# Patient Record
Sex: Female | Born: 1955 | Race: Black or African American | Hispanic: No | Marital: Married | Smoking: Former smoker
Health system: Southern US, Community
[De-identification: ages and names within clinical notes are randomized; demographics above are authoritative.]

## PROBLEM LIST (undated history)

## (undated) DIAGNOSIS — I10 Essential (primary) hypertension: Secondary | ICD-10-CM

## (undated) DIAGNOSIS — H8109 Meniere's disease, unspecified ear: Secondary | ICD-10-CM

## (undated) DIAGNOSIS — J449 Chronic obstructive pulmonary disease, unspecified: Secondary | ICD-10-CM

## (undated) DIAGNOSIS — G4733 Obstructive sleep apnea (adult) (pediatric): Secondary | ICD-10-CM

## (undated) DIAGNOSIS — I4892 Unspecified atrial flutter: Secondary | ICD-10-CM

## (undated) DIAGNOSIS — G4730 Sleep apnea, unspecified: Secondary | ICD-10-CM

## (undated) DIAGNOSIS — E119 Type 2 diabetes mellitus without complications: Secondary | ICD-10-CM

## (undated) DIAGNOSIS — Z860100 Personal history of colon polyps, unspecified: Secondary | ICD-10-CM

## (undated) DIAGNOSIS — C649 Malignant neoplasm of unspecified kidney, except renal pelvis: Secondary | ICD-10-CM

## (undated) DIAGNOSIS — K219 Gastro-esophageal reflux disease without esophagitis: Secondary | ICD-10-CM

## (undated) DIAGNOSIS — J45909 Unspecified asthma, uncomplicated: Secondary | ICD-10-CM

## (undated) HISTORY — PX: BARIATRIC SURGERY: SHX1103

## (undated) HISTORY — DX: Unspecified atrial flutter: I48.92

## (undated) HISTORY — PX: ABDOMINAL HYSTERECTOMY: SHX81

## (undated) HISTORY — PX: CHOLECYSTECTOMY: SHX55

## (undated) HISTORY — DX: Obstructive sleep apnea (adult) (pediatric): G47.33

---

## 2016-08-08 ENCOUNTER — Other Ambulatory Visit: Payer: Self-pay | Admitting: Nurse Practitioner

## 2016-08-08 ENCOUNTER — Ambulatory Visit
Admission: RE | Admit: 2016-08-08 | Discharge: 2016-08-08 | Disposition: A | Discharge status: Home / Self Care | Payer: Medicare Other | Source: Ambulatory Visit | Attending: Nurse Practitioner | Admitting: Nurse Practitioner

## 2016-08-08 DIAGNOSIS — M25561 Pain in right knee: Secondary | ICD-10-CM

## 2016-09-04 ENCOUNTER — Ambulatory Visit: Payer: Medicare Other | Attending: Neurology

## 2016-09-04 DIAGNOSIS — Z79899 Other long term (current) drug therapy: Secondary | ICD-10-CM | POA: Insufficient documentation

## 2016-09-04 DIAGNOSIS — G4733 Obstructive sleep apnea (adult) (pediatric): Secondary | ICD-10-CM | POA: Insufficient documentation

## 2016-09-04 DIAGNOSIS — R0683 Snoring: Secondary | ICD-10-CM | POA: Diagnosis present

## 2016-09-04 DIAGNOSIS — I10 Essential (primary) hypertension: Secondary | ICD-10-CM | POA: Diagnosis not present

## 2016-10-14 ENCOUNTER — Ambulatory Visit: Payer: Self-pay | Admitting: Unknown Physician Specialty

## 2016-12-10 ENCOUNTER — Encounter: Payer: Self-pay | Admitting: *Deleted

## 2016-12-11 ENCOUNTER — Encounter: Payer: Self-pay | Admitting: *Deleted

## 2016-12-11 ENCOUNTER — Ambulatory Visit
Admission: RE | Admit: 2016-12-11 | Discharge: 2016-12-11 | Disposition: A | Discharge status: Home / Self Care | Payer: Medicare Other | Source: Ambulatory Visit | Attending: Unknown Physician Specialty | Admitting: Unknown Physician Specialty

## 2016-12-11 ENCOUNTER — Ambulatory Visit: Payer: Medicare Other | Admitting: Anesthesiology

## 2016-12-11 ENCOUNTER — Encounter
Admission: RE | Disposition: A | Discharge status: Home / Self Care | Payer: Self-pay | Source: Ambulatory Visit | Attending: Unknown Physician Specialty

## 2016-12-11 DIAGNOSIS — D125 Benign neoplasm of sigmoid colon: Secondary | ICD-10-CM | POA: Insufficient documentation

## 2016-12-11 DIAGNOSIS — Z6841 Body Mass Index (BMI) 40.0 and over, adult: Secondary | ICD-10-CM | POA: Insufficient documentation

## 2016-12-11 DIAGNOSIS — I10 Essential (primary) hypertension: Secondary | ICD-10-CM | POA: Diagnosis not present

## 2016-12-11 DIAGNOSIS — D123 Benign neoplasm of transverse colon: Secondary | ICD-10-CM | POA: Insufficient documentation

## 2016-12-11 DIAGNOSIS — Z1211 Encounter for screening for malignant neoplasm of colon: Secondary | ICD-10-CM | POA: Diagnosis not present

## 2016-12-11 DIAGNOSIS — J45909 Unspecified asthma, uncomplicated: Secondary | ICD-10-CM | POA: Insufficient documentation

## 2016-12-11 DIAGNOSIS — K648 Other hemorrhoids: Secondary | ICD-10-CM | POA: Insufficient documentation

## 2016-12-11 DIAGNOSIS — K219 Gastro-esophageal reflux disease without esophagitis: Secondary | ICD-10-CM | POA: Insufficient documentation

## 2016-12-11 DIAGNOSIS — Z7951 Long term (current) use of inhaled steroids: Secondary | ICD-10-CM | POA: Insufficient documentation

## 2016-12-11 DIAGNOSIS — Z86010 Personal history of colonic polyps: Secondary | ICD-10-CM | POA: Insufficient documentation

## 2016-12-11 DIAGNOSIS — E669 Obesity, unspecified: Secondary | ICD-10-CM | POA: Insufficient documentation

## 2016-12-11 DIAGNOSIS — G4730 Sleep apnea, unspecified: Secondary | ICD-10-CM | POA: Diagnosis not present

## 2016-12-11 DIAGNOSIS — Z87891 Personal history of nicotine dependence: Secondary | ICD-10-CM | POA: Insufficient documentation

## 2016-12-11 DIAGNOSIS — Z79899 Other long term (current) drug therapy: Secondary | ICD-10-CM | POA: Diagnosis not present

## 2016-12-11 HISTORY — DX: Essential (primary) hypertension: I10

## 2016-12-11 HISTORY — DX: Personal history of colonic polyps: Z86.010

## 2016-12-11 HISTORY — DX: Meniere's disease, unspecified ear: H81.09

## 2016-12-11 HISTORY — DX: Sleep apnea, unspecified: G47.30

## 2016-12-11 HISTORY — DX: Personal history of colon polyps, unspecified: Z86.0100

## 2016-12-11 HISTORY — DX: Unspecified asthma, uncomplicated: J45.909

## 2016-12-11 HISTORY — DX: Gastro-esophageal reflux disease without esophagitis: K21.9

## 2016-12-11 HISTORY — PX: COLONOSCOPY WITH PROPOFOL: SHX5780

## 2016-12-11 SURGERY — COLONOSCOPY WITH PROPOFOL
Anesthesia: General

## 2016-12-11 MED ORDER — PROPOFOL 10 MG/ML IV BOLUS
INTRAVENOUS | Status: AC
  Filled 2016-12-11: qty 20

## 2016-12-11 MED ORDER — ONDANSETRON HCL 4 MG/2ML IJ SOLN
INTRAMUSCULAR | Status: DC | PRN
  Administered 2016-12-11: 4 mg via INTRAVENOUS

## 2016-12-11 MED ORDER — GLYCOPYRROLATE 0.2 MG/ML IJ SOLN
INTRAMUSCULAR | Status: AC
  Filled 2016-12-11: qty 1

## 2016-12-11 MED ORDER — PROPOFOL 500 MG/50ML IV EMUL
INTRAVENOUS | Status: AC
  Filled 2016-12-11: qty 50

## 2016-12-11 MED ORDER — SODIUM CHLORIDE 0.9 % IV SOLN: INTRAVENOUS | Status: DC

## 2016-12-11 MED ORDER — GLYCOPYRROLATE 0.2 MG/ML IV SOSY
PREFILLED_SYRINGE | INTRAVENOUS | Status: DC | PRN
  Administered 2016-12-11 (×2): .1 mg via INTRAVENOUS

## 2016-12-11 MED ORDER — SODIUM CHLORIDE 0.9 % IV SOLN
INTRAVENOUS | Status: DC
  Administered 2016-12-11: 14:00:00 via INTRAVENOUS

## 2016-12-11 MED ORDER — PROPOFOL 500 MG/50ML IV EMUL
INTRAVENOUS | Status: DC | PRN
  Administered 2016-12-11: 150 ug/kg/min via INTRAVENOUS

## 2016-12-11 MED ORDER — ONDANSETRON HCL 4 MG/2ML IJ SOLN
INTRAMUSCULAR | Status: AC
  Filled 2016-12-11: qty 2

## 2016-12-11 MED ORDER — PROPOFOL 10 MG/ML IV BOLUS
INTRAVENOUS | Status: DC | PRN
  Administered 2016-12-11: 70 mg via INTRAVENOUS
  Administered 2016-12-11: 30 mg via INTRAVENOUS
  Administered 2016-12-11 (×4): 20 mg via INTRAVENOUS
  Administered 2016-12-11 (×2): 30 mg via INTRAVENOUS
  Administered 2016-12-11: 10 mg via INTRAVENOUS
  Administered 2016-12-11: 30 mg via INTRAVENOUS

## 2016-12-11 NOTE — H&P (Signed)
Primary Care Physician:  Elisabeth Cara, NP Primary Gastroenterologist:  Dr. Vira Agar  Pre-Procedure History & Physical: HPI:  Alyssa Leonard is a 61 y.o. female is here for an colonoscopy.   Past Medical History:  Diagnosis Date  . Asthma   . GERD (gastroesophageal reflux disease)   . History of colon polyps   . Hypertension   . Meniere disease   . Sleep apnea    Went for sleep study test in January 2018 "I haven't heard back from test"    Past Surgical History:  Procedure Laterality Date  . ABDOMINAL HYSTERECTOMY    . CHOLECYSTECTOMY      Prior to Admission medications   Medication Sig Start Date End Date Taking? Authorizing Provider  albuterol (PROVENTIL HFA;VENTOLIN HFA) 108 (90 Base) MCG/ACT inhaler Inhale 2 puffs into the lungs every 6 (six) hours as needed for wheezing or shortness of breath.   Yes Historical Provider, MD  amLODipine (NORVASC) 5 MG tablet Take 5 mg by mouth daily.   Yes Historical Provider, MD  losartan-hydrochlorothiazide (HYZAAR) 50-12.5 MG tablet Take 1 tablet by mouth daily.   Yes Historical Provider, MD  meclizine (ANTIVERT) 25 MG tablet Take 25 mg by mouth 3 (three) times daily as needed for dizziness.   Yes Historical Provider, MD  meloxicam (MOBIC) 7.5 MG tablet Take 7.5 mg by mouth daily.   Yes Historical Provider, MD  metoprolol succinate (TOPROL-XL) 25 MG 24 hr tablet Take 25 mg by mouth daily.   Yes Historical Provider, MD  ondansetron (ZOFRAN) 4 MG tablet Take 4 mg by mouth every 8 (eight) hours as needed for nausea or vomiting.   Yes Historical Provider, MD  pantoprazole (PROTONIX) 40 MG tablet Take 40 mg by mouth daily.   Yes Historical Provider, MD    Allergies as of 11/22/2016 - never reviewed  Allergen Reaction Noted  . Aspirin Other (See Comments) 08/07/2016    Family History  Problem Relation Age of Onset  . Hypertension Mother   . Kidney disease Sister   . Kidney disease Brother   . Heart disease Brother   . Heart disease  Brother     Social History   Social History  . Marital status: Single    Spouse name: N/A  . Number of children: N/A  . Years of education: N/A   Occupational History  . Not on file.   Social History Main Topics  . Smoking status: Former Research scientist (life sciences)  . Smokeless tobacco: Never Used  . Alcohol use No  . Drug use: No  . Sexual activity: Not on file   Other Topics Concern  . Not on file   Social History Narrative  . No narrative on file    Review of Systems: See HPI, otherwise negative ROS  Physical Exam: BP (!) 157/95   Pulse 83   Temp 97 F (36.1 C) (Tympanic)   Resp 18   Ht 5' (1.524 m)   Wt (!) 149.7 kg (330 lb)   SpO2 100%   BMI 64.45 kg/m  General:   Alert,  pleasant and cooperative in NAD Head:  Normocephalic and atraumatic. Neck:  Supple; no masses or thyromegaly. Lungs:  Clear throughout to auscultation.    Heart:  Regular rate and rhythm. Abdomen:  Soft, nontender and nondistended. Normal bowel sounds, without guarding, and without rebound.   Neurologic:  Alert and  oriented x4;  grossly normal neurologically.  Impression/Plan: Alyssa Leonard is here for an colonoscopy to be performed  for [facility] colon polyps  Risks, benefits, limitations, and alternatives regarding  colonoscopy have been reviewed with the patient.  Questions have been answered.  All parties agreeable.   Gaylyn Cheers, MD  12/11/2016, 2:56 PM

## 2016-12-11 NOTE — Anesthesia Preprocedure Evaluation (Addendum)
Anesthesia Evaluation  Patient identified by MRN, date of birth, ID band Patient awake    Reviewed: Allergy & Precautions, NPO status , Patient's Chart, lab work & pertinent test results, reviewed documented beta blocker date and time   Airway Mallampati: II  TM Distance: >3 FB     Dental  (+) Chipped, Caps   Pulmonary asthma , sleep apnea , former smoker,    Pulmonary exam normal        Cardiovascular hypertension, Pt. on medications and Pt. on home beta blockers Normal cardiovascular exam     Neuro/Psych negative psych ROS   GI/Hepatic Neg liver ROS, GERD  Medicated and Controlled,  Endo/Other  negative endocrine ROS  Renal/GU negative Renal ROS  negative genitourinary   Musculoskeletal negative musculoskeletal ROS (+)   Abdominal (+) + obese,   Peds negative pediatric ROS (+)  Hematology negative hematology ROS (+)   Anesthesia Other Findings   Reproductive/Obstetrics negative OB ROS                            Anesthesia Physical Anesthesia Plan  ASA: III  Anesthesia Plan: General   Post-op Pain Management:    Induction: Intravenous  Airway Management Planned: Nasal Cannula  Additional Equipment:   Intra-op Plan:   Post-operative Plan:   Informed Consent: I have reviewed the patients History and Physical, chart, labs and discussed the procedure including the risks, benefits and alternatives for the proposed anesthesia with the patient or authorized representative who has indicated his/her understanding and acceptance.   Dental advisory given  Plan Discussed with: CRNA and Surgeon  Anesthesia Plan Comments:         Anesthesia Quick Evaluation

## 2016-12-11 NOTE — Transfer of Care (Signed)
Immediate Anesthesia Transfer of Care Note  Patient: Alyssa Leonard  Procedure(s) Performed: Procedure(s): COLONOSCOPY WITH PROPOFOL (N/A)  Patient Location: PACU  Anesthesia Type:General  Level of Consciousness: awake, alert  and oriented  Airway & Oxygen Therapy: Patient Spontanous Breathing and Patient connected to nasal cannula oxygen  Post-op Assessment: Report given to RN and Post -op Vital signs reviewed and stable  Post vital signs: Reviewed and stable  Last Vitals:  Vitals:   12/11/16 1355 12/11/16 1538  BP: (!) 157/95 120/77  Pulse: 83 (!) 107  Resp: 18 19  Temp: 36.1 C (!) 35.9 C    Last Pain:  Vitals:   12/11/16 1538  TempSrc: Tympanic  PainSc:          Complications: No apparent anesthesia complications

## 2016-12-11 NOTE — Op Note (Signed)
[facility] Patient Name: Alyssa Leonard Procedure Date: 12/11/2016 2:50 PM MRN: [id number] Account #: [id number] Date of Birth: Mar 12, 1956 Admit Type: Outpatient Age: 61 Room: [facility] ENDO ROOM 4 Gender: Female Note Status: Finalized Procedure:            Colonoscopy Indications:          High risk colon cancer surveillance: Personal history                        of colonic polyps Providers:            Manya Silvas, MD Medicines:            Propofol per Anesthesia Complications:        No immediate complications. Procedure:            Pre-Anesthesia Assessment:                       - After reviewing the risks and benefits, the patient                        was deemed in satisfactory condition to undergo the                        procedure.                       After obtaining informed consent, the colonoscope was                        passed under direct vision. Throughout the procedure,                        the patient's blood pressure, pulse, and oxygen                        saturations were monitored continuously. The                        Colonoscope was introduced through the anus and                        advanced to the the cecum, identified by appendiceal                        orifice and ileocecal valve. The patient tolerated the                        procedure well. The quality of the bowel preparation                        was good. Findings:      A 15 mm polyp was found in the proximal transverse colon. The polyp was       pedunculated. The polyp was removed with a hot snare. Resection and       retrieval were complete. Two clips applied to the site.      A diminutive polyp was found in the recto-sigmoid colon. The polyp was       sessile. The polyp was removed with a jumbo cold forceps. Resection and       retrieval were complete. Impression:           -  One 15 mm polyp in the proximal transverse colon,            removed with a hot snare. Resected and retrieved.                       - One diminutive polyp at the recto-sigmoid colon,                        removed with a jumbo cold forceps. Resected and                        retrieved. Recommendation:       - Await pathology results. Manya Silvas, MD 12/11/2016 3:37:59 PM This report has been signed electronically. Number of Addenda: 0 Note Initiated On: 12/11/2016 2:50 PM Scope Withdrawal Time: 0 hours 22 minutes 17 seconds  Total Procedure Duration: 0 hours 26 minutes 53 seconds       [facility]

## 2016-12-11 NOTE — Anesthesia Postprocedure Evaluation (Signed)
Anesthesia Post Note  Patient: Alyssa Leonard  Procedure(s) Performed: Procedure(s) (LRB): COLONOSCOPY WITH PROPOFOL (N/A)  Patient location during evaluation: Endoscopy Anesthesia Type: General Level of consciousness: awake and alert and oriented Pain management: pain level controlled Vital Signs Assessment: post-procedure vital signs reviewed and stable Respiratory status: spontaneous breathing, nonlabored ventilation and respiratory function stable Cardiovascular status: blood pressure returned to baseline and stable Postop Assessment: no signs of nausea or vomiting Anesthetic complications: no     Last Vitals:  Vitals:   12/11/16 1558 12/11/16 1608  BP: (!) 136/109 (!) 136/113  Pulse: (!) 104 98  Resp: 17 17  Temp:      Last Pain:  Vitals:   12/11/16 1538  TempSrc: Tympanic  PainSc:                  Sybol Morre

## 2016-12-11 NOTE — Anesthesia Post-op Follow-up Note (Cosign Needed)
Anesthesia QCDR form completed.        

## 2016-12-11 NOTE — Anesthesia Procedure Notes (Signed)
Date/Time: 12/11/2016 3:00 PM Performed by: Hedda Slade Pre-anesthesia Checklist: Patient identified, Emergency Drugs available, Suction available and Patient being monitored Patient Re-evaluated:Patient Re-evaluated prior to inductionOxygen Delivery Method: Nasal cannula

## 2016-12-12 ENCOUNTER — Encounter: Payer: Self-pay | Admitting: Unknown Physician Specialty

## 2016-12-13 LAB — SURGICAL PATHOLOGY

## 2017-05-20 ENCOUNTER — Emergency Department
Admission: EM | Admit: 2017-05-20 | Discharge: 2017-05-20 | Disposition: A | Discharge status: Home / Self Care | Payer: Medicare Other | Attending: Emergency Medicine | Admitting: Emergency Medicine

## 2017-05-20 ENCOUNTER — Encounter: Payer: Self-pay | Admitting: *Deleted

## 2017-05-20 ENCOUNTER — Emergency Department: Payer: Medicare Other

## 2017-05-20 DIAGNOSIS — M170 Bilateral primary osteoarthritis of knee: Secondary | ICD-10-CM | POA: Diagnosis not present

## 2017-05-20 DIAGNOSIS — G5603 Carpal tunnel syndrome, bilateral upper limbs: Secondary | ICD-10-CM | POA: Diagnosis not present

## 2017-05-20 DIAGNOSIS — R200 Anesthesia of skin: Secondary | ICD-10-CM | POA: Diagnosis present

## 2017-05-20 MED ORDER — PREDNISONE 20 MG PO TABS
60.0000 mg | ORAL_TABLET | Freq: Once | ORAL | Status: AC
  Administered 2017-05-20: 60 mg via ORAL
  Filled 2017-05-20: qty 3

## 2017-05-20 MED ORDER — PREDNISONE 10 MG PO TABS: ORAL_TABLET | ORAL | 0 refills | Status: DC

## 2017-05-20 NOTE — ED Provider Notes (Signed)
[facility] Emergency Department Provider Note  ____________________________________________  Time seen: Approximately 1:52 PM  I have reviewed the triage vital signs and the nursing notes.   HISTORY  Chief Complaint Knee Pain and Numbness    HPI Alyssa Leonard is a 61 y.o. female who presents to the emergency department with increased knee pain for 2 weeks and numbness and tingling in bilateral hands for one week.Patient has arthritis in both knees. Pain is worse in the left knee and right and is worse with walking. No trauma. She takes meloxicam for pain and did not take any today. Numbness and tingling in her fingers started 1 week ago. Pain is worse in her left hand than the right hand. It is worse with grabbing and handling objects. It wakes her up at night. 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 She denies neck pain, neck injury, shortness breath, chest pain, palpitations, nausea, vomiting, abdominal pain.   Past Medical History:  Diagnosis Date  . Asthma   . GERD (gastroesophageal reflux disease)   . History of colon polyps   . Hypertension   . Meniere disease   . Sleep apnea    Went for sleep study test in January 2018 "I haven't heard back from test"    There are no active problems to display for this patient.   Past Surgical History:  Procedure Laterality Date  . ABDOMINAL HYSTERECTOMY    . CHOLECYSTECTOMY    . COLONOSCOPY WITH PROPOFOL N/A 12/11/2016   Procedure: COLONOSCOPY WITH PROPOFOL;  Surgeon: Manya Silvas, MD;  Location: [facility] ENDOSCOPY;  Service: Endoscopy;  Laterality: N/A;    Prior to Admission medications   Medication Sig Start Date End Date Taking? Authorizing Provider  albuterol (PROVENTIL HFA;VENTOLIN HFA) 108 (90 Base) MCG/ACT inhaler Inhale 2 puffs into the lungs every 6 (six) hours as needed for wheezing or shortness of breath.    [provider]  amLODipine  (NORVASC) 5 MG tablet Take 5 mg by mouth daily.    [provider]  losartan-hydrochlorothiazide (HYZAAR) 50-12.5 MG tablet Take 1 tablet by mouth daily.    [provider]  meclizine (ANTIVERT) 25 MG tablet Take 25 mg by mouth 3 (three) times daily as needed for dizziness.    [provider]  meloxicam (MOBIC) 7.5 MG tablet Take 7.5 mg by mouth daily.    [provider]  metoprolol succinate (TOPROL-XL) 25 MG 24 hr tablet Take 25 mg by mouth daily.    [provider]  ondansetron (ZOFRAN) 4 MG tablet Take 4 mg by mouth every 8 (eight) hours as needed for nausea or vomiting.    [provider]  pantoprazole (PROTONIX) 40 MG tablet Take 40 mg by mouth daily.    [provider]  predniSONE (DELTASONE) 10 MG tablet Take 6 tablets on day 1, take 5 tablets on day 2, take 4 tablets on day 3, take 3 tablets on day 4, take 2 tablets on day 5, take 1 tablet on day 6 05/20/17   Alyssa Emperor, PA-C    Allergies Aspirin  Family History  Problem Relation Age of Onset  . Hypertension Mother   . Kidney disease Sister   . Kidney disease Brother   . Heart disease Brother   . Heart disease Brother     Social History Social History  Substance Use Topics  . Smoking status: Former Research scientist (life sciences)  . Smokeless tobacco:  Never Used  . Alcohol use No     Review of Systems  Constitutional: No fever/chills Cardiovascular: No chest pain. Respiratory: No SOB. Gastrointestinal: No abdominal pain.  No nausea, no vomiting.  Musculoskeletal: Positive for knee pain. Skin: Negative for rash, abrasions, lacerations, ecchymosis. Neurological: Negative for headaches. Positive for numbness and tingling in hands.   ____________________________________________   PHYSICAL EXAM:  VITAL SIGNS: ED Triage Vitals  Enc Vitals Group     BP 05/20/17 1321 (!) 144/88     Pulse Rate 05/20/17 1321 68     Resp 05/20/17 1321 (!) 46     Temp 05/20/17 1321 98.4 F  (36.9 C)     Temp Source 05/20/17 1321 Oral     SpO2 05/20/17 1321 96 %     Weight 05/20/17 1311 (!) 319 lb (144.7 kg)     Height 05/20/17 1311 5' (1.524 m)     Head Circumference --      Peak Flow --      Pain Score 05/20/17 1311 7     Pain Loc --      Pain Edu? --      Excl. in [place]? --      Constitutional: Alert and oriented. Well appearing and in no acute distress. Eyes: Conjunctivae are normal. PERRL. EOMI. Head: Atraumatic. ENT:      Ears:      Nose: No congestion/rhinnorhea.      Mouth/Throat: Mucous membranes are moist.  Neck: No stridor.   Cardiovascular: Normal rate, regular rhythm.  Good peripheral circulation. 2+ radial pulses. Respiratory: Normal respiratory effort without tachypnea or retractions. Lungs CTAB. Good air entry to the bases with no decreased or absent breath sounds. Musculoskeletal: Full range of motion to all extremities. No gross deformities appreciated. Positive Tinel's and Phalens. No obvious swelling of knees. No erythema. Diffuse tenderness to palpation over left knee. Negative anterior drawer, posterior drawer, valgus, varus, mcMurray, patella apprehension, apley grind. Neurologic:  Normal speech and language. No gross focal neurologic deficits are appreciated.  Skin:  Skin is warm, dry and intact. No rash noted.   ____________________________________________   LABS (all labs ordered are listed, but only abnormal results are displayed)  Labs Reviewed - No data to display ____________________________________________  EKG   ____________________________________________  RADIOLOGY Robinette Haines, personally viewed and evaluated these images (plain radiographs) as part of my medical decision making, as well as reviewing the written report by the radiologist.  Dg Knee Complete 4 Views Left  Result Date: 05/20/2017 CLINICAL DATA:  Chronic knee pain increasing over the last 2 weeks, initial encounter EXAM: LEFT KNEE - COMPLETE 4+ VIEW  COMPARISON:  None. FINDINGS: Degenerative changes of the left knee joint are noted. These changes are most prominent in the medial joint space. No sizable joint effusion is seen. No acute fracture or dislocation is noted. IMPRESSION: Degenerative changes without acute abnormality. Electronically Signed   By: Inez Catalina M.D.   On: 05/20/2017 14:12    ____________________________________________    PROCEDURES  Procedure(s) performed:    Procedures    Medications  predniSONE (DELTASONE) tablet 60 mg (60 mg Oral Given 05/20/17 1531)     ____________________________________________   INITIAL IMPRESSION / ASSESSMENT AND PLAN / ED COURSE  Pertinent labs & imaging results that were available during my care of the patient were reviewed by me and considered in my medical decision making (see chart for details).  Review of the [place] CSRS was performed in accordance of the [facility]  prior to dispensing any controlled drugs.   Patient's diagnosis is consistent with osteoarthritis and carpal tunnel. Vital signs and exam are reassuring. She has worsening bilateral knee pain. Left knee x-ray negative for acute bony abnormalities. It is consistent with osteoarthritis. No trauma. Patient is obese and would benefit from losing weight. Hand numbness and tingling is likely carpal tunnel. 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 She has not taken any of her medication today. Patient will be discharged home with prescriptions for prednisone. She is not diabetic. Patient is to follow up with orthopedics as directed. Patient is given ED precautions to return to the ED for any worsening or new symptoms.     ____________________________________________  FINAL CLINICAL IMPRESSION(S) / ED DIAGNOSES  Final diagnoses:  Bilateral carpal tunnel syndrome  Osteoarthritis of both knees, unspecified osteoarthritis type      NEW MEDICATIONS STARTED DURING THIS VISIT:  Discharge  Medication List as of 05/20/2017  3:20 PM    START taking these medications   Details  predniSONE (DELTASONE) 10 MG tablet Take 6 tablets on day 1, take 5 tablets on day 2, take 4 tablets on day 3, take 3 tablets on day 4, take 2 tablets on day 5, take 1 tablet on day 6, Print            This chart was dictated using voice recognition software/Dragon. Despite best efforts to proofread, errors can occur which can change the meaning. Any change was purely unintentional.    Alyssa Emperor, PA-C 05/20/17 1639    Lavonia Drafts, MD 06/03/17 [phone]

## 2017-05-20 NOTE — ED Triage Notes (Signed)
Pt reports having bilateral chronic knee pain from arthritis that has worsened over the past 2 weeks. Pt reports feeling as though knees are swollen. PT able to bear weight but reports increased pain when doing so. Movement intact. No injury reported.  Pt also reports having intermittent numbness in all finger tips for the past 2 weeks as well. Movement intact. Pulse strong, sensation intact. Pt denies pain or weakness.

## 2017-05-20 NOTE — ED Notes (Signed)
+  2 pulses bilateral feet. Takes maloxicam for pain bid. Denies injury. Able to bear weight.

## 2017-10-08 ENCOUNTER — Other Ambulatory Visit: Payer: Self-pay | Admitting: Nurse Practitioner

## 2017-10-08 DIAGNOSIS — Z1231 Encounter for screening mammogram for malignant neoplasm of breast: Secondary | ICD-10-CM

## 2017-11-10 ENCOUNTER — Ambulatory Visit
Admission: RE | Admit: 2017-11-10 | Discharge: 2017-11-10 | Disposition: A | Discharge status: Home / Self Care | Payer: Medicare HMO | Source: Ambulatory Visit | Attending: Nurse Practitioner | Admitting: Nurse Practitioner

## 2017-11-10 DIAGNOSIS — Z1231 Encounter for screening mammogram for malignant neoplasm of breast: Secondary | ICD-10-CM

## 2017-11-17 ENCOUNTER — Inpatient Hospital Stay
Admission: RE | Admit: 2017-11-17 | Discharge: 2017-11-17 | Disposition: A | Discharge status: Home / Self Care | Payer: Self-pay | Source: Ambulatory Visit | Attending: *Deleted | Admitting: *Deleted

## 2017-11-17 ENCOUNTER — Other Ambulatory Visit: Payer: Self-pay | Admitting: *Deleted

## 2017-11-17 DIAGNOSIS — Z9289 Personal history of other medical treatment: Secondary | ICD-10-CM

## 2017-12-05 DIAGNOSIS — Z6841 Body Mass Index (BMI) 40.0 and over, adult: Secondary | ICD-10-CM | POA: Insufficient documentation

## 2018-01-22 DIAGNOSIS — Z91040 Latex allergy status: Secondary | ICD-10-CM | POA: Insufficient documentation

## 2018-02-25 DIAGNOSIS — R7301 Impaired fasting glucose: Secondary | ICD-10-CM | POA: Insufficient documentation

## 2018-06-11 ENCOUNTER — Other Ambulatory Visit: Payer: Self-pay | Admitting: Ophthalmology

## 2018-06-11 DIAGNOSIS — H4720 Unspecified optic atrophy: Secondary | ICD-10-CM

## 2018-06-26 ENCOUNTER — Ambulatory Visit
Admission: RE | Admit: 2018-06-26 | Discharge: 2018-06-26 | Disposition: A | Discharge status: Home / Self Care | Payer: Medicare HMO | Source: Ambulatory Visit | Attending: Ophthalmology | Admitting: Ophthalmology

## 2018-06-26 DIAGNOSIS — H4720 Unspecified optic atrophy: Secondary | ICD-10-CM | POA: Insufficient documentation

## 2018-06-26 LAB — POCT I-STAT CREATININE: Creatinine, Ser: 1 mg/dL (ref 0.44–1.00)

## 2018-06-26 MED ORDER — GADOBENATE DIMEGLUMINE 529 MG/ML IV SOLN
20.0000 mL | Freq: Once | INTRAVENOUS | Status: AC | PRN
  Administered 2018-06-26: 20 mL via INTRAVENOUS

## 2018-07-21 DIAGNOSIS — H8113 Benign paroxysmal vertigo, bilateral: Secondary | ICD-10-CM | POA: Insufficient documentation

## 2018-08-07 ENCOUNTER — Ambulatory Visit: Payer: Medicare HMO

## 2018-08-11 ENCOUNTER — Ambulatory Visit: Payer: Medicare HMO | Admitting: Physical Therapy

## 2018-08-13 ENCOUNTER — Other Ambulatory Visit: Payer: Self-pay

## 2018-08-13 ENCOUNTER — Encounter: Payer: Self-pay | Admitting: Physical Therapy

## 2018-08-13 ENCOUNTER — Ambulatory Visit: Payer: Medicare HMO | Attending: Neurology | Admitting: Physical Therapy

## 2018-08-13 DIAGNOSIS — H8111 Benign paroxysmal vertigo, right ear: Secondary | ICD-10-CM | POA: Diagnosis present

## 2018-08-13 DIAGNOSIS — R42 Dizziness and giddiness: Secondary | ICD-10-CM | POA: Diagnosis not present

## 2018-08-13 NOTE — Therapy (Signed)
[place] MAIN [facility] SERVICES [address] [place], [place], [address] Phone: [phone]   Fax:  [phone]  Physical Therapy Evaluation  Patient Details  Name: Alyssa Leonard MRN: [id number] Date of Birth: September 06, 1956 Referring Provider (PT): Dr. Melrose Nakayama   Encounter Date: 08/13/2018  PT End of Session - 08/17/18 1258    Visit Number  1    Number of Visits  9    Date for PT Re-Evaluation  10/08/18    PT Start Time  1400    PT Stop Time  1450    PT Time Calculation (min)  50 min    Activity Tolerance  Other (comment)   limited by nausea and vertigo   Behavior During Therapy  [facility] for tasks assessed/performed       Past Medical History:  Diagnosis Date  . Asthma   . GERD (gastroesophageal reflux disease)   . History of colon polyps   . Hypertension   . Meniere disease   . Sleep apnea    Went for sleep study test in January 2018 "I haven't heard back from test"    Past Surgical History:  Procedure Laterality Date  . ABDOMINAL HYSTERECTOMY    . CHOLECYSTECTOMY    . COLONOSCOPY WITH PROPOFOL N/A 12/11/2016   Procedure: COLONOSCOPY WITH PROPOFOL;  Surgeon: Manya Silvas, MD;  Location: [facility] ENDOSCOPY;  Service: Endoscopy;  Laterality: N/A;    There were no vitals filed for this visit.   Subjective Assessment - 08/17/18 1256    Subjective  Patient states she is dizzy when she moves.     Pertinent History  Patient reports her dizziness is constant when moving. Patient states when she is moving she has dizziness but it subsides when she is sitting. Patient states "everything is on the right side". Patient states she cannot sleep on her right side. Patient states she gets dizzy when she looks to the right. Patient reports she is not able to lie down flat due to her dizziness. Patient reports she gets vertigo when she turns on her right side. Patient states the vertigo lasts about 3 minutes and then she has nausea and vomiting. Patient  states she feels likes she is "falling into a hole and everything is spinning as I am falling". Patient reports that turning head to right and rolling to right, bending over, lying down and sitting back up, making the bed, sweeping, setting her hair in rollers are movements that bring on her dizziness. Patient states she was told she had Meniere's disease in the past, but the ENT doctor a few months ago and he said she saw here said she does not have Meniere's disease. 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 Patient states she went for a contrast MRI recently but states she was not sure what the results were for the test. Patient reports she has been seen by a neurologist and Dr. Melrose Nakayama reviewed patient for vestibular therapy. Patient states she is afraid of driving because the dizziness comes on suddenly without warning. Patient states they did not treat the BPPV in the office. Patient states the dizziness first started 10 years ago. Patient states her dizziness now comes on even when she is not moving her body. Patient reports at baseline she is a limited ambulator and that she uses a cane when her arthritis flairs due to pain. Patient states that her right knee buckles  and pops at times and that she tried wearing a right knee brace.     Diagnostic tests  Per MR, MRI performed on 06/26/2018 revealed 1. 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 No underlying cause is seen. 2. 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 High-resolution skull base CT could further evaluate bony anatomy if clinically needed. Is there clinical indication of pseudotumor?    Patient Stated Goals  to have decreased dizziness, would like to be able to bend over and sleep on her right side without dizziness, be able to pick up object off the floor without nausea        [facility] PT Assessment -  08/17/18 1319      Assessment   Medical Diagnosis  BPPV    Referring Provider (PT)  Dr. Melrose Nakayama    Prior Therapy  no prior vestibular therapy      Precautions   Precautions  Fall      Restrictions   Weight Bearing Restrictions  No      Balance Screen   Has the patient fallen in the past 6 months  No    Has the patient had a decrease in activity level because of a fear of falling?   Yes    Is the patient reluctant to leave their home because of a fear of falling?   No      Home Film/video editor residence    Living Arrangements  Spouse/significant other    Available Help at Discharge  Friend(s)    Type of [place] Access  Level entry    [place] - single point      Prior Function   Level of [place] with household mobility with device   patient states she is not able to walk much due to arthritis     Cognition   Overall Cognitive Status  Within Functional Limits for tasks assessed        VESTIBULAR AND BALANCE EVALUATION   HISTORY:  Subjective history of current problem: Patient reports her dizziness is constant when moving. Patient states when she is moving she has dizziness but it subsides when she is sitting. Patient states "everything is on the right side". Patient states she cannot sleep on her right side. Patient states she gets dizzy when she looks to the right. Patient reports she is not able to lie down flat due to her dizziness. Patient reports she gets vertigo when she turns on her right side. Patient states the vertigo lasts about 3 minutes and then she has nausea and vomiting. Patient states she feels likes she is "falling into a hole and everything is spinning as I am falling". Patient reports that turning head to right and rolling to right, bending over, lying down and sitting back up, making the bed, sweeping, setting her hair in rollers are movements that bring on her  dizziness. Patient states she was told she had Meniere's disease in the past, but the ENT doctor a few months ago and he said she saw here said she does not have Meniere's disease. 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 Patient states she went for a contrast MRI recently but states she was not sure what the results were for the test.  Patient reports she has been seen by a neurologist and Dr. Melrose Nakayama reviewed patient for vestibular therapy. Patient states she is afraid of driving because the dizziness comes on suddenly without warning. Patient states they did not treat the BPPV in the office. Patient states the dizziness first started 10 years ago. Patient states her dizziness now comes on even when she is not moving her body. Patient reports at baseline she is a limited ambulator and that she uses a cane when her arthritis flairs due to pain. Patient states that her right knee buckles and pops at times and that she tried wearing a right knee brace.   Description of dizziness: vertigo, unsteadiness Frequency: daily Duration: constant when she is moving  Symptom nature: motion provoked, positional, constant  Provocative Factors: turning head to right and rolling to right, bending over, lying down and sitting back up, making the bed, sweeping, setting her hair in rollers Easing Factors: propped up lying down or sitting   Progression of symptoms: worse  Falls (yes/no): no Number of falls in past 6 months: none  Auditory complaints (tinnitus, pain, drainage): denies pain, but reports tinnitus in right ear Vision (last eye exam, diplopia, recent changes): glasses; patient has glaucoma; patient reports she is taking eye drops; patient went to the eye doctor a few months ago. Eye doctor recommended that she see a specialist and recommended seeing a neurologist and sent her for the brain scan per pt report.   Current Symptoms: (dysarthria,  dysphagia, drop attacks, bowel and bladder changes, recent weight loss/gain)  Review of systems negative for red flags.     EXAMINATION   MUSCULOSKELETAL SCREEN: Cervical Spine ROM: cervical AROM WFL; patient reports mild dizziness with extension  MMT: NT this date  Functional Mobility: Patient required CGA with transfer to/from mat table to chair.   Gait: Patient arrives in a Styxis chair. Patient ambulated a few steps from Styxis chair to/from the mat table with stooped posture reaching for support surfaces with significantly decreased step length and height.   Balance: Not tested this date secondary to patient became nauseous after Dix-Hallpike testing  POSTURAL CONTROL TESTS:  Clinical Test of Sensory Interaction for Balance (CTSIB):TBA   OCULOMOTOR / VESTIBULAR TESTING: Oculomotor Exam- Room Light  Normal Abnormal Comments  Ocular Alignment N    Ocular ROM N    Spontaneous Nystagmus N    End-Gaze Nystagmus N    Smooth Pursuit N   recreated dizziness  Saccades N  One hypometric saccade with left field testing  VOR   Deferred this date  VOR Cancellation   Deferred this date  Left Head Thrust   Deferred this date  Right Head Thrust   Deferred this date   BPPV TESTS:  Symptoms Duration Intensity Nystagmus  L Dix-Hallpike None   None observed  R Dix-Hallpike vertigo Patient unable to tolerate vertigo and needed to sit up 10/10 Observed 2 beats of nystagmus but patient unable to stay in position secondary to nausea    FUNCTIONAL OUTCOME MEASURES:  Results Comments  DHI 66/100 Moderate perception of handicap; in need of intervention  ABC Scale 43.1% High falls risk; in need of intervention  DGI TBA Patient unable to attempt this date      PT Education - 08/17/18 1256    Education Details  discussed plan of care, Dix-hallpike test, BPPV and treatment; showed YouTube video of positive BPPV nystagmus and canalith repositioning maneuver; provided educational materials  about BPPV from [email or web address].org website  Person(s) Educated  Patient;Spouse    Methods  Explanation;Demonstration;Handout    Comprehension  Verbalized understanding       PT Short Term Goals - 08/17/18 1313      PT SHORT TERM GOAL #1   Title  Patient will report 50% or greater improvement in her symptoms of dizziness and imbalance with provoking motions or positions in order to have improved quaility of life.     Baseline  reports 10/10 dizziness in right Dix-Hallpike test position    Time  4    Period  Weeks    Status  New    Target Date  09/10/18        PT Long Term Goals - 08/17/18 1312      PT LONG TERM GOAL #1   Title  Patient will reduce perceived disability to low levels as indicated by <40 on Dizziness Handicap Inventory.    Baseline  scored 66/100 moderate perception of handicap on 08/13/18    Time  8    Period  Weeks    Status  New    Target Date  10/08/18      PT LONG TERM GOAL #2   Title  Patient reports 75% reduction in her dizziness and nausea levels with provoking motions or positions such as bending over so that patient can pick up objects off the floor.    Time  8    Period  Weeks    Status  New    Target Date  10/08/18      PT LONG TERM GOAL #3   Title  Patient will reduce falls risk as indicated by Activities Specific Balance Confidence Scale (ABC) >67%.    Baseline  scored 43.1% on 08/13/18    Time  8    Period  Weeks    Status  New    Target Date  10/08/18      PT LONG TERM GOAL #4   Title  Patient will be able to sleep on her right side without dizziness symptoms.    Time  8    Period  Weeks    Status  New    Target Date  10/08/18          Plan - 08/20/18 1507    Clinical Impression Statement  Patient with potentially positive right Dix-Hallpike test this date, but patient unable to stay in test position for more than a few seconds secondary to onset of 10/10vertigo and nausea. Patient reported that she had an brain MRI test several  weeks ago but she was not sure of the test results. Patient was then seen by Dr. Melrose Nakayama and referred for vestibular therapy. 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 Upon review of this information and given patient's response to right Dix-Hallpike test position, will contact Dr. Lannie Fields office before attempting further vestibular therapy as patient might benefit from further medical work-up and evaluation.      History and Personal Factors relevant to plan of care:  age, chronicity of problem, comorbidities: meniere's disease, HTN    Clinical Presentation  Evolving    Clinical Decision Making  Moderate    Rehab Potential  Fair    PT Frequency  1x / week    PT Duration  8 weeks    PT Treatment/Interventions  Canalith Repostioning;Neuromuscular re-education;Balance training;Vestibular;Patient/family education;Therapeutic exercise;Gait training;Stair training;Functional mobility training;Therapeutic  activities    PT Next Visit Plan  attempt canalith repositioning manuever if patient able to tolerate; finish evaluation components such as balance and strength testing    Consulted and Agree with Plan of Care  Patient       Patient will benefit from skilled therapeutic intervention in order to improve the following deficits and impairments:  Cardiopulmonary status limiting activity, Dizziness, Decreased endurance, Decreased activity tolerance, Decreased balance, Decreased mobility, Difficulty walking  Visit Diagnosis: Dizziness and giddiness  BPPV (benign paroxysmal positional vertigo), right     Problem List There are no active problems to display for this patient.  Lady Deutscher PT, DPT [phone] Lady Deutscher 08/17/2018, 7:45 PM  [place] MAIN [facility] SERVICES [address] [place],  [place], [address] Phone: [phone]   Fax:  [phone]  Name: Alyssa Leonard MRN: [id number] Date of Birth: [birth year]

## 2018-08-25 ENCOUNTER — Ambulatory Visit: Payer: Medicare HMO | Admitting: Physical Therapy

## 2018-09-01 ENCOUNTER — Ambulatory Visit: Payer: Medicare HMO | Admitting: Physical Therapy

## 2018-09-08 ENCOUNTER — Encounter: Payer: Medicare HMO | Admitting: Physical Therapy

## 2018-09-15 ENCOUNTER — Encounter: Payer: Medicare HMO | Admitting: Physical Therapy

## 2018-09-22 ENCOUNTER — Encounter: Payer: Medicare HMO | Admitting: Physical Therapy

## 2018-09-29 ENCOUNTER — Encounter: Payer: Medicare HMO | Admitting: Physical Therapy

## 2018-11-30 ENCOUNTER — Other Ambulatory Visit: Payer: Self-pay | Admitting: Nurse Practitioner

## 2018-11-30 DIAGNOSIS — Z1231 Encounter for screening mammogram for malignant neoplasm of breast: Secondary | ICD-10-CM

## 2018-12-18 ENCOUNTER — Ambulatory Visit
Admission: RE | Admit: 2018-12-18 | Discharge: 2018-12-18 | Disposition: A | Discharge status: Home / Self Care | Payer: Medicare HMO | Source: Ambulatory Visit | Attending: Nurse Practitioner | Admitting: Nurse Practitioner

## 2018-12-18 DIAGNOSIS — Z1231 Encounter for screening mammogram for malignant neoplasm of breast: Secondary | ICD-10-CM | POA: Diagnosis present

## 2019-10-05 IMAGING — MR MR HEAD WO/W CM
18 series · 48 of 48 positions shown · IV contrast (multihance)
Comparison: None.

CLINICAL DATA: Loss of vision in the left eye for 3-4 months.

EXAM:
MRI HEAD AND ORBITS WITHOUT AND WITH CONTRAST
TECHNIQUE: Multiplanar, multiecho pulse sequences of the brain and surrounding
structures were obtained without and with intravenous contrast.
Multiplanar, multiecho pulse sequences of the orbits and surrounding
structures were obtained including fat saturation techniques, before
and after intravenous contrast administration.
CONTRAST:  20mL MULTIHANCE GADOBENATE DIMEGLUMINE 529 MG/ML IV SOLN

[Series 3: DWI · axial · 3.0mm · 1.20mm/px · z∈[-75,+86]mm · 4 of 55 slices shown (1 of 4)]
[im 1/55]
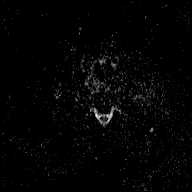
[im 19/55]
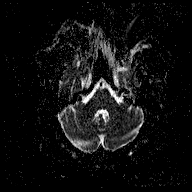
[im 37/55]
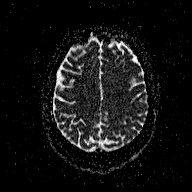
[im 55/55]
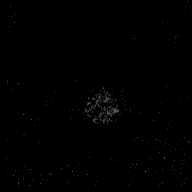

[Series 5: DWI · coronal · 3.0mm · 1.15mm/px · 2 of 45 slices shown (2 of 4)]
[im 1/45]
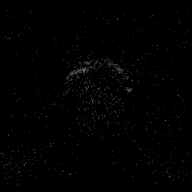
[im 45/45]
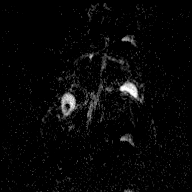

[Series 6: T2 · axial · 5.0mm · 0.72mm/px · 1 of 23 slices shown (1 of 2)]
[im 1/23]
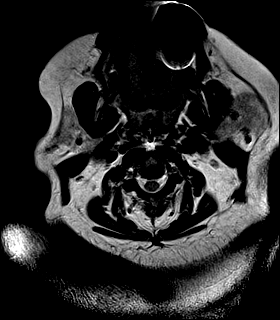

[Series 7: FLAIR · axial · 3.0mm · 0.45mm/px · z∈[-76,+86]mm · 3 of 55 slices shown]
[im 1/55]
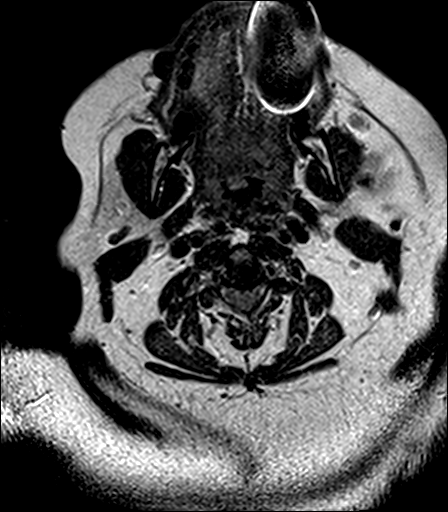
[im 28/55]
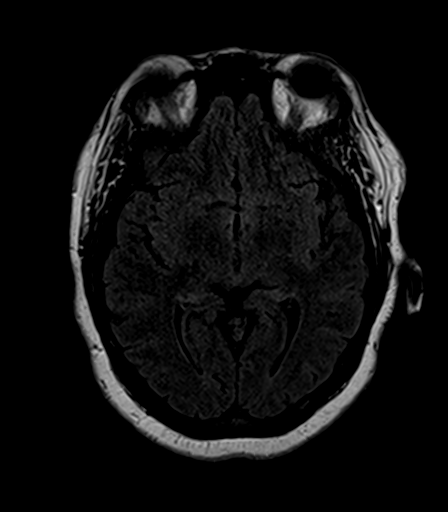
[im 55/55]
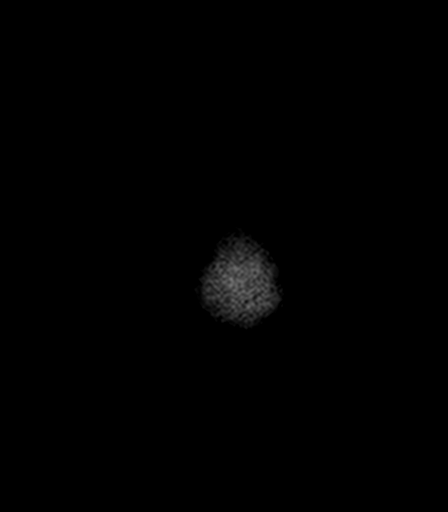

[Series 8: T1 · sagittal · 5.0mm · 0.90mm/px · 1 of 23 slices shown (1 of 4)]
[im 1/23]
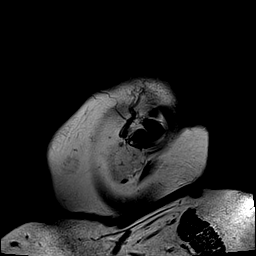

[Series 9: T2 · axial · 5.0mm · 0.72mm/px · 1 of 23 slices shown (2 of 2)]
[im 1/23]
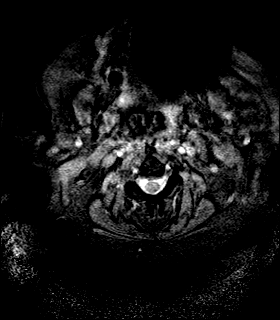

[Series 10: T1 · axial · 1.0mm · 1.00mm/px · z∈[-73,+85]mm · 9 of 160 slices shown (2 of 4)]
[im 1/160]
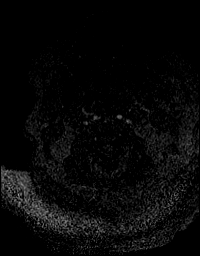
[im 20/160]
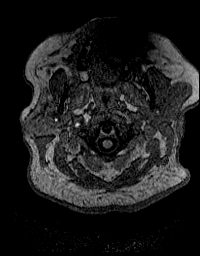
[im 40/160]
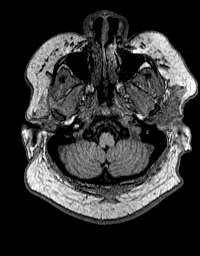
[im 60/160]
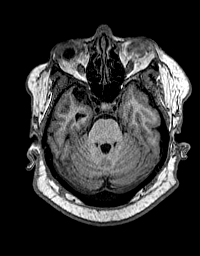
[im 80/160]
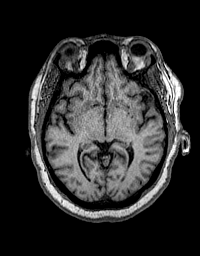
[im 100/160]
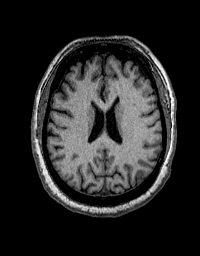
[im 120/160]
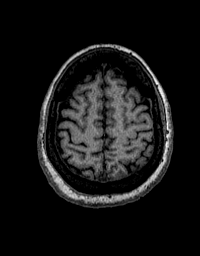
[im 140/160]
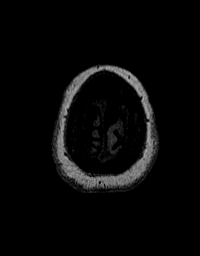
[im 160/160]
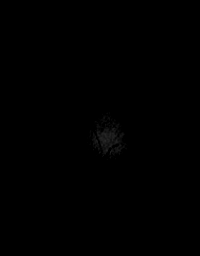

[Series 11: T2 fat-sat · axial · 3.0mm · 0.70mm/px · 1 of 21 slices shown (1 of 2)]
[im 1/21]
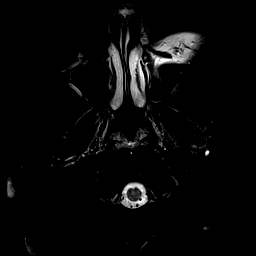

[Series 12: T2 fat-sat · coronal · 3.0mm · 0.35mm/px · 2 of 35 slices shown (2 of 2)]
[im 1/35]
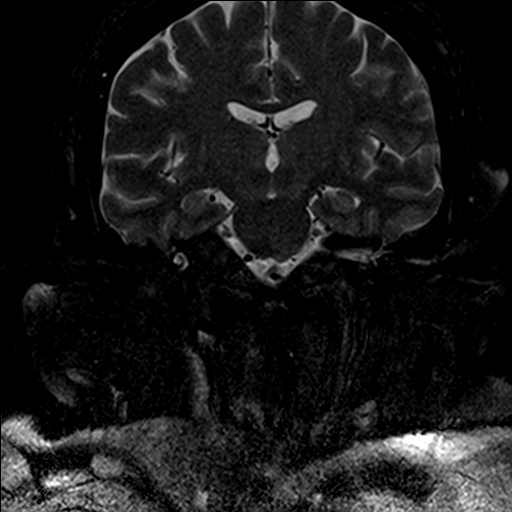
[im 35/35]
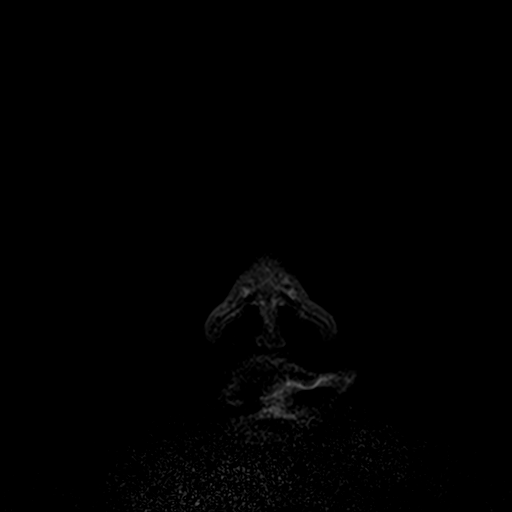

[Series 13: T1 · coronal · non-contrast · 3.0mm · 0.70mm/px · 2 of 35 slices shown (3 of 4)]
[im 1/35]
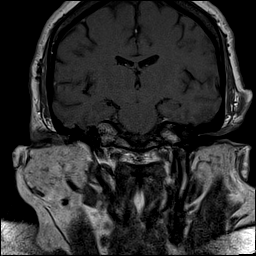
[im 35/35]
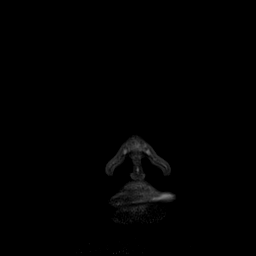

[Series 14: T1 · axial · non-contrast · 3.0mm · 0.70mm/px · 1 of 21 slices shown (4 of 4)]
[im 1/21]
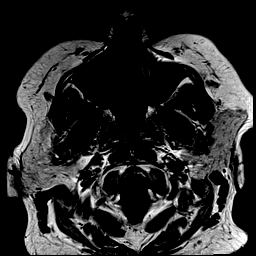

[Series 15: T1 fat-sat post-contrast · axial · 3.0mm · 0.70mm/px · 1 of 21 slices shown (1 of 2)]
[im 1/21]
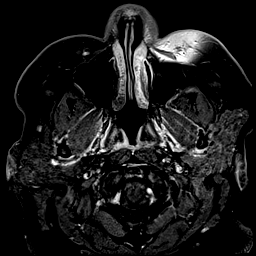

[Series 16: T1 fat-sat post-contrast · coronal · 3.0mm · 0.70mm/px · 2 of 33 slices shown (2 of 2)]
[im 1/33]
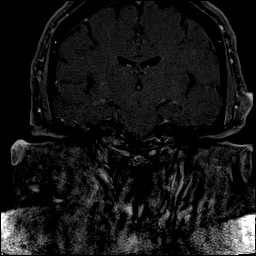
[im 33/33]
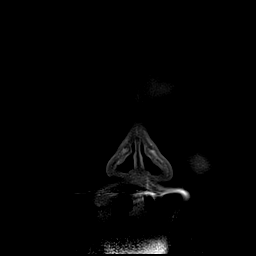

[Series 17: T2 post-contrast · coronal · 5.0mm · 0.43mm/px · 2 of 30 slices shown]
[im 1/30]
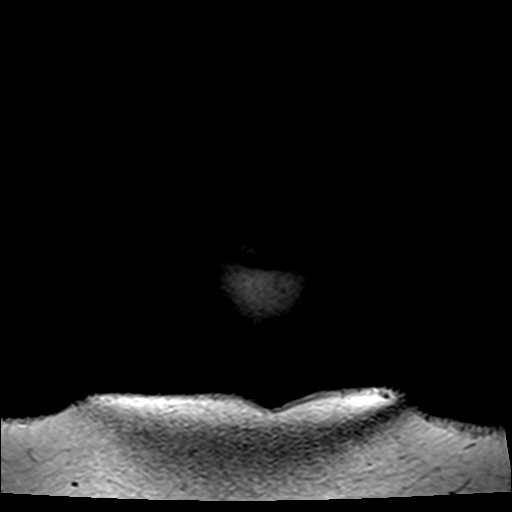
[im 30/30]
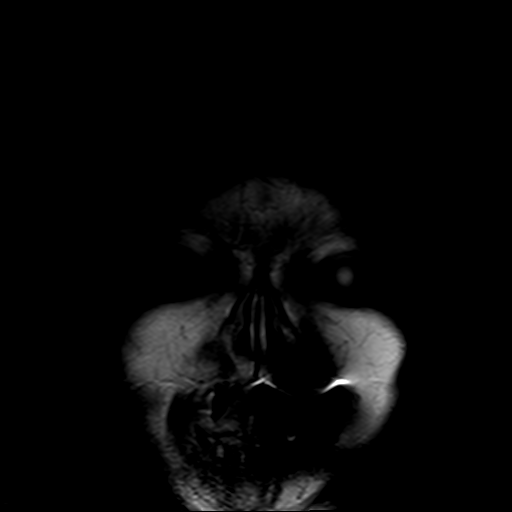

[Series 18: T1 post-contrast · axial · 1.0mm · 1.00mm/px · z∈[-73,+85]mm · 9 of 160 slices shown (1 of 2)]
[im 1/160]
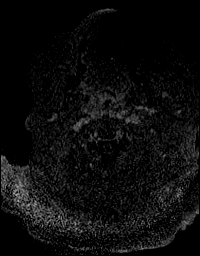
[im 20/160]
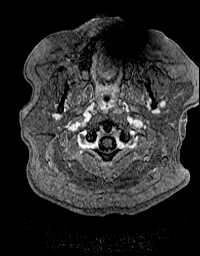
[im 40/160]
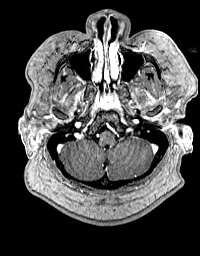
[im 60/160]
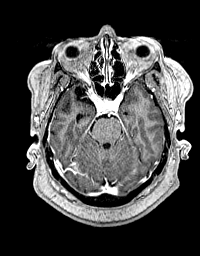
[im 80/160]
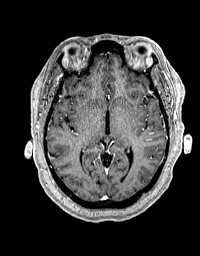
[im 100/160]
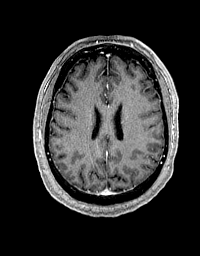
[im 120/160]
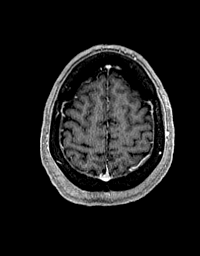
[im 140/160]
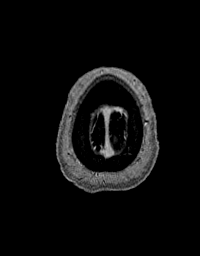
[im 160/160]
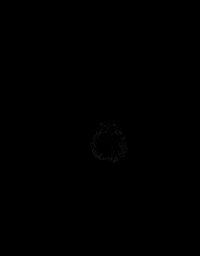

[Series 19: T1 post-contrast · coronal · 5.0mm · 0.43mm/px · 2 of 30 slices shown (2 of 2)]
[im 1/30]
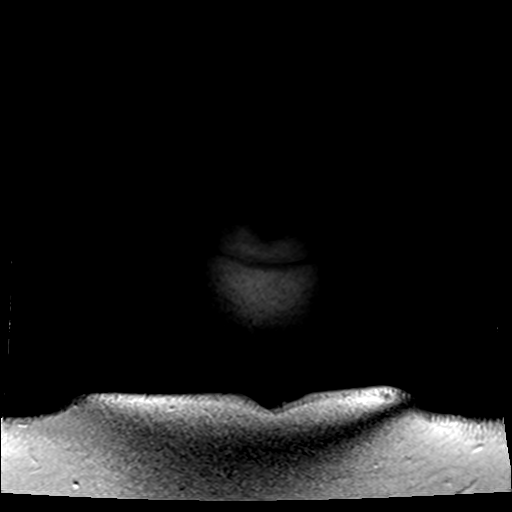
[im 30/30]
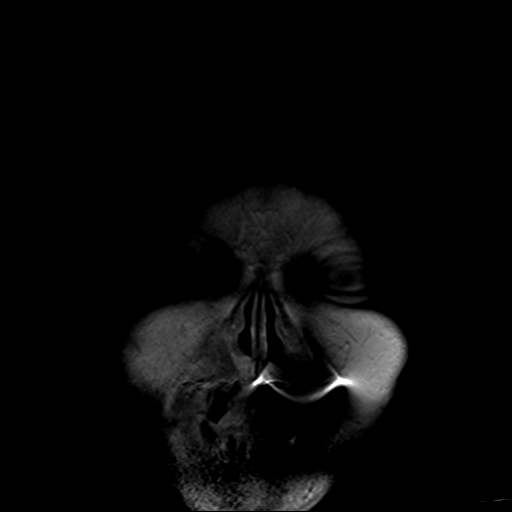

[Series 100: DWI · axial · 3.0mm · 1.20mm/px · z∈[-75,+86]mm · 3 of 55 slices shown (3 of 4)]
[im 1/55]
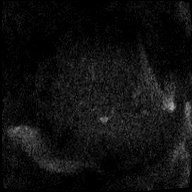
[im 28/55]
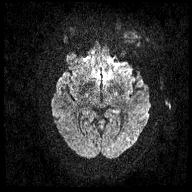
[im 55/55]
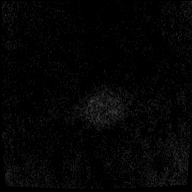

[Series 101: DWI · coronal · 3.0mm · 1.15mm/px · 2 of 42 slices shown (4 of 4)]
[im 1/42]
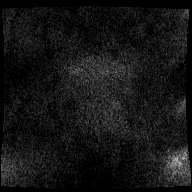
[im 42/42]
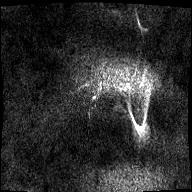

[48 of 48 positions shown; findings below may reference images not displayed]

FINDINGS: MRI HEAD FINDINGS

Brain: No infarct, suprasellar mass, or signal abnormality along the
optic pathways.

There is distortion of the inferior right more than left temporal
lobes with probable brain herniation into arachnoid pits on the
right more than left. Sella is not empty. No mass, hydrocephalus, or
infarct. Brain volume is normal. No evidence of demyelination or
infarct.

Vascular: Major flow voids are preserved

Skull and upper cervical spine: Hyperostosis interna. There is
cervical disc narrowing and endplate spurring.

MRI ORBITS FINDINGS

Orbits: Symmetric orbital fat when accounting for fat saturation
artifact on the left due to probable dental amalgam. The left optic
nerve appears smaller than the right on coronal images with greater
CSF within the sheath on axial slices. Extraocular muscle size and
shape is symmetric. Symmetric unremarkable lacrimal glands. Normal
superior ophthalmic vein appearance.

Visualized sinuses: Mild mucosal thickening in the ethmoids.

Soft tissues: No evidence of mass or inflammation.
IMPRESSION: 1. Subjective atrophic appearance of the left optic nerve. No
underlying cause is seen.
2. Prominent arachnoid granulations in right more than left middle
cranial fossa with suspected brain herniation. High-resolution skull
base CT could further evaluate bony anatomy if clinically needed. Is
there clinical indication of pseudotumor?

## 2019-11-16 ENCOUNTER — Other Ambulatory Visit: Payer: Self-pay | Admitting: Family Medicine

## 2019-11-16 DIAGNOSIS — Z1231 Encounter for screening mammogram for malignant neoplasm of breast: Secondary | ICD-10-CM

## 2019-12-22 ENCOUNTER — Ambulatory Visit
Admission: RE | Admit: 2019-12-22 | Discharge: 2019-12-22 | Disposition: A | Discharge status: Home / Self Care | Payer: Medicare HMO | Source: Ambulatory Visit | Attending: Family Medicine | Admitting: Family Medicine

## 2019-12-22 DIAGNOSIS — Z1231 Encounter for screening mammogram for malignant neoplasm of breast: Secondary | ICD-10-CM | POA: Insufficient documentation

## 2019-12-24 ENCOUNTER — Other Ambulatory Visit: Payer: Self-pay | Admitting: Family Medicine

## 2019-12-24 DIAGNOSIS — R928 Other abnormal and inconclusive findings on diagnostic imaging of breast: Secondary | ICD-10-CM

## 2019-12-24 DIAGNOSIS — N6310 Unspecified lump in the right breast, unspecified quadrant: Secondary | ICD-10-CM

## 2020-01-18 ENCOUNTER — Ambulatory Visit
Admission: RE | Admit: 2020-01-18 | Discharge: 2020-01-18 | Disposition: A | Discharge status: Home / Self Care | Payer: Medicare HMO | Source: Ambulatory Visit | Attending: Family Medicine | Admitting: Family Medicine

## 2020-01-18 DIAGNOSIS — R928 Other abnormal and inconclusive findings on diagnostic imaging of breast: Secondary | ICD-10-CM

## 2020-01-18 DIAGNOSIS — N6310 Unspecified lump in the right breast, unspecified quadrant: Secondary | ICD-10-CM

## 2020-03-02 ENCOUNTER — Ambulatory Visit: Payer: Medicare HMO | Admitting: Dermatology

## 2021-02-13 ENCOUNTER — Other Ambulatory Visit: Payer: Self-pay

## 2021-02-13 ENCOUNTER — Emergency Department: Payer: Medicare (Managed Care)

## 2021-02-13 ENCOUNTER — Emergency Department
Admission: EM | Admit: 2021-02-13 | Discharge: 2021-02-13 | Disposition: A | Discharge status: Home / Self Care | Payer: Medicare (Managed Care) | Attending: Emergency Medicine | Admitting: Emergency Medicine

## 2021-02-13 DIAGNOSIS — Z79899 Other long term (current) drug therapy: Secondary | ICD-10-CM | POA: Diagnosis not present

## 2021-02-13 DIAGNOSIS — J45901 Unspecified asthma with (acute) exacerbation: Secondary | ICD-10-CM

## 2021-02-13 DIAGNOSIS — I10 Essential (primary) hypertension: Secondary | ICD-10-CM | POA: Insufficient documentation

## 2021-02-13 DIAGNOSIS — R0602 Shortness of breath: Secondary | ICD-10-CM | POA: Diagnosis present

## 2021-02-13 DIAGNOSIS — J45998 Other asthma: Secondary | ICD-10-CM | POA: Insufficient documentation

## 2021-02-13 DIAGNOSIS — Z87891 Personal history of nicotine dependence: Secondary | ICD-10-CM | POA: Diagnosis not present

## 2021-02-13 LAB — CBC
HCT: 36.1 % (ref 36.0–46.0)
Hemoglobin: 12.1 g/dL (ref 12.0–15.0)
MCH: 30.1 pg (ref 26.0–34.0)
MCHC: 33.5 g/dL (ref 30.0–36.0)
MCV: 89.8 fL (ref 80.0–100.0)
Platelets: 146 10*3/uL — ABNORMAL LOW (ref 150–400)
RBC: 4.02 MIL/uL (ref 3.87–5.11)
RDW: 14 % (ref 11.5–15.5)
WBC: 4.1 10*3/uL (ref 4.0–10.5)
nRBC: 0 % (ref 0.0–0.2)

## 2021-02-13 LAB — BASIC METABOLIC PANEL
Anion gap: 7 (ref 5–15)
BUN: 20 mg/dL (ref 8–23)
CO2: 27 mmol/L (ref 22–32)
Calcium: 9.2 mg/dL (ref 8.9–10.3)
Chloride: 109 mmol/L (ref 98–111)
Creatinine, Ser: 1.04 mg/dL — ABNORMAL HIGH (ref 0.44–1.00)
GFR, Estimated: 60 mL/min — ABNORMAL LOW (ref >60)
Glucose, Bld: 108 mg/dL — ABNORMAL HIGH (ref 70–99)
Potassium: 3.5 mmol/L (ref 3.5–5.1)
Sodium: 143 mmol/L (ref 135–145)

## 2021-02-13 LAB — TROPONIN I (HIGH SENSITIVITY)
Troponin I (High Sensitivity): 5 ng/L (ref <18)
Troponin I (High Sensitivity): 4 ng/L (ref <18)

## 2021-02-13 MED ORDER — PREDNISONE 20 MG PO TABS: 60.0000 mg | ORAL_TABLET | Freq: Every day | ORAL | 0 refills | Status: AC

## 2021-02-13 MED ORDER — PREDNISONE 20 MG PO TABS
60.0000 mg | ORAL_TABLET | Freq: Once | ORAL | Status: AC
  Administered 2021-02-13: 60 mg via ORAL
  Filled 2021-02-13: qty 3

## 2021-02-13 MED ORDER — IPRATROPIUM-ALBUTEROL 0.5-2.5 (3) MG/3ML IN SOLN
9.0000 mL | Freq: Once | RESPIRATORY_TRACT | Status: AC
  Administered 2021-02-13: 9 mL via RESPIRATORY_TRACT
  Filled 2021-02-13: qty 3

## 2021-02-13 NOTE — ED Triage Notes (Signed)
Pt comes with c/o SOB that started this am. Pt states some tightness in chest. Pt states productive cough for about a week.   Pt denies any N/V/D.  Pt states more SOB with exertion.

## 2021-02-13 NOTE — ED Notes (Signed)
Pt c/o having a cough since last week with congestion, states she last night she has had SOB with tightness in her chest. Pt is in NAD, skin is warm and dry. Pt is a/ox4.

## 2021-02-13 NOTE — ED Provider Notes (Signed)
[facility] Emergency Department Provider Note   ____________________________________________   Event Date/Time   First MD Initiated Contact with Patient 02/13/21 0802     (approximate)  I have reviewed the triage vital signs and the nursing notes.   HISTORY  Chief Complaint Shortness of Breath and Cough    HPI Alyssa Leonard is a 65 y.o. female with past medical history of hypertension, asthma, and GERD who presents to the ED complaining of shortness of breath.  Patient reports that she has been dealing with a dry cough worsening over the past week.  When she woke up this morning she was having a hard time catching her breath and complains of a tight feeling in her chest.  She denies any overt chest pain and has not had any fevers.  She denies any pain or swelling in her legs.  Symptoms are similar to prior asthma exacerbations, but she states she has not felt better using her inhaler.  She has not had any recent sick contacts and denies any nausea, vomiting, or diarrhea.        Past Medical History:  Diagnosis Date  . Asthma   . GERD (gastroesophageal reflux disease)   . History of colon polyps   . Hypertension   . Meniere disease   . Sleep apnea    Went for sleep study test in January 2018 "I haven't heard back from test"    There are no problems to display for this patient.   Past Surgical History:  Procedure Laterality Date  . ABDOMINAL HYSTERECTOMY    . CHOLECYSTECTOMY    . COLONOSCOPY WITH PROPOFOL N/A 12/11/2016   Procedure: COLONOSCOPY WITH PROPOFOL;  Surgeon: Manya Silvas, MD;  Location: [facility] ENDOSCOPY;  Service: Endoscopy;  Laterality: N/A;    Prior to Admission medications   Medication Sig Start Date End Date Taking? Authorizing Provider  predniSONE (DELTASONE) 20 MG tablet Take 3 tablets (60 mg total) by mouth daily with breakfast for 5 days. 02/13/21 02/18/21 Yes Blake Divine, MD  albuterol (PROVENTIL HFA;VENTOLIN HFA) 108  (90 Base) MCG/ACT inhaler Inhale 2 puffs into the lungs every 6 (six) hours as needed for wheezing or shortness of breath.    [provider]  amLODipine (NORVASC) 5 MG tablet Take 5 mg by mouth daily.    [provider]  Brinzolamide-Brimonidine [facility]) 1-0.2 % SUSP Apply to eye 2 (two) times daily.    [provider]  celecoxib (CELEBREX) 200 MG capsule Take 200 mg by mouth 2 (two) times daily.    [provider]  Cholecalciferol (VITAMIN D) 2000 units CAPS Take by mouth.    [provider]  docusate sodium (COLACE) 50 MG capsule Take 50 mg by mouth daily.    [provider]  doxycycline (VIBRA-TABS) 100 MG tablet Take 100 mg by mouth 2 (two) times daily.    [provider]  Fluticasone Furoate 50 MCG/ACT AEPB Inhale into the lungs.    [provider]  Insulin Pen Needle 31G X 5 MM MISC by Does not apply route.    [provider]  latanoprost (XALATAN) 0.005 % ophthalmic solution 1 drop at bedtime.    [provider]  liraglutide (VICTOZA) 18 MG/3ML SOPN Inject 18 mg into the skin daily.    [provider]  losartan-hydrochlorothiazide (HYZAAR) 50-12.5 MG tablet Take 1 tablet by mouth daily.    [provider]  meclizine (ANTIVERT) 25 MG tablet Take 25 mg by  mouth 3 (three) times daily as needed for dizziness.    [provider]  meloxicam (MOBIC) 7.5 MG tablet Take 7.5 mg by mouth daily.    [provider]  metoprolol succinate (TOPROL-XL) 25 MG 24 hr tablet Take 25 mg by mouth daily.    [provider]  ondansetron (ZOFRAN) 4 MG tablet Take 4 mg by mouth every 8 (eight) hours as needed for nausea or vomiting.    [provider]  pantoprazole (PROTONIX) 40 MG tablet Take 40 mg by mouth daily.    [provider]  Polyethylene Glycol POWD by Does not apply route.    [provider]  triamcinolone acetonide 40 MG/ML SUSP 40 mg,  mupirocin cream 2 % CREA 15 g Apply 1 application topically 3 (three) times daily.    [provider]    Allergies Aspirin  Family History  Problem Relation Age of Onset  . Hypertension Mother   . Kidney disease Sister   . Kidney disease Brother   . Heart disease Brother   . Heart disease Brother   . Breast cancer Neg Hx     Social History Social History   Tobacco Use  . Smoking status: Former Research scientist (life sciences)  . Smokeless tobacco: Never Used  Substance Use Topics  . Alcohol use: No  . Drug use: No    Review of Systems  Constitutional: No fever/chills Eyes: No visual changes. ENT: No sore throat. Cardiovascular: Positive for chest tightness. Respiratory: Positive for cough and shortness of breath. Gastrointestinal: No abdominal pain.  No nausea, no vomiting.  No diarrhea.  No constipation. Genitourinary: Negative for dysuria. Musculoskeletal: Negative for back pain. Skin: Negative for rash. Neurological: Negative for headaches, focal weakness or numbness.  ____________________________________________   PHYSICAL EXAM:  VITAL SIGNS: ED Triage Vitals  Enc Vitals Group     BP 02/13/21 0805 (!) 148/85     Pulse Rate 02/13/21 0805 91     Resp 02/13/21 0805 (!) 21     Temp 02/13/21 0805 98 F (36.7 C)     Temp src --      SpO2 02/13/21 0805 100 %     Weight 02/13/21 0804 300 lb (136.1 kg)     Height 02/13/21 0804 5' (1.524 m)     Head Circumference --      Peak Flow --      Pain Score 02/13/21 0803 5     Pain Loc --      Pain Edu? --      Excl. in [place]? --     Constitutional: Alert and oriented. Eyes: Conjunctivae are normal. Head: Atraumatic. Nose: No congestion/rhinnorhea. Mouth/Throat: Mucous membranes are moist. Neck: Normal ROM Cardiovascular: Normal rate, regular rhythm. Grossly normal heart sounds. Respiratory: Mildly tachypneic with increased respiratory effort.  No retractions. Lungs with inspiratory expiratory wheezing  bilaterally. Gastrointestinal: Soft and nontender. No distention. Genitourinary: deferred Musculoskeletal: No lower extremity tenderness nor edema. Neurologic:  Normal speech and language. No gross focal neurologic deficits are appreciated. Skin:  Skin is warm, dry and intact. No rash noted. Psychiatric: Mood and affect are normal. Speech and behavior are normal.  ____________________________________________   LABS (all labs ordered are listed, but only abnormal results are displayed)  Labs Reviewed  BASIC METABOLIC PANEL - Abnormal; Notable for the following components:      Result Value   Glucose, Bld 108 (*)    Creatinine, Ser 1.04 (*)    GFR, Estimated 60 (*)  All other components within normal limits  CBC - Abnormal; Notable for the following components:   Platelets 146 (*)    All other components within normal limits  TROPONIN I (HIGH SENSITIVITY)  TROPONIN I (HIGH SENSITIVITY)   ____________________________________________  EKG  ED ECG REPORT I, Blake Divine, the attending physician, personally viewed and interpreted this ECG.   Date: 02/13/2021  EKG Time: 8:07  Rate: 89  Rhythm: normal sinus rhythm  Axis: Normal  Intervals:none  ST&T Change: None   PROCEDURES  Procedure(s) performed (including Critical Care):  Procedures   ____________________________________________   INITIAL IMPRESSION / ASSESSMENT AND PLAN / ED COURSE       65 year old female with past medical history of hypertension, asthma, and GERD who presents to the ED complaining of increasing dry cough with difficulty breathing starting this morning.  She is mildly tachypneic but maintaining O2 sats on room air, wheezing noted on exam.  We will treat with duo nebs and steroids for suspected asthma exacerbation.  EKG shows no evidence of arrhythmia or ischemia and I have low suspicion for cardiac etiology or PE.  We will further assess with labs including troponin, also check chest x-ray  for pneumonia.  Chest x-ray reviewed by me and shows no infiltrate, edema, or effusion.  Lab work is reassuring, troponin within normal limits.  Patient reports feeling much better following breathing treatments and steroids, work of breathing is improved on reevaluation and she continues to maintain O2 sats on room air.  She is appropriate for discharge home on course of steroids, states she has inhaler available at home.  She was counseled to follow-up with her PCP and to return to the ED for new worsening symptoms, patient agrees with plan.      ____________________________________________   FINAL CLINICAL IMPRESSION(S) / ED DIAGNOSES  Final diagnoses:  Exacerbation of persistent asthma, unspecified asthma severity     ED Discharge Orders         Ordered    predniSONE (DELTASONE) 20 MG tablet  Daily with breakfast        02/13/21 1117           Note:  This document was prepared using Dragon voice recognition software and may include unintentional dictation errors.   Blake Divine, MD 02/13/21 1119

## 2021-05-21 DIAGNOSIS — Z0100 Encounter for examination of eyes and vision without abnormal findings: Secondary | ICD-10-CM | POA: Diagnosis not present

## 2021-05-21 DIAGNOSIS — H401111 Primary open-angle glaucoma, right eye, mild stage: Secondary | ICD-10-CM | POA: Diagnosis not present

## 2021-06-19 DIAGNOSIS — R300 Dysuria: Secondary | ICD-10-CM | POA: Diagnosis not present

## 2021-06-19 DIAGNOSIS — Z1389 Encounter for screening for other disorder: Secondary | ICD-10-CM | POA: Diagnosis not present

## 2021-06-19 DIAGNOSIS — B373 Candidiasis of vulva and vagina: Secondary | ICD-10-CM | POA: Diagnosis not present

## 2021-06-19 DIAGNOSIS — Z0000 Encounter for general adult medical examination without abnormal findings: Secondary | ICD-10-CM | POA: Diagnosis not present

## 2021-06-28 ENCOUNTER — Other Ambulatory Visit: Payer: Self-pay | Admitting: Internal Medicine

## 2021-06-28 DIAGNOSIS — Z1231 Encounter for screening mammogram for malignant neoplasm of breast: Secondary | ICD-10-CM

## 2021-07-16 ENCOUNTER — Other Ambulatory Visit: Payer: Self-pay

## 2021-07-16 ENCOUNTER — Ambulatory Visit
Admission: RE | Admit: 2021-07-16 | Discharge: 2021-07-16 | Disposition: A | Discharge status: Home / Self Care | Payer: Medicare HMO | Source: Ambulatory Visit | Attending: Internal Medicine | Admitting: Internal Medicine

## 2021-07-16 DIAGNOSIS — Z1231 Encounter for screening mammogram for malignant neoplasm of breast: Secondary | ICD-10-CM | POA: Insufficient documentation

## 2021-11-27 ENCOUNTER — Ambulatory Visit: Payer: Medicare HMO | Admitting: Anesthesiology

## 2021-11-27 ENCOUNTER — Encounter
Admission: RE | Disposition: A | Discharge status: Home / Self Care | Payer: Self-pay | Source: Ambulatory Visit | Attending: Gastroenterology

## 2021-11-27 ENCOUNTER — Ambulatory Visit
Admission: RE | Admit: 2021-11-27 | Discharge: 2021-11-27 | Disposition: A | Discharge status: Home / Self Care | Payer: Medicare HMO | Source: Ambulatory Visit | Attending: Gastroenterology | Admitting: Gastroenterology

## 2021-11-27 DIAGNOSIS — K5730 Diverticulosis of large intestine without perforation or abscess without bleeding: Secondary | ICD-10-CM | POA: Insufficient documentation

## 2021-11-27 DIAGNOSIS — Z6841 Body Mass Index (BMI) 40.0 and over, adult: Secondary | ICD-10-CM | POA: Insufficient documentation

## 2021-11-27 DIAGNOSIS — Z1211 Encounter for screening for malignant neoplasm of colon: Secondary | ICD-10-CM | POA: Insufficient documentation

## 2021-11-27 DIAGNOSIS — I10 Essential (primary) hypertension: Secondary | ICD-10-CM | POA: Diagnosis not present

## 2021-11-27 DIAGNOSIS — K640 First degree hemorrhoids: Secondary | ICD-10-CM | POA: Insufficient documentation

## 2021-11-27 DIAGNOSIS — Z87891 Personal history of nicotine dependence: Secondary | ICD-10-CM | POA: Insufficient documentation

## 2021-11-27 DIAGNOSIS — D123 Benign neoplasm of transverse colon: Secondary | ICD-10-CM | POA: Insufficient documentation

## 2021-11-27 DIAGNOSIS — J449 Chronic obstructive pulmonary disease, unspecified: Secondary | ICD-10-CM | POA: Diagnosis not present

## 2021-11-27 DIAGNOSIS — E119 Type 2 diabetes mellitus without complications: Secondary | ICD-10-CM | POA: Diagnosis not present

## 2021-11-27 DIAGNOSIS — K219 Gastro-esophageal reflux disease without esophagitis: Secondary | ICD-10-CM | POA: Insufficient documentation

## 2021-11-27 DIAGNOSIS — Z86010 Personal history of colonic polyps: Secondary | ICD-10-CM | POA: Insufficient documentation

## 2021-11-27 HISTORY — DX: Chronic obstructive pulmonary disease, unspecified: J44.9

## 2021-11-27 HISTORY — DX: Type 2 diabetes mellitus without complications: E11.9

## 2021-11-27 HISTORY — PX: COLONOSCOPY WITH PROPOFOL: SHX5780

## 2021-11-27 LAB — GLUCOSE, CAPILLARY: Glucose-Capillary: 84 mg/dL (ref 70–99)

## 2021-11-27 SURGERY — COLONOSCOPY WITH PROPOFOL
Anesthesia: General

## 2021-11-27 MED ORDER — STERILE WATER FOR IRRIGATION IR SOLN
Status: DC | PRN
  Administered 2021-11-27: 60 mL

## 2021-11-27 MED ORDER — PROPOFOL 500 MG/50ML IV EMUL
INTRAVENOUS | Status: DC | PRN
  Administered 2021-11-27: 150 ug/kg/min via INTRAVENOUS

## 2021-11-27 MED ORDER — SODIUM CHLORIDE 0.9 % IV SOLN
INTRAVENOUS | Status: DC
  Administered 2021-11-27: 1000 mL via INTRAVENOUS

## 2021-11-27 MED ORDER — PROPOFOL 10 MG/ML IV BOLUS
INTRAVENOUS | Status: DC | PRN
  Administered 2021-11-27: 20 mg via INTRAVENOUS
  Administered 2021-11-27: 50 mg via INTRAVENOUS

## 2021-11-27 NOTE — Anesthesia Preprocedure Evaluation (Signed)
Anesthesia Evaluation  Patient identified by MRN, date of birth, ID band Patient awake    Reviewed: Allergy & Precautions, NPO status , Patient's Chart, lab work & pertinent test results  History of Anesthesia Complications Negative for: history of anesthetic complications  Airway Mallampati: III  TM Distance: >3 FB Neck ROM: full    Dental  (+) Chipped, Poor Dentition, Missing   Pulmonary shortness of breath and with exertion, asthma , sleep apnea and Continuous Positive Airway Pressure Ventilation , COPD, former smoker,    Pulmonary exam normal        Cardiovascular hypertension, (-) angina(-) Past MI Normal cardiovascular exam     Neuro/Psych negative neurological ROS  negative psych ROS   GI/Hepatic Neg liver ROS, GERD  Medicated and Controlled,  Endo/Other  diabetes, Type 2  Renal/GU negative Renal ROS  negative genitourinary   Musculoskeletal   Abdominal   Peds  Hematology negative hematology ROS (+)   Anesthesia Other Findings Past Medical History: No date: Asthma No date: COPD (chronic obstructive pulmonary disease) (HCC) No date: Diabetes mellitus without complication (HCC) No date: GERD (gastroesophageal reflux disease) No date: History of colon polyps No date: Hypertension No date: Meniere disease No date: Sleep apnea     Comment:  Went for sleep study test in January 2018 "I haven't               heard back from test"  Past Surgical History: No date: ABDOMINAL HYSTERECTOMY No date: BARIATRIC SURGERY No date: CHOLECYSTECTOMY 12/11/2016: COLONOSCOPY WITH PROPOFOL; N/A     Comment:  Procedure: COLONOSCOPY WITH PROPOFOL;  Surgeon: Manya Silvas, MD;  Location: [facility] ENDOSCOPY;  Service:               Endoscopy;  Laterality: N/A;  BMI    Body Mass Index: 58.59 kg/m      Reproductive/Obstetrics negative OB ROS                             Anesthesia  Physical Anesthesia Plan  ASA: 3  Anesthesia Plan: General   Post-op Pain Management:    Induction: Intravenous  PONV Risk Score and Plan: Propofol infusion and TIVA  Airway Management Planned: Natural Airway and Nasal Cannula  Additional Equipment:   Intra-op Plan:   Post-operative Plan:   Informed Consent: I have reviewed the patients History and Physical, chart, labs and discussed the procedure including the risks, benefits and alternatives for the proposed anesthesia with the patient or authorized representative who has indicated his/her understanding and acceptance.     Dental Advisory Given  Plan Discussed with: Anesthesiologist, CRNA and Surgeon  Anesthesia Plan Comments: (Patient consented for risks of anesthesia including but not limited to:  - adverse reactions to medications - risk of airway placement if required - damage to eyes, teeth, lips or other oral mucosa - nerve damage due to positioning  - sore throat or hoarseness - Damage to heart, brain, nerves, lungs, other parts of body or loss of life  Patient voiced understanding.)        Anesthesia Quick Evaluation

## 2021-11-27 NOTE — H&P (Signed)
Outpatient short stay form Pre-procedure 11/27/2021  Lesly Rubenstein, MD  Primary Physician: Baxter Hire, MD  Reason for visit:  Surveillance  History of present illness:    66 y/o lady with morbid obesity here for surveillance colonoscopy. Last colonoscopy in 2018 with > 1 cm TA completely removed. No blood thinners. History of hysterectomy. No known family history of GI malignancies.    Current Facility-Administered Medications:    0.9 %  sodium chloride infusion, , Intravenous, Continuous, Laquiesha Piacente, Hilton Cork, MD  Medications Prior to Admission  Medication Sig Dispense Refill Last Dose   albuterol (PROVENTIL HFA;VENTOLIN HFA) 108 (90 Base) MCG/ACT inhaler Inhale 2 puffs into the lungs every 6 (six) hours as needed for wheezing or shortness of breath.   Past Month   Brinzolamide-Brimonidine 1-0.2 % SUSP Apply to eye 2 (two) times daily.   11/27/2021   celecoxib (CELEBREX) 200 MG capsule Take 200 mg by mouth 2 (two) times daily.   Past Week   Cholecalciferol (VITAMIN D) 2000 units CAPS Take by mouth.   Past Week   diazepam (VALIUM) 2 MG tablet Take 2 mg by mouth daily.   Past Week   docusate sodium (COLACE) 50 MG capsule Take 50 mg by mouth daily.   11/26/2021   doxycycline (VIBRA-TABS) 100 MG tablet Take 100 mg by mouth 2 (two) times daily.   Past Week   Fluticasone Furoate 50 MCG/ACT AEPB Inhale into the lungs.   Past Week   latanoprost (XALATAN) 0.005 % ophthalmic solution 1 drop at bedtime.   11/26/2021   liraglutide (VICTOZA) 18 MG/3ML SOPN Inject 18 mg into the skin daily.   Past Week   losartan-hydrochlorothiazide (HYZAAR) 50-12.5 MG tablet Take 1 tablet by mouth daily.   11/26/2021   meclizine (ANTIVERT) 25 MG tablet Take 25 mg by mouth 3 (three) times daily as needed for dizziness.   Past Month   meloxicam (MOBIC) 7.5 MG tablet Take 7.5 mg by mouth daily.   Past Month   ondansetron (ZOFRAN) 4 MG tablet Take 4 mg by mouth every 8 (eight) hours as needed for nausea or  vomiting.   Past Week   Polyethylene Glycol POWD by Does not apply route.   11/27/2021   triamcinolone acetonide 40 MG/ML SUSP 40 mg, mupirocin cream 2 % CREA 15 g Apply 1 application topically 3 (three) times daily.   11/26/2021   amLODipine (NORVASC) 5 MG tablet Take 5 mg by mouth daily. (Patient not taking: Reported on 11/27/2021)   Not Taking   Insulin Pen Needle 31G X 5 MM MISC by Does not apply route. (Patient not taking: Reported on 11/27/2021)   Not Taking   metoprolol succinate (TOPROL-XL) 25 MG 24 hr tablet Take 25 mg by mouth daily. (Patient not taking: Reported on 11/27/2021)   Not Taking   pantoprazole (PROTONIX) 40 MG tablet Take 40 mg by mouth daily. (Patient not taking: Reported on 11/27/2021)   Not Taking     Allergies  Allergen Reactions   Aspirin Other (See Comments)   Latex Other (See Comments)    unknown     Past Medical History:  Diagnosis Date   Asthma    COPD (chronic obstructive pulmonary disease) ([place])    Diabetes mellitus without complication (HCC)    GERD (gastroesophageal reflux disease)    History of colon polyps    Hypertension    Meniere disease    Sleep apnea    Went for sleep study test in January 2018 "  I haven't heard back from test"    Review of systems:  Otherwise negative.    Physical Exam  Gen: Alert, oriented. Appears stated age.  HEENT: PERRLA. Lungs: No respiratory distress CV: RRR Abd: soft, benign, no masses Ext: No edema    Planned procedures: Proceed with colonoscopy. The patient understands the nature of the planned procedure, indications, risks, alternatives and potential complications including but not limited to bleeding, infection, perforation, damage to internal organs and possible oversedation/side effects from anesthesia. The patient agrees and gives consent to proceed.  Please refer to procedure notes for findings, recommendations and patient disposition/instructions.     Lesly Rubenstein, MD [facility]  Gastroenterology

## 2021-11-27 NOTE — Interval H&P Note (Signed)
History and Physical Interval Note:  11/27/2021 9:53 AM  Alyssa Leonard  has presented today for surgery, with the diagnosis of history colon polyps.  The various methods of treatment have been discussed with the patient and family. After consideration of risks, benefits and other options for treatment, the patient has consented to  Procedure(s): COLONOSCOPY WITH PROPOFOL (N/A) as a surgical intervention.  The patient's history has been reviewed, patient examined, no change in status, stable for surgery.  I have reviewed the patient's chart and labs.  Questions were answered to the patient's satisfaction.     Lesly Rubenstein  Ok to proceed with colonoscopy

## 2021-11-27 NOTE — Transfer of Care (Signed)
Immediate Anesthesia Transfer of Care Note  Patient: Alyssa Leonard  Procedure(s) Performed: COLONOSCOPY WITH PROPOFOL  Patient Location: PACU  Anesthesia Type:General  Level of Consciousness: awake, alert  and oriented  Airway & Oxygen Therapy: Patient Spontanous Breathing and Patient connected to face mask oxygen  Post-op Assessment: Report given to RN and Post -op Vital signs reviewed and stable  Post vital signs: Reviewed and stable  Last Vitals:  Vitals Value Taken Time  BP 136/75 11/27/21 1031  Temp    Pulse    Resp 11 11/27/21 1031  SpO2    Vitals shown include unvalidated device data.  Last Pain:  Vitals:   11/27/21 1030  TempSrc:   PainSc: 0-No pain         Complications: No notable events documented.

## 2021-11-27 NOTE — Anesthesia Postprocedure Evaluation (Signed)
Anesthesia Post Note  Patient: Alyssa Leonard  Procedure(s) Performed: COLONOSCOPY WITH PROPOFOL  Patient location during evaluation: Endoscopy Anesthesia Type: General Level of consciousness: awake and alert Pain management: pain level controlled Vital Signs Assessment: post-procedure vital signs reviewed and stable Respiratory status: spontaneous breathing, nonlabored ventilation, respiratory function stable and patient connected to nasal cannula oxygen Cardiovascular status: blood pressure returned to baseline and stable Postop Assessment: no apparent nausea or vomiting Anesthetic complications: no   No notable events documented.   Last Vitals:  Vitals:   11/27/21 1056 11/27/21 1110  BP: 139/73   Pulse:    Resp:  (!) 170  Temp: (!) 35.6 C   SpO2:      Last Pain:  Vitals:   11/27/21 1110  TempSrc:   PainSc: 0-No pain                 Precious Haws Neely Kammerer

## 2021-11-27 NOTE — Anesthesia Procedure Notes (Signed)
Date/Time: 11/27/2021 10:15 AM Performed by: Nelda Marseille, CRNA Pre-anesthesia Checklist: Patient identified, Emergency Drugs available, Suction available, Patient being monitored and Timeout performed Oxygen Delivery Method: Simple face mask

## 2021-11-27 NOTE — Addendum Note (Signed)
Addendum  created 11/27/21 1547 by Nelda Marseille, CRNA   Intraprocedure Meds edited

## 2021-11-27 NOTE — Op Note (Signed)
[facility] Gastroenterology Patient Name: Alyssa Leonard Procedure Date: 11/27/2021 9:40 AM MRN: [id number] Account #: [id number] Date of Birth: 1955/11/21 Admit Type: Outpatient Age: 66 Room: [facility] ENDO ROOM 1 Gender: Female Note Status: Finalized Instrument Name: Park Meo [id number] Procedure:             Colonoscopy Indications:           Surveillance: Personal history of adenomatous polyps                         on last colonoscopy 3 years ago Providers:             Andrey Farmer MD, MD Medicines:             Monitored Anesthesia Care Complications:         No immediate complications. Estimated blood loss:                         Minimal. Procedure:             Pre-Anesthesia Assessment:                        - Prior to the procedure, a History and Physical was                         performed, and patient medications and allergies were                         reviewed. The patient is competent. The risks and                         benefits of the procedure and the sedation options and                         risks were discussed with the patient. All questions                         were answered and informed consent was obtained.                         Patient identification and proposed procedure were                         verified by the physician, the nurse, the                         anesthesiologist, the anesthetist and the technician                         in the endoscopy suite. Mental Status Examination:                         alert and oriented. Airway Examination: normal                         oropharyngeal airway and neck mobility. Respiratory                         Examination: clear to auscultation. CV Examination:  normal. Prophylactic Antibiotics: The patient does not                         require prophylactic antibiotics. Prior                         Anticoagulants: The patient has taken no previous                          anticoagulant or antiplatelet agents. ASA Grade                         Assessment: III - A patient with severe systemic                         disease. After reviewing the risks and benefits, the                         patient was deemed in satisfactory condition to                         undergo the procedure. The anesthesia plan was to use                         monitored anesthesia care (MAC). Immediately prior to                         administration of medications, the patient was                         re-assessed for adequacy to receive sedatives. The                         heart rate, respiratory rate, oxygen saturations,                         blood pressure, adequacy of pulmonary ventilation, and                         response to care were monitored throughout the                         procedure. The physical status of the patient was                         re-assessed after the procedure.                        After obtaining informed consent, the colonoscope was                         passed under direct vision. Throughout the procedure,                         the patient's blood pressure, pulse, and oxygen                         saturations were monitored continuously. The  Colonoscope was introduced through the anus and                         advanced to the the cecum, identified by appendiceal                         orifice and ileocecal valve. The colonoscopy was                         performed without difficulty. The patient tolerated                         the procedure well. The quality of the bowel                         preparation was good. Findings:      The perianal and digital rectal examinations were normal.      Two sessile polyps were found in the transverse colon. The polyps were 2       to 3 mm in size. These polyps were removed with a cold snare. Resection       and retrieval were complete.  Estimated blood loss was minimal.      Scattered small-mouthed diverticula were found in the sigmoid colon,       descending colon and ascending colon.      Internal hemorrhoids were found during retroflexion. The hemorrhoids       were Grade I (internal hemorrhoids that do not prolapse).      The exam was otherwise without abnormality on direct and retroflexion       views. Impression:            - Two 2 to 3 mm polyps in the transverse colon,                         removed with a cold snare. Resected and retrieved.                        - Diverticulosis in the sigmoid colon, in the                         descending colon and in the ascending colon.                        - Internal hemorrhoids.                        - The examination was otherwise normal on direct and                         retroflexion views. Recommendation:        - Discharge patient to home.                        - Resume previous diet.                        - Continue present medications.                        - Await pathology results.                        -  Repeat colonoscopy in 5 years for surveillance.                        - Return to referring physician as previously                         scheduled. Procedure Code(s):     --- Professional ---                        [phone], Colonoscopy, flexible; with removal of                         tumor(s), polyp(s), or other lesion(s) by snare                         technique Diagnosis Code(s):     --- Professional ---                        Z86.010, Personal history of colonic polyps                        K63.5, Polyp of colon                        K64.0, First degree hemorrhoids                        K57.30, Diverticulosis of large intestine without                         perforation or abscess without bleeding CPT copyright 2019 [employer]. All rights reserved. The codes documented in this report are preliminary and upon coder  review may  be revised to meet current compliance requirements. Andrey Farmer MD, MD 11/27/2021 10:29:18 AM Number of Addenda: 0 Note Initiated On: 11/27/2021 9:40 AM Scope Withdrawal Time: 0 hours 9 minutes 37 seconds  Total Procedure Duration: 0 hours 17 minutes 3 seconds  Estimated Blood Loss:  Estimated blood loss was minimal.      [facility]

## 2021-11-28 ENCOUNTER — Encounter: Payer: Self-pay | Admitting: Gastroenterology

## 2021-11-28 LAB — SURGICAL PATHOLOGY

## 2022-03-16 DIAGNOSIS — E119 Type 2 diabetes mellitus without complications: Secondary | ICD-10-CM | POA: Insufficient documentation

## 2022-03-16 DIAGNOSIS — Z7951 Long term (current) use of inhaled steroids: Secondary | ICD-10-CM | POA: Insufficient documentation

## 2022-03-16 DIAGNOSIS — J45909 Unspecified asthma, uncomplicated: Secondary | ICD-10-CM | POA: Diagnosis not present

## 2022-03-16 DIAGNOSIS — Z794 Long term (current) use of insulin: Secondary | ICD-10-CM | POA: Insufficient documentation

## 2022-03-16 DIAGNOSIS — M10071 Idiopathic gout, right ankle and foot: Secondary | ICD-10-CM | POA: Diagnosis not present

## 2022-03-16 DIAGNOSIS — J449 Chronic obstructive pulmonary disease, unspecified: Secondary | ICD-10-CM | POA: Diagnosis not present

## 2022-03-16 DIAGNOSIS — Z91040 Latex allergy status: Secondary | ICD-10-CM | POA: Insufficient documentation

## 2022-03-16 DIAGNOSIS — I10 Essential (primary) hypertension: Secondary | ICD-10-CM | POA: Diagnosis not present

## 2022-03-16 DIAGNOSIS — Z79899 Other long term (current) drug therapy: Secondary | ICD-10-CM | POA: Insufficient documentation

## 2022-03-16 DIAGNOSIS — M79671 Pain in right foot: Secondary | ICD-10-CM | POA: Diagnosis present

## 2022-03-17 ENCOUNTER — Emergency Department
Admission: EM | Admit: 2022-03-17 | Discharge: 2022-03-17 | Disposition: A | Discharge status: Home / Self Care | Payer: Medicare HMO | Attending: Emergency Medicine | Admitting: Emergency Medicine

## 2022-03-17 ENCOUNTER — Emergency Department: Payer: Medicare HMO

## 2022-03-17 ENCOUNTER — Encounter: Payer: Self-pay | Admitting: Emergency Medicine

## 2022-03-17 DIAGNOSIS — M109 Gout, unspecified: Secondary | ICD-10-CM

## 2022-03-17 LAB — URIC ACID: Uric Acid, Serum: 8.8 mg/dL — ABNORMAL HIGH (ref 2.5–7.1)

## 2022-03-17 LAB — CBC WITH DIFFERENTIAL/PLATELET
Abs Immature Granulocytes: 0.02 10*3/uL (ref 0.00–0.07)
Basophils Absolute: 0 10*3/uL (ref 0.0–0.1)
Basophils Relative: 0 %
Eosinophils Absolute: 0.2 10*3/uL (ref 0.0–0.5)
Eosinophils Relative: 3 %
HCT: 38 % (ref 36.0–46.0)
Hemoglobin: 12.3 g/dL (ref 12.0–15.0)
Immature Granulocytes: 0 %
Lymphocytes Relative: 28 %
Lymphs Abs: 1.9 10*3/uL (ref 0.7–4.0)
MCH: 29.4 pg (ref 26.0–34.0)
MCHC: 32.4 g/dL (ref 30.0–36.0)
MCV: 90.9 fL (ref 80.0–100.0)
Monocytes Absolute: 0.8 10*3/uL (ref 0.1–1.0)
Monocytes Relative: 12 %
Neutro Abs: 3.8 10*3/uL (ref 1.7–7.7)
Neutrophils Relative %: 57 %
Platelets: 166 10*3/uL (ref 150–400)
RBC: 4.18 MIL/uL (ref 3.87–5.11)
RDW: 14.2 % (ref 11.5–15.5)
WBC: 6.8 10*3/uL (ref 4.0–10.5)
nRBC: 0 % (ref 0.0–0.2)

## 2022-03-17 LAB — COMPREHENSIVE METABOLIC PANEL
ALT: 13 U/L (ref 0–44)
AST: 17 U/L (ref 15–41)
Albumin: 3.6 g/dL (ref 3.5–5.0)
Alkaline Phosphatase: 67 U/L (ref 38–126)
Anion gap: 7 (ref 5–15)
BUN: 19 mg/dL (ref 8–23)
CO2: 28 mmol/L (ref 22–32)
Calcium: 9.1 mg/dL (ref 8.9–10.3)
Chloride: 107 mmol/L (ref 98–111)
Creatinine, Ser: 1.11 mg/dL — ABNORMAL HIGH (ref 0.44–1.00)
GFR, Estimated: 55 mL/min — ABNORMAL LOW (ref >60)
Glucose, Bld: 117 mg/dL — ABNORMAL HIGH (ref 70–99)
Potassium: 3.4 mmol/L — ABNORMAL LOW (ref 3.5–5.1)
Sodium: 142 mmol/L (ref 135–145)
Total Bilirubin: 0.7 mg/dL (ref 0.3–1.2)
Total Protein: 7.4 g/dL (ref 6.5–8.1)

## 2022-03-17 MED ORDER — OXYCODONE-ACETAMINOPHEN 5-325 MG PO TABS: 1.0000 | ORAL_TABLET | ORAL | 0 refills | Status: DC | PRN

## 2022-03-17 MED ORDER — OXYCODONE-ACETAMINOPHEN 5-325 MG PO TABS
1.0000 | ORAL_TABLET | Freq: Once | ORAL | Status: AC
  Administered 2022-03-17: 1 via ORAL
  Filled 2022-03-17: qty 1

## 2022-03-17 NOTE — ED Triage Notes (Signed)
Pt presents via POV with complaints of right foot pain starting yesterday - pt ambulates with a cane. She notes she was told in the past she had gout but hasn't revisited the topic with her PCP. Pt states that the pain feels like "its throbbing." Mild swelling to the right foot compared to the left.

## 2022-03-17 NOTE — ED Provider Notes (Signed)
[facility] Provider Note    Event Date/Time   First MD Initiated Contact with Patient 03/17/22 [phone]     (approximate)   History   Foot Pain   HPI  Alyssa Leonard is a 66 y.o. female who presents to the ED from home with a chief complaint of nontraumatic right foot pain x1 day.  Patient ambulates with a cane at baseline.  Reports pain to lateral and dorsal right foot.  History of gout several years ago.  Denies fever, chest pain, shortness of breath, abdominal pain, nausea or vomiting     Past Medical History   Past Medical History:  Diagnosis Date   Asthma    COPD (chronic obstructive pulmonary disease) ([place])    Diabetes mellitus without complication (HCC)    GERD (gastroesophageal reflux disease)    History of colon polyps    Hypertension    Meniere disease    Sleep apnea    Went for sleep study test in January 2018 "I haven't heard back from test"     Active Problem List  There are no problems to display for this patient.    Past Surgical History   Past Surgical History:  Procedure Laterality Date   ABDOMINAL HYSTERECTOMY     BARIATRIC SURGERY     CHOLECYSTECTOMY     COLONOSCOPY WITH PROPOFOL N/A 12/11/2016   Procedure: COLONOSCOPY WITH PROPOFOL;  Surgeon: Manya Silvas, MD;  Location: [facility] ENDOSCOPY;  Service: Endoscopy;  Laterality: N/A;   COLONOSCOPY WITH PROPOFOL N/A 11/27/2021   Procedure: COLONOSCOPY WITH PROPOFOL;  Surgeon: Lesly Rubenstein, MD;  Location: [facility] ENDOSCOPY;  Service: Endoscopy;  Laterality: N/A;     Home Medications   Prior to Admission medications   Medication Sig Start Date End Date Taking? Authorizing Provider  oxyCODONE-acetaminophen (PERCOCET/ROXICET) 5-325 MG tablet Take 1 tablet by mouth every 4 (four) hours as needed for severe pain. 03/17/22  Yes Paulette Blanch, MD  albuterol (PROVENTIL HFA;VENTOLIN HFA) 108 (90 Base) MCG/ACT inhaler Inhale 2 puffs into the lungs every 6 (six) hours as needed for  wheezing or shortness of breath.    [provider]  amLODipine (NORVASC) 5 MG tablet Take 5 mg by mouth daily. Patient not taking: Reported on 11/27/2021    [provider]  Brinzolamide-Brimonidine 1-0.2 % SUSP Apply to eye 2 (two) times daily.    [provider]  celecoxib (CELEBREX) 200 MG capsule Take 200 mg by mouth 2 (two) times daily.    [provider]  Cholecalciferol (VITAMIN D) 2000 units CAPS Take by mouth.    [provider]  diazepam (VALIUM) 2 MG tablet Take 2 mg by mouth daily.    [provider]  docusate sodium (COLACE) 50 MG capsule Take 50 mg by mouth daily.    [provider]  doxycycline (VIBRA-TABS) 100 MG tablet Take 100 mg by mouth 2 (two) times daily.    [provider]  Fluticasone Furoate 50 MCG/ACT AEPB Inhale into the lungs.    [provider]  Insulin Pen Needle 31G X 5 MM MISC by Does not apply route. Patient not taking: Reported on 11/27/2021    [provider]  latanoprost (XALATAN) 0.005 % ophthalmic solution 1 drop at bedtime.    [provider]  liraglutide (VICTOZA) 18 MG/3ML SOPN Inject 18 mg into the skin daily.    [provider]  losartan-hydrochlorothiazide (HYZAAR) 50-12.5 MG tablet Take 1 tablet by mouth daily.  [provider]  meclizine (ANTIVERT) 25 MG tablet Take 25 mg by mouth 3 (three) times daily as needed for dizziness.    [provider]  meloxicam (MOBIC) 7.5 MG tablet Take 7.5 mg by mouth daily.    [provider]  metoprolol succinate (TOPROL-XL) 25 MG 24 hr tablet Take 25 mg by mouth daily. Patient not taking: Reported on 11/27/2021    [provider]  ondansetron (ZOFRAN) 4 MG tablet Take 4 mg by mouth every 8 (eight) hours as needed for nausea or vomiting.    [provider]  pantoprazole (PROTONIX) 40 MG tablet Take 40 mg by mouth daily. Patient not taking: Reported on 11/27/2021     [provider]  Polyethylene Glycol POWD by Does not apply route.    [provider]  triamcinolone acetonide 40 MG/ML SUSP 40 mg, mupirocin cream 2 % CREA 15 g Apply 1 application topically 3 (three) times daily.    [provider]     Allergies  Aspirin and Latex   Family History   Family History  Problem Relation Age of Onset   Hypertension Mother    Kidney disease Sister    Kidney disease Brother    Heart disease Brother    Heart disease Brother    Breast cancer Neg Hx      Physical Exam  Triage Vital Signs: ED Triage Vitals  Enc Vitals Group     BP 03/17/22 0004 (!) 157/105     Pulse Rate 03/17/22 0004 (!) 107     Resp 03/17/22 0004 20     Temp 03/17/22 0004 98.9 F (37.2 C)     Temp Source 03/17/22 0004 Oral     SpO2 03/17/22 0004 95 %     Weight 03/17/22 0007 300 lb (136.1 kg)     Height 03/17/22 0007 5' (1.524 m)     Head Circumference --      Peak Flow --      Pain Score 03/17/22 0002 10     Pain Loc --      Pain Edu? --      Excl. in [place]? --     Updated Vital Signs: BP (!) 157/105 (BP Location: Left Arm)   Pulse (!) 107   Temp 98.9 F (37.2 C) (Oral)   Resp 20   Ht 5' (1.524 m)   Wt 136.1 kg   SpO2 95%   BMI 58.59 kg/m    General: Awake, no distress.  CV:  RRR.  Good peripheral perfusion.  Resp:  Normal effort.  CTA B. Abd:  No distention.  Other:  Right foot: Mildly swollen compared to the left.  Mild warmth to the lateral aspect.  Full range of motion.  2+ distal pulses.  Brisk, less than 5-second capillary refill.   ED Results / Procedures / Treatments  Labs (all labs ordered are listed, but only abnormal results are displayed) Labs Reviewed  COMPREHENSIVE METABOLIC PANEL - Abnormal; Notable for the following components:      Result Value   Potassium 3.4 (*)    Glucose, Bld 117 (*)    Creatinine, Ser 1.11 (*)    GFR, Estimated 55 (*)    All other components within normal limits  URIC ACID - Abnormal;  Notable for the following components:   Uric Acid, Serum 8.8 (*)    All other components within normal limits  CBC WITH DIFFERENTIAL/PLATELET     EKG  None   RADIOLOGY  I have independently visualized and interpreted patient's x-rays as well as noted the radiology interpretation:  Right ankle and foot x-rays: Edema, no acute osseous abnormality  Official radiology report(s): DG Ankle Complete Right  Result Date: 03/17/2022 CLINICAL DATA:  Right ankle and foot pain. EXAM: RIGHT FOOT COMPLETE - 3+ VIEW; RIGHT ANKLE - COMPLETE 3+ VIEW COMPARISON:  None Available. FINDINGS: Right ankle: There is diffuse edema in the distal foreleg continued into the ankle where it is more prominent. There are subcutaneous vascular calcifications in the anterior and lateral distal foreleg. Bone mineralization is normal. There is no evidence of fracture or dislocation. There is mild arthrosis of the ankle with mild spurring change of the medial tibiotalar joint at the malleolus. There are small plantar and posterior calcaneal enthesophytes. Right foot: There is moderate edema in the foot. Normal bone mineralization without evidence of fractures. There is mild arthrosis in the midfoot and the toe joints. Interosseous alignment is normal. There is a small accessory navicular bone. IMPRESSION: 1. Diffuse edema without acute underlying bone abnormality. 2. Enthesopathic and degenerative changes. Electronically Signed   By: Telford Nab M.D.   On: 03/17/2022 01:04   DG Foot Complete Right  Result Date: 03/17/2022 CLINICAL DATA:  Right ankle and foot pain. EXAM: RIGHT FOOT COMPLETE - 3+ VIEW; RIGHT ANKLE - COMPLETE 3+ VIEW COMPARISON:  None Available. FINDINGS: Right ankle: There is diffuse edema in the distal foreleg continued into the ankle where it is more prominent. There are subcutaneous vascular calcifications in the anterior and lateral distal foreleg. Bone mineralization is normal. There is no evidence of  fracture or dislocation. There is mild arthrosis of the ankle with mild spurring change of the medial tibiotalar joint at the malleolus. There are small plantar and posterior calcaneal enthesophytes. Right foot: There is moderate edema in the foot. Normal bone mineralization without evidence of fractures. There is mild arthrosis in the midfoot and the toe joints. Interosseous alignment is normal. There is a small accessory navicular bone. IMPRESSION: 1. Diffuse edema without acute underlying bone abnormality. 2. Enthesopathic and degenerative changes. Electronically Signed   By: Telford Nab M.D.   On: 03/17/2022 01:04     PROCEDURES:  Critical Care performed: No  Procedures   MEDICATIONS ORDERED IN ED: Medications  oxyCODONE-acetaminophen (PERCOCET/ROXICET) 5-325 MG per tablet 1 tablet (has no administration in time range)     IMPRESSION / MDM / ASSESSMENT AND PLAN / ED COURSE  I reviewed the triage vital signs and the nursing notes.                             66 year old female presenting with nontraumatic right foot pain.  Differential diagnosis includes but is not limited to fracture, musculoskeletal pain, sprain, infectious, inflammatory etiologies, etc.  I have personally reviewed patient's records and see a recent visit to GYN on 02/21/2022 for trichomonal vaginitis.  Laboratory salts remarkable for normal WBC, mild elevation in creatinine; uric acid is elevated.  Will place in podiatric shoe, provide walker, administer Percocet for pain.  Patient will follow-up closely with her PCP.  Strict return precautions given.  Patient and spouse verbalized understanding agree with plan of care.      FINAL CLINICAL IMPRESSION(S) / ED DIAGNOSES   Final diagnoses:  Acute gout of right foot, unspecified cause     Rx / DC Orders   ED Discharge Orders  Ordered    oxyCODONE-acetaminophen (PERCOCET/ROXICET) 5-325 MG tablet  Every 4 hours PRN        03/17/22 0155              Note:  This document was prepared using Dragon voice recognition software and may include unintentional dictation errors.   Paulette Blanch, MD 03/17/22 [phone]

## 2022-03-17 NOTE — ED Notes (Signed)
Walker given to patient.

## 2022-03-17 NOTE — Discharge Instructions (Addendum)
1.You may take Percocet as needed for pain. 2.  Use walker to help you balance as you walk. 3.  You may wear podiatric shoe as needed for comfort. 4.  Return to the ER for worsening symptoms, persistent vomiting, difficulty breathing or other concerns.

## 2022-06-17 ENCOUNTER — Other Ambulatory Visit: Payer: Self-pay | Admitting: Internal Medicine

## 2022-06-17 DIAGNOSIS — Z1231 Encounter for screening mammogram for malignant neoplasm of breast: Secondary | ICD-10-CM

## 2022-07-17 ENCOUNTER — Ambulatory Visit
Admission: RE | Admit: 2022-07-17 | Discharge: 2022-07-17 | Disposition: A | Discharge status: Home / Self Care | Payer: Medicare HMO | Source: Ambulatory Visit | Attending: Internal Medicine | Admitting: Internal Medicine

## 2022-07-17 DIAGNOSIS — Z1231 Encounter for screening mammogram for malignant neoplasm of breast: Secondary | ICD-10-CM | POA: Insufficient documentation

## 2023-06-17 ENCOUNTER — Other Ambulatory Visit: Payer: Self-pay | Admitting: Internal Medicine

## 2023-06-17 DIAGNOSIS — Z1231 Encounter for screening mammogram for malignant neoplasm of breast: Secondary | ICD-10-CM

## 2023-07-02 ENCOUNTER — Other Ambulatory Visit: Payer: Self-pay | Admitting: Internal Medicine

## 2023-07-02 DIAGNOSIS — I10 Essential (primary) hypertension: Secondary | ICD-10-CM

## 2023-07-02 DIAGNOSIS — Z841 Family history of disorders of kidney and ureter: Secondary | ICD-10-CM

## 2023-07-10 ENCOUNTER — Ambulatory Visit
Admission: RE | Admit: 2023-07-10 | Discharge: 2023-07-10 | Disposition: A | Discharge status: Home / Self Care | Payer: Medicare Other | Source: Ambulatory Visit | Attending: Internal Medicine | Admitting: Internal Medicine

## 2023-07-10 DIAGNOSIS — N1339 Other hydronephrosis: Secondary | ICD-10-CM | POA: Insufficient documentation

## 2023-07-10 DIAGNOSIS — Z841 Family history of disorders of kidney and ureter: Secondary | ICD-10-CM | POA: Insufficient documentation

## 2023-07-10 DIAGNOSIS — N2889 Other specified disorders of kidney and ureter: Secondary | ICD-10-CM | POA: Insufficient documentation

## 2023-07-10 DIAGNOSIS — I10 Essential (primary) hypertension: Secondary | ICD-10-CM | POA: Insufficient documentation

## 2023-07-21 ENCOUNTER — Ambulatory Visit
Admission: RE | Admit: 2023-07-21 | Discharge: 2023-07-21 | Disposition: A | Discharge status: Home / Self Care | Payer: Medicare Other | Source: Ambulatory Visit | Attending: Internal Medicine | Admitting: Internal Medicine

## 2023-07-21 DIAGNOSIS — Z1231 Encounter for screening mammogram for malignant neoplasm of breast: Secondary | ICD-10-CM | POA: Diagnosis present

## 2023-07-28 ENCOUNTER — Other Ambulatory Visit: Payer: Self-pay | Admitting: Internal Medicine

## 2023-07-28 DIAGNOSIS — N2889 Other specified disorders of kidney and ureter: Secondary | ICD-10-CM

## 2023-07-28 DIAGNOSIS — N1330 Unspecified hydronephrosis: Secondary | ICD-10-CM

## 2023-07-30 ENCOUNTER — Other Ambulatory Visit: Payer: Self-pay | Admitting: Internal Medicine

## 2023-07-30 DIAGNOSIS — I739 Peripheral vascular disease, unspecified: Secondary | ICD-10-CM

## 2023-08-05 ENCOUNTER — Ambulatory Visit
Admission: RE | Admit: 2023-08-05 | Discharge: 2023-08-05 | Disposition: A | Discharge status: Home / Self Care | Payer: Medicare Other | Source: Ambulatory Visit | Attending: Internal Medicine | Admitting: Internal Medicine

## 2023-08-05 DIAGNOSIS — N1330 Unspecified hydronephrosis: Secondary | ICD-10-CM | POA: Insufficient documentation

## 2023-08-05 DIAGNOSIS — N2889 Other specified disorders of kidney and ureter: Secondary | ICD-10-CM | POA: Insufficient documentation

## 2023-08-05 MED ORDER — GADOBUTROL 1 MMOL/ML IV SOLN
10.0000 mL | Freq: Once | INTRAVENOUS | Status: AC | PRN
  Administered 2023-08-05: 10 mL via INTRAVENOUS

## 2023-08-12 ENCOUNTER — Other Ambulatory Visit: Payer: Medicare Other

## 2023-08-12 ENCOUNTER — Other Ambulatory Visit: Payer: Self-pay | Admitting: Internal Medicine

## 2023-08-12 ENCOUNTER — Ambulatory Visit
Admission: RE | Admit: 2023-08-12 | Discharge: 2023-08-12 | Disposition: A | Discharge status: Home / Self Care | Payer: Medicare Other | Source: Ambulatory Visit | Attending: Internal Medicine | Admitting: Internal Medicine

## 2023-08-12 DIAGNOSIS — I739 Peripheral vascular disease, unspecified: Secondary | ICD-10-CM | POA: Insufficient documentation

## 2023-08-12 DIAGNOSIS — C649 Malignant neoplasm of unspecified kidney, except renal pelvis: Secondary | ICD-10-CM

## 2023-08-15 ENCOUNTER — Inpatient Hospital Stay: Payer: Medicare Other

## 2023-08-15 ENCOUNTER — Encounter: Payer: Self-pay | Admitting: Oncology

## 2023-08-15 ENCOUNTER — Telehealth: Payer: Self-pay

## 2023-08-15 ENCOUNTER — Inpatient Hospital Stay: Payer: Medicare Other | Attending: Oncology | Admitting: Oncology

## 2023-08-15 VITALS — BP 124/63 | HR 64 | Temp 96.4°F | Resp 18 | Ht 60.0 in | Wt 316.3 lb

## 2023-08-15 DIAGNOSIS — Z87891 Personal history of nicotine dependence: Secondary | ICD-10-CM | POA: Insufficient documentation

## 2023-08-15 DIAGNOSIS — N2889 Other specified disorders of kidney and ureter: Secondary | ICD-10-CM | POA: Diagnosis present

## 2023-08-15 DIAGNOSIS — C649 Malignant neoplasm of unspecified kidney, except renal pelvis: Secondary | ICD-10-CM

## 2023-08-15 LAB — CBC WITH DIFFERENTIAL (CANCER CENTER ONLY)
Abs Immature Granulocytes: 0.01 10*3/uL (ref 0.00–0.07)
Basophils Absolute: 0 10*3/uL (ref 0.0–0.1)
Basophils Relative: 0 %
Eosinophils Absolute: 0.2 10*3/uL (ref 0.0–0.5)
Eosinophils Relative: 4 %
HCT: 36.6 % (ref 36.0–46.0)
Hemoglobin: 12.2 g/dL (ref 12.0–15.0)
Immature Granulocytes: 0 %
Lymphocytes Relative: 41 %
Lymphs Abs: 2.3 10*3/uL (ref 0.7–4.0)
MCH: 28.9 pg (ref 26.0–34.0)
MCHC: 33.3 g/dL (ref 30.0–36.0)
MCV: 86.7 fL (ref 80.0–100.0)
Monocytes Absolute: 0.7 10*3/uL (ref 0.1–1.0)
Monocytes Relative: 13 %
Neutro Abs: 2.4 10*3/uL (ref 1.7–7.7)
Neutrophils Relative %: 42 %
Platelet Count: 181 10*3/uL (ref 150–400)
RBC: 4.22 MIL/uL (ref 3.87–5.11)
RDW: 13.3 % (ref 11.5–15.5)
WBC Count: 5.7 10*3/uL (ref 4.0–10.5)
nRBC: 0 % (ref 0.0–0.2)

## 2023-08-15 LAB — CMP (CANCER CENTER ONLY)
ALT: 12 U/L (ref 0–44)
AST: 16 U/L (ref 15–41)
Albumin: 3.8 g/dL (ref 3.5–5.0)
Alkaline Phosphatase: 71 U/L (ref 38–126)
Anion gap: 8 (ref 5–15)
BUN: 17 mg/dL (ref 8–23)
CO2: 25 mmol/L (ref 22–32)
Calcium: 9.3 mg/dL (ref 8.9–10.3)
Chloride: 106 mmol/L (ref 98–111)
Creatinine: 1.01 mg/dL — ABNORMAL HIGH (ref 0.44–1.00)
GFR, Estimated: >60 mL/min (ref >60)
Glucose, Bld: 107 mg/dL — ABNORMAL HIGH (ref 70–99)
Potassium: 3.2 mmol/L — ABNORMAL LOW (ref 3.5–5.1)
Sodium: 139 mmol/L (ref 135–145)
Total Bilirubin: 0.6 mg/dL (ref 0.3–1.2)
Total Protein: 7.4 g/dL (ref 6.5–8.1)

## 2023-08-15 LAB — LACTATE DEHYDROGENASE: LDH: 159 U/L (ref 98–192)

## 2023-08-15 NOTE — Progress Notes (Signed)
Hematology/Oncology Consult note [facility] Telephone:([phone] [phone]  Patient Care Team: Entzminger, Danton Clap, MD as PCP - General (Internal Medicine) Creig Hines, MD as Consulting Physician (Oncology)   Name of the patient: Alyssa Leonard  [id number]  [birth year]    Reason for referral-renal mass   Referring physician-Dr. Sherrin Daisy  Date of visit: 08/15/23   History of presenting illness-patient is a 67 year old female with a past medical history significant for hypertension hyperlipidemia type 2 diabetes sleep apnea obesity among other medical problems.  States that she has a family history of kidney disease and therefore mentioned this to her primary care doctor.  This led to an ultrasound in September 2024 which showed a hypoechoic masslike area in the inferior pole of the right kidney.  This was followed by MRI abdomen with and without contrast on 08/05/2023 which showed a large exophytic mass arising from the anterior midportion and inferior portion of the right kidney measuring 8.9 x 8.6 x 7.8 cm.  Heterogeneous intrinsic T1 hyperintensity and internal signal dropout consistent with prior hemorrhage and hemosiderosis.  Mass very closely abuts the adjacent overlying descending duodenum without a distinguishable separating fat plane.  No renal vein invasion.  Multiple simple bilateral renal cysts.  No evidence of enlarged abdominal lymph nodes.  No acute osseous findings.  At baseline patient is independent of her ADLs and IADLs.  He denies any abdominal pain or blood in her urine presently.  ECOG PS- 1  Pain scale- 0   Review of systems- Review of Systems  Constitutional:  Negative for chills, fever, malaise/fatigue and weight loss.  HENT:  Negative for congestion, ear discharge and nosebleeds.   Eyes:  Negative for blurred vision.  Respiratory:  Negative for cough, hemoptysis, sputum production, shortness of breath and wheezing.    Cardiovascular:  Negative for chest pain, palpitations, orthopnea and claudication.  Gastrointestinal:  Negative for abdominal pain, blood in stool, constipation, diarrhea, heartburn, melena, nausea and vomiting.  Genitourinary:  Negative for dysuria, flank pain, frequency, hematuria and urgency.  Musculoskeletal:  Negative for back pain, joint pain and myalgias.  Skin:  Negative for rash.  Neurological:  Negative for dizziness, tingling, focal weakness, seizures, weakness and headaches.  Endo/Heme/Allergies:  Does not bruise/bleed easily.  Psychiatric/Behavioral:  Negative for depression and suicidal ideas. The patient does not have insomnia.     Allergies  Allergen Reactions   Aspirin Other (See Comments)   Latex Other (See Comments)    unknown    There are no problems to display for this patient.    Past Medical History:  Diagnosis Date   Asthma    COPD (chronic obstructive pulmonary disease) (HCC)    Diabetes mellitus without complication (HCC)    GERD (gastroesophageal reflux disease)    History of colon polyps    Hypertension    Meniere disease    Sleep apnea    Went for sleep study test in January 2018 "I haven't heard back from test"     Past Surgical History:  Procedure Laterality Date   ABDOMINAL HYSTERECTOMY     BARIATRIC SURGERY     CHOLECYSTECTOMY     COLONOSCOPY WITH PROPOFOL N/A 12/11/2016   Procedure: COLONOSCOPY WITH PROPOFOL;  Surgeon: Scot Jun, MD;  Location: [facility];  Service: Endoscopy;  Laterality: N/A;   COLONOSCOPY WITH PROPOFOL N/A 11/27/2021   Procedure: COLONOSCOPY WITH PROPOFOL;  Surgeon: Regis Bill, MD;  Location: [facility] ENDOSCOPY;  Service: Endoscopy;  Laterality: N/A;  Social History   Socioeconomic History   Marital status: Married    Spouse name: Not on file   Number of children: Not on file   Years of education: Not on file   Highest education level: Not on file  Occupational History   Not on file   Tobacco Use   Smoking status: Former    Current packs/day: 0.00    Types: Cigarettes    Quit date: 11/14/1993    Years since quitting: 29.7   Smokeless tobacco: Never  Vaping Use   Vaping status: Never Used  Substance and Sexual Activity   Alcohol use: No   Drug use: No   Sexual activity: Not Currently  Other Topics Concern   Not on file  Social History Narrative   Not on file   Social Determinants of Health   Financial Resource Strain: Not on file  Food Insecurity: No Food Insecurity (08/15/2023)   Hunger Vital Sign    Worried About Running Out of Food in the Last Year: Never true    Ran Out of Food in the Last Year: Never true  Transportation Needs: No Transportation Needs (08/15/2023)   PRAPARE - Administrator, Civil Service (Medical): No    Lack of Transportation (Non-Medical): No  Physical Activity: Not on file  Stress: Not on file  Social Connections: Unknown (03/11/2022)   Received from [facility], [facility]   Social Network    Social Network: Not on file  Intimate Partner Violence: Not At Risk (08/15/2023)   Humiliation, Afraid, Rape, and Kick questionnaire    Fear of Current or Ex-Partner: No    Emotionally Abused: No    Physically Abused: No    Sexually Abused: No     Family History  Problem Relation Age of Onset   Hypertension Mother    Kidney disease Sister    Kidney disease Brother    Heart disease Brother    Heart disease Brother    Breast cancer Neg Hx      Current Outpatient Medications:    albuterol (PROVENTIL HFA;VENTOLIN HFA) 108 (90 Base) MCG/ACT inhaler, Inhale 2 puffs into the lungs every 6 (six) hours as needed for wheezing or shortness of breath., Disp: , Rfl:    amLODipine (NORVASC) 5 MG tablet, Take 5 mg by mouth daily., Disp: , Rfl:    Brinzolamide-Brimonidine 1-0.2 % SUSP, Apply to eye 2 (two) times daily., Disp: , Rfl:    diazepam (VALIUM) 2 MG tablet, Take 2 mg by mouth daily., Disp: , Rfl:    docusate sodium  (COLACE) 50 MG capsule, Take 50 mg by mouth daily., Disp: , Rfl:    Fluticasone Furoate 50 MCG/ACT AEPB, Inhale into the lungs., Disp: , Rfl:    latanoprost (XALATAN) 0.005 % ophthalmic solution, 1 drop at bedtime., Disp: , Rfl:    losartan-hydrochlorothiazide (HYZAAR) 50-12.5 MG tablet, Take 1 tablet by mouth daily., Disp: , Rfl:    meclizine (ANTIVERT) 25 MG tablet, Take 25 mg by mouth 3 (three) times daily as needed for dizziness., Disp: , Rfl:    metoprolol succinate (TOPROL-XL) 25 MG 24 hr tablet, Take 25 mg by mouth daily., Disp: , Rfl:    ondansetron (ZOFRAN) 4 MG tablet, Take 4 mg by mouth every 8 (eight) hours as needed for nausea or vomiting., Disp: , Rfl:    pantoprazole (PROTONIX) 40 MG tablet, Take 40 mg by mouth daily., Disp: , Rfl:    celecoxib (CELEBREX) 200 MG capsule, Take  200 mg by mouth 2 (two) times daily. (Patient not taking: Reported on 08/15/2023), Disp: , Rfl:    Cholecalciferol (VITAMIN D) 2000 units CAPS, Take by mouth. (Patient not taking: Reported on 08/15/2023), Disp: , Rfl:    doxycycline (VIBRA-TABS) 100 MG tablet, Take 100 mg by mouth 2 (two) times daily. (Patient not taking: Reported on 08/15/2023), Disp: , Rfl:    Insulin Pen Needle 31G X 5 MM MISC, by Does not apply route. (Patient not taking: Reported on 11/27/2021), Disp: , Rfl:    liraglutide (VICTOZA) 18 MG/3ML SOPN, Inject 18 mg into the skin daily. (Patient not taking: Reported on 08/15/2023), Disp: , Rfl:    meloxicam (MOBIC) 7.5 MG tablet, Take 7.5 mg by mouth daily. (Patient not taking: Reported on 08/15/2023), Disp: , Rfl:    oxyCODONE-acetaminophen (PERCOCET/ROXICET) 5-325 MG tablet, Take 1 tablet by mouth every 4 (four) hours as needed for severe pain. (Patient not taking: Reported on 08/15/2023), Disp: 15 tablet, Rfl: 0   Polyethylene Glycol POWD, by Does not apply route. (Patient not taking: Reported on 08/15/2023), Disp: , Rfl:    triamcinolone acetonide 40 MG/ML SUSP 40 mg, mupirocin cream 2 % CREA  15 g, Apply 1 application topically 3 (three) times daily. (Patient not taking: Reported on 08/15/2023), Disp: , Rfl:    Physical exam:  Vitals:   08/15/23 1057  BP: 124/63  Pulse: 64  Resp: 18  Temp: (!) 96.4 F (35.8 C)  TempSrc: Tympanic  SpO2: 100%  Weight: (!) 316 lb 4.8 oz (143.5 kg)  Height: 5' (1.524 m)   Physical Exam Constitutional:      General: She is not in acute distress.    Appearance: She is obese.  Cardiovascular:     Rate and Rhythm: Normal rate and regular rhythm.     Heart sounds: Normal heart sounds.  Pulmonary:     Effort: Pulmonary effort is normal.     Breath sounds: Normal breath sounds.  Abdominal:     General: Bowel sounds are normal.     Palpations: Abdomen is soft.  Skin:    General: Skin is warm and dry.  Neurological:     Mental Status: She is alert and oriented to person, place, and time.           Latest Ref Rng & Units 08/15/2023   11:47 AM  CMP  Glucose 70 - 99 mg/dL 132   BUN 8 - 23 mg/dL 17   Creatinine 4.40 - 1.00 mg/dL 1.02   Sodium 725 - 366 mmol/L 139   Potassium 3.5 - 5.1 mmol/L 3.2   Chloride 98 - 111 mmol/L 106   CO2 22 - 32 mmol/L 25   Calcium 8.9 - 10.3 mg/dL 9.3   Total Protein 6.5 - 8.1 g/dL 7.4   Total Bilirubin 0.3 - 1.2 mg/dL 0.6   Alkaline Phos 38 - 126 U/L 71   AST 15 - 41 U/L 16   ALT 0 - 44 U/L 12       Latest Ref Rng & Units 08/15/2023   11:47 AM  CBC  WBC 4.0 - 10.5 K/uL 5.7   Hemoglobin 12.0 - 15.0 g/dL 44.0   Hematocrit 34.7 - 46.0 % 36.6   Platelets 150 - 400 K/uL 181     No images are attached to the encounter.  US ARTERIAL ABI (SCREENING LOWER EXTREMITY)  Result Date: 08/14/2023 CLINICAL DATA:  67 year old female with peripheral arterial disease EXAM: NONINVASIVE PHYSIOLOGIC VASCULAR STUDY OF BILATERAL  LOWER EXTREMITIES TECHNIQUE: Evaluation of both lower extremities was performed at rest, including calculation of ankle-brachial indices, multiple segmental pressure evaluation,  segmental Doppler and segmental pulse volume recording. COMPARISON:  None Available. FINDINGS: Right ABI:  1.2 Left ABI:  1.26 Right Lower Extremity: Segmental Doppler at the right ankle demonstrates triphasic dorsalis pedis and biphasic posterior tibial artery. Left Lower Extremity: Segmental Doppler at the left ankle demonstrates triphasic waveforms IMPRESSION: Resting ABI of the bilateral lower extremity within normal limits. Segmental exam performed at the ankles demonstrates waveforms maintained Signed, Yvone Neu. Miachel Roux, RPVI [facility] Electronically Signed   By: Gilmer Mor D.O.   On: 08/14/2023 08:43   MR ABDOMEN WWO CONTRAST  Result Date: 08/08/2023 CLINICAL DATA:  Characterize suspected right renal mass identified by prior ultrasound EXAM: MRI ABDOMEN WITHOUT AND WITH CONTRAST TECHNIQUE: Multiplanar multisequence MR imaging of the abdomen was performed both before and after the administration of intravenous contrast. CONTRAST:  10mL GADAVIST GADOBUTROL 1 MMOL/ML IV SOLN COMPARISON:  Renal ultrasound, 07/10/2023 FINDINGS: Lower chest: No acute abnormality. Hepatobiliary: No focal liver abnormality is seen. Status post cholecystectomy. No biliary dilatation. Pancreas: Fluid signal cystic lesions of the pancreatic head and uncinate measuring up to 0.9 cm (series 4, image 20). No solid component or suspicious contrast enhancement. No pancreatic ductal dilatation or surrounding inflammatory changes. Spleen: Normal in size without significant abnormality. Adrenals/Urinary Tract: Adrenal glands are unremarkable. Large, exophytic mass arising from the anterior midportion and inferior pole of the right kidney measuring 8.9 x 8.6 x 7.8 cm (series 17, image 47, series 23, image 35). Heterogeneous intrinsic T1 hyperintensity and internal signal dropout consistent with prior hemorrhage and hemosiderosis. Mass very closely abuts the adjacent  overlying descending duodenum without a distinguishable separating fat plane (series 17, image 43, series 23, image 42). No renal vein invasion. Multiple additional simple, benign bilateral renal cortical cysts, for which no specific further follow-up or characterization is required. Stomach/Bowel: Stomach is within normal limits. No evidence of bowel wall thickening, distention, or inflammatory changes. Vascular/Lymphatic: No significant vascular findings are present. No enlarged abdominal lymph nodes. Other: No abdominal wall hernia or abnormality. No ascites. Musculoskeletal: No acute or significant osseous findings. IMPRESSION: 1. Large, exophytic mass arising from the anterior midportion and inferior pole of the right kidney measuring 8.9 x 8.6 x 7.8 cm. Heterogeneous intrinsic T1 hyperintensity and internal signal dropout consistent with prior hemorrhage and hemosiderosis. 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 2. Mass very closely abuts the adjacent overlying descending duodenum without a distinguishable separating fat plane, concerning for direct local involvement. 3. No evidence of renal vein invasion, lymphadenopathy or distant metastatic disease in the abdomen. 4. Fluid signal cystic lesions of the pancreatic head and uncinate measuring up to 0.9 cm, consistent with small pseudocysts or side branch IPMNs. As there is no observed increased risk of malignancy for such lesions smaller than 2 cm, particularly in the setting of other known primary malignancy, no specific further follow-up or characterization is required. These results will be called to the ordering clinician or representative by the Radiologist Assistant, and communication documented in the PACS or [employer]. Electronically Signed   By: Jearld Lesch M.D.   On: 08/08/2023 21:40   MM 3D SCREENING MAMMOGRAM BILATERAL BREAST  Result Date: 07/22/2023 CLINICAL DATA:  Screening. EXAM: DIGITAL SCREENING BILATERAL  MAMMOGRAM WITH TOMOSYNTHESIS AND CAD TECHNIQUE: Bilateral screening digital craniocaudal and mediolateral oblique mammograms were obtained. Bilateral screening  digital breast tomosynthesis was performed. The images were evaluated with computer-aided detection. COMPARISON:  Previous exam(s). ACR Breast Density Category a: The breasts are almost entirely fatty. FINDINGS: There are no findings suspicious for malignancy. IMPRESSION: No mammographic evidence of malignancy. A result letter of this screening mammogram will be mailed directly to the patient. RECOMMENDATION: Screening mammogram in one year. (Code:SM-B-01Y) BI-RADS CATEGORY  1: Negative. Electronically Signed   By: Elberta Fortis M.D.   On: 07/22/2023 10:03    Assessment and plan- Patient is a 67 y.o. female referred for right renal mass  I have reviewed MRI abdomen images independently and discussed findings with the patient.  Patient found to have a large right renal mass measuring 8.6 cm and there is concern for abutment/involvement of the duodenal polyp.  No evidence of local regional adenopathy or distant metastatic disease within the abdomen.  This mass is highly concerning for renal cell carcinoma.  I am obtaining CT chest without contrast and a bone scan to complete staging workup.  Her PET scan needs to be canceled since it is not the appropriate imaging test for renal cell carcinoma.  We will reach out to her PCP about this.  I will discuss her case at tumor board next week but have also reached out to Dr. Apolinar Junes from urology and she will be taking a look at her MRI in 2 days.  If patient is deemed to be upfront resectable then she will go for primary surgery depending on if she can be operated with her BMI here at [employer] [facility]  If she cannot be operated here I will refer her to [facility] or [facility].  If she is not deemed to be upfront resectable I would favor treating her as locally advanced/metastatic disease with combination of pembrolizumab plus  lenvatinib which I will discuss her with in greater detail after I receive feedback from urology.  But if she were to receive any "neoadjuvant"  therapy, she will need tissue diagnosis prior.  F/u with me to be decided  Thank you for this kind referral and the opportunity to participate in the care of this patient   Visit Diagnosis 1. Renal mass     Dr. Owens Shark, MD, MPH [facility] at [facility] [phone] 08/15/2023

## 2023-08-15 NOTE — Telephone Encounter (Signed)
Called PCP asked to cancel PET scan per Dr. Smith Robert

## 2023-08-18 ENCOUNTER — Other Ambulatory Visit: Payer: Self-pay

## 2023-08-18 ENCOUNTER — Telehealth: Payer: Self-pay | Admitting: *Deleted

## 2023-08-18 ENCOUNTER — Ambulatory Visit
Admission: RE | Admit: 2023-08-18 | Discharge: 2023-08-18 | Disposition: A | Discharge status: Home / Self Care | Payer: Medicare Other | Source: Ambulatory Visit | Attending: Oncology | Admitting: Oncology

## 2023-08-18 ENCOUNTER — Telehealth: Payer: Self-pay

## 2023-08-18 DIAGNOSIS — I700 Atherosclerosis of aorta: Secondary | ICD-10-CM | POA: Diagnosis not present

## 2023-08-18 DIAGNOSIS — R918 Other nonspecific abnormal finding of lung field: Secondary | ICD-10-CM | POA: Diagnosis not present

## 2023-08-18 DIAGNOSIS — E042 Nontoxic multinodular goiter: Secondary | ICD-10-CM | POA: Diagnosis not present

## 2023-08-18 DIAGNOSIS — N2889 Other specified disorders of kidney and ureter: Secondary | ICD-10-CM | POA: Insufficient documentation

## 2023-08-18 DIAGNOSIS — Z85528 Personal history of other malignant neoplasm of kidney: Secondary | ICD-10-CM | POA: Insufficient documentation

## 2023-08-18 NOTE — Telephone Encounter (Signed)
Faxed urgent referral to Dr. Delana Meyer Office per Dr. Assunta Gambles request.  Patient notified via phone to anticipate appointment call from their office.

## 2023-08-18 NOTE — Telephone Encounter (Signed)
Per Dr. Assunta Gambles request, the referral to Dr. Apolinar Junes was cancelled and sent to [facility].  I spoke to [place] at [facility] to inform her the referral is urgent.  Osborne Casco states she sent a message to Vernona Rieger who will get the patient's referral.  Called and spoke to patient and explained the change to [facility].  She is agreeable.

## 2023-08-18 NOTE — Telephone Encounter (Signed)
Called 2nd time asking to cancel PET scan per Dr. Fanny Bien to [facility] who says she will pass on the referral coordinator

## 2023-08-19 ENCOUNTER — Ambulatory Visit: Payer: Medicare Other

## 2023-08-20 ENCOUNTER — Telehealth: Payer: Self-pay | Admitting: *Deleted

## 2023-08-20 NOTE — Telephone Encounter (Signed)
Pt concerned that she has not heard about referral to [facility] urology per Dr Smith Robert recommendation.  Nurse instructed patient that referral had been called and faxed to [facility] and they should reach out to her with an appointment soon.  Pt verbalized understanding.

## 2023-08-21 ENCOUNTER — Other Ambulatory Visit: Payer: Medicare Other

## 2023-08-21 ENCOUNTER — Telehealth: Payer: Self-pay

## 2023-08-21 ENCOUNTER — Telehealth: Payer: Self-pay | Admitting: *Deleted

## 2023-08-21 NOTE — Telephone Encounter (Signed)
error 

## 2023-08-21 NOTE — Telephone Encounter (Signed)
**  [phone].  

## 2023-08-21 NOTE — Telephone Encounter (Signed)
[facility] called stating they offered patient an Rapid appointment so she can be seen on Monday. Patient declined because she has a bone scan on Monday. [facility] is calling because they are trying to see if we could reschedule her bone scan because Monday is the only soon as possible date they can offer her, If not, she won't be scheduled until 2 weeks from now. April from [facility] call back # [phone]

## 2023-08-22 ENCOUNTER — Encounter: Payer: Self-pay | Admitting: *Deleted

## 2023-08-26 ENCOUNTER — Other Ambulatory Visit: Payer: Medicare Other

## 2023-08-27 ENCOUNTER — Ambulatory Visit: Payer: Medicare Other

## 2023-08-28 ENCOUNTER — Telehealth: Payer: Self-pay | Admitting: *Deleted

## 2023-08-28 NOTE — Telephone Encounter (Signed)
I am unable to reach out to them with that number. Please give them my cell and ask them to call me. Thank you

## 2023-08-28 NOTE — Telephone Encounter (Signed)
Oswaldo Conroy, NP form [facility] called reporting that a referral from patient pcp was received and they were reading note from Dr Smith Robert that there is a question about a biopsy vs surgery and she is asking for Dr Assunta Gambles opinion on whether they need to see this patient or if he will be pursuing surgery. Please return her call [phone]

## 2023-08-29 ENCOUNTER — Encounter
Admission: RE | Admit: 2023-08-29 | Discharge: 2023-08-29 | Disposition: A | Discharge status: Home / Self Care | Payer: Medicare Other | Source: Ambulatory Visit | Attending: Oncology | Admitting: Oncology

## 2023-08-29 DIAGNOSIS — N2889 Other specified disorders of kidney and ureter: Secondary | ICD-10-CM | POA: Insufficient documentation

## 2023-08-29 MED ORDER — TECHNETIUM TC 99M MEDRONATE IV KIT
20.0000 | PACK | Freq: Once | INTRAVENOUS | Status: AC | PRN
  Administered 2023-08-29: 21.51 via INTRAVENOUS

## 2023-09-19 DIAGNOSIS — D136 Benign neoplasm of pancreas: Secondary | ICD-10-CM | POA: Insufficient documentation

## 2023-10-21 DIAGNOSIS — Z79899 Other long term (current) drug therapy: Secondary | ICD-10-CM | POA: Insufficient documentation

## 2023-11-13 ENCOUNTER — Other Ambulatory Visit: Payer: Self-pay | Admitting: Internal Medicine

## 2023-11-13 DIAGNOSIS — C649 Malignant neoplasm of unspecified kidney, except renal pelvis: Secondary | ICD-10-CM

## 2023-11-19 NOTE — Progress Notes (Signed)
[name], MD sent to Alyssa Leonard Approved for CT guided biopsy of large right lower pole renal mass.    HKM

## 2023-11-20 DIAGNOSIS — Z8619 Personal history of other infectious and parasitic diseases: Secondary | ICD-10-CM | POA: Insufficient documentation

## 2023-11-24 ENCOUNTER — Ambulatory Visit: Payer: Medicare Other

## 2023-12-11 DIAGNOSIS — R197 Diarrhea, unspecified: Secondary | ICD-10-CM | POA: Insufficient documentation

## 2023-12-25 ENCOUNTER — Other Ambulatory Visit: Payer: Self-pay

## 2023-12-25 ENCOUNTER — Emergency Department: Payer: Medicare Other

## 2023-12-25 ENCOUNTER — Emergency Department
Admission: EM | Admit: 2023-12-25 | Discharge: 2023-12-25 | Disposition: A | Discharge status: Home / Self Care | Payer: Medicare Other | Attending: Emergency Medicine | Admitting: Emergency Medicine

## 2023-12-25 DIAGNOSIS — R748 Abnormal levels of other serum enzymes: Secondary | ICD-10-CM | POA: Insufficient documentation

## 2023-12-25 DIAGNOSIS — R1013 Epigastric pain: Secondary | ICD-10-CM | POA: Diagnosis not present

## 2023-12-25 DIAGNOSIS — Z85528 Personal history of other malignant neoplasm of kidney: Secondary | ICD-10-CM | POA: Diagnosis not present

## 2023-12-25 DIAGNOSIS — R197 Diarrhea, unspecified: Secondary | ICD-10-CM | POA: Diagnosis present

## 2023-12-25 DIAGNOSIS — R1033 Periumbilical pain: Secondary | ICD-10-CM | POA: Insufficient documentation

## 2023-12-25 LAB — CBC
HCT: 38.3 % (ref 36.0–46.0)
Hemoglobin: 13 g/dL (ref 12.0–15.0)
MCH: 29.9 pg (ref 26.0–34.0)
MCHC: 33.9 g/dL (ref 30.0–36.0)
MCV: 88 fL (ref 80.0–100.0)
Platelets: 194 10*3/uL (ref 150–400)
RBC: 4.35 MIL/uL (ref 3.87–5.11)
RDW: 15 % (ref 11.5–15.5)
WBC: 7.1 10*3/uL (ref 4.0–10.5)
nRBC: 0 % (ref 0.0–0.2)

## 2023-12-25 LAB — URINALYSIS, ROUTINE W REFLEX MICROSCOPIC
Bilirubin Urine: NEGATIVE
Glucose, UA: NEGATIVE mg/dL
Hgb urine dipstick: NEGATIVE
Ketones, ur: NEGATIVE mg/dL
Leukocytes,Ua: NEGATIVE
Nitrite: NEGATIVE
Protein, ur: NEGATIVE mg/dL
Specific Gravity, Urine: 1.011 (ref 1.005–1.030)
pH: 5 (ref 5.0–8.0)

## 2023-12-25 LAB — COMPREHENSIVE METABOLIC PANEL
ALT: 27 U/L (ref 0–44)
AST: 28 U/L (ref 15–41)
Albumin: 3.5 g/dL (ref 3.5–5.0)
Alkaline Phosphatase: 65 U/L (ref 38–126)
Anion gap: 7 (ref 5–15)
BUN: 23 mg/dL (ref 8–23)
CO2: 25 mmol/L (ref 22–32)
Calcium: 9.2 mg/dL (ref 8.9–10.3)
Chloride: 106 mmol/L (ref 98–111)
Creatinine, Ser: 1.1 mg/dL — ABNORMAL HIGH (ref 0.44–1.00)
GFR, Estimated: 55 mL/min — ABNORMAL LOW (ref >60)
Glucose, Bld: 138 mg/dL — ABNORMAL HIGH (ref 70–99)
Potassium: 3.7 mmol/L (ref 3.5–5.1)
Sodium: 138 mmol/L (ref 135–145)
Total Bilirubin: 0.5 mg/dL (ref 0.0–1.2)
Total Protein: 6.9 g/dL (ref 6.5–8.1)

## 2023-12-25 LAB — LIPASE, BLOOD: Lipase: 280 U/L — ABNORMAL HIGH (ref 11–51)

## 2023-12-25 MED ORDER — IOHEXOL 300 MG/ML  SOLN
100.0000 mL | Freq: Once | INTRAMUSCULAR | Status: AC | PRN
  Administered 2023-12-25: 100 mL via INTRAVENOUS

## 2023-12-25 NOTE — Discharge Instructions (Signed)
 Please follow-up with your oncology team for ongoing management outpatient of your diarrhea.  CT imaging otherwise reassuring outside of your known spots on your kidney and pancreas.

## 2023-12-25 NOTE — ED Triage Notes (Signed)
 Pt sts that she has been having diarrhea since December when she started getting chemo for Kidney cancer at [facility]. Pt sts that she has taken OTC meds to help with the diarrhea but nothing is working.

## 2023-12-25 NOTE — ED Provider Notes (Signed)
 [facility] Provider Note    Event Date/Time   First MD Initiated Contact with Patient 12/25/23 1502     (approximate)   History   Diarrhea   HPI Alyssa HAVLICEK is a 68 y.o. female with history of renal cell carcinoma presenting today for diarrhea.  Patient states she has had diarrhea for the past 2 months since initiating chemotherapy to treat her renal cell carcinoma.  They have been managing this with Imodium.  She states in the past 24 hours it is severely worsened and has had 10 episodes of diarrhea today.  She notes abdominal cramping as well.  Otherwise denies nausea, vomiting, cough, congestion, chest pain, shortness of breath, dysuria, melena, hematochezia.  Has still been able to tolerate p.o. without issue and no lightheaded symptoms.     Physical Exam   Triage Vital Signs: ED Triage Vitals [12/25/23 1329]  Encounter Vitals Group     BP 130/80     Systolic BP Percentile      Diastolic BP Percentile      Pulse Rate 81     Resp 17     Temp 98.1 F (36.7 C)     Temp Source Oral     SpO2 97 %     Weight (!) 320 lb (145.2 kg)     Height 5' (1.524 m)     Head Circumference      Peak Flow      Pain Score 6     Pain Loc      Pain Education      Exclude from Growth Chart     Most recent vital signs: Vitals:   12/25/23 1329 12/25/23 1803  BP: 130/80 132/80  Pulse: 81 67  Resp: 17 16  Temp: 98.1 F (36.7 C) (!) 97.2 F (36.2 C)  SpO2: 97% 100%   Physical Exam: I have reviewed the vital signs and nursing notes. General: Awake, alert, no acute distress.  Nontoxic appearing. Head:  Atraumatic, normocephalic.   ENT:  EOM intact, PERRL. Oral mucosa is pink and moist with no lesions. Neck: Neck is supple with full range of motion, No meningeal signs. Cardiovascular:  RRR, No murmurs. Peripheral pulses palpable and equal bilaterally. Respiratory:  Symmetrical chest wall expansion.  No rhonchi, rales, or wheezes.  Good air movement  throughout.  No use of accessory muscles.   Musculoskeletal:  No cyanosis or edema. Moving extremities with full ROM Abdomen:  Soft, tenderness palpation in epigastric and periumbilical region, nondistended.  Prior surgical scars present throughout abdomen Neuro:  GCS 15, moving all four extremities, interacting appropriately. Speech clear. Psych:  Calm, appropriate.   Skin:  Warm, dry, no rash.    ED Results / Procedures / Treatments   Labs (all labs ordered are listed, but only abnormal results are displayed) Labs Reviewed  LIPASE, BLOOD - Abnormal; Notable for the following components:      Result Value   Lipase 280 (*)    All other components within normal limits  COMPREHENSIVE METABOLIC PANEL - Abnormal; Notable for the following components:   Glucose, Bld 138 (*)    Creatinine, Ser 1.10 (*)    GFR, Estimated 55 (*)    All other components within normal limits  URINALYSIS, ROUTINE W REFLEX MICROSCOPIC - Abnormal; Notable for the following components:   Color, Urine YELLOW (*)    APPearance CLEAR (*)    All other components within normal limits  GASTROINTESTINAL PANEL BY PCR, STOOL (  REPLACES STOOL CULTURE)  CBC     EKG    RADIOLOGY Independently interpreted CT imaging with no new renal and pancreatic masses.  Consistent with her known baseline.  No other acute infections   PROCEDURES:  Critical Care performed: No  Procedures   MEDICATIONS ORDERED IN ED: Medications  iohexol (OMNIPAQUE) 300 MG/ML solution 100 mL (100 mLs Intravenous Contrast Given 12/25/23 1722)     IMPRESSION / MDM / ASSESSMENT AND PLAN / ED COURSE  I reviewed the triage vital signs and the nursing notes.                              Differential diagnosis includes, but is not limited to, chemotherapy-induced diarrhea, viral gastroenteritis, chronic diarrheal state, colitis, enteritis  Patient's presentation is most consistent with acute complicated illness / injury requiring diagnostic  workup.  Patient is a 68 year old female presenting today for worsening diarrhea over the past 24 hours.  Vital signs are stable and physical exam is notable for tenderness palpation periumbilically and in the epigastric region.  She is otherwise tolerating p.o. and comfortable in the bed.  Laboratory workup with normal CBC and CMP comparable to her baseline.  Lipase slightly elevated likely from her diarrheal state.  UA negative.  Will get CT abdomen/pelvis given prior abdominal surgeries and tenderness in the abdomen along with current renal cell carcinoma.  Will also attempt to collect stool culture.  CT imaging shows her known renal and pancreatic masses.  She states none of this is new to her and already being treated.  She has not had any diarrhea throughout my evaluation in the ED and unable to capture a stool culture because of this.  I suspect it is more her chronic diarrhea at this time and safe for discharge and follow-up with her oncology team.  Patient agreeable with this plan and given strict return precautions.  Clinical Course as of 12/25/23 1946  Thu Dec 25, 2023  1944 Reassessed patient.  No ongoing diarrhea.  Safe for discharge with CT consistent with her baseline [DW]    Clinical Course User Index [DW] Janith Lima, MD     FINAL CLINICAL IMPRESSION(S) / ED DIAGNOSES   Final diagnoses:  Diarrhea, unspecified type     Rx / DC Orders   ED Discharge Orders     None        Note:  This document was prepared using Dragon voice recognition software and may include unintentional dictation errors.   Janith Lima, MD 12/25/23 [phone]

## 2023-12-25 NOTE — ED Notes (Signed)
 Pt to CT

## 2023-12-25 NOTE — ED Notes (Addendum)
 Pt states that they are being treated for kidney cancer and received their first infusion in mid December and they started having daily bouts of diarrhea. After their second treatment pt stated that they were having a minimum of 5 bouts of diarrhea each day. Pt states that they have belly pain in the upper portion of their abdomen that stays about a 6 but when it flairs up it's an 8.

## 2024-03-26 ENCOUNTER — Emergency Department

## 2024-03-26 ENCOUNTER — Other Ambulatory Visit: Payer: Self-pay

## 2024-03-26 ENCOUNTER — Inpatient Hospital Stay
Admission: EM | Admit: 2024-03-26 | Discharge: 2024-04-01 | DRG: 603 | Disposition: A | Discharge status: Home Health | Attending: Hospitalist | Admitting: Hospitalist

## 2024-03-26 DIAGNOSIS — E119 Type 2 diabetes mellitus without complications: Secondary | ICD-10-CM

## 2024-03-26 DIAGNOSIS — E1151 Type 2 diabetes mellitus with diabetic peripheral angiopathy without gangrene: Secondary | ICD-10-CM | POA: Diagnosis present

## 2024-03-26 DIAGNOSIS — J42 Unspecified chronic bronchitis: Secondary | ICD-10-CM

## 2024-03-26 DIAGNOSIS — M79672 Pain in left foot: Secondary | ICD-10-CM | POA: Diagnosis not present

## 2024-03-26 DIAGNOSIS — R002 Palpitations: Secondary | ICD-10-CM | POA: Diagnosis present

## 2024-03-26 DIAGNOSIS — Z6841 Body Mass Index (BMI) 40.0 and over, adult: Secondary | ICD-10-CM

## 2024-03-26 DIAGNOSIS — Z87891 Personal history of nicotine dependence: Secondary | ICD-10-CM

## 2024-03-26 DIAGNOSIS — I4892 Unspecified atrial flutter: Secondary | ICD-10-CM | POA: Insufficient documentation

## 2024-03-26 DIAGNOSIS — Z5986 Financial insecurity: Secondary | ICD-10-CM

## 2024-03-26 DIAGNOSIS — Z841 Family history of disorders of kidney and ureter: Secondary | ICD-10-CM

## 2024-03-26 DIAGNOSIS — Z7901 Long term (current) use of anticoagulants: Secondary | ICD-10-CM

## 2024-03-26 DIAGNOSIS — R262 Difficulty in walking, not elsewhere classified: Secondary | ICD-10-CM | POA: Diagnosis present

## 2024-03-26 DIAGNOSIS — J449 Chronic obstructive pulmonary disease, unspecified: Secondary | ICD-10-CM | POA: Insufficient documentation

## 2024-03-26 DIAGNOSIS — I480 Paroxysmal atrial fibrillation: Secondary | ICD-10-CM | POA: Diagnosis present

## 2024-03-26 DIAGNOSIS — Z85528 Personal history of other malignant neoplasm of kidney: Secondary | ICD-10-CM

## 2024-03-26 DIAGNOSIS — Z91040 Latex allergy status: Secondary | ICD-10-CM

## 2024-03-26 DIAGNOSIS — J45909 Unspecified asthma, uncomplicated: Secondary | ICD-10-CM | POA: Diagnosis present

## 2024-03-26 DIAGNOSIS — Z8249 Family history of ischemic heart disease and other diseases of the circulatory system: Secondary | ICD-10-CM

## 2024-03-26 DIAGNOSIS — C641 Malignant neoplasm of right kidney, except renal pelvis: Secondary | ICD-10-CM

## 2024-03-26 DIAGNOSIS — H8109 Meniere's disease, unspecified ear: Secondary | ICD-10-CM | POA: Diagnosis present

## 2024-03-26 DIAGNOSIS — Z79899 Other long term (current) drug therapy: Secondary | ICD-10-CM

## 2024-03-26 DIAGNOSIS — Z9221 Personal history of antineoplastic chemotherapy: Secondary | ICD-10-CM

## 2024-03-26 DIAGNOSIS — E876 Hypokalemia: Secondary | ICD-10-CM | POA: Diagnosis not present

## 2024-03-26 DIAGNOSIS — K219 Gastro-esophageal reflux disease without esophagitis: Secondary | ICD-10-CM | POA: Insufficient documentation

## 2024-03-26 DIAGNOSIS — L03116 Cellulitis of left lower limb: Secondary | ICD-10-CM | POA: Diagnosis not present

## 2024-03-26 DIAGNOSIS — E66813 Obesity, class 3: Secondary | ICD-10-CM

## 2024-03-26 DIAGNOSIS — I10 Essential (primary) hypertension: Secondary | ICD-10-CM | POA: Diagnosis not present

## 2024-03-26 DIAGNOSIS — M109 Gout, unspecified: Secondary | ICD-10-CM | POA: Diagnosis present

## 2024-03-26 DIAGNOSIS — G4733 Obstructive sleep apnea (adult) (pediatric): Secondary | ICD-10-CM | POA: Diagnosis present

## 2024-03-26 DIAGNOSIS — E049 Nontoxic goiter, unspecified: Secondary | ICD-10-CM

## 2024-03-26 DIAGNOSIS — Z886 Allergy status to analgesic agent status: Secondary | ICD-10-CM

## 2024-03-26 DIAGNOSIS — Z860100 Personal history of colon polyps, unspecified: Secondary | ICD-10-CM

## 2024-03-26 DIAGNOSIS — C649 Malignant neoplasm of unspecified kidney, except renal pelvis: Secondary | ICD-10-CM | POA: Diagnosis present

## 2024-03-26 DIAGNOSIS — J440 Chronic obstructive pulmonary disease with acute lower respiratory infection: Secondary | ICD-10-CM | POA: Diagnosis present

## 2024-03-26 DIAGNOSIS — E870 Hyperosmolality and hypernatremia: Secondary | ICD-10-CM | POA: Diagnosis not present

## 2024-03-26 DIAGNOSIS — Z794 Long term (current) use of insulin: Secondary | ICD-10-CM

## 2024-03-26 DIAGNOSIS — Z791 Long term (current) use of non-steroidal anti-inflammatories (NSAID): Secondary | ICD-10-CM

## 2024-03-26 DIAGNOSIS — J209 Acute bronchitis, unspecified: Secondary | ICD-10-CM | POA: Diagnosis present

## 2024-03-26 DIAGNOSIS — Z90710 Acquired absence of both cervix and uterus: Secondary | ICD-10-CM

## 2024-03-26 DIAGNOSIS — E042 Nontoxic multinodular goiter: Secondary | ICD-10-CM | POA: Diagnosis present

## 2024-03-26 HISTORY — DX: Malignant neoplasm of unspecified kidney, except renal pelvis: C64.9

## 2024-03-26 LAB — LACTIC ACID, PLASMA: Lactic Acid, Venous: 1.3 mmol/L (ref 0.5–1.9)

## 2024-03-26 LAB — MAGNESIUM: Magnesium: 1.5 mg/dL — ABNORMAL LOW (ref 1.7–2.4)

## 2024-03-26 LAB — SEDIMENTATION RATE: Sed Rate: 100 mm/h — ABNORMAL HIGH (ref 0–30)

## 2024-03-26 LAB — BASIC METABOLIC PANEL WITH GFR
Anion gap: 11 (ref 5–15)
BUN: 10 mg/dL (ref 8–23)
CO2: 24 mmol/L (ref 22–32)
Calcium: 9.2 mg/dL (ref 8.9–10.3)
Chloride: 104 mmol/L (ref 98–111)
Creatinine, Ser: 0.92 mg/dL (ref 0.44–1.00)
GFR, Estimated: >60 mL/min (ref >60)
Glucose, Bld: 161 mg/dL — ABNORMAL HIGH (ref 70–99)
Potassium: 3.2 mmol/L — ABNORMAL LOW (ref 3.5–5.1)
Sodium: 139 mmol/L (ref 135–145)

## 2024-03-26 LAB — CBC
HCT: 34.8 % — ABNORMAL LOW (ref 36.0–46.0)
Hemoglobin: 12 g/dL (ref 12.0–15.0)
MCH: 29.8 pg (ref 26.0–34.0)
MCHC: 34.5 g/dL (ref 30.0–36.0)
MCV: 86.4 fL (ref 80.0–100.0)
Platelets: 186 10*3/uL (ref 150–400)
RBC: 4.03 MIL/uL (ref 3.87–5.11)
RDW: 13.3 % (ref 11.5–15.5)
WBC: 7.4 10*3/uL (ref 4.0–10.5)
nRBC: 0 % (ref 0.0–0.2)

## 2024-03-26 LAB — URIC ACID: Uric Acid, Serum: 10.1 mg/dL — ABNORMAL HIGH (ref 2.5–7.1)

## 2024-03-26 LAB — PHOSPHORUS: Phosphorus: 2.5 mg/dL (ref 2.5–4.6)

## 2024-03-26 MED ORDER — HYDROCHLOROTHIAZIDE 12.5 MG PO TABS
12.5000 mg | ORAL_TABLET | Freq: Every day | ORAL | Status: DC
  Administered 2024-03-26 – 2024-03-27 (×2): 12.5 mg via ORAL
  Filled 2024-03-26 (×3): qty 1

## 2024-03-26 MED ORDER — MORPHINE SULFATE (PF) 2 MG/ML IV SOLN: 2.0000 mg | INTRAVENOUS | Status: DC | PRN

## 2024-03-26 MED ORDER — VANCOMYCIN HCL 1250 MG/250ML IV SOLN
1250.0000 mg | INTRAVENOUS | Status: DC
  Administered 2024-03-27 – 2024-03-29 (×3): 1250 mg via INTRAVENOUS
  Filled 2024-03-26 (×3): qty 250

## 2024-03-26 MED ORDER — LATANOPROST 0.005 % OP SOLN
1.0000 [drp] | Freq: Every day | OPHTHALMIC | Status: DC
  Administered 2024-03-26 – 2024-03-31 (×6): 1 [drp] via OPHTHALMIC
  Filled 2024-03-26: qty 2.5

## 2024-03-26 MED ORDER — FAMOTIDINE 20 MG PO TABS
40.0000 mg | ORAL_TABLET | Freq: Every day | ORAL | Status: DC
  Administered 2024-03-26 – 2024-03-31 (×6): 40 mg via ORAL
  Filled 2024-03-26 (×6): qty 2

## 2024-03-26 MED ORDER — VANCOMYCIN HCL IN DEXTROSE 1-5 GM/200ML-% IV SOLN
1000.0000 mg | Freq: Once | INTRAVENOUS | Status: AC
  Administered 2024-03-26: 1000 mg via INTRAVENOUS
  Filled 2024-03-26: qty 200

## 2024-03-26 MED ORDER — POTASSIUM CHLORIDE CRYS ER 20 MEQ PO TBCR
40.0000 meq | EXTENDED_RELEASE_TABLET | Freq: Once | ORAL | Status: AC
  Administered 2024-03-26: 40 meq via ORAL
  Filled 2024-03-26: qty 2

## 2024-03-26 MED ORDER — ALBUTEROL SULFATE (2.5 MG/3ML) 0.083% IN NEBU
2.5000 mg | INHALATION_SOLUTION | RESPIRATORY_TRACT | Status: DC | PRN
  Administered 2024-03-27 – 2024-03-30 (×3): 2.5 mg via RESPIRATORY_TRACT
  Filled 2024-03-26 (×3): qty 3

## 2024-03-26 MED ORDER — ACETAMINOPHEN 325 MG PO TABS: 650.0000 mg | ORAL_TABLET | Freq: Four times a day (QID) | ORAL | Status: DC | PRN

## 2024-03-26 MED ORDER — DM-GUAIFENESIN ER 30-600 MG PO TB12: 1.0000 | ORAL_TABLET | Freq: Two times a day (BID) | ORAL | Status: DC | PRN

## 2024-03-26 MED ORDER — SODIUM CHLORIDE 0.9 % IV BOLUS
1000.0000 mL | Freq: Once | INTRAVENOUS | Status: AC
  Administered 2024-03-26: 1000 mL via INTRAVENOUS

## 2024-03-26 MED ORDER — DIAZEPAM 2 MG PO TABS: 2.0000 mg | ORAL_TABLET | Freq: Two times a day (BID) | ORAL | Status: DC | PRN

## 2024-03-26 MED ORDER — LOSARTAN POTASSIUM 50 MG PO TABS
50.0000 mg | ORAL_TABLET | Freq: Every day | ORAL | Status: DC
  Administered 2024-03-26 – 2024-03-27 (×2): 50 mg via ORAL
  Filled 2024-03-26 (×3): qty 1

## 2024-03-26 MED ORDER — LOPERAMIDE HCL 1 MG/7.5ML PO SUSP: 1.0000 mg | Freq: Every day | ORAL | Status: DC | PRN

## 2024-03-26 MED ORDER — SODIUM CHLORIDE 0.9 % IV SOLN
2.0000 g | INTRAVENOUS | Status: DC
  Administered 2024-03-27 – 2024-03-28 (×2): 2 g via INTRAVENOUS
  Filled 2024-03-26 (×2): qty 20

## 2024-03-26 MED ORDER — BRIMONIDINE TARTRATE 0.2 % OP SOLN: 1.0000 [drp] | Freq: Three times a day (TID) | OPHTHALMIC | Status: DC

## 2024-03-26 MED ORDER — HYDRALAZINE HCL 20 MG/ML IJ SOLN: 5.0000 mg | INTRAMUSCULAR | Status: DC | PRN

## 2024-03-26 MED ORDER — FENTANYL CITRATE PF 50 MCG/ML IJ SOSY
50.0000 ug | PREFILLED_SYRINGE | Freq: Once | INTRAMUSCULAR | Status: AC
  Administered 2024-03-26: 50 ug via INTRAVENOUS
  Filled 2024-03-26: qty 1

## 2024-03-26 MED ORDER — MECLIZINE HCL 25 MG PO TABS: 25.0000 mg | ORAL_TABLET | Freq: Three times a day (TID) | ORAL | Status: DC | PRN

## 2024-03-26 MED ORDER — SODIUM CHLORIDE 0.9 % IV SOLN: INTRAVENOUS | Status: AC | PRN

## 2024-03-26 MED ORDER — DOCUSATE SODIUM 50 MG/5ML PO LIQD
50.0000 mg | Freq: Every day | ORAL | Status: DC
  Administered 2024-03-27 – 2024-04-01 (×4): 50 mg via ORAL
  Filled 2024-03-26 (×6): qty 10

## 2024-03-26 MED ORDER — SODIUM CHLORIDE 0.9 % IV SOLN
2.0000 g | Freq: Once | INTRAVENOUS | Status: AC
  Administered 2024-03-26: 2 g via INTRAVENOUS
  Filled 2024-03-26: qty 20

## 2024-03-26 MED ORDER — COLESTIPOL HCL 1 G PO TABS
3.0000 g | ORAL_TABLET | Freq: Two times a day (BID) | ORAL | Status: DC
  Administered 2024-03-26 – 2024-03-28 (×5): 3 g via ORAL
  Filled 2024-03-26 (×6): qty 3

## 2024-03-26 MED ORDER — HEPARIN SODIUM (PORCINE) 5000 UNIT/ML IJ SOLN
5000.0000 [IU] | Freq: Three times a day (TID) | INTRAMUSCULAR | Status: DC
  Administered 2024-03-26 – 2024-03-27 (×3): 5000 [IU] via SUBCUTANEOUS
  Filled 2024-03-26 (×3): qty 1

## 2024-03-26 MED ORDER — ONDANSETRON HCL 4 MG/2ML IJ SOLN: 4.0000 mg | Freq: Three times a day (TID) | INTRAMUSCULAR | Status: DC | PRN

## 2024-03-26 MED ORDER — OXYCODONE-ACETAMINOPHEN 5-325 MG PO TABS: 1.0000 | ORAL_TABLET | ORAL | Status: DC | PRN

## 2024-03-26 MED ORDER — METHYLPREDNISOLONE SODIUM SUCC 40 MG IJ SOLR
40.0000 mg | Freq: Every day | INTRAMUSCULAR | Status: DC
  Administered 2024-03-26 – 2024-03-28 (×3): 40 mg via INTRAVENOUS
  Filled 2024-03-26 (×4): qty 1

## 2024-03-26 MED ORDER — BRINZOLAMIDE 1 % OP SUSP: 1.0000 [drp] | Freq: Three times a day (TID) | OPHTHALMIC | Status: DC

## 2024-03-26 MED ORDER — DORZOLAMIDE HCL 2 % OP SOLN
1.0000 [drp] | Freq: Two times a day (BID) | OPHTHALMIC | Status: DC
  Administered 2024-03-26 – 2024-04-01 (×12): 1 [drp] via OPHTHALMIC
  Filled 2024-03-26: qty 10

## 2024-03-26 MED ORDER — ENSURE PLUS HIGH PROTEIN PO LIQD
237.0000 mL | Freq: Two times a day (BID) | ORAL | Status: DC
  Administered 2024-03-27 – 2024-04-01 (×10): 237 mL via ORAL

## 2024-03-26 MED ORDER — AMLODIPINE BESYLATE 5 MG PO TABS
5.0000 mg | ORAL_TABLET | Freq: Every day | ORAL | Status: DC
  Administered 2024-03-26 – 2024-03-27 (×2): 5 mg via ORAL
  Filled 2024-03-26 (×2): qty 1

## 2024-03-26 MED ORDER — LOSARTAN POTASSIUM-HCTZ 50-12.5 MG PO TABS: 1.0000 | ORAL_TABLET | Freq: Every day | ORAL | Status: DC

## 2024-03-26 MED ORDER — VANCOMYCIN HCL 1500 MG/300ML IV SOLN
1500.0000 mg | Freq: Once | INTRAVENOUS | Status: AC
  Administered 2024-03-26: 1500 mg via INTRAVENOUS
  Filled 2024-03-26: qty 300

## 2024-03-26 NOTE — H&P (Signed)
 History and Physical    Alyssa Leonard ZOX:[id number] DOB: 05-Apr-1956 DOA: 03/26/2024  Referring MD/NP/PA:   PCP: Alyssa Brazier, MD   Patient coming from:  The patient is coming from home.     Chief Complaint: Left foot pain  HPI: Alyssa Leonard is a 68 y.o. female with medical history significant of renal cell carcinoma on Ipilimumab (IPI) and Nivolumab (NIVO) immunotherapy in [facility], hypertension, diet-controlled diabetes, asthma, gout, anxiety, OSA on CPAP, morbid obesity, Mnire's disease, who presents with left foot pain.  Patient states that she has left foot pain and swelling in the past 4 days, no injury.  The left foot pain is constant, sharp, severe, nonradiating, not aggravated or alleviated by any known factors.  Right foot is also mildly erythematous and warm.  No fever or chills.  Patient does not have chest pain, cough, SOB.  No nausea, vomiting, diarrhea or abdominal pain.  No symptoms of UTI.  Data reviewed independently and ED Course: pt was found to have WBC 7.4, GFR> 60, potassium 3.2, temperature 99.6, blood pressure 147/79, heart rate 123, RR 22, oxygen saturation 100% on room air.  X-ray of left foot showed diffused soft tissue swelling.  Patient is placed in MedSurg bed for observation.   EKG:  Not done in ED, will get one.    Review of Systems:   General: no fevers, chills, no body weight gain, has fatigue HEENT: no blurry vision, hearing changes or sore throat Respiratory: no dyspnea, coughing, wheezing CV: no chest pain, no palpitations GI: no nausea, vomiting, abdominal pain, diarrhea, constipation GU: no dysuria, burning on urination, increased urinary frequency, hematuria  Ext: has left foot pain and swelling Neuro: no unilateral weakness, numbness, or tingling, no vision change or hearing loss Skin: no rash, no skin tear. MSK: No muscle spasm, no deformity, no limitation of range of movement in spin Heme: No easy bruising.  Travel history: No  recent long distant travel.   Allergy:  Allergies  Allergen Reactions   Latex Other (See Comments), Dermatitis, Hives and Swelling    unknown  Other reaction(s): Other (See Comments)  unknown  Other reaction(s): Other (See Comments) unknown    unknown   Aspirin Other (See Comments) and Nausea And Vomiting    Makes stomach burn  Other reaction(s): Other (See Comments)  Makes stomach burn  Makes stomach burn    Other reaction(s): Other (See Comments) Makes stomach burn    Past Medical History:  Diagnosis Date   Asthma    COPD (chronic obstructive pulmonary disease) (HCC)    Diabetes mellitus without complication (HCC)    GERD (gastroesophageal reflux disease)    History of colon polyps    Hypertension    Meniere disease    Renal cell carcinoma (HCC)    Sleep apnea    Went for sleep study test in January 2018 "I haven't heard back from test"    Past Surgical History:  Procedure Laterality Date   ABDOMINAL HYSTERECTOMY     BARIATRIC SURGERY     CHOLECYSTECTOMY     COLONOSCOPY WITH PROPOFOL  N/A 12/11/2016   Procedure: COLONOSCOPY WITH PROPOFOL ;  Surgeon: Cassie Click, MD;  Location: [facility] ENDOSCOPY;  Service: Endoscopy;  Laterality: N/A;   COLONOSCOPY WITH PROPOFOL  N/A 11/27/2021   Procedure: COLONOSCOPY WITH PROPOFOL ;  Surgeon: Shane Darling, MD;  Location: [facility] ENDOSCOPY;  Service: Endoscopy;  Laterality: N/A;    Social History:  reports that she quit smoking about 30 years ago.  Her smoking use included cigarettes. She has never used smokeless tobacco. She reports that she does not drink alcohol and does not use drugs.  Family History:  Family History  Problem Relation Age of Onset   Hypertension Mother    Kidney disease Sister    Kidney disease Brother    Heart disease Brother    Heart disease Brother    Breast cancer Neg Hx      Prior to Admission medications   Medication Sig Start Date End Date Taking? Authorizing Provider  albuterol   (PROVENTIL  HFA;VENTOLIN  HFA) 108 (90 Base) MCG/ACT inhaler Inhale 2 puffs into the lungs every 6 (six) hours as needed for wheezing or shortness of breath.    [provider]  amLODipine  (NORVASC ) 5 MG tablet Take 5 mg by mouth daily.    [provider]  Brinzolamide -Brimonidine  1-0.2 % SUSP Apply to eye 2 (two) times daily.    [provider]  celecoxib (CELEBREX) 200 MG capsule Take 200 mg by mouth 2 (two) times daily. Patient not taking: Reported on 08/15/2023    [provider]  Cholecalciferol (VITAMIN D) 2000 units CAPS Take by mouth. Patient not taking: Reported on 08/15/2023    [provider]  diazepam  (VALIUM ) 2 MG tablet Take 2 mg by mouth daily.    [provider]  docusate sodium  (COLACE) 50 MG capsule Take 50 mg by mouth daily.    [provider]  doxycycline (VIBRA-TABS) 100 MG tablet Take 100 mg by mouth 2 (two) times daily. Patient not taking: Reported on 08/15/2023    [provider]  Fluticasone Furoate 50 MCG/ACT AEPB Inhale into the lungs.    [provider]  Insulin Pen Needle 31G X 5 MM MISC by Does not apply route. Patient not taking: Reported on 11/27/2021    [provider]  latanoprost  (XALATAN ) 0.005 % ophthalmic solution 1 drop at bedtime.    [provider]  liraglutide (VICTOZA) 18 MG/3ML SOPN Inject 18 mg into the skin daily. Patient not taking: Reported on 08/15/2023    [provider]  losartan -hydrochlorothiazide  (HYZAAR) 50-12.5 MG tablet Take 1 tablet by mouth daily.    [provider]  meclizine  (ANTIVERT ) 25 MG tablet Take 25 mg by mouth 3 (three) times daily as needed for dizziness.    [provider]  meloxicam (MOBIC) 7.5 MG tablet Take 7.5 mg by mouth daily. Patient not taking: Reported on 08/15/2023    [provider]  metoprolol succinate (TOPROL-XL) 25 MG 24 hr tablet Take 25 mg by mouth daily.    [provider]  ondansetron  (ZOFRAN ) 4 MG tablet Take 4 mg by mouth every 8 (eight) hours as needed for nausea or vomiting.    [provider]  oxyCODONE -acetaminophen  (PERCOCET/ROXICET) 5-325 MG tablet Take 1 tablet by mouth every 4 (four) hours as needed for severe pain. Patient not taking: Reported on 08/15/2023 03/17/22   Sung, Jade J, MD  pantoprazole (PROTONIX) 40 MG tablet Take 40 mg by mouth daily.    [provider]  Polyethylene Glycol POWD by Does not apply route. Patient not taking: Reported on 08/15/2023    [provider]  triamcinolone acetonide 40 MG/ML SUSP 40 mg, mupirocin cream 2 % CREA 15 g Apply 1 application topically 3 (three) times daily. Patient not taking: Reported on 08/15/2023    [provider]    Physical Exam: Vitals:   03/26/24 1738 03/26/24 1740 03/26/24 1742 03/26/24 2005  BP:   Aaron Aas)  149/94 (!) 147/79  Pulse: (!) (P) 127 (!) 120  (!) 123  Resp: (P) 19 20  (!) 22  Temp: (P) 98.7 F (37.1 C) 98.7 F (37.1 C)  99.6 F (37.6 C)  TempSrc: (P) Oral Oral  Oral  SpO2: (P) 100% 100%  100%  Weight:    130.6 kg  Height:    5' (1.524 m)   General: Not in acute distress HEENT:       Eyes: PERRL, EOMI, no jaundice       ENT: No discharge from the ears and nose, no pharynx injection, no tonsillar enlargement.        Neck: No JVD, no bruit, no mass felt. Heme: No neck lymph node enlargement. Cardiac: S1/S2, RRR, No murmurs, No gallops or rubs. Respiratory: No rales, wheezing, rhonchi or rubs. GI: Soft, nondistended, nontender, no rebound pain, no organomegaly, BS present. GU: No hematuria Ext: 1+DP/PT pulse bilaterally.  Has swelling, tenderness, warmth, mild erythema in left foot Musculoskeletal: No joint deformities, No joint redness or warmth, no limitation of ROM in spin. Skin: No rashes.  Neuro: Alert, oriented X3, cranial nerves II-XII grossly intact, moves all extremities normally.  Psych: Patient is not psychotic, no suicidal or  hemocidal ideation.  Labs on Admission: I have personally reviewed following labs and imaging studies  CBC: Recent Labs  Lab 03/26/24 1744  WBC 7.4  HGB 12.0  HCT 34.8*  MCV 86.4  PLT 186   Basic Metabolic Panel: Recent Labs  Lab 03/26/24 1744  NA 139  K 3.2*  CL 104  CO2 24  GLUCOSE 161*  BUN 10  CREATININE 0.92  CALCIUM 9.2   GFR: Estimated Creatinine Clearance: 73.5 mL/min (by C-G formula based on SCr of 0.92 mg/dL). Liver Function Tests: No results for input(s): "AST", "ALT", "ALKPHOS", "BILITOT", "PROT", "ALBUMIN" in the last 168 hours. No results for input(s): "LIPASE", "AMYLASE" in the last 168 hours. No results for input(s): "AMMONIA" in the last 168 hours. Coagulation Profile: No results for input(s): "INR", "PROTIME" in the last 168 hours. Cardiac Enzymes: No results for input(s): "CKTOTAL", "CKMB", "CKMBINDEX", "TROPONINI" in the last 168 hours. BNP (last 3 results) No results for input(s): "PROBNP" in the last 8760 hours. HbA1C: No results for input(s): "HGBA1C" in the last 72 hours. CBG: No results for input(s): "GLUCAP" in the last 168 hours. Lipid Profile: No results for input(s): "CHOL", "HDL", "LDLCALC", "TRIG", "CHOLHDL", "LDLDIRECT" in the last 72 hours. Thyroid Function Tests: No results for input(s): "TSH", "T4TOTAL", "FREET4", "T3FREE", "THYROIDAB" in the last 72 hours. Anemia Panel: No results for input(s): "VITAMINB12", "FOLATE", "FERRITIN", "TIBC", "IRON", "RETICCTPCT" in the last 72 hours. Urine analysis:    Component Value Date/Time   COLORURINE YELLOW (A) 12/25/2023 1459   APPEARANCEUR CLEAR (A) 12/25/2023 1459   LABSPEC 1.011 12/25/2023 1459   PHURINE 5.0 12/25/2023 1459   GLUCOSEU NEGATIVE 12/25/2023 1459   HGBUR NEGATIVE 12/25/2023 1459   BILIRUBINUR NEGATIVE 12/25/2023 1459   KETONESUR NEGATIVE 12/25/2023 1459   PROTEINUR NEGATIVE 12/25/2023 1459   NITRITE NEGATIVE 12/25/2023 1459   LEUKOCYTESUR NEGATIVE 12/25/2023 1459    Sepsis Labs: @LABRCNTIP (procalcitonin:4,lacticidven:4) )No results found for this or any previous visit (from the past 240 hours).   Radiological Exams on Admission:   Assessment/Plan Principal Problem:   Left foot pain Active Problems:   Cellulitis of left foot   Hypertension   Hypokalemia   Asthma, chronic   Diabetes mellitus without complication (HCC)   Renal cell carcinoma (HCC)  OSA (obstructive sleep apnea)   Morbid obesity with BMI of 50.0-59.9, adult (HCC)   Assessment and Plan:   Left foot pain due to cellulitis of left foot: Patient has whole left foot pain, swelling, erythema and warmth, likely due to cellulitis.  Another potential differential diagnosis is gout which is difficult to be ruled out now.  Will check uric acid level.  Will treat patient for both empirically.  No fever or leukocytosis, clinically not septic.  - will place in med-surg bed for obs - Empiric antimicrobial treatment with vancomycin and Rocephin - start Solu-Medrol 40 mg daily - PRN Zofran  for nausea, and Tylenol , morphine and Percocet for pain - Blood cultures x 2  - ESR and CRP - check lactic acid levels - Check uric acid level - IVF: 1.0 L of NS bolus   Hypertension: - IV hydralazine as needed - Amlodipine, Hyzaar  Hypokalemia: Potassium 3.2 -Repleted potassium - Check phosphorus level - Check magnesium level  Asthma, chronic: Stable -Bronchodilators and as needed Mucinex  Diet controlled diabetes mellitus without complication (HCC): Recent A1c 5.8, well-controlled.  Patient's not taking medications currently.  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 Pt is receiving Ipi/Nivo immunotherapy in [facility]  No surgery was done. - Follow-up with oncologist in [facility] [place]  OSA (obstructive sleep apnea) -CPAP  Morbid  obesity with BMI of 50.0-59.9, adult (HCC):Patient is Obesity Class III, with body weight  130.6Kg and BMI  56.25kg/m2.  - Encourage losing weight - Exercise and healthy diet         DVT ppx: SQ Heparin    Code Status: Full code  Family Communication:     not done, no family member is at bed side.     Disposition Plan:  Anticipate discharge back to previous environment  Consults called:  none  Admission status and Level of care: Med-Surg:    for obs    Dispo: The patient is from: Home              Anticipated d/c is to: Home              Anticipated d/c date is: 1 day              Patient currently is not medically stable to d/c.    Severity of Illness:  The appropriate patient status for this patient is OBSERVATION. Observation status is judged to be reasonable and necessary in order to provide the required intensity of service to ensure the patient's safety. The patient's presenting symptoms, physical exam findings, and initial radiographic and laboratory data in the context of their medical condition is felt to place them at decreased risk for further clinical deterioration. Furthermore, it is anticipated that the patient will be medically stable for discharge from the hospital within 2 midnights of admission.        Date of Service 03/26/2024    Fidencio Hue [facility]   If 7PM-7AM, please contact night-coverage [email or web address] 03/26/2024, 9:18 PM

## 2024-03-26 NOTE — ED Provider Notes (Signed)
 [facility] Provider Note    Event Date/Time   First MD Initiated Contact with Patient 03/26/24 [phone]     (approximate)   History   Foot Pain   HPI  Alyssa Leonard is a 68 y.o. female with a history of renal cell carcinoma for which she receives chemotherapy, diabetes, COPD, hypertension who presents with left foot pain.  Patient reports she suspects it may be gout.  She reports her whole foot hurts and is hurt for several days.  She is unable to ambulate on it.  No fever reported.       Physical Exam   Triage Vital Signs: ED Triage Vitals  Encounter Vitals Group     BP 03/26/24 1742 (!) 149/94     Systolic BP Percentile --      Diastolic BP Percentile --      Pulse Rate 03/26/24 1738 (!) (P) 127     Resp 03/26/24 1738 (P) 19     Temp 03/26/24 1738 (P) 98.7 F (37.1 C)     Temp Source 03/26/24 1738 (P) Oral     SpO2 03/26/24 1738 (P) 100 %     Weight --      Height --      Head Circumference --      Peak Flow --      Pain Score 03/26/24 1740 10     Pain Loc --      Pain Education --      Exclude from Growth Chart --     Most recent vital signs: Vitals:   03/26/24 1742 03/26/24 2005  BP: (!) 149/94 (!) 147/79  Pulse:  (!) 123  Resp:  (!) 22  Temp:  99.6 F (37.6 C)  SpO2:  100%     General: Awake, no distress.  CV:  Good peripheral perfusion.  Resp:  Normal effort.  Abd:  No distention.  Other:  Left foot: Warm and well-perfused, mild erythema mild swelling, mild tenderness to palpation on the dorsal and plantar aspect   ED Results / Procedures / Treatments   Labs (all labs ordered are listed, but only abnormal results are displayed) Labs Reviewed  CBC - Abnormal; Notable for the following components:      Result Value   HCT 34.8 (*)    All other components within normal limits  BASIC METABOLIC PANEL WITH GFR - Abnormal; Notable for the following components:   Potassium 3.2 (*)    Glucose, Bld 161 (*)    All other  components within normal limits  CULTURE, BLOOD (ROUTINE X 2)  CULTURE, BLOOD (ROUTINE X 2)  LACTIC ACID, PLASMA  LACTIC ACID, PLASMA     EKG     RADIOLOGY Foot x-ray viewed interpret by me, no acute abnormality    PROCEDURES:  Critical Care performed:   Procedures   MEDICATIONS ORDERED IN ED: Medications  cefTRIAXone  (ROCEPHIN ) 2 g in sodium chloride  0.9 % 100 mL IVPB (has no administration in time range)  fentaNYL  (SUBLIMAZE ) injection 50 mcg (50 mcg Intravenous Given 03/26/24 2007)     IMPRESSION / MDM / ASSESSMENT AND PLAN / ED COURSE  I reviewed the triage vital signs and the nursing notes. Patient's presentation is most consistent with acute presentation with potential threat to life or bodily function.  Patient presents with severe left foot pain as detailed above, differential includes infection, gout, vascular abnormality.  Exam demonstrates that the foot is warm and well-perfused, good cap refill.  Lab work demonstrates normal white blood cell count.  No trauma, x-ray pending  Patient treated with IV fentanyl , remains tachycardic and continues to have pain.  At this point I am suspicious of cellulitis, because her temperature has elevated to 99.6, will add lactic and blood culture, treat with IV Rocephin  and consult the hospitalist team        FINAL CLINICAL IMPRESSION(S) / ED DIAGNOSES   Final diagnoses:  Cellulitis of left lower extremity     Rx / DC Orders   ED Discharge Orders     None        Note:  This document was prepared using Dragon voice recognition software and may include unintentional dictation errors.   Bryson Carbine, MD 03/26/24 2034

## 2024-03-26 NOTE — Consult Note (Signed)
 Pharmacy Antibiotic Note  ASSESSMENT: 68 y.o. female with PMH including morbid obesity, diabetes is presenting with left foot cellulitis. Foot XR shows diffuse soft tissue swelling with degenerative changes, but no evidence of osteomyelitis. Pharmacy has been consulted to manage vancomycin dosing.  Patient also receiving ceftriaxone.  PLAN: Administer vancomycin 2500mg  IV x 1 as a loading dose followed by vancomycin 1250mg  IV q24H thereafter eAUC 487, Cmax 31, Cmin 13 Scr 0.92, IBW, Vd 0.5 L/kg (BMI 56.25) Follow up culture results to assess for antibiotic optimization. Monitor renal function to assess for any necessary antibiotic dosing changes.  Patient measurements: Height: 5' (152.4 cm) Weight: 130.6 kg (288 lb) IBW/kg (Calculated) : 45.5  Vital signs: Temp: 99.6 F (37.6 C) (05/30 2005) Temp Source: Oral (05/30 2005) BP: 147/79 (05/30 2005) Pulse Rate: 123 (05/30 2005) Recent Labs  Lab 03/26/24 1744  WBC 7.4  CREATININE 0.92   Estimated Creatinine Clearance: 73.5 mL/min (by C-G formula based on SCr of 0.92 mg/dL).  Allergies: Allergies  Allergen Reactions   Latex Other (See Comments), Dermatitis, Hives and Swelling    unknown  Other reaction(s): Other (See Comments)  unknown  Other reaction(s): Other (See Comments) unknown    unknown   Aspirin Other (See Comments) and Nausea And Vomiting    Makes stomach burn  Other reaction(s): Other (See Comments)  Makes stomach burn  Makes stomach burn    Other reaction(s): Other (See Comments) Makes stomach burn    Antimicrobials this admission: Ceftriaxone 5/30 >> Vancomycin 5/30 >>  Dose adjustments this admission: N/A  Microbiology results: N/A  Thank you for allowing pharmacy to be a part of this patient's care.  Will M. Alva Jewels, PharmD Clinical Pharmacist 03/26/2024 9:18 PM

## 2024-03-26 NOTE — ED Triage Notes (Addendum)
 Pt to ED via POV from home. Pt reports left foot pain and swelling since Tuesday. Pt reports hx of gout and feels similar. No injury to foot.   Pt reports hx of kidney cancer and on infusion tx

## 2024-03-27 ENCOUNTER — Observation Stay

## 2024-03-27 DIAGNOSIS — I4892 Unspecified atrial flutter: Secondary | ICD-10-CM | POA: Diagnosis not present

## 2024-03-27 DIAGNOSIS — M79672 Pain in left foot: Secondary | ICD-10-CM | POA: Diagnosis not present

## 2024-03-27 LAB — D-DIMER, QUANTITATIVE: D-Dimer, Quant: 1.15 ug{FEU}/mL — ABNORMAL HIGH (ref 0.00–0.50)

## 2024-03-27 LAB — COMPREHENSIVE METABOLIC PANEL WITH GFR
ALT: 11 U/L (ref 0–44)
AST: 19 U/L (ref 15–41)
Albumin: 3.4 g/dL — ABNORMAL LOW (ref 3.5–5.0)
Alkaline Phosphatase: 67 U/L (ref 38–126)
Anion gap: 12 (ref 5–15)
BUN: 14 mg/dL (ref 8–23)
CO2: 20 mmol/L — ABNORMAL LOW (ref 22–32)
Calcium: 9.4 mg/dL (ref 8.9–10.3)
Chloride: 107 mmol/L (ref 98–111)
Creatinine, Ser: 1.01 mg/dL — ABNORMAL HIGH (ref 0.44–1.00)
GFR, Estimated: >60 mL/min (ref >60)
Glucose, Bld: 246 mg/dL — ABNORMAL HIGH (ref 70–99)
Potassium: 3.7 mmol/L (ref 3.5–5.1)
Sodium: 139 mmol/L (ref 135–145)
Total Bilirubin: 0.7 mg/dL (ref 0.0–1.2)
Total Protein: 7.2 g/dL (ref 6.5–8.1)

## 2024-03-27 LAB — CBC WITH DIFFERENTIAL/PLATELET
Abs Immature Granulocytes: 0.06 10*3/uL (ref 0.00–0.07)
Basophils Absolute: 0 10*3/uL (ref 0.0–0.1)
Basophils Relative: 0 %
Eosinophils Absolute: 0 10*3/uL (ref 0.0–0.5)
Eosinophils Relative: 0 %
HCT: 34.1 % — ABNORMAL LOW (ref 36.0–46.0)
Hemoglobin: 12 g/dL (ref 12.0–15.0)
Immature Granulocytes: 1 %
Lymphocytes Relative: 10 %
Lymphs Abs: 0.8 10*3/uL (ref 0.7–4.0)
MCH: 30.4 pg (ref 26.0–34.0)
MCHC: 35.2 g/dL (ref 30.0–36.0)
MCV: 86.3 fL (ref 80.0–100.0)
Monocytes Absolute: 0.7 10*3/uL (ref 0.1–1.0)
Monocytes Relative: 8 %
Neutro Abs: 6.8 10*3/uL (ref 1.7–7.7)
Neutrophils Relative %: 81 %
Platelets: 202 10*3/uL (ref 150–400)
RBC: 3.95 MIL/uL (ref 3.87–5.11)
RDW: 13.2 % (ref 11.5–15.5)
WBC: 8.3 10*3/uL (ref 4.0–10.5)
nRBC: 0 % (ref 0.0–0.2)

## 2024-03-27 LAB — CBC
HCT: 31.3 % — ABNORMAL LOW (ref 36.0–46.0)
Hemoglobin: 10.8 g/dL — ABNORMAL LOW (ref 12.0–15.0)
MCH: 29.5 pg (ref 26.0–34.0)
MCHC: 34.5 g/dL (ref 30.0–36.0)
MCV: 85.5 fL (ref 80.0–100.0)
Platelets: 167 10*3/uL (ref 150–400)
RBC: 3.66 MIL/uL — ABNORMAL LOW (ref 3.87–5.11)
RDW: 13.2 % (ref 11.5–15.5)
WBC: 5.4 10*3/uL (ref 4.0–10.5)
nRBC: 0 % (ref 0.0–0.2)

## 2024-03-27 LAB — BASIC METABOLIC PANEL WITH GFR
Anion gap: 10 (ref 5–15)
BUN: 9 mg/dL (ref 8–23)
CO2: 21 mmol/L — ABNORMAL LOW (ref 22–32)
Calcium: 8.7 mg/dL — ABNORMAL LOW (ref 8.9–10.3)
Chloride: 106 mmol/L (ref 98–111)
Creatinine, Ser: 0.9 mg/dL (ref 0.44–1.00)
GFR, Estimated: >60 mL/min (ref >60)
Glucose, Bld: 197 mg/dL — ABNORMAL HIGH (ref 70–99)
Potassium: 3.4 mmol/L — ABNORMAL LOW (ref 3.5–5.1)
Sodium: 137 mmol/L (ref 135–145)

## 2024-03-27 LAB — MAGNESIUM: Magnesium: 2.1 mg/dL (ref 1.7–2.4)

## 2024-03-27 LAB — TROPONIN I (HIGH SENSITIVITY)
Troponin I (High Sensitivity): 10 ng/L (ref <18)
Troponin I (High Sensitivity): 11 ng/L (ref <18)

## 2024-03-27 LAB — LACTIC ACID, PLASMA: Lactic Acid, Venous: 1.5 mmol/L (ref 0.5–1.9)

## 2024-03-27 LAB — GLUCOSE, CAPILLARY: Glucose-Capillary: 224 mg/dL — ABNORMAL HIGH (ref 70–99)

## 2024-03-27 LAB — C-REACTIVE PROTEIN: CRP: 4.6 mg/dL — ABNORMAL HIGH (ref <1.0)

## 2024-03-27 LAB — PROTIME-INR
INR: 1.2 (ref 0.8–1.2)
Prothrombin Time: 15.4 s — ABNORMAL HIGH (ref 11.4–15.2)

## 2024-03-27 LAB — HIV ANTIBODY (ROUTINE TESTING W REFLEX): HIV Screen 4th Generation wRfx: NONREACTIVE

## 2024-03-27 LAB — APTT: aPTT: 33 s (ref 24–36)

## 2024-03-27 MED ORDER — AMIODARONE HCL IN DEXTROSE 360-4.14 MG/200ML-% IV SOLN
30.0000 mg/h | INTRAVENOUS | Status: DC
  Administered 2024-03-28 (×2): 30 mg/h via INTRAVENOUS
  Filled 2024-03-27 (×4): qty 200

## 2024-03-27 MED ORDER — COLCHICINE 0.6 MG PO TABS
1.2000 mg | ORAL_TABLET | Freq: Once | ORAL | Status: AC
  Administered 2024-03-27: 1.2 mg via ORAL
  Filled 2024-03-27: qty 2

## 2024-03-27 MED ORDER — AMIODARONE LOAD VIA INFUSION
150.0000 mg | Freq: Once | INTRAVENOUS | Status: AC
  Administered 2024-03-27: 150 mg via INTRAVENOUS
  Filled 2024-03-27: qty 83.34

## 2024-03-27 MED ORDER — COLCHICINE 0.6 MG PO TABS
0.6000 mg | ORAL_TABLET | Freq: Two times a day (BID) | ORAL | Status: DC
  Administered 2024-03-28 – 2024-04-01 (×9): 0.6 mg via ORAL
  Filled 2024-03-27 (×9): qty 1

## 2024-03-27 MED ORDER — IOHEXOL 350 MG/ML SOLN
100.0000 mL | Freq: Once | INTRAVENOUS | Status: AC | PRN
  Administered 2024-03-28: 100 mL via INTRAVENOUS

## 2024-03-27 MED ORDER — POTASSIUM CHLORIDE CRYS ER 20 MEQ PO TBCR
40.0000 meq | EXTENDED_RELEASE_TABLET | Freq: Once | ORAL | Status: AC
  Administered 2024-03-27: 40 meq via ORAL
  Filled 2024-03-27: qty 2

## 2024-03-27 MED ORDER — AMIODARONE HCL IN DEXTROSE 360-4.14 MG/200ML-% IV SOLN
60.0000 mg/h | INTRAVENOUS | Status: DC
  Administered 2024-03-27 – 2024-03-28 (×2): 60 mg/h via INTRAVENOUS
  Filled 2024-03-27: qty 200

## 2024-03-27 MED ORDER — MAGNESIUM SULFATE 2 GM/50ML IV SOLN
2.0000 g | Freq: Once | INTRAVENOUS | Status: AC
  Administered 2024-03-27: 2 g via INTRAVENOUS
  Filled 2024-03-27: qty 50

## 2024-03-27 MED ORDER — HEPARIN BOLUS VIA INFUSION
4700.0000 [IU] | Freq: Once | INTRAVENOUS | Status: AC
  Administered 2024-03-27: 4700 [IU] via INTRAVENOUS
  Filled 2024-03-27: qty 4700

## 2024-03-27 MED ORDER — HEPARIN (PORCINE) 25000 UT/250ML-% IV SOLN
1000.0000 [IU]/h | INTRAVENOUS | Status: AC
  Administered 2024-03-27: 1100 [IU]/h via INTRAVENOUS
  Filled 2024-03-27 (×2): qty 250

## 2024-03-27 MED ORDER — DILTIAZEM HCL 25 MG/5ML IV SOLN
10.0000 mg | Freq: Once | INTRAVENOUS | Status: AC
  Administered 2024-03-27: 10 mg via INTRAVENOUS
  Filled 2024-03-27: qty 5

## 2024-03-27 NOTE — Consult Note (Signed)
 Pharmacy Consult Note - Anticoagulation  Pharmacy Consult for heparin  Indication: atrial fibrillation  PATIENT MEASUREMENTS: Height: 5' (152.4 cm) Weight: 129.4 kg (285 lb 4.4 oz) IBW/kg (Calculated) : 45.5 HEPARIN  DW (KG): 79  VITAL SIGNS: Temp: 98 F (36.7 C) (05/31 2040) Temp Source: Oral (05/31 2040) BP: 117/97 (05/31 2040) Pulse Rate: 132 (05/31 2040)  Recent Labs    03/27/24 0430 03/27/24 1626 03/27/24 1826  HGB 10.8*  --   --   HCT 31.3*  --   --   PLT 167  --   --   CREATININE 0.90  --   --   TROPONINIHS  --    < > 11   < > = values in this interval not displayed.    Estimated Creatinine Clearance: 74.7 mL/min (by C-G formula based on SCr of 0.9 mg/dL).  PAST MEDICAL HISTORY: Past Medical History:  Diagnosis Date   Asthma    COPD (chronic obstructive pulmonary disease) (HCC)    Diabetes mellitus without complication (HCC)    GERD (gastroesophageal reflux disease)    History of colon polyps    Hypertension    Meniere disease    Renal cell carcinoma (HCC)    Sleep apnea    Went for sleep study test in January 2018 "I haven't heard back from test"    ASSESSMENT: 68 y.o. female with PMH including renal cell carcinoma, HTN, DM, obesity is presenting with left foot pain. Patient found to be in Afib during admission. CHA2DS2VASc is at least 4 (HTN, age +23, DM, female sex). Patient is not on chronic anticoagulation per chart review. Pharmacy has been consulted to initiate and manage heparin  intravenous infusion.  Pertinent medications: Medications Prior to Admission  Medication Sig Dispense Refill Last Dose/Taking   acetaminophen  (TYLENOL ) 500 MG tablet Take 1,000 mg by mouth every 6 (six) hours as needed for mild pain (pain score 1-3).   Taking As Needed   albuterol  (PROVENTIL  HFA;VENTOLIN  HFA) 108 (90 Base) MCG/ACT inhaler Inhale 2 puffs into the lungs every 6 (six) hours as needed for wheezing or shortness of breath.   Taking As Needed   amLODipine  (NORVASC )  5 MG tablet Take 5 mg by mouth daily.   03/25/2024   Brinzolamide -Brimonidine  1-0.2 % SUSP Apply to eye 2 (two) times daily.   03/25/2024   colestipol  (COLESTID ) 1 g tablet Take 3 g by mouth 2 (two) times daily.   03/25/2024   diazepam  (VALIUM ) 2 MG tablet Take 2 mg by mouth daily.   Taking   dibucaine (NUPERCAINAL) 1 % OINT Place 1 Application rectally as needed for hemorrhoids.   Taking As Needed   docusate sodium  (COLACE) 50 MG capsule Take 50 mg by mouth daily.   03/25/2024   dorzolamide  (TRUSOPT ) 2 % ophthalmic solution Place 1 drop into both eyes 2 (two) times daily.   03/25/2024   famotidine  (PEPCID ) 40 MG tablet Take 40 mg by mouth at bedtime.   03/25/2024   latanoprost  (XALATAN ) 0.005 % ophthalmic solution 1 drop at bedtime.   03/25/2024   loperamide  (IMODIUM ) 1 MG/5ML solution Take 1 mg by mouth daily as needed for diarrhea or loose stools.   Taking As Needed   losartan -hydrochlorothiazide  (HYZAAR) 50-12.5 MG tablet Take 1 tablet by mouth daily.   03/25/2024   meclizine  (ANTIVERT ) 25 MG tablet Take 25 mg by mouth 3 (three) times daily as needed for dizziness.   03/25/2024   ondansetron  (ZOFRAN ) 4 MG tablet Take 4 mg by mouth every  8 (eight) hours as needed for nausea or vomiting.   03/25/2024   potassium chloride  (KLOR-CON  M) 10 MEQ tablet Take 10 mEq by mouth daily.   03/25/2024   Testosterone 12.5 MG/ACT (1%) GEL Apply 12.5 mg topically daily.   03/25/2024   celecoxib (CELEBREX) 200 MG capsule Take 200 mg by mouth 2 (two) times daily. (Patient not taking: Reported on 08/15/2023)   Not Taking   Cholecalciferol (VITAMIN D) 2000 units CAPS Take by mouth. (Patient not taking: Reported on 08/15/2023)   Not Taking   doxycycline (VIBRA-TABS) 100 MG tablet Take 100 mg by mouth 2 (two) times daily. (Patient not taking: Reported on 08/15/2023)   Not Taking   Fluticasone Furoate 50 MCG/ACT AEPB Inhale into the lungs. (Patient not taking: Reported on 03/26/2024)   Not Taking   Insulin Pen Needle 31G X 5 MM  MISC by Does not apply route. (Patient not taking: Reported on 11/27/2021)      liraglutide (VICTOZA) 18 MG/3ML SOPN Inject 18 mg into the skin daily. (Patient not taking: Reported on 08/15/2023)   Not Taking   meloxicam (MOBIC) 7.5 MG tablet Take 7.5 mg by mouth daily. (Patient not taking: Reported on 08/15/2023)   Not Taking   metoprolol succinate (TOPROL-XL) 25 MG 24 hr tablet Take 25 mg by mouth daily. (Patient not taking: Reported on 03/26/2024)   Not Taking   oxyCODONE -acetaminophen  (PERCOCET/ROXICET) 5-325 MG tablet Take 1 tablet by mouth every 4 (four) hours as needed for severe pain. (Patient not taking: Reported on 08/15/2023) 15 tablet 0 Not Taking   pantoprazole (PROTONIX) 40 MG tablet Take 40 mg by mouth daily. (Patient not taking: Reported on 03/26/2024)   Not Taking   Polyethylene Glycol POWD by Does not apply route. (Patient not taking: Reported on 08/15/2023)   Not Taking   triamcinolone acetonide 40 MG/ML SUSP 40 mg, mupirocin cream 2 % CREA 15 g Apply 1 application topically 3 (three) times daily. (Patient not taking: Reported on 08/15/2023)   Not Taking    Goal(s) of therapy: Heparin  level 0.3 - 0.7 units/mL Monitor platelets by anticoagulation protocol: Yes   Baseline anticoagulation labs: Recent Labs    03/26/24 1744 03/27/24 0430  HGB 12.0 10.8*  PLT 186 167   Additional baseline aPTT and INR have been ordered.   PLAN: Give 4700 units bolus x1; then start heparin  infusion at 1100 units/hour. Check heparin  level in 6 hours, then daily once at least two levels are consecutively therapeutic. Monitor CBC daily while on heparin  infusion.   Will M. Alva Jewels, PharmD Clinical Pharmacist 03/27/2024 9:35 PM

## 2024-03-27 NOTE — Progress Notes (Signed)
 Progress Note   Patient: Alyssa Leonard EXB:[id number] DOB: 04-21-1956 DOA: 03/26/2024     0 DOS: the patient was seen and examined on 03/27/2024   Brief hospital course:  "Alyssa Leonard is a 68 y.o. female with medical history significant of renal cell carcinoma on Ipilimumab (IPI) and Nivolumab (NIVO) immunotherapy in [facility], hypertension, diet-controlled diabetes, asthma, gout, anxiety, OSA on CPAP, morbid obesity, Mnire's disease, who presents with left foot pain. ..." See H&P for full HPI on admission & ED course.  Patient was admitted to the hospital, started on IV antibiotics for presumed left foot cellulitis and IV steroids for potential inflammatory process such as gout or pseudogout.  Further hospital course and management as outlined below.   Assessment and Plan:  Left foot pain  Cellulitis of left foot versus Acute Gout Flare:  Patient p/w whole left foot pain, swelling, erythema and warmth, likely due to cellulitis.  Another potential differential diagnosis is gout which is difficult to rule out.   Uric acid level elevated at 10.1. Treating empirically for both cellulitis and gout. --Continue empiric IV vancomycin  and Rocephin  --Continue Solu-Medrol  40 mg daily --Add colchicine - 1.2 mg today, start 0.6 mg BID tomorrow --PRN Zofran  for nausea, and Tylenol , morphine  and Percocet for pain --Follow blood cultures  --Trend inflammatory markers --PT evaluation  Ambulatory dysfunction - due to left foot pain from above --PT evaluation --Pain control PRN   Hypertension: --IV hydralazine  as needed --Amlodipine , Hyzaar   Hypokalemia: Potassium 3.2 on admission was replaced. --Monitor BMP, replace K as needed  Hypomagnesemia - Mg 1.5 --2 g IV Mg-sulfate to replace --Monitor Mg & replace as needed   Asthma, chronic: Stable --Bronchodilators and as needed Mucinex    Diet controlled diabetes mellitus without complication (HCC): Recent A1c 5.8, well-controlled.  Patient's  not taking medications currently.  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 Pt is receiving Ipi/Nivo immunotherapy in [facility].  No surgery was done. - Follow-up with oncologist in [facility]   OSA (obstructive sleep apnea) -CPAP   Morbid obesity with BMI of 50.0-59.9, adult (HCC):Patient is Obesity Class III, with body weight  130.6Kg and BMI  56.25kg/m2.  - Encourage losing weight - Exercise and healthy diet      Subjective: Pt awake resting in bed when seen this AM.  She reports feeling slightly better since admisison but has ongoing foot pain and inability to ambulate due to the pain.  No fever/chills.  No other acute complaints.    Physical Exam: Vitals:   03/26/24 2123 03/26/24 2226 03/27/24 0339 03/27/24 0736  BP: (!) 148/79 (!) 144/80 117/71 115/75  Pulse: (!) 110 (!) 107 90 96  Resp: (!) 21 19 18 18   Temp: 99.7 F (37.6 C) 99 F (37.2 C) 98.3 F (36.8 C) 98.3 F (36.8 C)  TempSrc: Oral Oral  Oral  SpO2: 100% 100% 98% 99%  Weight:      Height:       General exam: awake, alert, no acute distress, obese HEENT: atraumatic, clear conjunctiva, anicteric sclera, moist mucus membranes, hearing grossly normal  Respiratory system: CTAB, no wheezes, rales or rhonchi, normal respiratory effort. Cardiovascular system: normal S1/S2, RRR  Gastrointestinal system: soft, NT, ND Central nervous system: A&O x 3. no gross focal neurologic deficits, normal speech Extremities: left foot swelling with tenderness and erythema and warmth most significant at the lateral dorsal  aspect of the left foot Skin: dry, intact, normal temperature Psychiatry: normal mood, congruent affect, judgement and insight appear normal   Data Reviewed:  Notable labs --   K 3.4 Mg 1.5 Bicarb 21 Glucose 197 Ca 8.7 Hbg 10.8   Family Communication: None present. Pt updated  in detail.  Disposition: Status is: Observation Remains admitted on IV steroids and IV antibiotics pending further clinical improvement. Pain is uncontrolled and pt unable to ambulate.    Planned Discharge Destination: Home with Home Health    Time spent: 42 minutes  Author: Montey Apa, DO 03/27/2024 11:36 AM  For on call review [email or web address].

## 2024-03-27 NOTE — Evaluation (Signed)
 Physical Therapy Evaluation Patient Details Name: Alyssa Leonard MRN: [id number] DOB: 10/06/1956 Today's Date: 03/27/2024  History of Present Illness  Pt is a 68 y.o. female with medical history significant of renal cell carcinoma on immunotherapy at [facility], HTN, diet-controlled diabetes, asthma, gout, anxiety, OSA on CPAP, morbid obesity, Mnire's disease, who presents with left foot pain. MD assessment includes: cellulitis of left foot versus acute gout flare, ambulatory dysfunction, hypomagnesemia, and HTN.   Clinical Impression  Pt was pleasant and motivated to participate during the session and put forth good effort throughout. Pt required min A with bed mobility tasks and min A to come to standing but was steady upon initial stand.  Pt training provided visually and then with practice for step-to sequencing with a BRW to facilitate decreased weight bearing through the L foot during SLS phase.  Pt reported 5/10 pain through her L foot during session but stated her pain is significantly less than yesterday.  Pt was fatigued after amb 12 feet where she was leaning heavily on her UEs through the walker while advancing her RLE and needed to return to sitting.  Pt is expected to make good progress while in acute care and Pt will benefit from continued PT services upon discharge to safely address deficits listed in patient problem list for decreased caregiver assistance and eventual return to PLOF.          If plan is discharge home, recommend the following: A little help with walking and/or transfers;A little help with bathing/dressing/bathroom;Assistance with cooking/housework;Assist for transportation   Can travel by private vehicle        Equipment Recommendations BSC/3in1;Other (comment) (Pt has a RW but may benefit from a BRW, TBD)  Recommendations for Other Services       Functional Status Assessment Patient has had a recent decline in their functional status and demonstrates the ability  to make significant improvements in function in a reasonable and predictable amount of time.     Precautions / Restrictions Precautions Precautions: Fall Restrictions Weight Bearing Restrictions Per Provider Order: No      Mobility  Bed Mobility Overal bed mobility: Needs Assistance Bed Mobility: Supine to Sit, Sit to Supine     Supine to sit: Min assist Sit to supine: Supervision   General bed mobility comments: Min A for trunk control during sup to sit but no assist needed during sit to sup    Transfers Overall transfer level: Needs assistance Equipment used: Rolling walker (2 wheels) Transfers: Sit to/from Stand Sit to Stand: Min assist           General transfer comment: Min A to come to full standing position but steady upon initial stand    Ambulation/Gait Ambulation/Gait assistance: Contact guard assist Gait Distance (Feet): 12 Feet Assistive device: Rolling walker (2 wheels) Gait Pattern/deviations: Step-to pattern, Decreased step length - right, Decreased stance time - left, Antalgic Gait velocity: decreased     General Gait Details: Step-to sequencing education provided for L foot pain control with weight bearing with multi-modal cues for proper sequencing  Stairs            Wheelchair Mobility     Tilt Bed    Modified Rankin (Stroke Patients Only)       Balance Overall balance assessment: Needs assistance   Sitting balance-Leahy Scale: Normal     Standing balance support: Bilateral upper extremity supported, During functional activity Standing balance-Leahy Scale: Good  Pertinent Vitals/Pain Pain Assessment Pain Assessment: 0-10 Pain Score: 5  Pain Location: L foot with weight bearing only, 0/10 at rest Pain Descriptors / Indicators: Sore Pain Intervention(s): Premedicated before session, Monitored during session, Repositioned    Home Living Family/patient expects to be discharged  to:: Private residence Living Arrangements: Children;Non-relatives/Friends Available Help at Discharge: Family;Friend(s);Available 24 hours/day Type of Home: House Home Access: Level entry       Home Layout: One level Home Equipment: Agricultural consultant (2 wheels);Cane - single point      Prior Function Prior Level of Function : Independent/Modified Independent             Mobility Comments: Ind amb without an AD limited community distances, max of 2-3 isles of a grocery store, uses store's motorized cart if it will be more than that, no fall history ADLs Comments: Ind with ADLs     Extremity/Trunk Assessment   Upper Extremity Assessment Upper Extremity Assessment: Overall WFL for tasks assessed    Lower Extremity Assessment Lower Extremity Assessment: Generalized weakness       Communication   Communication Communication: No apparent difficulties    Cognition Arousal: Alert Behavior During Therapy: WFL for tasks assessed/performed   PT - Cognitive impairments: No apparent impairments                         Following commands: Intact       Cueing Cueing Techniques: Verbal cues, Visual cues     General Comments      Exercises     Assessment/Plan    PT Assessment Patient needs continued PT services  PT Problem List Decreased strength;Decreased activity tolerance;Decreased mobility;Decreased balance;Decreased knowledge of use of DME;Pain       PT Treatment Interventions DME instruction;Gait training;Functional mobility training;Therapeutic activities;Therapeutic exercise;Balance training;Patient/family education    PT Goals (Current goals can be found in the Care Plan section)  Acute Rehab PT Goals Patient Stated Goal: To walk without pain PT Goal Formulation: With patient Time For Goal Achievement: 04/09/24 Potential to Achieve Goals: Good    Frequency Min 2X/week     Co-evaluation               AM-PAC PT "6 Clicks" Mobility   Outcome Measure Help needed turning from your back to your side while in a flat bed without using bedrails?: A Little Help needed moving from lying on your back to sitting on the side of a flat bed without using bedrails?: A Little Help needed moving to and from a bed to a chair (including a wheelchair)?: A Little Help needed standing up from a chair using your arms (e.g., wheelchair or bedside chair)?: A Little Help needed to walk in hospital room?: A Little Help needed climbing 3-5 steps with a railing? : A Lot 6 Click Score: 17    End of Session Equipment Utilized During Treatment: Gait belt Activity Tolerance: Patient limited by fatigue;Patient limited by pain Patient left: in bed;with call bell/phone within reach;with bed alarm set Nurse Communication: Mobility status PT Visit Diagnosis: Pain Pain - Right/Left: Left Pain - part of body: Ankle and joints of foot    Time: 1610-9604 PT Time Calculation (min) (ACUTE ONLY): 33 min   Charges:   PT Evaluation $PT Eval Moderate Complexity: 1 Mod PT Treatments $Gait Training: 8-22 mins PT General Charges $$ ACUTE PT VISIT: 1 Visit       D. Madalyn Scarce PT, DPT 03/27/24, 3:49 PM

## 2024-03-27 NOTE — Progress Notes (Addendum)
 Patients HR elevated to 130-132 b/m,complains of chest heaviness. Informed Dr Kermitt Pedlar to Give one Dose of IV Diltiazem, Stat EKG-done,  Stat Troponin done- normal. Tele monitoring started. Patients HR still elevated, to be transferred to 2A and to start on Amiodarone drip.

## 2024-03-27 NOTE — Care Management Obs Status (Signed)
 MEDICARE OBSERVATION STATUS NOTIFICATION   Patient Details  Name: Alyssa Leonard MRN: [id number] Date of Birth: 1956-10-19   Medicare Observation Status Notification Given:  Yes    [place] L Audra Bellard, LCSW 03/27/2024, 2:28 PM

## 2024-03-27 NOTE — Significant Event (Incomplete)
       CROSS COVER NOTE  NAME: Alyssa Leonard MRN: [id number] DOB : [birth year] ATTENDING PHYSICIAN: Montey Apa, DO    Date of Service   03/27/2024   HPI/Events of Note   New onset a flutter Amio drip ordered by attending History   Interventions   Assessment/Plan:    03/27/2024    8:40 PM 03/27/2024    8:05 PM 03/27/2024    6:18 PM  Vitals with BMI  Weight 285 lbs 4 oz    BMI 55.71    Systolic 117 122   Diastolic 97 75   Pulse 132 130 121   Patient endorses chest pressure and feels the flutter BLE edema right calf bigger by eye than left Left  foot edema dorsal and lateral aspect, left foot painful to touch and darkened discoloration, left calf painful to palpation but no pain with dorsiflexion Shortness of breath with activity at baseline, denies changes in that currently sats stable on room air Moderate DVT risk per Wells Score High risk (7) PE risk per Wells score  Heparin  drip in addition to previously ordered amio Ddimer- elevated 1.15  CBC CMP-mile increase in creatinine; hyperglycemia 246, COS 20; Mag - 2.1;  PTT-33 BLE us  DVT - negative for DVT CTA chest PE  IMPRESSION: 1. No evidence of arterial occlusion or embolus to the segmental level. The subsegmental arterial bed is unopacified and not evaluated. 2. Cardiomegaly with IVC and hepatic vein reflux which could be due to right heart dysfunction or tricuspid regurgitation. 3. Stable small pericardial effusion. 4. Aortic and coronary artery atherosclerosis. 5. Stable 4 mm right upper lobe ground-glass nodule and 4 mm medial lingular solid nodule. 6. Diffuse bronchial thickening. Central airways are smaller than previously which could be due to bronchospasm or respiration. 7. Large right renal mass consistent with the known renal cell carcinoma. 8. Masslike fullness approaching 4 cm in the pancreatic uncinate process, partially seen. Findings are unchanged to the extent currently imaged compared with  12/25/2023. 9. Heterogeneous thyroid with bilateral nodules, largest on left is 3.6 cm. This has not been further assessed in our system. The left thyroid mass displaces the trachea right of midline. Nonemergent ultrasound follow-up is recommended unless it was recently performed elsewhere as previously recommended. 10. Hepatic steatosis.  Aortic Atherosclerosis (ICD10-I70.0).  5. ECHO complete with bubble study secondary to CT findings above suggestive of right heart failure, tricuspid regurg and pulmonary artery hypertension  6. US  thyroid secondary to above findings indicating large nodules largest on left causing tracheal deviation         Kip Peon NP [facility] Cross Cover 7pm-7am - check amion for availability Pager [phone]

## 2024-03-27 NOTE — Plan of Care (Signed)

## 2024-03-28 ENCOUNTER — Observation Stay (HOSPITAL_COMMUNITY)
Admit: 2024-03-28 | Discharge: 2024-03-28 | Disposition: A | Discharge status: Home / Self Care | Attending: Acute Care | Admitting: Acute Care

## 2024-03-28 ENCOUNTER — Observation Stay

## 2024-03-28 ENCOUNTER — Inpatient Hospital Stay

## 2024-03-28 DIAGNOSIS — Z91040 Latex allergy status: Secondary | ICD-10-CM | POA: Diagnosis not present

## 2024-03-28 DIAGNOSIS — L03116 Cellulitis of left lower limb: Secondary | ICD-10-CM | POA: Diagnosis present

## 2024-03-28 DIAGNOSIS — M79672 Pain in left foot: Secondary | ICD-10-CM | POA: Diagnosis not present

## 2024-03-28 DIAGNOSIS — I4892 Unspecified atrial flutter: Secondary | ICD-10-CM | POA: Diagnosis present

## 2024-03-28 DIAGNOSIS — Z886 Allergy status to analgesic agent status: Secondary | ICD-10-CM | POA: Diagnosis not present

## 2024-03-28 DIAGNOSIS — Z794 Long term (current) use of insulin: Secondary | ICD-10-CM | POA: Diagnosis not present

## 2024-03-28 DIAGNOSIS — H8109 Meniere's disease, unspecified ear: Secondary | ICD-10-CM | POA: Diagnosis present

## 2024-03-28 DIAGNOSIS — I480 Paroxysmal atrial fibrillation: Secondary | ICD-10-CM | POA: Diagnosis present

## 2024-03-28 DIAGNOSIS — Z85528 Personal history of other malignant neoplasm of kidney: Secondary | ICD-10-CM | POA: Diagnosis not present

## 2024-03-28 DIAGNOSIS — R011 Cardiac murmur, unspecified: Secondary | ICD-10-CM | POA: Diagnosis not present

## 2024-03-28 DIAGNOSIS — J440 Chronic obstructive pulmonary disease with acute lower respiratory infection: Secondary | ICD-10-CM | POA: Diagnosis present

## 2024-03-28 DIAGNOSIS — I4891 Unspecified atrial fibrillation: Secondary | ICD-10-CM | POA: Diagnosis not present

## 2024-03-28 DIAGNOSIS — M109 Gout, unspecified: Secondary | ICD-10-CM | POA: Diagnosis present

## 2024-03-28 DIAGNOSIS — E876 Hypokalemia: Secondary | ICD-10-CM | POA: Diagnosis present

## 2024-03-28 DIAGNOSIS — Z7901 Long term (current) use of anticoagulants: Secondary | ICD-10-CM | POA: Diagnosis not present

## 2024-03-28 DIAGNOSIS — Z79899 Other long term (current) drug therapy: Secondary | ICD-10-CM | POA: Diagnosis not present

## 2024-03-28 DIAGNOSIS — E66813 Obesity, class 3: Secondary | ICD-10-CM | POA: Diagnosis present

## 2024-03-28 DIAGNOSIS — E1151 Type 2 diabetes mellitus with diabetic peripheral angiopathy without gangrene: Secondary | ICD-10-CM | POA: Diagnosis present

## 2024-03-28 DIAGNOSIS — Z8249 Family history of ischemic heart disease and other diseases of the circulatory system: Secondary | ICD-10-CM | POA: Diagnosis not present

## 2024-03-28 DIAGNOSIS — C649 Malignant neoplasm of unspecified kidney, except renal pelvis: Secondary | ICD-10-CM | POA: Diagnosis present

## 2024-03-28 DIAGNOSIS — Z6841 Body Mass Index (BMI) 40.0 and over, adult: Secondary | ICD-10-CM | POA: Diagnosis not present

## 2024-03-28 DIAGNOSIS — E042 Nontoxic multinodular goiter: Secondary | ICD-10-CM | POA: Diagnosis present

## 2024-03-28 DIAGNOSIS — G4733 Obstructive sleep apnea (adult) (pediatric): Secondary | ICD-10-CM | POA: Diagnosis present

## 2024-03-28 DIAGNOSIS — Z87891 Personal history of nicotine dependence: Secondary | ICD-10-CM | POA: Diagnosis not present

## 2024-03-28 DIAGNOSIS — E870 Hyperosmolality and hypernatremia: Secondary | ICD-10-CM | POA: Diagnosis not present

## 2024-03-28 DIAGNOSIS — I10 Essential (primary) hypertension: Secondary | ICD-10-CM | POA: Diagnosis present

## 2024-03-28 LAB — T4, FREE: Free T4: 1.22 ng/dL — ABNORMAL HIGH (ref 0.61–1.12)

## 2024-03-28 LAB — ECHOCARDIOGRAM COMPLETE BUBBLE STUDY
AR max vel: 2.53 cm2
AV Peak grad: 4.7 mmHg
Ao pk vel: 1.08 m/s

## 2024-03-28 LAB — CBC
HCT: 32.5 % — ABNORMAL LOW (ref 36.0–46.0)
Hemoglobin: 11.2 g/dL — ABNORMAL LOW (ref 12.0–15.0)
MCH: 29.4 pg (ref 26.0–34.0)
MCHC: 34.5 g/dL (ref 30.0–36.0)
MCV: 85.3 fL (ref 80.0–100.0)
Platelets: 184 10*3/uL (ref 150–400)
RBC: 3.81 MIL/uL — ABNORMAL LOW (ref 3.87–5.11)
RDW: 13.2 % (ref 11.5–15.5)
WBC: 9.2 10*3/uL (ref 4.0–10.5)
nRBC: 0 % (ref 0.0–0.2)

## 2024-03-28 LAB — BASIC METABOLIC PANEL WITH GFR
Anion gap: 10 (ref 5–15)
BUN: 15 mg/dL (ref 8–23)
CO2: 22 mmol/L (ref 22–32)
Calcium: 9 mg/dL (ref 8.9–10.3)
Chloride: 110 mmol/L (ref 98–111)
Creatinine, Ser: 0.93 mg/dL (ref 0.44–1.00)
GFR, Estimated: >60 mL/min (ref >60)
Glucose, Bld: 143 mg/dL — ABNORMAL HIGH (ref 70–99)
Potassium: 3.5 mmol/L (ref 3.5–5.1)
Sodium: 142 mmol/L (ref 135–145)

## 2024-03-28 LAB — TSH: TSH: 0.268 u[IU]/mL — ABNORMAL LOW (ref 0.350–4.500)

## 2024-03-28 LAB — HEPARIN LEVEL (UNFRACTIONATED)
Heparin Unfractionated: 0.57 [IU]/mL (ref 0.30–0.70)
Heparin Unfractionated: 0.73 [IU]/mL — ABNORMAL HIGH (ref 0.30–0.70)
Heparin Unfractionated: 0.47 [IU]/mL (ref 0.30–0.70)

## 2024-03-28 LAB — GLUCOSE, CAPILLARY: Glucose-Capillary: 123 mg/dL — ABNORMAL HIGH (ref 70–99)

## 2024-03-28 MED ORDER — DM-GUAIFENESIN ER 30-600 MG PO TB12
1.0000 | ORAL_TABLET | Freq: Two times a day (BID) | ORAL | Status: DC
  Administered 2024-03-28 – 2024-04-01 (×9): 1 via ORAL
  Filled 2024-03-28 (×9): qty 1

## 2024-03-28 MED ORDER — LOSARTAN POTASSIUM 25 MG PO TABS
25.0000 mg | ORAL_TABLET | Freq: Every day | ORAL | Status: DC
  Administered 2024-03-29 – 2024-04-01 (×4): 25 mg via ORAL
  Filled 2024-03-28 (×4): qty 1

## 2024-03-28 MED ORDER — PREDNISONE 20 MG PO TABS
40.0000 mg | ORAL_TABLET | Freq: Every day | ORAL | Status: DC
  Administered 2024-03-29: 40 mg via ORAL
  Filled 2024-03-28: qty 2

## 2024-03-28 MED ORDER — POTASSIUM CHLORIDE CRYS ER 20 MEQ PO TBCR
40.0000 meq | EXTENDED_RELEASE_TABLET | Freq: Once | ORAL | Status: AC
  Administered 2024-03-28: 40 meq via ORAL
  Filled 2024-03-28: qty 2

## 2024-03-28 MED ORDER — METOPROLOL TARTRATE 25 MG PO TABS
12.5000 mg | ORAL_TABLET | Freq: Two times a day (BID) | ORAL | Status: DC
  Administered 2024-03-28 – 2024-04-01 (×9): 12.5 mg via ORAL
  Filled 2024-03-28 (×9): qty 1

## 2024-03-28 MED ORDER — AZITHROMYCIN 250 MG PO TABS
500.0000 mg | ORAL_TABLET | Freq: Every day | ORAL | Status: AC
  Administered 2024-03-28 – 2024-03-30 (×3): 500 mg via ORAL
  Filled 2024-03-28 (×3): qty 2

## 2024-03-28 NOTE — Progress Notes (Signed)
 Occupational Therapy Evaluation Patient Details Name: Alyssa Leonard MRN: [id number] DOB: 12-Aug-1956 Today's Date: 03/28/2024   History of Present Illness   Pt is a 68 y.o. female with medical history significant of renal cell carcinoma on immunotherapy at [facility], HTN, diet-controlled diabetes, asthma, gout, anxiety, OSA on CPAP, morbid obesity, Mnire's disease, who presents with left foot pain. MD assessment includes: cellulitis of left foot versus acute gout flare, ambulatory dysfunction, hypomagnesemia, and HTN.     Clinical Impressions Pt was seen for OT evaluation this date. Prior to hospital admission, pt was indep amb with no DME, utilizing motorized carts when grocery shopping. Pt lives with her young granddaughter and daughter. Pt reports lots of family support at home. Pt presents to acute OT demonstrating impaired ADL performance and functional mobility 2/2 (See OT problem list for additional functional deficits). Pt currently requires CGA for all transfers and mobility with the RW for safety. Pt completed toileting transfer with CGA, verbal cues for hand placement. UB/LB bathing completed in standing at sink level with CGA throughout for safety. Pt endorses she is very eager to return to PLOF. Pt would benefit from skilled OT services to address noted impairments and functional limitations (see below for any additional details) in order to maximize safety and independence while minimizing falls risk and caregiver burden. OT will follow acutely.     If plan is discharge home, recommend the following:   A little help with walking and/or transfers;A little help with bathing/dressing/bathroom;Help with stairs or ramp for entrance;Assistance with cooking/housework     Functional Status Assessment   Patient has had a recent decline in their functional status and demonstrates the ability to make significant improvements in function in a reasonable and predictable amount of time.      Equipment Recommendations   BSC/3in1     Recommendations for Other Services         Precautions/Restrictions   Precautions Precautions: Fall Recall of Precautions/Restrictions: Intact Restrictions Weight Bearing Restrictions Per Provider Order: No     Mobility Bed Mobility Overal bed mobility: Needs Assistance Bed Mobility: Supine to Sit, Sit to Supine     Supine to sit: Contact guard Sit to supine: Min assist (LLE support)        Transfers Overall transfer level: Needs assistance Equipment used: Rolling walker (2 wheels) Transfers: Sit to/from Stand Sit to Stand: Contact guard assist           General transfer comment: Steady amb with RW, good safety awareness throughout      Balance Overall balance assessment: Needs assistance Sitting-balance support: Feet supported, No upper extremity supported Sitting balance-Leahy Scale: Normal     Standing balance support: Bilateral upper extremity supported, During functional activity Standing balance-Leahy Scale: Good Standing balance comment: No LOB during dynamic standing with RW                           ADL either performed or assessed with clinical judgement   ADL Overall ADL's : Needs assistance/impaired Eating/Feeding: Set up;Sitting   Grooming: Wash/dry hands;Wash/dry face;Standing;Contact guard assist (with RW) Grooming Details (indicate cue type and reason): Sink level grooming Upper Body Bathing: Standing;Contact guard assist   Lower Body Bathing: Sit to/from stand;Cueing for safety;Contact guard assist Lower Body Bathing Details (indicate cue type and reason): Sink level bathing     Lower Body Dressing: Set up Lower Body Dressing Details (indicate cue type and reason): Donning/doffing chrocs while seated on  the EOB     Toileting- Clothing Manipulation and Hygiene: Supervision/safety;Sitting/lateral lean       Functional mobility during ADLs: Contact guard assist;Rolling  walker (2 wheels) General ADL Comments: Set up assistance for seated dressing tasks, CGA sink level bathing     Vision         Perception         Praxis         Pertinent Vitals/Pain Pain Assessment Pain Assessment: No/denies pain     Extremity/Trunk Assessment Upper Extremity Assessment Upper Extremity Assessment: Overall WFL for tasks assessed   Lower Extremity Assessment Lower Extremity Assessment: Generalized weakness;LLE deficits/detail LLE Deficits / Details: LLE pain with mobility   Cervical / Trunk Assessment Cervical / Trunk Assessment: Normal   Communication Communication Communication: No apparent difficulties   Cognition Arousal: Alert Behavior During Therapy: WFL for tasks assessed/performed Cognition: No apparent impairments             OT - Cognition Comments: A/Ox4                 Following commands: Intact       Cueing  General Comments   Cueing Techniques: Verbal cues;Visual cues  Eager to progress mobility on this date   Exercises Exercises: Other exercises Other Exercises Other Exercises: Edu: Role of OT evaluation, safe ADL completion with use of RW   Shoulder Instructions      Home Living Family/patient expects to be discharged to:: Private residence Living Arrangements: Non-relatives/Friends;Other (Comment) Engineer, building services) Available Help at Discharge: Family;Friend(s);Available 24 hours/day Type of Home: House Home Access: Level entry     Home Layout: One level     Bathroom Shower/Tub: Chief Strategy Officer: Handicapped height     Home Equipment: Agricultural consultant (2 wheels);Cane - single point          Prior Functioning/Environment Prior Level of Function : Independent/Modified Independent;Driving             Mobility Comments: Ind amb without an AD limited community distances, max of 2-3 isles of a grocery store, uses store's motorized cart if it will be more than that, no fall  history ADLs Comments: Ind with ADLs    OT Problem List: Decreased strength;Decreased activity tolerance;Impaired balance (sitting and/or standing);Decreased safety awareness;Decreased knowledge of use of DME or AE   OT Treatment/Interventions: Self-care/ADL training;Therapeutic exercise;DME and/or AE instruction;Therapeutic activities;Patient/family education;Balance training      OT Goals(Current goals can be found in the care plan section)   Acute Rehab OT Goals Patient Stated Goal: return home OT Goal Formulation: With patient Time For Goal Achievement: 04/11/24 Potential to Achieve Goals: Good ADL Goals Pt Will Perform Grooming: with modified independence;standing Pt Will Perform Lower Body Dressing: with modified independence;sit to/from stand Pt Will Transfer to Toilet: with modified independence;regular height toilet;ambulating Pt Will Perform Toileting - Clothing Manipulation and hygiene: with modified independence;sit to/from stand   OT Frequency:  Min 2X/week    Co-evaluation              AM-PAC OT "6 Clicks" Daily Activity     Outcome Measure Help from another person eating meals?: None Help from another person taking care of personal grooming?: A Little Help from another person toileting, which includes using toliet, bedpan, or urinal?: A Little Help from another person bathing (including washing, rinsing, drying)?: A Little Help from another person to put on and taking off regular upper body clothing?: None Help from another person to  put on and taking off regular lower body clothing?: A Little 6 Click Score: 20   End of Session Equipment Utilized During Treatment: Gait belt;Rolling walker (2 wheels) Nurse Communication: Mobility status  Activity Tolerance: Patient tolerated treatment well Patient left: in bed;with call bell/phone within reach;with bed alarm set;with family/visitor present  OT Visit Diagnosis: Other abnormalities of gait and mobility  (R26.89);Unsteadiness on feet (R26.81)                Time: 1478-2956 OT Time Calculation (min): 31 min Charges:  OT General Charges $OT Visit: 1 Visit OT Evaluation $OT Eval Moderate Complexity: 1 Mod OT Treatments $Self Care/Home Management : 8-22 mins  Rosaria Common M.S. OTR/L  03/28/24, 3:47 PM

## 2024-03-28 NOTE — Plan of Care (Signed)
  Problem: Education: Goal: Knowledge of General Education information will improve Description: Including pain rating scale, medication(s)/side effects and non-pharmacologic comfort measures Outcome: Progressing   Problem: Health Behavior/Discharge Planning: Goal: Ability to manage health-related needs will improve Outcome: Progressing   Problem: Clinical Measurements: Goal: Ability to maintain clinical measurements within normal limits will improve Outcome: Progressing Goal: Diagnostic test results will improve Outcome: Progressing Goal: Cardiovascular complication will be avoided Outcome: Progressing   Problem: Activity: Goal: Risk for activity intolerance will decrease Outcome: Progressing   Problem: Coping: Goal: Level of anxiety will decrease Outcome: Progressing   Problem: Pain Managment: Goal: General experience of comfort will improve and/or be controlled Outcome: Progressing

## 2024-03-28 NOTE — Consult Note (Addendum)
 Pharmacy Consult Note - Anticoagulation  Pharmacy Consult for heparin  Indication: atrial fibrillation  PATIENT MEASUREMENTS: Height: 5' (152.4 cm) Weight: 129.4 kg (285 lb 4.4 oz) IBW/kg (Calculated) : 45.5 HEPARIN  DW (KG): 79  VITAL SIGNS: Temp: 97.8 F (36.6 C) (06/01 1100) Temp Source: Oral (06/01 1100) BP: 106/69 (06/01 0825) Pulse Rate: 104 (06/01 1100)  Recent Labs    03/27/24 1826 03/27/24 2057 03/27/24 2104 03/27/24 2104 03/28/24 0424 03/28/24 1110  HGB  --   --  12.0  --  11.2*  --   HCT  --   --  34.1*  --  32.5*  --   PLT  --   --  202  --  184  --   APTT  --   --  33  --   --   --   LABPROT  --  15.4*  --   --   --   --   INR  --  1.2  --   --   --   --   HEPARINUNFRC  --   --   --    < > 0.57 0.73*  CREATININE  --   --  1.01*  --  0.93  --   TROPONINIHS 11  --   --   --   --   --    < > = values in this interval not displayed.    Estimated Creatinine Clearance: 72.3 mL/min (by C-G formula based on SCr of 0.93 mg/dL).  PAST MEDICAL HISTORY: Past Medical History:  Diagnosis Date   Asthma    COPD (chronic obstructive pulmonary disease) (HCC)    Diabetes mellitus without complication (HCC)    GERD (gastroesophageal reflux disease)    History of colon polyps    Hypertension    Meniere disease    Renal cell carcinoma (HCC)    Sleep apnea    Went for sleep study test in January 2018 "I haven't heard back from test"    ASSESSMENT: 67 y.o. female with PMH including renal cell carcinoma, HTN, DM, obesity is presenting with left foot pain. Patient found to be in Afib during admission. CHA2DS2VASc is at least 4 (HTN, age +46, DM, female sex). Patient is not on chronic anticoagulation per chart review. Pharmacy has been consulted to initiate and manage heparin  intravenous infusion.  Pertinent medications: Medications Prior to Admission  Medication Sig Dispense Refill Last Dose/Taking   acetaminophen  (TYLENOL ) 500 MG tablet Take 1,000 mg by mouth every 6  (six) hours as needed for mild pain (pain score 1-3).   Taking As Needed   albuterol  (PROVENTIL  HFA;VENTOLIN  HFA) 108 (90 Base) MCG/ACT inhaler Inhale 2 puffs into the lungs every 6 (six) hours as needed for wheezing or shortness of breath.   Taking As Needed   amLODipine  (NORVASC ) 5 MG tablet Take 5 mg by mouth daily.   03/25/2024   Brinzolamide -Brimonidine  1-0.2 % SUSP Apply to eye 2 (two) times daily.   03/25/2024   colestipol  (COLESTID ) 1 g tablet Take 3 g by mouth 2 (two) times daily.   03/25/2024   diazepam  (VALIUM ) 2 MG tablet Take 2 mg by mouth daily.   Taking   dibucaine (NUPERCAINAL) 1 % OINT Place 1 Application rectally as needed for hemorrhoids.   Taking As Needed   docusate sodium  (COLACE) 50 MG capsule Take 50 mg by mouth daily.   03/25/2024   dorzolamide  (TRUSOPT ) 2 % ophthalmic solution Place 1 drop into both eyes 2 (two) times  daily.   03/25/2024   famotidine  (PEPCID ) 40 MG tablet Take 40 mg by mouth at bedtime.   03/25/2024   latanoprost  (XALATAN ) 0.005 % ophthalmic solution 1 drop at bedtime.   03/25/2024   loperamide  (IMODIUM ) 1 MG/5ML solution Take 1 mg by mouth daily as needed for diarrhea or loose stools.   Taking As Needed   losartan -hydrochlorothiazide  (HYZAAR) 50-12.5 MG tablet Take 1 tablet by mouth daily.   03/25/2024   meclizine  (ANTIVERT ) 25 MG tablet Take 25 mg by mouth 3 (three) times daily as needed for dizziness.   03/25/2024   ondansetron  (ZOFRAN ) 4 MG tablet Take 4 mg by mouth every 8 (eight) hours as needed for nausea or vomiting.   03/25/2024   potassium chloride  (KLOR-CON  M) 10 MEQ tablet Take 10 mEq by mouth daily.   03/25/2024   Testosterone 12.5 MG/ACT (1%) GEL Apply 12.5 mg topically daily.   03/25/2024   celecoxib (CELEBREX) 200 MG capsule Take 200 mg by mouth 2 (two) times daily. (Patient not taking: Reported on 08/15/2023)   Not Taking   Cholecalciferol (VITAMIN D) 2000 units CAPS Take by mouth. (Patient not taking: Reported on 08/15/2023)   Not Taking    doxycycline (VIBRA-TABS) 100 MG tablet Take 100 mg by mouth 2 (two) times daily. (Patient not taking: Reported on 08/15/2023)   Not Taking   Fluticasone Furoate 50 MCG/ACT AEPB Inhale into the lungs. (Patient not taking: Reported on 03/26/2024)   Not Taking   Insulin Pen Needle 31G X 5 MM MISC by Does not apply route. (Patient not taking: Reported on 11/27/2021)      liraglutide (VICTOZA) 18 MG/3ML SOPN Inject 18 mg into the skin daily. (Patient not taking: Reported on 08/15/2023)   Not Taking   meloxicam (MOBIC) 7.5 MG tablet Take 7.5 mg by mouth daily. (Patient not taking: Reported on 08/15/2023)   Not Taking   metoprolol succinate (TOPROL-XL) 25 MG 24 hr tablet Take 25 mg by mouth daily. (Patient not taking: Reported on 03/26/2024)   Not Taking   oxyCODONE -acetaminophen  (PERCOCET/ROXICET) 5-325 MG tablet Take 1 tablet by mouth every 4 (four) hours as needed for severe pain. (Patient not taking: Reported on 08/15/2023) 15 tablet 0 Not Taking   pantoprazole (PROTONIX) 40 MG tablet Take 40 mg by mouth daily. (Patient not taking: Reported on 03/26/2024)   Not Taking   Polyethylene Glycol POWD by Does not apply route. (Patient not taking: Reported on 08/15/2023)   Not Taking   triamcinolone acetonide 40 MG/ML SUSP 40 mg, mupirocin cream 2 % CREA 15 g Apply 1 application topically 3 (three) times daily. (Patient not taking: Reported on 08/15/2023)   Not Taking   Baseline anticoagulation labs: Recent Labs    03/27/24 0430 03/27/24 2057 03/27/24 2104 03/28/24 0424  APTT  --   --  33  --   INR  --  1.2  --   --   HGB 10.8*  --  12.0 11.2*  PLT 167  --  202 184   Goal(s) of therapy: Heparin  level 0.3 - 0.7 units/mL Monitor platelets by anticoagulation protocol: Yes  Monitoring: 0601 0424 HL 0.57, therapeutic x 1 0601 1110 HL 0.73, SUPRAtherapeutic 0601 1756 HL 0.47, therapeutic x 1   PLAN: Continue heparin  infusion at 1000 units/hour. Recheck heparin  level in 6 hours, then daily once at least  two levels are consecutively therapeutic. Monitor CBC daily while on heparin  infusion.  Will M. Alva Jewels, PharmD Clinical Pharmacist 03/28/2024 6:17 PM

## 2024-03-28 NOTE — Progress Notes (Addendum)
 Hospital Progress Update  Patient went into A-flutter with RVR this afternoon, with HR's in 120's to 130's No prior history of A-fib/flutter noted in chart or per patient. Pt having chest tightness and palpitations.  --Check troponin x 2 -- both negative --12 lead EKG  --Telemetry monitoring --Start IV amiodarone --Transfer to 2A/progressive --Consult Cardiology in AM --Further evaluation to cause is underway - check TSH, D-dimer

## 2024-03-28 NOTE — Progress Notes (Signed)
 Progress Note   Patient: Alyssa Leonard ZOX:[id number] DOB: 1956/02/16 DOA: 03/26/2024     0 DOS: the patient was seen and examined on 03/28/2024   Brief hospital course:  "Alyssa Leonard is a 68 y.o. female with medical history significant of renal cell carcinoma on Ipilimumab (IPI) and Nivolumab (NIVO) immunotherapy in [facility], hypertension, diet-controlled diabetes, asthma, gout, anxiety, OSA on CPAP, morbid obesity, Mnire's disease, who presents with left foot pain. ..." See H&P for full HPI on admission & ED course.  Patient was admitted to the hospital, started on IV antibiotics for presumed left foot cellulitis and IV steroids for potential inflammatory process such as gout or pseudogout.  Further hospital course and management as outlined below.   Assessment and Plan:  Left foot pain  Cellulitis of left foot versus Acute Gout Flare:  Patient p/w whole left foot pain, swelling, erythema and warmth, likely due to cellulitis.  Another potential differential diagnosis is gout which is difficult to rule out.   Uric acid level elevated at 10.1. Treating empirically for both cellulitis and gout. --Continue empiric IV vancomycin  and Rocephin  - day 3 of Abx --On day 3 steroids,    Transition Solu-Medrol  40 mg daily >> Prednisone  40 mg daily tomorrow AM --Add colchicine - 1.2 mg today, start 0.6 mg BID tomorrow --PRN Zofran  for nausea, and Tylenol , morphine  and Percocet for pain --Follow blood cultures  - neg to date --Trend inflammatory markers --PT evaluation  New onset Atrial Flutter with RVR 6/1 - HR's improved, range 64-104 recently. --On IV amiodarone and IV heparin  --Cardiology consulted --Echo pending --Telemetry monitoring --Maintain K>4, Mg>2 --CHADSVASC of (female, age x 1, HTN, diabetes, PAD) at least 5, will require long term anticoagulation. Transition to Eliquis when clinically appropriate. --Stop hydrochlorothiazide  and reduce losartan  --PO Lopressor 12.5 mg BID  started --If persistent and symptomatic, may require cardioversion --Mgmt of thyroid findings as below  Hyperthyroidism, Bilateral Thyroid Nodules Hx of left multinodular goiter TSH 0.268, free T4 1.22 -- likely contributed to onset of A-flutter --Follow up pending thyroid U/S --Definitive treatment likely to be RAI or thyroidectomy --Endocrinology follow up / referral  Acute bronchitis - based on cough, and CTA chest findings of diffuse bronchial thickening.  Pt notes hx of frequent bronchitis in past, when she smoked, quit 30 yrs ago. --Add 3 days PO Zithromax to antibiotic regimen  Ambulatory dysfunction - due to left foot pain from above --PT evaluation --Pain control PRN   Hypertension: --IV hydralazine  as needed --Amlodipine , Hyzaar   Hypokalemia: Potassium 3.2 on admission was replaced. --Monitor BMP, replace K as needed  Hypomagnesemia - Mg 1.5 --2 g IV Mg-sulfate to replace --Monitor Mg & replace as needed   Asthma, chronic: Stable --Bronchodilators and as needed Mucinex    Diet controlled diabetes mellitus without complication (HCC): Recent A1c 5.8, well-controlled.  Patient's not taking medications currently.  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 Pt is receiving Ipi/Nivo immunotherapy in [facility].  No surgery was done. --Follow-up with oncologist in [facility]   OSA (obstructive sleep apnea) --CPAP   Morbid obesity with BMI of 50.0-59.9, adult (HCC):Patient is Obesity Class III, with body weight  130.6Kg and BMI  56.25kg/m2.  - Encourage losing weight - Exercise and healthy diet      Subjective: Pt awake resting in bed, Echo tech about to start her exam.  Pt reports a  cough and chest tightness related to that. Otherwise chest pressure/tightness she had yseterday feeling better.  She denies prior A-fib/flutter history.  Reports  family history of blood clots.  Discussed thyroid nodules seen on CT, she'd just returned from thyroid U/S.     Physical Exam: Vitals:   03/28/24 0300 03/28/24 0400 03/28/24 0825 03/28/24 1100  BP: 100/84 109/82 106/69   Pulse: 87 64 (!) 104 (!) 104  Resp: 16  16   Temp: 97.6 F (36.4 C)  97.7 F (36.5 C) 97.8 F (36.6 C)  TempSrc: Oral  Oral Oral  SpO2: 93% 99%    Weight:      Height:       General exam: awake, alert, no acute distress, obese HEENT: moist mucus membranes, hearing grossly normal  Respiratory system: CTAB, no wheezes, rales or rhonchi, normal respiratory effort, coarse sounding cough. Cardiovascular system: normal S1/S2, RRR  Gastrointestinal system: soft, NT, ND Central nervous system: A&O x 3. no gross focal neurologic deficits, normal speech Extremities: slightly improved left foot swelling and erythema, less warmth today, still tender on palpation Skin: dry, intact, normal temperature Psychiatry: normal mood, congruent affect, judgement and insight appear normal   Data Reviewed:  Notable labs --   Normal BMP except glucose 143 Hbg 11.2 stable  TSH low 0.268 Free T4 elevated 1.22  D-dimer 1.22  CTA chest -- no PE to segmental level, (but subsegmental not visualized) or PNA, diffuse bronchial thickening.  Cardiomegaly with IVC and hepatic vein reflux, small stable pericardial effusion, stable pulmonary nodules, large right renal mass (known RCC), bilateral thyroid nodules left 3.6 cm (previously 1.5 cm on outside CT at [facility]) displaces the trachea to right of midline, hepatic steatosis.   PENDING -- Echo, Thyroid U/S   Family Communication: None present. Pt updated in detail.  Disposition: Status is: Inpatient Remains inpatient appropriate because: new onset A-flutter with RVR yesterday, evaluation is ongoing and cardiology consult pending     Planned Discharge Destination: Home with Home Health    Time spent: 52 minutes including time at  bedside and in coordination of care with staff and consultants and following up on test results.  Author: Montey Apa, DO 03/28/2024 12:10 PM  For on call review [email or web address].

## 2024-03-28 NOTE — Consult Note (Signed)
 Pharmacy Consult Note - Anticoagulation  Pharmacy Consult for heparin  Indication: atrial fibrillation  PATIENT MEASUREMENTS: Height: 5' (152.4 cm) Weight: 129.4 kg (285 lb 4.4 oz) IBW/kg (Calculated) : 45.5 HEPARIN  DW (KG): 79  VITAL SIGNS: Temp: 97.6 F (36.4 C) (06/01 0300) Temp Source: Oral (06/01 0300) BP: 109/82 (06/01 0400) Pulse Rate: 64 (06/01 0400)  Recent Labs    03/27/24 1826 03/27/24 2057 03/27/24 2104 03/28/24 0424  HGB  --   --  12.0 11.2*  HCT  --   --  34.1* 32.5*  PLT  --   --  202 184  APTT  --   --  33  --   LABPROT  --  15.4*  --   --   INR  --  1.2  --   --   HEPARINUNFRC  --   --   --  0.57  CREATININE  --   --  1.01* 0.93  TROPONINIHS 11  --   --   --     Estimated Creatinine Clearance: 72.3 mL/min (by C-G formula based on SCr of 0.93 mg/dL).  PAST MEDICAL HISTORY: Past Medical History:  Diagnosis Date   Asthma    COPD (chronic obstructive pulmonary disease) (HCC)    Diabetes mellitus without complication (HCC)    GERD (gastroesophageal reflux disease)    History of colon polyps    Hypertension    Meniere disease    Renal cell carcinoma (HCC)    Sleep apnea    Went for sleep study test in January 2018 "I haven't heard back from test"    ASSESSMENT: 68 y.o. female with PMH including renal cell carcinoma, HTN, DM, obesity is presenting with left foot pain. Patient found to be in Afib during admission. CHA2DS2VASc is at least 4 (HTN, age +42, DM, female sex). Patient is not on chronic anticoagulation per chart review. Pharmacy has been consulted to initiate and manage heparin  intravenous infusion.  Pertinent medications: Medications Prior to Admission  Medication Sig Dispense Refill Last Dose/Taking   acetaminophen  (TYLENOL ) 500 MG tablet Take 1,000 mg by mouth every 6 (six) hours as needed for mild pain (pain score 1-3).   Taking As Needed   albuterol  (PROVENTIL  HFA;VENTOLIN  HFA) 108 (90 Base) MCG/ACT inhaler Inhale 2 puffs into the lungs  every 6 (six) hours as needed for wheezing or shortness of breath.   Taking As Needed   amLODipine  (NORVASC ) 5 MG tablet Take 5 mg by mouth daily.   03/25/2024   Brinzolamide -Brimonidine  1-0.2 % SUSP Apply to eye 2 (two) times daily.   03/25/2024   colestipol  (COLESTID ) 1 g tablet Take 3 g by mouth 2 (two) times daily.   03/25/2024   diazepam  (VALIUM ) 2 MG tablet Take 2 mg by mouth daily.   Taking   dibucaine (NUPERCAINAL) 1 % OINT Place 1 Application rectally as needed for hemorrhoids.   Taking As Needed   docusate sodium  (COLACE) 50 MG capsule Take 50 mg by mouth daily.   03/25/2024   dorzolamide  (TRUSOPT ) 2 % ophthalmic solution Place 1 drop into both eyes 2 (two) times daily.   03/25/2024   famotidine  (PEPCID ) 40 MG tablet Take 40 mg by mouth at bedtime.   03/25/2024   latanoprost  (XALATAN ) 0.005 % ophthalmic solution 1 drop at bedtime.   03/25/2024   loperamide  (IMODIUM ) 1 MG/5ML solution Take 1 mg by mouth daily as needed for diarrhea or loose stools.   Taking As Needed   losartan -hydrochlorothiazide  (HYZAAR) 50-12.5 MG tablet  Take 1 tablet by mouth daily.   03/25/2024   meclizine  (ANTIVERT ) 25 MG tablet Take 25 mg by mouth 3 (three) times daily as needed for dizziness.   03/25/2024   ondansetron  (ZOFRAN ) 4 MG tablet Take 4 mg by mouth every 8 (eight) hours as needed for nausea or vomiting.   03/25/2024   potassium chloride  (KLOR-CON  M) 10 MEQ tablet Take 10 mEq by mouth daily.   03/25/2024   Testosterone 12.5 MG/ACT (1%) GEL Apply 12.5 mg topically daily.   03/25/2024   celecoxib (CELEBREX) 200 MG capsule Take 200 mg by mouth 2 (two) times daily. (Patient not taking: Reported on 08/15/2023)   Not Taking   Cholecalciferol (VITAMIN D) 2000 units CAPS Take by mouth. (Patient not taking: Reported on 08/15/2023)   Not Taking   doxycycline (VIBRA-TABS) 100 MG tablet Take 100 mg by mouth 2 (two) times daily. (Patient not taking: Reported on 08/15/2023)   Not Taking   Fluticasone Furoate 50 MCG/ACT AEPB Inhale  into the lungs. (Patient not taking: Reported on 03/26/2024)   Not Taking   Insulin Pen Needle 31G X 5 MM MISC by Does not apply route. (Patient not taking: Reported on 11/27/2021)      liraglutide (VICTOZA) 18 MG/3ML SOPN Inject 18 mg into the skin daily. (Patient not taking: Reported on 08/15/2023)   Not Taking   meloxicam (MOBIC) 7.5 MG tablet Take 7.5 mg by mouth daily. (Patient not taking: Reported on 08/15/2023)   Not Taking   metoprolol succinate (TOPROL-XL) 25 MG 24 hr tablet Take 25 mg by mouth daily. (Patient not taking: Reported on 03/26/2024)   Not Taking   oxyCODONE -acetaminophen  (PERCOCET/ROXICET) 5-325 MG tablet Take 1 tablet by mouth every 4 (four) hours as needed for severe pain. (Patient not taking: Reported on 08/15/2023) 15 tablet 0 Not Taking   pantoprazole (PROTONIX) 40 MG tablet Take 40 mg by mouth daily. (Patient not taking: Reported on 03/26/2024)   Not Taking   Polyethylene Glycol POWD by Does not apply route. (Patient not taking: Reported on 08/15/2023)   Not Taking   triamcinolone acetonide 40 MG/ML SUSP 40 mg, mupirocin cream 2 % CREA 15 g Apply 1 application topically 3 (three) times daily. (Patient not taking: Reported on 08/15/2023)   Not Taking    Goal(s) of therapy: Heparin  level 0.3 - 0.7 units/mL Monitor platelets by anticoagulation protocol: Yes   Baseline anticoagulation labs: Recent Labs    03/27/24 0430 03/27/24 2057 03/27/24 2104 03/28/24 0424  APTT  --   --  33  --   INR  --  1.2  --   --   HGB 10.8*  --  12.0 11.2*  PLT 167  --  202 184   Additional baseline aPTT and INR have been ordered.  0601 0424 HL 0.57, therapeutic x 1   PLAN: Continue heparin  infusion at 1100 units/hour. Recheck heparin  level in 6 hours to confirm, then daily once at least two levels are consecutively therapeutic. Monitor CBC daily while on heparin  infusion.  Coretta Dexter, PharmD, [facility] 03/28/2024 5:48 AM

## 2024-03-28 NOTE — Consult Note (Signed)
 Cardiology Consultation   Patient ID: Alyssa Leonard MRN: [id number]; DOB: 01-Feb-1956  Admit date: 03/26/2024 Date of Consult: 03/28/2024  PCP:  Benuel Brazier, MD   [facility] Providers Cardiologist:  New  Patient Profile: Alyssa Leonard is a 68 y.o. female with a hx of renal cell carcinoma on IPI and an IVF immunotherapy at [facility], hypertension, diet-controlled diabetes, asthma, gout, anxiety, OSA on CPAP, morbid obesity, Mnire's disease who is being seen 03/28/2024 for the evaluation of new onset A-fib at the request of Dr. Antoniette Batty.  History of Present Illness: Alyssa Leonard has not been seen by cardiology in the past. No prior MI or stenting. She smoked for 30 years. No alcohol or drug history. She follows at [facility] for renal cell carcinoma. She reports compliance with CPAP.  The patient presented to the ER at [facility] 03/26/2024 with left foot pain for the last 4 days found to have left food cellulitis vs acute gout flare.  She was found to have a white count of 7.4 potassium 3.2 temperature 99.6 F.  Blood pressure 147/79, heart rate 123, respiratory rate 22, normal O2.  X-ray of the foot showed diffuse soft tissue swelling.  On initial EKG on 6 1 showed normal sinus rhythm.  531 the patient reported chest pain.  Subsequent EKG showed atrial flutter with a heart rate in the 120s.  Patient was started on IV heparin  and IV amiodarone.  D-dimer was elevated.  DVT study was negative.  CT of the chest showed no occlusion or embolus, cardiomegaly with IVC, small pericardial effusion, aortic and coronary artery atherosclerosis, 4 mm right upper lobe nodule, large right renal mass with known renal cell carcinoma.  Patient reports chest pain from coughing.    Past Medical History:  Diagnosis Date   Asthma    COPD (chronic obstructive pulmonary disease) (HCC)    Diabetes mellitus without complication (HCC)    GERD (gastroesophageal reflux disease)    History of colon polyps     Hypertension    Meniere disease    Renal cell carcinoma (HCC)    Sleep apnea    Went for sleep study test in January 2018 "I haven't heard back from test"    Past Surgical History:  Procedure Laterality Date   ABDOMINAL HYSTERECTOMY     BARIATRIC SURGERY     CHOLECYSTECTOMY     COLONOSCOPY WITH PROPOFOL  N/A 12/11/2016   Procedure: COLONOSCOPY WITH PROPOFOL ;  Surgeon: Cassie Click, MD;  Location: [facility] ENDOSCOPY;  Service: Endoscopy;  Laterality: N/A;   COLONOSCOPY WITH PROPOFOL  N/A 11/27/2021   Procedure: COLONOSCOPY WITH PROPOFOL ;  Surgeon: Shane Darling, MD;  Location: [facility] ENDOSCOPY;  Service: Endoscopy;  Laterality: N/A;     Home Medications:  Prior to Admission medications   Medication Sig Start Date End Date Taking? Authorizing Provider  acetaminophen  (TYLENOL ) 500 MG tablet Take 1,000 mg by mouth every 6 (six) hours as needed for mild pain (pain score 1-3).   Yes [provider]  albuterol  (PROVENTIL  HFA;VENTOLIN  HFA) 108 (90 Base) MCG/ACT inhaler Inhale 2 puffs into the lungs every 6 (six) hours as needed for wheezing or shortness of breath.   Yes [provider]  amLODipine  (NORVASC ) 5 MG tablet Take 5 mg by mouth daily.   Yes [provider]  Brinzolamide -Brimonidine  1-0.2 % SUSP Apply to eye 2 (two) times daily.   Yes [provider]  colestipol  (COLESTID ) 1 g tablet Take 3 g by mouth 2 (two) times  daily. 01/30/24 01/29/25 Yes [provider]  diazepam  (VALIUM ) 2 MG tablet Take 2 mg by mouth daily.   Yes [provider]  dibucaine (NUPERCAINAL) 1 % OINT Place 1 Application rectally as needed for hemorrhoids.   Yes [provider]  docusate sodium  (COLACE) 50 MG capsule Take 50 mg by mouth daily.   Yes [provider]  dorzolamide  (TRUSOPT ) 2 % ophthalmic solution Place 1 drop into both eyes 2 (two) times daily.   Yes [provider]  famotidine  (PEPCID ) 40 MG tablet Take 40 mg by mouth at  bedtime. 02/12/24  Yes [provider]  latanoprost  (XALATAN ) 0.005 % ophthalmic solution 1 drop at bedtime.   Yes [provider]  loperamide  (IMODIUM ) 1 MG/5ML solution Take 1 mg by mouth daily as needed for diarrhea or loose stools.   Yes [provider]  losartan -hydrochlorothiazide  (HYZAAR) 50-12.5 MG tablet Take 1 tablet by mouth daily.   Yes [provider]  meclizine  (ANTIVERT ) 25 MG tablet Take 25 mg by mouth 3 (three) times daily as needed for dizziness.   Yes [provider]  ondansetron  (ZOFRAN ) 4 MG tablet Take 4 mg by mouth every 8 (eight) hours as needed for nausea or vomiting.   Yes [provider]  potassium chloride  (KLOR-CON  M) 10 MEQ tablet Take 10 mEq by mouth daily.   Yes [provider]  Testosterone 12.5 MG/ACT (1%) GEL Apply 12.5 mg topically daily. 01/05/24  Yes [provider]  celecoxib (CELEBREX) 200 MG capsule Take 200 mg by mouth 2 (two) times daily. Patient not taking: Reported on 08/15/2023    [provider]  Cholecalciferol (VITAMIN D) 2000 units CAPS Take by mouth. Patient not taking: Reported on 08/15/2023    [provider]  doxycycline (VIBRA-TABS) 100 MG tablet Take 100 mg by mouth 2 (two) times daily. Patient not taking: Reported on 08/15/2023    [provider]  Fluticasone Furoate 50 MCG/ACT AEPB Inhale into the lungs. Patient not taking: Reported on 03/26/2024    [provider]  Insulin Pen Needle 31G X 5 MM MISC by Does not apply route. Patient not taking: Reported on 11/27/2021    [provider]  liraglutide (VICTOZA) 18 MG/3ML SOPN Inject 18 mg into the skin daily. Patient not taking: Reported on 08/15/2023    [provider]  meloxicam (MOBIC) 7.5 MG tablet Take 7.5 mg by mouth daily. Patient not taking: Reported on 08/15/2023    [provider]  metoprolol succinate (TOPROL-XL) 25 MG 24 hr tablet Take 25 mg by mouth  daily. Patient not taking: Reported on 03/26/2024    [provider]  oxyCODONE -acetaminophen  (PERCOCET/ROXICET) 5-325 MG tablet Take 1 tablet by mouth every 4 (four) hours as needed for severe pain. Patient not taking: Reported on 08/15/2023 03/17/22   Sung, Jade J, MD  pantoprazole (PROTONIX) 40 MG tablet Take 40 mg by mouth daily. Patient not taking: Reported on 03/26/2024    [provider]  Polyethylene Glycol POWD by Does not apply route. Patient not taking: Reported on 08/15/2023    [provider]  triamcinolone acetonide 40 MG/ML SUSP 40 mg, mupirocin cream 2 % CREA 15 g Apply 1 application topically 3 (three) times daily. Patient not taking: Reported on 08/15/2023    [provider]    Scheduled Meds:  colchicine  0.6 mg Oral BID   colestipol   3 g Oral BID   dextromethorphan-guaiFENesin   1 tablet Oral BID  docusate  50 mg Oral Daily   dorzolamide   1 drop Both Eyes BID   famotidine   40 mg Oral QHS   feeding supplement  237 mL Oral BID BM   losartan   50 mg Oral Daily   And   hydrochlorothiazide   12.5 mg Oral Daily   latanoprost   1 drop Both Eyes QHS   methylPREDNISolone  (SOLU-MEDROL ) injection  40 mg Intravenous Daily   Continuous Infusions:  amiodarone 30 mg/hr (03/28/24 0301)   cefTRIAXone  (ROCEPHIN )  IV 2 g (03/27/24 2228)   heparin  1,100 Units/hr (03/27/24 2244)   vancomycin  1,250 mg (03/27/24 0930)   PRN Meds: acetaminophen , albuterol , diazepam , hydrALAZINE , loperamide  HCl, meclizine , morphine  injection, ondansetron  (ZOFRAN ) IV, oxyCODONE -acetaminophen   Allergies:    Allergies  Allergen Reactions   Latex Other (See Comments), Dermatitis, Hives and Swelling    unknown  Other reaction(s): Other (See Comments)  unknown  Other reaction(s): Other (See Comments) unknown    unknown   Aspirin Other (See Comments) and Nausea And Vomiting    Makes stomach burn  Other reaction(s): Other (See Comments)  Makes stomach burn  Makes  stomach burn    Other reaction(s): Other (See Comments) Makes stomach burn    Social History:   Social History   Socioeconomic History   Marital status: Married    Spouse name: Not on file   Number of children: Not on file   Years of education: Not on file   Highest education level: Not on file  Occupational History   Not on file  Tobacco Use   Smoking status: Former    Current packs/day: 0.00    Types: Cigarettes    Quit date: 11/14/1993    Years since quitting: 30.3   Smokeless tobacco: Never  Vaping Use   Vaping status: Never Used  Substance and Sexual Activity   Alcohol use: No   Drug use: No   Sexual activity: Not Currently  Other Topics Concern   Not on file  Social History Narrative   Not on file   Social Drivers of Health   Financial Resource Strain: Medium Risk (09/03/2023)   Received from [facility]   Overall Financial Resource Strain (CARDIA)    Difficulty of Paying Living Expenses: Somewhat hard  Food Insecurity: No Food Insecurity (03/26/2024)   Hunger Vital Sign    Worried About Running Out of Food in the Last Year: Never true    Ran Out of Food in the Last Year: Never true  Transportation Needs: No Transportation Needs (03/26/2024)   PRAPARE - Administrator, Civil Service (Medical): No    Lack of Transportation (Non-Medical): No  Physical Activity: Not on file  Stress: Not on file  Social Connections: Moderately Isolated (03/26/2024)   Social Connection and Isolation Panel [NHANES]    Frequency of Communication with Friends and Family: More than three times a week    Frequency of Social Gatherings with Friends and Family: More than three times a week    Attends Religious Services: More than 4 times per year    Active Member of [employer] or Organizations: No    Attends Banker Meetings: Never    Marital Status: Never married  Intimate Partner Violence: Not At Risk (03/26/2024)   Humiliation, Afraid, Rape, and Kick  questionnaire    Fear of Current or Ex-Partner: No    Emotionally Abused: No    Physically Abused: No    Sexually Abused: No    Family History:  Family History  Problem Relation Age of Onset   Hypertension Mother    Kidney disease Sister    Kidney disease Brother    Heart disease Brother    Heart disease Brother    Breast cancer Neg Hx      ROS:  Please see the history of present illness.   All other ROS reviewed and negative.     Physical Exam/Data: Vitals:   03/28/24 0000 03/28/24 0300 03/28/24 0400 03/28/24 0825  BP: 108/69 100/84 109/82 106/69  Pulse:  87 64 (!) 104  Resp: 19 16  16   Temp:  97.6 F (36.4 C)  97.7 F (36.5 C)  TempSrc:  Oral  Oral  SpO2: 100% 93% 99%   Weight:      Height:        Intake/Output Summary (Last 24 hours) at 03/28/2024 1017 Last data filed at 03/28/2024 2536 Gross per 24 hour  Intake 1651.52 ml  Output 100 ml  Net 1551.52 ml      03/27/2024    8:40 PM 03/26/2024    8:05 PM 12/25/2023    1:29 PM  Last 3 Weights  Weight (lbs) 285 lb 4.4 oz 288 lb 320 lb  Weight (kg) 129.4 kg 130.636 kg 145.151 kg     Body mass index is 55.71 kg/m.  General:  Well nourished, well developed, in no acute distress HEENT: normal Neck: no JVD Vascular: No carotid bruits; Distal pulses 2+ bilaterally Cardiac:  normal S1, S2; Irreg IRreg; no murmur  Lungs:  +wheezing, no rhonchi or rales  Abd: soft, nontender, no hepatomegaly  Ext: no edema Musculoskeletal:  No deformities, BUE and BLE strength normal and equal Skin: warm and dry  Neuro:  CNs 2-12 intact, no focal abnormalities noted Psych:  Normal affect   EKG:  The EKG was personally reviewed and demonstrates:  Aflutter, 2:1, 129bpm Telemetry:  Telemetry was personally reviewed and demonstrates:  Afib HR 80-100  Relevant CV Studies:  Echo ordered  Laboratory Data: High Sensitivity Troponin:   Recent Labs  Lab 03/27/24 1626 03/27/24 1826  TROPONINIHS 10 11     Chemistry Recent Labs   Lab 03/26/24 2134 03/27/24 0430 03/27/24 2104 03/28/24 0424  NA  --  137 139 142  K  --  3.4* 3.7 3.5  CL  --  106 107 110  CO2  --  21* 20* 22  GLUCOSE  --  197* 246* 143*  BUN  --  9 14 15   CREATININE  --  0.90 1.01* 0.93  CALCIUM  --  8.7* 9.4 9.0  MG 1.5*  --  2.1  --   GFRNONAA  --  >60 >60 >60  ANIONGAP  --  10 12 10     Recent Labs  Lab 03/27/24 2104  PROT 7.2  ALBUMIN 3.4*  AST 19  ALT 11  ALKPHOS 67  BILITOT 0.7   Lipids No results for input(s): "CHOL", "TRIG", "HDL", "LABVLDL", "LDLCALC", "CHOLHDL" in the last 168 hours.  Hematology Recent Labs  Lab 03/27/24 0430 03/27/24 2104 03/28/24 0424  WBC 5.4 8.3 9.2  RBC 3.66* 3.95 3.81*  HGB 10.8* 12.0 11.2*  HCT 31.3* 34.1* 32.5*  MCV 85.5 86.3 85.3  MCH 29.5 30.4 29.4  MCHC 34.5 35.2 34.5  RDW 13.2 13.2 13.2  PLT 167 202 184   Thyroid  Recent Labs  Lab 03/28/24 0420 03/28/24 0424  TSH  --  0.268*  FREET4 1.22*  --     BNPNo results  for input(s): "BNP", "PROBNP" in the last 168 hours.  DDimer  Recent Labs  Lab 03/27/24 2104  DDIMER 1.15*    Radiology/Studies:  CT Angio Chest Pulmonary Embolism (PE) W or WO Contrast Addendum Date: 03/28/2024 ADDENDUM REPORT: 03/28/2024 01:03 ADDENDUM: Clinical history is left lower extremity pain, suspected DVT and pulmonary embolism. Electronically Signed   By: Denman Fischer M.D.   On: 03/28/2024 01:03   Result Date: 03/28/2024 CLINICAL DATA:  Suspected pulmonary embolism. EXAM: CT ANGIOGRAPHY CHEST WITH CONTRAST TECHNIQUE: Multidetector CT imaging of the chest was performed using the standard protocol during bolus administration of intravenous contrast. Multiplanar CT image reconstructions and MIPs were obtained to evaluate the vascular anatomy. RADIATION DOSE REDUCTION: This exam was performed according to the departmental dose-optimization program which includes automated exposure control, adjustment of the mA and/or kV according to patient size and/or use of  iterative reconstruction technique. CONTRAST:  OMNIPAQUE  IOHEXOL  350 MG/ML SOLN COMPARISON:  Chest CT without contrast 08/18/2023, last chest series was PA Lat 02/13/2021. FINDINGS: Cardiovascular: There is mild cardiomegaly with a left chamber predominance. There is IVC and hepatic vein reflux which could be due to right heart dysfunction or tricuspid regurgitation. The pulmonary trunk slightly prominent 2.9 cm suggesting mild arterial hypertension, unchanged. Arterial opacification is diagnostic to the segmental level. No embolus is seen to the segmental arteries. The subsegmental arterial bed unopacified and not evaluated. Small pericardial effusion is again noted anteriorly, seen previously. Mild scattered single-vessel calcific plaque noted LAD coronary artery. There is aortic atherosclerosis without aneurysm, dissection or stenosis. The great vessels are widely patent with 2 vessel normal variant aortic arch. Mediastinum/Nodes: Heterogeneous thyroid with bilateral nodules, largest on left is 3.6 cm. This has not notably changed but has not been further assessed in our system. The left thyroid mass displaces the trachea right of midline. Nonemergent ultrasound follow-up is recommended unless it was recently performed elsewhere as previously recommended. Stable slightly prominent right paratracheal space nodes to 1 cm in short axis. This is unchanged. No other intrathoracic adenopathy is seen. Axillary spaces are clear. Negative esophagus and thoracic trachea, main bronchi. Lungs/Pleura: Central airways are smaller than previously which could be due to bronchospasm or respiration. There is diffuse bronchial thickening. There is a stable 4 mm pleural-based ground-glass nodule in the right upper lobe on 6:51. There is a stable 4 mm medial lingular solid nodule on 6:42. The lungs are otherwise clear. There is no consolidation or effusion. Upper Abdomen: Comparison is made with CT abdomen and pelvis with  contrast 12/25/2023. A large right renal mass partially visible consistent with the known renal cell carcinoma. Masslike fullness approaching 4 cm in the pancreatic uncinate process is also partially seen. Findings are unchanged to the extent currently imaged. Again noted is a 3.1 cm Bosniak 1 cyst in the anterior left kidney. There are no acute upper abdominal findings. There are postsurgical changes of the stomach, moderate hepatic steatosis. Musculoskeletal: Moderate thoracic spondylosis. No regional bone metastasis is seen. The visualized chest wall is unremarkable. Portions could not be included in the field due to habitus. Review of the MIP images confirms the above findings. IMPRESSION: 1. No evidence of arterial occlusion or embolus to the segmental level. The subsegmental arterial bed is unopacified and not evaluated. 2. Cardiomegaly with IVC and hepatic vein reflux which could be due to right heart dysfunction or tricuspid regurgitation. 3. Stable small pericardial effusion. 4. Aortic and coronary artery atherosclerosis. 5. Stable 4 mm right upper lobe ground-glass  nodule and 4 mm medial lingular solid nodule. 6. Diffuse bronchial thickening. Central airways are smaller than previously which could be due to bronchospasm or respiration. 7. Large right renal mass consistent with the known renal cell carcinoma. 8. Masslike fullness approaching 4 cm in the pancreatic uncinate process, partially seen. Findings are unchanged to the extent currently imaged compared with 12/25/2023. 9. Heterogeneous thyroid with bilateral nodules, largest on left is 3.6 cm. This has not been further assessed in our system. The left thyroid mass displaces the trachea right of midline. Nonemergent ultrasound follow-up is recommended unless it was recently performed elsewhere as previously recommended. 10. Hepatic steatosis. Aortic Atherosclerosis (ICD10-I70.0). Electronically Signed: By: Denman Fischer M.D. On: 03/28/2024 00:57    US  Venous Img Lower Bilateral (DVT) Result Date: 03/27/2024 CLINICAL DATA:  Edema and pain. EXAM: BILATERAL LOWER EXTREMITY VENOUS DOPPLER ULTRASOUND TECHNIQUE: Gray-scale sonography with graded compression, as well as color Doppler and duplex ultrasound were performed to evaluate the lower extremity deep venous systems from the level of the common femoral vein and including the common femoral, femoral, profunda femoral, popliteal and calf veins including the posterior tibial, peroneal and gastrocnemius veins when visible. The superficial great saphenous vein was also interrogated. Spectral Doppler was utilized to evaluate flow at rest and with distal augmentation maneuvers in the common femoral, femoral and popliteal veins. COMPARISON:  None Available. FINDINGS: RIGHT LOWER EXTREMITY Common Femoral Vein: No evidence of thrombus. Normal compressibility, respiratory phasicity and response to augmentation. Saphenofemoral Junction: No evidence of thrombus. Normal compressibility and flow on color Doppler imaging. Profunda Femoral Vein: No evidence of thrombus. Normal compressibility and flow on color Doppler imaging. Femoral Vein: No evidence of thrombus. Normal compressibility, respiratory phasicity and response to augmentation. Popliteal Vein: No evidence of thrombus. Normal compressibility, respiratory phasicity and response to augmentation. Calf Veins: No evidence of thrombus. Normal compressibility and flow on color Doppler imaging. Superficial Great Saphenous Vein: No evidence of thrombus. Normal compressibility. Venous Reflux:  None. Other Findings:  None. LEFT LOWER EXTREMITY Common Femoral Vein: No evidence of thrombus. Normal compressibility, respiratory phasicity and response to augmentation. Saphenofemoral Junction: No evidence of thrombus. Normal compressibility and flow on color Doppler imaging. Profunda Femoral Vein: No evidence of thrombus. Normal compressibility and flow on color Doppler imaging.  Femoral Vein: No evidence of thrombus. Normal compressibility, respiratory phasicity and response to augmentation. Popliteal Vein: No evidence of thrombus. Normal compressibility, respiratory phasicity and response to augmentation. Calf Veins: No evidence of thrombus. Normal compressibility and flow on color Doppler imaging. Superficial Great Saphenous Vein: No evidence of thrombus. Normal compressibility. Venous Reflux:  None. Other Findings:  None. IMPRESSION: No evidence of deep venous thrombosis in either lower extremity. Electronically Signed   By: Tyron Gallon M.D.   On: 03/27/2024 21:59   DG Foot Complete Left Result Date: 03/26/2024 CLINICAL DATA:  Foot pain EXAM: LEFT FOOT - COMPLETE 3+ VIEW COMPARISON:  None Available. FINDINGS: No fracture or malalignment. Dorsal degenerative osteophytes. Small plantar calcaneal spur. Diffuse soft tissue swelling. Mild degenerative changes at the first MTP joint. IMPRESSION: No acute osseous abnormality. Diffuse soft tissue swelling. Degenerative changes. Electronically Signed   By: Esmeralda Hedge M.D.   On: 03/26/2024 19:45     Assessment and Plan:  New onset Aflutter/fib - patient presented with left foot pain found to have left foot cellulitis vs acute gout flare, subsequently had chest pain fund to have new onset a flutter with heart rates in the 120s started on IV heparin   and IV amiodarone - HS trop negative x 2 -D-dimer elevated but chest CT negative for PE. - still in Afib with HR 80-100s - Echo ordered - Keep mag greater than 2 and K greater than 4 - TSH 0.26, free T41.22. US  thyroid ordered - CHADSVASC of (female, age x 1, HTN, diabetes, PAD) at least 5. She will require long-term a/c with Eliquis - will stop hydrochlorothiazide  and decrease Losartan  to have BP room for rate control.  Will add low dose BB - if patient does not medically convert, may need DCCV.  Hypertension - PTA amlodipine  5 mg daily, losartan -hydrochlorothiazide  50-12.5 mg  daily - Amlodipine  held on admission - BP soft. Stop hydrochlorothiazide  and decreased Losartan  as above - add lopressor 12.5mg  BID   CHA2DS2-VASc Score = 5   This indicates a 7.2% annual risk of stroke. The patient's score is based upon: CHF History: 0 HTN History: 1 Diabetes History: 1 Stroke History: 0 Vascular Disease History: 1 Age Score: 1 Gender Score: 1        For questions or updates, please contact [facility] Please consult [email or web address] for contact info under    Signed, Dameian Crisman Rebekah Canada, PA-C  03/28/2024 10:17 AM

## 2024-03-28 NOTE — TOC Initial Note (Addendum)
 Transition of Care [facility]) - Initial/Assessment Note    Patient Details  Name: Alyssa Leonard MRN: [id number] Date of Birth: [birth year]  Transition of Care [facility]) CM/SW Contact:    Loman Risk, RN Phone Number: 03/28/2024, 11:25 AM  Clinical Narrative:                       Admitted for: Cellulitis Admitted from: Home with significant other and granddaughter PCP: Entzminger  Current home health/prior home health/DME: RW  Patient agreeable to home health services.  Patient states that she does not have a preference of agency.  Referral made to Shaun with [facility].  Prior to discharge patient would like to see if she is eligible for Bariatric RW and [facility]  TOC consult for medication assistance.  Patient states that some of her cancer treatments are cost prohibitive, but that her oncologist is aware   Patient states that her significant other will transport at discharge   Patient Goals and CMS Choice            Expected Discharge Plan and Services                                              Prior Living Arrangements/Services                       Activities of Daily Living   ADL Screening (condition at time of admission) Independently performs ADLs?: Yes (appropriate for developmental age) Is the patient deaf or have difficulty hearing?: Yes Does the patient have difficulty seeing, even when wearing glasses/contacts?: No Does the patient have difficulty concentrating, remembering, or making decisions?: No  Permission Sought/Granted                  Emotional Assessment              Admission diagnosis:  Left foot pain [M79.672] Cellulitis of left lower extremity [L03.116] Atrial flutter with rapid ventricular response (HCC) [I48.92] Patient Active Problem List   Diagnosis Date Noted   Atrial flutter with rapid ventricular response (HCC) 03/27/2024   Left foot pain 03/26/2024   Hypokalemia 03/26/2024   Morbid  obesity with BMI of 50.0-59.9, adult (HCC) 03/26/2024   OSA (obstructive sleep apnea) 03/26/2024   Asthma, chronic 03/26/2024   Cellulitis of left foot 03/26/2024   Renal cell carcinoma (HCC)    Hypertension    GERD (gastroesophageal reflux disease)    Diabetes mellitus without complication (HCC)    COPD (chronic obstructive pulmonary disease) (HCC)    PCP:  Entzminger, Iantha Mainland, MD Pharmacy:   [facility] [phone] - [place], [place] - [address] [place] [address] Phone: [phone] Fax: [phone]     Social Drivers of Health (SDOH) Social History: SDOH Screenings   Food Insecurity: No Food Insecurity (03/26/2024)  Housing: Low Risk  (03/26/2024)  Transportation Needs: No Transportation Needs (03/26/2024)  Utilities: Not At Risk (03/26/2024)  Depression (PHQ2-9): Low Risk  (08/15/2023)  Financial Resource Strain: Medium Risk (09/03/2023)   Received from [facility]  Social Connections: Moderately Isolated (03/26/2024)  Tobacco Use: Medium Risk (03/26/2024)  Health Literacy: Medium Risk (09/03/2023)   Received from [facility]   SDOH Interventions:     Readmission Risk Interventions     No data  to display

## 2024-03-29 ENCOUNTER — Telehealth (HOSPITAL_COMMUNITY): Payer: Self-pay | Admitting: Pharmacy Technician

## 2024-03-29 ENCOUNTER — Other Ambulatory Visit (HOSPITAL_COMMUNITY): Payer: Self-pay

## 2024-03-29 DIAGNOSIS — M79672 Pain in left foot: Secondary | ICD-10-CM | POA: Diagnosis not present

## 2024-03-29 LAB — CBC
HCT: 30.7 % — ABNORMAL LOW (ref 36.0–46.0)
Hemoglobin: 10.6 g/dL — ABNORMAL LOW (ref 12.0–15.0)
MCH: 29.5 pg (ref 26.0–34.0)
MCHC: 34.5 g/dL (ref 30.0–36.0)
MCV: 85.5 fL (ref 80.0–100.0)
Platelets: 184 10*3/uL (ref 150–400)
RBC: 3.59 MIL/uL — ABNORMAL LOW (ref 3.87–5.11)
RDW: 13.4 % (ref 11.5–15.5)
WBC: 7.8 10*3/uL (ref 4.0–10.5)
nRBC: 0 % (ref 0.0–0.2)

## 2024-03-29 LAB — HEPARIN LEVEL (UNFRACTIONATED)
Heparin Unfractionated: 0.39 [IU]/mL (ref 0.30–0.70)
Heparin Unfractionated: 0.4 [IU]/mL (ref 0.30–0.70)

## 2024-03-29 LAB — BASIC METABOLIC PANEL WITH GFR
Anion gap: 8 (ref 5–15)
BUN: 24 mg/dL — ABNORMAL HIGH (ref 8–23)
CO2: 21 mmol/L — ABNORMAL LOW (ref 22–32)
Calcium: 8.9 mg/dL (ref 8.9–10.3)
Chloride: 109 mmol/L (ref 98–111)
Creatinine, Ser: 1.01 mg/dL — ABNORMAL HIGH (ref 0.44–1.00)
GFR, Estimated: >60 mL/min (ref >60)
Glucose, Bld: 133 mg/dL — ABNORMAL HIGH (ref 70–99)
Potassium: 3.8 mmol/L (ref 3.5–5.1)
Sodium: 138 mmol/L (ref 135–145)

## 2024-03-29 LAB — GLUCOSE, CAPILLARY: Glucose-Capillary: 114 mg/dL — ABNORMAL HIGH (ref 70–99)

## 2024-03-29 LAB — MAGNESIUM: Magnesium: 2.1 mg/dL (ref 1.7–2.4)

## 2024-03-29 MED ORDER — SODIUM CHLORIDE 0.9 % IV SOLN
2.0000 g | INTRAVENOUS | Status: AC
  Administered 2024-03-29 – 2024-03-30 (×2): 2 g via INTRAVENOUS
  Filled 2024-03-29 (×2): qty 20

## 2024-03-29 MED ORDER — AMIODARONE HCL 200 MG PO TABS
400.0000 mg | ORAL_TABLET | Freq: Two times a day (BID) | ORAL | Status: DC
  Administered 2024-03-29 – 2024-04-01 (×6): 400 mg via ORAL
  Filled 2024-03-29 (×6): qty 2

## 2024-03-29 MED ORDER — VANCOMYCIN HCL 1250 MG/250ML IV SOLN
1250.0000 mg | INTRAVENOUS | Status: AC
  Administered 2024-03-30: 1250 mg via INTRAVENOUS
  Filled 2024-03-29: qty 250

## 2024-03-29 MED ORDER — RIVAROXABAN 20 MG PO TABS
20.0000 mg | ORAL_TABLET | Freq: Every day | ORAL | Status: DC
  Administered 2024-03-29 – 2024-03-31 (×3): 20 mg via ORAL
  Filled 2024-03-29 (×3): qty 1

## 2024-03-29 MED ORDER — PREDNISONE 20 MG PO TABS
30.0000 mg | ORAL_TABLET | Freq: Every day | ORAL | Status: DC
  Administered 2024-03-30: 30 mg via ORAL
  Filled 2024-03-29: qty 1

## 2024-03-29 MED ORDER — SENNOSIDES-DOCUSATE SODIUM 8.6-50 MG PO TABS
1.0000 | ORAL_TABLET | Freq: Two times a day (BID) | ORAL | Status: DC
  Administered 2024-03-29 (×2): 1 via ORAL
  Filled 2024-03-29 (×3): qty 1

## 2024-03-29 MED ORDER — COLESTIPOL HCL 1 G PO TABS
1.0000 g | ORAL_TABLET | Freq: Two times a day (BID) | ORAL | Status: DC
  Administered 2024-03-29 – 2024-04-01 (×6): 1 g via ORAL
  Filled 2024-03-29 (×6): qty 1

## 2024-03-29 MED ORDER — POLYETHYLENE GLYCOL 3350 17 G PO PACK
17.0000 g | PACK | Freq: Every day | ORAL | Status: DC
  Administered 2024-03-29: 17 g via ORAL
  Filled 2024-03-29 (×2): qty 1

## 2024-03-29 NOTE — Plan of Care (Signed)

## 2024-03-29 NOTE — Progress Notes (Signed)
 Physical Therapy Treatment Patient Details Name: Alyssa Leonard MRN: [id number] DOB: 06-01-1956 Today's Date: 03/29/2024   History of Present Illness Pt is a 68 y.o. female with medical history significant of renal cell carcinoma on immunotherapy at [facility], HTN, diet-controlled diabetes, asthma, gout, anxiety, OSA on CPAP, morbid obesity, Mnire's disease, who presents with left foot pain. MD assessment includes: cellulitis of left foot versus acute gout flare, ambulatory dysfunction, hypomagnesemia, and HTN.    PT Comments  Great tolerance for mobility today despite ongoing soreness in Left foot. Heavy reliance on RW to offset weight through L LE while ambulating several times in room with RW and Supervision. Slight fatigue with minimal SOB upon exertion, SpO2 on RA 100%, HR 84. Good safety awareness and overall strength requiring Supervision for transfers and bed mobility. Pt has a RW at home, recs for HHPT remain appropriate.   If plan is discharge home, recommend the following: A little help with walking and/or transfers;A little help with bathing/dressing/bathroom;Assistance with cooking/housework;Assist for transportation   Can travel by private vehicle        Equipment Recommendations  None recommended by PT (Pt has a bariatric RW at home)    Recommendations for Other Services       Precautions / Restrictions Precautions Precautions: Fall Recall of Precautions/Restrictions: Intact Restrictions Weight Bearing Restrictions Per Provider Order: No     Mobility  Bed Mobility Overal bed mobility: Needs Assistance Bed Mobility: Supine to Sit     Supine to sit: Supervision, Used rails     General bed mobility comments: no direct assist required for sup<>sit, use of bed rails    Transfers Overall transfer level: Needs assistance Equipment used: Rolling walker (2 wheels) Transfers: Sit to/from Stand Sit to Stand: Supervision           General transfer comment: SBA, VC for  hand placement from bed and BSC    Ambulation/Gait Ambulation/Gait assistance: Contact guard assist Gait Distance (Feet): 20 Feet Assistive device: Rolling walker (2 wheels) (Bariatric) Gait Pattern/deviations: Step-to pattern, Decreased step length - right, Decreased stance time - left, Antalgic Gait velocity: decreased     General Gait Details: Step-to sequencing education provided for L foot pain control with weight bearing with multi-modal cues for proper sequencing   Stairs             Wheelchair Mobility     Tilt Bed    Modified Rankin (Stroke Patients Only)       Balance Overall balance assessment: Needs assistance Sitting-balance support: Feet supported, No upper extremity supported Sitting balance-Leahy Scale: Normal Sitting balance - Comments: Pt sat EOB for several minutes reaching out of BOS without LOB   Standing balance support: During functional activity, Bilateral upper extremity supported, Reliant on assistive device for balance Standing balance-Leahy Scale: Fair Standing balance comment: RW during gait due to sore L foot                            Communication Communication Communication: No apparent difficulties  Cognition Arousal: Alert Behavior During Therapy: WFL for tasks assessed/performed   PT - Cognitive impairments: No apparent impairments                         Following commands: Intact      Cueing Cueing Techniques: Verbal cues, Visual cues  Exercises General Exercises - Lower Extremity Ankle Circles/Pumps: AROM, Both, 5 reps Long Arc  Quad: AROM, Both, 10 reps Other Exercises Other Exercises: Pt educated in role of PT, discussed home set up and DME    General Comments General comments (skin integrity, edema, etc.): R LE slightly edematous at baseline. No swelling or redness L LE      Pertinent Vitals/Pain Pain Assessment Pain Assessment: 0-10 Pain Score: 4  Pain Location: L foot with weight  bearing only, 0/10 at rest Pain Descriptors / Indicators: Sore Pain Intervention(s): Limited activity within patient's tolerance    Home Living                          Prior Function            PT Goals (current goals can now be found in the care plan section) Acute Rehab PT Goals Patient Stated Goal: To walk without pain Progress towards PT goals: Progressing toward goals    Frequency    Min 2X/week      PT Plan      Co-evaluation              AM-PAC PT "6 Clicks" Mobility   Outcome Measure  Help needed turning from your back to your side while in a flat bed without using bedrails?: A Little Help needed moving from lying on your back to sitting on the side of a flat bed without using bedrails?: A Little Help needed moving to and from a bed to a chair (including a wheelchair)?: A Little Help needed standing up from a chair using your arms (e.g., wheelchair or bedside chair)?: A Little Help needed to walk in hospital room?: A Little Help needed climbing 3-5 steps with a railing? : A Lot 6 Click Score: 17    End of Session Equipment Utilized During Treatment: Gait belt Activity Tolerance: Patient tolerated treatment well Patient left: in chair;with call bell/phone within reach;with chair alarm set Nurse Communication: Mobility status PT Visit Diagnosis: Pain Pain - Right/Left: Left Pain - part of body: Ankle and joints of foot     Time: 9604-5409 PT Time Calculation (min) (ACUTE ONLY): 41 min  Charges:    $Gait Training: 8-22 mins $Therapeutic Exercise: 8-22 mins $Therapeutic Activity: 8-22 mins PT General Charges $$ ACUTE PT VISIT: 1 Visit                    Melvyn Stagers, PTA  Diona Franklin 03/29/2024, 2:10 PM

## 2024-03-29 NOTE — Progress Notes (Signed)
 Occupational Therapy Treatment Patient Details Name: Alyssa Leonard MRN: [id number] DOB: April 30, 1956 Today's Date: 03/29/2024   History of present illness Pt is a 68 y.o. female with medical history significant of renal cell carcinoma on immunotherapy at [facility], HTN, diet-controlled diabetes, asthma, gout, anxiety, OSA on CPAP, morbid obesity, Mnire's disease, who presents with left foot pain. MD assessment includes: cellulitis of left foot versus acute gout flare, ambulatory dysfunction, hypomagnesemia, and HTN.   OT comments  Pt seen for OT tx. Pt completed grooming tasks both standing at the sink and seated EOB, modifications made to support L foot pain with weight bearing. Pt educated in strategies to support ADL/mobility safety and independence while minimizing risk of increased L foot pain during ADL/IADL/mobility with education in activity pacing, work simplificaiton, and AE/DME. Supv-SBA for all aspects of ADL and mobility with RW. Pt progressing, continues to benefit.       If plan is discharge home, recommend the following:  A little help with walking and/or transfers;A little help with bathing/dressing/bathroom;Help with stairs or ramp for entrance;Assistance with cooking/housework   Equipment Recommendations  BSC/3in1;Other (comment) (bariatric)    Recommendations for Other Services      Precautions / Restrictions Precautions Precautions: Fall Recall of Precautions/Restrictions: Intact Restrictions Weight Bearing Restrictions Per Provider Order: No       Mobility Bed Mobility Overal bed mobility: Modified Independent             General bed mobility comments: no direct assist required for sup<>sit, use of bed rails    Transfers Overall transfer level: Needs assistance Equipment used: Rolling walker (2 wheels) Transfers: Sit to/from Stand Sit to Stand: Supervision           General transfer comment: SBA, VC for hand placement     Balance Overall balance  assessment: Needs assistance Sitting-balance support: Feet supported, No upper extremity supported Sitting balance-Leahy Scale: Normal     Standing balance support: During functional activity, Single extremity supported, No upper extremity supported Standing balance-Leahy Scale: Fair Standing balance comment: fair static without UE support versus UE support on counter while completing grooming tasks                           ADL either performed or assessed with clinical judgement   ADL Overall ADL's : Needs assistance/impaired     Grooming: Wash/dry hands;Wash/dry face;Standing;Contact guard assist;Oral care;Applying deodorant;Brushing hair;Supervision/safety;Sitting Grooming Details (indicate cue type and reason): pt stood at sink for washing face, brushing teeth, and applying deodorant. She then sat EOB to comb her hair 2/2 L foot pain with prolonged standing.                                    Extremity/Trunk Assessment              Vision       Restaurant manager, fast food Communication: No apparent difficulties   Cognition Arousal: Alert Behavior During Therapy: WFL for tasks assessed/performed Cognition: No apparent impairments                                        Cueing      Exercises Other Exercises Other Exercises: Pt educated in strategies to support ADL/mobility  safety and independence while minimizing risk of increased L foot pain during ADL/IADL/mobility with education in activity pacing, work simplificaiton, and AE/DME    Shoulder Instructions       General Comments      Pertinent Vitals/ Pain       Pain Assessment Pain Assessment: 0-10 Pain Score: 5  Pain Location: L foot with weight bearing only, 0/10 at rest Pain Descriptors / Indicators: Sore Pain Intervention(s): Limited activity within patient's tolerance, Monitored during session, Repositioned  Home Living                                           Prior Functioning/Environment              Frequency  Min 2X/week        Progress Toward Goals  OT Goals(current goals can now be found in the care plan section)  Progress towards OT goals: Progressing toward goals  Acute Rehab OT Goals Patient Stated Goal: return home OT Goal Formulation: With patient Time For Goal Achievement: 04/11/24 Potential to Achieve Goals: Good  Plan      Co-evaluation                 AM-PAC OT "6 Clicks" Daily Activity     Outcome Measure   Help from another person eating meals?: None Help from another person taking care of personal grooming?: A Little Help from another person toileting, which includes using toliet, bedpan, or urinal?: A Little Help from another person bathing (including washing, rinsing, drying)?: A Little Help from another person to put on and taking off regular upper body clothing?: None Help from another person to put on and taking off regular lower body clothing?: A Little 6 Click Score: 20    End of Session Equipment Utilized During Treatment: Rolling walker (2 wheels)  OT Visit Diagnosis: Other abnormalities of gait and mobility (R26.89);Unsteadiness on feet (R26.81)   Activity Tolerance Patient tolerated treatment well   Patient Left in bed;with call bell/phone within reach;with bed alarm set   Nurse Communication Other (comment) (bari recliner)        Time: 8295-6213 OT Time Calculation (min): 22 min  Charges: OT General Charges $OT Visit: 1 Visit OT Treatments $Self Care/Home Management : 8-22 mins  Berenda Breaker., MPH, MS, OTR/L ascom [phone] 03/29/24, 12:18 PM

## 2024-03-29 NOTE — Progress Notes (Signed)
 Progress Note   Patient: Alyssa Leonard:[id number] DOB: [birth year] DOA: 03/26/2024     1 DOS: the patient was seen and examined on 03/29/2024   Brief hospital course:  "Alyssa Leonard is a 68 y.o. female with medical history significant of renal cell carcinoma on Ipilimumab (IPI) and Nivolumab (NIVO) immunotherapy in [facility], hypertension, diet-controlled diabetes, asthma, gout, anxiety, OSA on CPAP, morbid obesity, Mnire's disease, who presents with left foot pain. ..." See H&P for full HPI on admission & ED course.  Patient was admitted to the hospital, started on IV antibiotics for presumed left foot cellulitis and IV steroids for potential inflammatory process such as gout or pseudogout.  Further hospital course and management as outlined below.   Assessment and Plan:  Left foot pain  Cellulitis of left foot versus Acute Gout Flare:  Patient p/w whole left foot pain, swelling, erythema and warmth, likely due to cellulitis.  Another potential differential diagnosis is gout which is difficult to rule out.   Uric acid level elevated at 10.1. Treating empirically for both cellulitis and gout. --Continue empiric IV vancomycin  and Rocephin  - day 4 / 5 of Abx --On day 4 steroids,    Transition Solu-Medrol  40 mg daily >> Prednisone  40 mg today >> 30 mg tomorrow & taper --Added colchicine - continue 0.6 mg BID  --PRN Zofran  for nausea, and Tylenol , morphine  and Percocet for pain --Follow blood cultures  - neg to date --Trend inflammatory markers --PT evaluation  New onset Atrial Flutter with RVR 6/1 - HR's improved, range 64-104 recently. Echo EF 50-55%, mild LVH, mild MR, severe LA dilation --On IV amiodarone and IV heparin  --Cardiology consulted --PO metoprolol started 6/1 - continue --Telemetry monitoring --Maintain K>4, Mg>2 --CHADSVASC of (female, age x 1, HTN, diabetes, PAD) at least 5, will require long term anticoagulation. Transition to Eliquis when clinically  appropriate. --Stop hydrochlorothiazide  and reduce losartan  --PO Lopressor 12.5 mg BID started --If persistent and symptomatic, may require cardioversion --Mgmt of thyroid findings as below  Bilateral Thyroid Nodules and Goiter Hx of left multinodular goiter TSH 0.268, free T4 1.22 -- likely contributed to onset of A-flutter Thyroid U/S done 6/1 - see report.  Left thyroid gland entirely goiterous tissue, multiple other small nodules. --Definitive treatment likely to be RAI or thyroidectomy --Endocrinology follow up / referral --Per U/S impression -- Nodule # 2 is a small 1.1 cm TI-RADS category 4 nodule in the right mid gland and meets criteria for imaging surveillance.  Recommend follow-up ultrasound in 1 year  Acute bronchitis - based on cough, and CTA chest findings of diffuse bronchial thickening.  Pt notes hx of frequent bronchitis in past, when she smoked, quit 30 yrs ago. --Added 3 days PO Zithromax to antibiotic regimen  Ambulatory dysfunction - due to left foot pain from above --PT evaluation --Pain control PRN   Hypertension: --IV hydralazine  as needed --Amlodipine , Hyzaar   Hypokalemia: Potassium 3.2 on admission was replaced. --Monitor BMP, replace K as needed  Hypomagnesemia - Mg 1.5 was replaced --Monitor Mg & replace as needed   Asthma, chronic: Stable --Bronchodilators and as needed Mucinex    Diet controlled diabetes mellitus without complication (HCC): Recent A1c 5.8, well-controlled.  Patient's not taking medications currently.  --AM CBG's to monitor   Renal cell carcinoma 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 Pt is receiving Ipi/Nivo immunotherapy in [facility].  No surgery was done. --Follow-up with oncologist in [facility]  OSA (obstructive sleep apnea) --CPAP   Morbid obesity with BMI of 50.0-59.9, adult (HCC):Patient is Obesity Class III, with body weight  130.6Kg  and BMI  56.25kg/m2.  - Encourage losing weight - Exercise and healthy diet      Subjective: Pt awake resting in bed when seen this AM.  She continues to have a cough.  We discussed echo and thyroid u/s results.  Pt notes left foot pain has improved.  She states this pain was quite different than prior gout pain she's had, feels more likely it was infection    Physical Exam: Vitals:   03/28/24 2349 03/29/24 0340 03/29/24 0818 03/29/24 1134  BP: 112/73 107/79 118/67 120/76  Pulse: (!) 59 63 66 68  Resp: 16 16 17 18   Temp: 98.1 F (36.7 C) 97.7 F (36.5 C) 97.7 F (36.5 C) 97.7 F (36.5 C)  TempSrc:   Oral Oral  SpO2: 100% 100%    Weight:      Height:       General exam: awake, alert, no acute distress, obese HEENT: moist mucus membranes, hearing grossly normal  Respiratory system: on room air, lungs clear diminished throughout, normal respiratory effort, coarse sounding cough. Cardiovascular system: normal S1/S2, RRR  Gastrointestinal system: soft, NT, ND Central nervous system: A&O x 3. no gross focal neurologic deficits, normal speech Extremities:  left foot with minimal remaining swelling but warmth tenderness and erythema significantly improved Skin: dry, intact, normal temperature Psychiatry: normal mood, congruent affect, judgement and insight appear normal   Data Reviewed:  Notable labs --   Normal BMP except bicarb 21, glucose 133, Cr 0.93 >> 1.01 Hbg 10.6 stable   Echo 6/1 -- EF 50-55%, mild LVH, mild MR, severe LA dilation, no interatrial shunt  Thyroid U/S -- diffusely enlarged heterogenous and lobular thyroid with multiple small nodules, left gland is entirely replaced by goiter with no discrete nodule.   Family Communication: None present. Pt updated in detail.  Disposition: Status is: Inpatient Remains inpatient appropriate because: new onset A-flutter remains on IV amio and heparin .  Anticipate d/c in 24-48 hours once transitioned to PO meds and  ongoing clinical improvement.     Planned Discharge Destination: Home with Home Health    Time spent: 45 minutes   Author: Montey Apa, DO 03/29/2024 12:25 PM  For on call review [email or web address].

## 2024-03-29 NOTE — Telephone Encounter (Signed)
 Patient Product/process development scientist completed.    The patient is insured through [facility]. Patient has Medicare and is not eligible for a copay card, but may be able to apply for patient assistance or Medicare RX Payment Plan (Patient Must reach out to their plan, if eligible for payment plan), if available.    Ran test claim for Xarelto 20 mg and the current 30 day co-pay is $172.23 due to a deductible.  Will be $47.00 once deductible is met.   This test claim was processed through  [facility]- copay amounts may vary at other pharmacies due to pharmacy/plan contracts, or as the patient moves through the different stages of their insurance plan.     Morgan Arab, CPHT Pharmacy Technician III Certified Patient Advocate [facility] Patient Advocate Team Direct Number: [phone]  Fax: [phone]

## 2024-03-29 NOTE — Consult Note (Signed)
 Pharmacy Consult Note - Anticoagulation  Pharmacy Consult for heparin  Indication: atrial fibrillation  PATIENT MEASUREMENTS: Height: 5' (152.4 cm) Weight: 129.4 kg (285 lb 4.4 oz) IBW/kg (Calculated) : 45.5 HEPARIN  DW (KG): 79  VITAL SIGNS: Temp: 97.7 F (36.5 C) (06/02 0340) Temp Source: Oral (06/01 2026) BP: 107/79 (06/02 0340) Pulse Rate: 63 (06/02 0340)  Recent Labs    03/27/24 1826 03/27/24 2057 03/27/24 2104 03/28/24 0424 03/29/24 0331  HGB  --   --  12.0   < > 10.6*  HCT  --   --  34.1*   < > 30.7*  PLT  --   --  202   < > 184  APTT  --   --  33  --   --   LABPROT  --  15.4*  --   --   --   INR  --  1.2  --   --   --   HEPARINUNFRC  --   --   --    < > 0.40  CREATININE  --   --  1.01*   < > 1.01*  TROPONINIHS 11  --   --   --   --    < > = values in this interval not displayed.    Estimated Creatinine Clearance: 66.6 mL/min (A) (by C-G formula based on SCr of 1.01 mg/dL (H)).  PAST MEDICAL HISTORY: Past Medical History:  Diagnosis Date   Asthma    COPD (chronic obstructive pulmonary disease) (HCC)    Diabetes mellitus without complication (HCC)    GERD (gastroesophageal reflux disease)    History of colon polyps    Hypertension    Meniere disease    Renal cell carcinoma (HCC)    Sleep apnea    Went for sleep study test in January 2018 "I haven't heard back from test"    ASSESSMENT: 68 y.o. female with PMH including renal cell carcinoma, HTN, DM, obesity is presenting with left foot pain. Patient found to be in Afib during admission. CHA2DS2VASc is at least 4 (HTN, age +43, DM, female sex). Patient is not on chronic anticoagulation per chart review. Pharmacy has been consulted to initiate and manage heparin  intravenous infusion.  Pertinent medications: Medications Prior to Admission  Medication Sig Dispense Refill Last Dose/Taking   acetaminophen  (TYLENOL ) 500 MG tablet Take 1,000 mg by mouth every 6 (six) hours as needed for mild pain (pain score  1-3).   Taking As Needed   albuterol  (PROVENTIL  HFA;VENTOLIN  HFA) 108 (90 Base) MCG/ACT inhaler Inhale 2 puffs into the lungs every 6 (six) hours as needed for wheezing or shortness of breath.   Taking As Needed   amLODipine  (NORVASC ) 5 MG tablet Take 5 mg by mouth daily.   03/25/2024   Brinzolamide -Brimonidine  1-0.2 % SUSP Apply to eye 2 (two) times daily.   03/25/2024   colestipol  (COLESTID ) 1 g tablet Take 3 g by mouth 2 (two) times daily.   03/25/2024   diazepam  (VALIUM ) 2 MG tablet Take 2 mg by mouth daily.   Taking   dibucaine (NUPERCAINAL) 1 % OINT Place 1 Application rectally as needed for hemorrhoids.   Taking As Needed   docusate sodium  (COLACE) 50 MG capsule Take 50 mg by mouth daily.   03/25/2024   dorzolamide  (TRUSOPT ) 2 % ophthalmic solution Place 1 drop into both eyes 2 (two) times daily.   03/25/2024   famotidine  (PEPCID ) 40 MG tablet Take 40 mg by mouth at bedtime.   03/25/2024  latanoprost  (XALATAN ) 0.005 % ophthalmic solution 1 drop at bedtime.   03/25/2024   loperamide  (IMODIUM ) 1 MG/5ML solution Take 1 mg by mouth daily as needed for diarrhea or loose stools.   Taking As Needed   losartan -hydrochlorothiazide  (HYZAAR) 50-12.5 MG tablet Take 1 tablet by mouth daily.   03/25/2024   meclizine  (ANTIVERT ) 25 MG tablet Take 25 mg by mouth 3 (three) times daily as needed for dizziness.   03/25/2024   ondansetron  (ZOFRAN ) 4 MG tablet Take 4 mg by mouth every 8 (eight) hours as needed for nausea or vomiting.   03/25/2024   potassium chloride  (KLOR-CON  M) 10 MEQ tablet Take 10 mEq by mouth daily.   03/25/2024   Testosterone 12.5 MG/ACT (1%) GEL Apply 12.5 mg topically daily.   03/25/2024   celecoxib (CELEBREX) 200 MG capsule Take 200 mg by mouth 2 (two) times daily. (Patient not taking: Reported on 08/15/2023)   Not Taking   Cholecalciferol (VITAMIN D) 2000 units CAPS Take by mouth. (Patient not taking: Reported on 08/15/2023)   Not Taking   doxycycline (VIBRA-TABS) 100 MG tablet Take 100 mg by  mouth 2 (two) times daily. (Patient not taking: Reported on 08/15/2023)   Not Taking   Fluticasone Furoate 50 MCG/ACT AEPB Inhale into the lungs. (Patient not taking: Reported on 03/26/2024)   Not Taking   Insulin Pen Needle 31G X 5 MM MISC by Does not apply route. (Patient not taking: Reported on 11/27/2021)      liraglutide (VICTOZA) 18 MG/3ML SOPN Inject 18 mg into the skin daily. (Patient not taking: Reported on 08/15/2023)   Not Taking   meloxicam (MOBIC) 7.5 MG tablet Take 7.5 mg by mouth daily. (Patient not taking: Reported on 08/15/2023)   Not Taking   metoprolol succinate (TOPROL-XL) 25 MG 24 hr tablet Take 25 mg by mouth daily. (Patient not taking: Reported on 03/26/2024)   Not Taking   oxyCODONE -acetaminophen  (PERCOCET/ROXICET) 5-325 MG tablet Take 1 tablet by mouth every 4 (four) hours as needed for severe pain. (Patient not taking: Reported on 08/15/2023) 15 tablet 0 Not Taking   pantoprazole (PROTONIX) 40 MG tablet Take 40 mg by mouth daily. (Patient not taking: Reported on 03/26/2024)   Not Taking   Polyethylene Glycol POWD by Does not apply route. (Patient not taking: Reported on 08/15/2023)   Not Taking   triamcinolone acetonide 40 MG/ML SUSP 40 mg, mupirocin cream 2 % CREA 15 g Apply 1 application topically 3 (three) times daily. (Patient not taking: Reported on 08/15/2023)   Not Taking   Baseline anticoagulation labs: Recent Labs    03/27/24 2057 03/27/24 2104 03/28/24 0424 03/29/24 0331  APTT  --  33  --   --   INR 1.2  --   --   --   HGB  --  12.0 11.2* 10.6*  PLT  --  202 184 184   Goal(s) of therapy: Heparin  level 0.3 - 0.7 units/mL Monitor platelets by anticoagulation protocol: Yes  Monitoring: 0601 0424 HL 0.57, therapeutic x 1 0601 1110 HL 0.73, SUPRAtherapeutic 0601 1756 HL 0.47, therapeutic x 1 0602 0331 HL 0.40, therapeutic x 2   PLAN: Continue heparin  infusion at 1000 units/hour. Recheck HL daily w/ AM labs while therapeutic. Monitor CBC daily while on  heparin  infusion.  Coretta Dexter, PharmD, [facility] 03/29/2024 4:43 AM

## 2024-03-29 NOTE — Plan of Care (Signed)
  Problem: Clinical Measurements: Goal: Diagnostic test results will improve Outcome: Progressing Goal: Cardiovascular complication will be avoided Outcome: Progressing   Problem: Activity: Goal: Risk for activity intolerance will decrease Outcome: Progressing   Problem: Nutrition: Goal: Adequate nutrition will be maintained Outcome: Progressing   Problem: Elimination: Goal: Will not experience complications related to bowel motility Outcome: Progressing   Problem: Pain Managment: Goal: General experience of comfort will improve and/or be controlled Outcome: Progressing   Problem: Safety: Goal: Ability to remain free from injury will improve Outcome: Progressing

## 2024-03-29 NOTE — Progress Notes (Signed)
 Rounding Note   Patient Name: Alyssa Leonard Date of Encounter: 03/29/2024  [facility] Cardiologist: New patient - Dr. Alvenia Aus  Subjective  Patient reports doing well today with improvements in left foot pain. She reports ongoing cough. Denies chest pain and shortness of breath. She did convert from atrial fibrillation/flutter to sinus rhythm yesterday ~1735. Now maintaining sinus rhythm.   Scheduled Meds:  azithromycin  500 mg Oral Daily   colchicine  0.6 mg Oral BID   colestipol   3 g Oral BID   dextromethorphan-guaiFENesin   1 tablet Oral BID   docusate  50 mg Oral Daily   dorzolamide   1 drop Both Eyes BID   famotidine   40 mg Oral QHS   feeding supplement  237 mL Oral BID BM   latanoprost   1 drop Both Eyes QHS   losartan   25 mg Oral Daily   metoprolol tartrate  12.5 mg Oral BID   predniSONE   40 mg Oral Q breakfast   Continuous Infusions:  amiodarone 30 mg/hr (03/28/24 2231)   cefTRIAXone  (ROCEPHIN )  IV 2 g (03/28/24 2136)   heparin  1,000 Units/hr (03/28/24 1204)   vancomycin  1,250 mg (03/29/24 0835)   PRN Meds: acetaminophen , albuterol , diazepam , hydrALAZINE , loperamide  HCl, meclizine , morphine  injection, ondansetron  (ZOFRAN ) IV, oxyCODONE -acetaminophen    Vital Signs  Vitals:   03/28/24 2026 03/28/24 2349 03/29/24 0340 03/29/24 0818  BP: 111/65 112/73 107/79 118/67  Pulse: 71 (!) 59 63 66  Resp: 18 16 16 17   Temp: 97.9 F (36.6 C) 98.1 F (36.7 C) 97.7 F (36.5 C) 97.7 F (36.5 C)  TempSrc: Oral   Oral  SpO2: 100% 100% 100%   Weight:      Height:        Intake/Output Summary (Last 24 hours) at 03/29/2024 0954 Last data filed at 03/29/2024 0457 Gross per 24 hour  Intake 806.82 ml  Output 600 ml  Net 206.82 ml      03/27/2024    8:40 PM 03/26/2024    8:05 PM 12/25/2023    1:29 PM  Last 3 Weights  Weight (lbs) 285 lb 4.4 oz 288 lb 320 lb  Weight (kg) 129.4 kg 130.636 kg 145.151 kg      Telemetry Atrial fibrillation/flutter with conversion to  sinus rhythm ~1735 on 6/1, now maintaining sinus rhythm - Personally Reviewed  Physical Exam  GEN: No acute distress.   Neck: No JVD Cardiac: RRR, no murmurs, rubs, or gallops.  Respiratory: Coarse cough; Clear to auscultation bilaterally. GI: Soft, nontender, non-distended  MS: No edema; No deformity. Neuro:  Nonfocal  Psych: Normal affect   Labs High Sensitivity Troponin:   Recent Labs  Lab 03/27/24 1626 03/27/24 1826  TROPONINIHS 10 11     Chemistry Recent Labs  Lab 03/26/24 2134 03/27/24 0430 03/27/24 2104 03/28/24 0424 03/29/24 0331  NA  --    < > 139 142 138  K  --    < > 3.7 3.5 3.8  CL  --    < > 107 110 109  CO2  --    < > 20* 22 21*  GLUCOSE  --    < > 246* 143* 133*  BUN  --    < > 14 15 24*  CREATININE  --    < > 1.01* 0.93 1.01*  CALCIUM  --    < > 9.4 9.0 8.9  MG 1.5*  --  2.1  --  2.1  PROT  --   --  7.2  --   --  ALBUMIN  --   --  3.4*  --   --   AST  --   --  19  --   --   ALT  --   --  11  --   --   ALKPHOS  --   --  67  --   --   BILITOT  --   --  0.7  --   --   GFRNONAA  --    < > >60 >60 >60  ANIONGAP  --    < > 12 10 8    < > = values in this interval not displayed.    Lipids No results for input(s): "CHOL", "TRIG", "HDL", "LABVLDL", "LDLCALC", "CHOLHDL" in the last 168 hours.  Hematology Recent Labs  Lab 03/27/24 2104 03/28/24 0424 03/29/24 0331  WBC 8.3 9.2 7.8  RBC 3.95 3.81* 3.59*  HGB 12.0 11.2* 10.6*  HCT 34.1* 32.5* 30.7*  MCV 86.3 85.3 85.5  MCH 30.4 29.4 29.5  MCHC 35.2 34.5 34.5  RDW 13.2 13.2 13.4  PLT 202 184 184   Thyroid  Recent Labs  Lab 03/28/24 0420 03/28/24 0424  TSH  --  0.268*  FREET4 1.22*  --     BNPNo results for input(s): "BNP", "PROBNP" in the last 168 hours.  DDimer  Recent Labs  Lab 03/27/24 2104  DDIMER 1.15*     Radiology  Result Date: 03/28/2024 CLINICAL DATA:  Suspected pulmonary embolism. EXAM: CT ANGIOGRAPHY CHEST WITH CONTRAST IMPRESSION: 1. No evidence of arterial occlusion or  embolus to the segmental level. The subsegmental arterial bed is unopacified and not evaluated. 2. Cardiomegaly with IVC and hepatic vein reflux which could be due to right heart dysfunction or tricuspid regurgitation. 3. Stable small pericardial effusion. 4. Aortic and coronary artery atherosclerosis. 5. Stable 4 mm right upper lobe ground-glass nodule and 4 mm medial lingular solid nodule. 6. Diffuse bronchial thickening. Central airways are smaller than previously which could be due to bronchospasm or respiration. 7. Large right renal mass consistent with the known renal cell carcinoma. 8. Masslike fullness approaching 4 cm in the pancreatic uncinate process, partially seen. Findings are unchanged to the extent currently imaged compared with 12/25/2023. 9. Heterogeneous thyroid with bilateral nodules, largest on left is 3.6 cm. This has not been further assessed in our system. The left thyroid mass displaces the trachea right of midline. Nonemergent ultrasound follow-up is recommended unless it was recently performed elsewhere as previously recommended. 10. Hepatic steatosis. Aortic Atherosclerosis (ICD10-I70.0). Electronically Signed: By: Denman Fischer M.D. On: 03/28/2024 00:57   US  Venous Img Lower Bilateral (DVT) Result Date: 03/27/2024 CLINICAL DATA:  Edema and pain. EXAM: BILATERAL IMPRESSION: No evidence of deep venous thrombosis in either lower extremity. Electronically Signed   By: Tyron Gallon M.D.   On: 03/27/2024 21:59    Cardiac Studies  03/28/2024 Echo complete 1. Left ventricular ejection fraction, by estimation, is 50 to 55%. The  left ventricle has low normal function. The left ventricle has no regional  wall motion abnormalities. There is mild concentric left ventricular  hypertrophy. Left ventricular  diastolic function could not be evaluated.   2. Right ventricle is heavily trabeculted. Right ventricular systolic  function is normal. The right ventricular size is mildly enlarged.  There  is normal pulmonary artery systolic pressure.   3. Left atrial size was severely dilated.   4. The mitral valve is normal in structure. Mild mitral valve  regurgitation. No evidence of mitral stenosis.  5. The aortic valve is normal in structure. Aortic valve regurgitation is  not visualized. No aortic stenosis is present.   6. The inferior vena cava is normal in size with greater than 50%  respiratory variability, suggesting right atrial pressure of 3 mmHg.   7. Agitated saline contrast bubble study was negative, with no evidence  of any interatrial shunt.   Patient Profile   68 y.o. female  with a hx of renal cell carcinoma on IPI and an IVF immunotherapy at [facility], hypertension, diet-controlled diabetes, asthma, gout, anxiety, OSA on CPAP, morbid obesity, Mnire's disease admitted 5/30 with left foot pain secondary to cellulitis vs acute gout flare. During admission experienced chest pain and found to be in new onset atrial flutter/fibrillation.   Assessment & Plan   New onset atrial fibrillation/flutter - Initially presented with left foot pain and found to have cellulitis vs acute gout flare. Subsequently experienced chest pain and found to have new onset atrial flutter with HR 120s. Started on IV heparin  and IV amiodarone.  - Troponin negative x 2 - Echo with EF 50-55% - Recommend K > 4 and mag > 2 - TSH/T4 mildly abnormal, thyroid US  ordered - Telemetry shows conversion from atrial fibrillation/flutter to sinus rhythm on 6/1 at 1735. Now maintaining sinus rhythm. - Will discontinue IV amiodarone - Continue metoprolol 12.5 mg twice daily, further titration limited by soft BP - CHA2DS2VASc at least 5 (sex, age x1, HTN, T2DM, PAD) - Recommend transition from IV heparin  to Xarelto (given body size) prior to discharge  Hypertension - PTA amlodipine  and hydrochlorothiazide  held to allow for addition of metoprolol - BP stable - Continue losartan  25 mg daily and metoprolol as  above  For questions or updates, please contact  [facility] Please consult [email or web address] for contact info under     Signed, Brodie Cannon, PA-C  03/29/2024, 9:54 AM

## 2024-03-30 ENCOUNTER — Inpatient Hospital Stay

## 2024-03-30 DIAGNOSIS — I4891 Unspecified atrial fibrillation: Secondary | ICD-10-CM

## 2024-03-30 DIAGNOSIS — M79672 Pain in left foot: Secondary | ICD-10-CM | POA: Diagnosis not present

## 2024-03-30 LAB — CBC
HCT: 31.5 % — ABNORMAL LOW (ref 36.0–46.0)
Hemoglobin: 10.5 g/dL — ABNORMAL LOW (ref 12.0–15.0)
MCH: 29.7 pg (ref 26.0–34.0)
MCHC: 33.3 g/dL (ref 30.0–36.0)
MCV: 89 fL (ref 80.0–100.0)
Platelets: 175 10*3/uL (ref 150–400)
RBC: 3.54 MIL/uL — ABNORMAL LOW (ref 3.87–5.11)
RDW: 13.4 % (ref 11.5–15.5)
WBC: 5.4 10*3/uL (ref 4.0–10.5)
nRBC: 0 % (ref 0.0–0.2)

## 2024-03-30 LAB — BASIC METABOLIC PANEL WITH GFR
Anion gap: 8 (ref 5–15)
BUN: 29 mg/dL — ABNORMAL HIGH (ref 8–23)
CO2: 25 mmol/L (ref 22–32)
Calcium: 9.3 mg/dL (ref 8.9–10.3)
Chloride: 113 mmol/L — ABNORMAL HIGH (ref 98–111)
Creatinine, Ser: 1.11 mg/dL — ABNORMAL HIGH (ref 0.44–1.00)
GFR, Estimated: 54 mL/min — ABNORMAL LOW (ref >60)
Glucose, Bld: 114 mg/dL — ABNORMAL HIGH (ref 70–99)
Potassium: 3.7 mmol/L (ref 3.5–5.1)
Sodium: 146 mmol/L — ABNORMAL HIGH (ref 135–145)

## 2024-03-30 LAB — GLUCOSE, CAPILLARY: Glucose-Capillary: 100 mg/dL — ABNORMAL HIGH (ref 70–99)

## 2024-03-30 MED ORDER — SENNOSIDES-DOCUSATE SODIUM 8.6-50 MG PO TABS
1.0000 | ORAL_TABLET | Freq: Every evening | ORAL | Status: DC | PRN
Start: 2024-03-30 — End: 2024-04-01

## 2024-03-30 MED ORDER — BENZONATATE 100 MG PO CAPS
200.0000 mg | ORAL_CAPSULE | Freq: Three times a day (TID) | ORAL | Status: DC | PRN
  Administered 2024-03-30 – 2024-03-31 (×2): 200 mg via ORAL
  Filled 2024-03-30 (×2): qty 2

## 2024-03-30 MED ORDER — POLYETHYLENE GLYCOL 3350 17 G PO PACK
17.0000 g | PACK | Freq: Every day | ORAL | Status: DC | PRN
Start: 2024-03-30 — End: 2024-04-01

## 2024-03-30 MED ORDER — ORAL CARE MOUTH RINSE: 15.0000 mL | OROMUCOSAL | Status: DC | PRN

## 2024-03-30 MED ORDER — PREDNISONE 20 MG PO TABS
20.0000 mg | ORAL_TABLET | Freq: Every day | ORAL | Status: DC
  Administered 2024-03-31 – 2024-04-01 (×2): 20 mg via ORAL
  Filled 2024-03-30 (×2): qty 1

## 2024-03-30 MED ORDER — HYDROCOD POLI-CHLORPHE POLI ER 10-8 MG/5ML PO SUER
5.0000 mL | Freq: Two times a day (BID) | ORAL | Status: DC | PRN
  Administered 2024-03-31: 5 mL via ORAL
  Filled 2024-03-30 (×2): qty 5

## 2024-03-30 MED ORDER — AZITHROMYCIN 250 MG PO TABS
500.0000 mg | ORAL_TABLET | Freq: Every day | ORAL | Status: AC
  Administered 2024-03-31 – 2024-04-01 (×2): 500 mg via ORAL
  Filled 2024-03-30 (×2): qty 2

## 2024-03-30 MED ORDER — POTASSIUM CHLORIDE CRYS ER 20 MEQ PO TBCR
40.0000 meq | EXTENDED_RELEASE_TABLET | Freq: Once | ORAL | Status: AC
  Administered 2024-03-30: 40 meq via ORAL
  Filled 2024-03-30: qty 2

## 2024-03-30 NOTE — Progress Notes (Addendum)
 Progress Note   Patient: Alyssa Leonard EAV:[id number] DOB: 01-16-1956 DOA: 03/26/2024     2 DOS: the patient was seen and examined on 03/30/2024   Brief hospital course:  "Alyssa Leonard is a 68 y.o. female with medical history significant of renal cell carcinoma on Ipilimumab (IPI) and Nivolumab (NIVO) immunotherapy in [facility], hypertension, diet-controlled diabetes, asthma, gout, anxiety, OSA on CPAP, morbid obesity, Mnire's disease, who presents with left foot pain. ..." See H&P for full HPI on admission & ED course.  Patient was admitted to the hospital, started on IV antibiotics for presumed left foot cellulitis and IV steroids for potential inflammatory process such as gout or pseudogout.  Further hospital course and management as outlined below.   Assessment and Plan:  Left foot pain  Cellulitis of left foot versus Acute Gout Flare: pt feels infection more likely as pain and swelling were quite different from prior gout episodes. Patient p/w whole left foot pain, swelling, erythema and warmth, likely due to cellulitis.  Uric acid level elevated at 10.1. Treating empirically for both cellulitis and gout. --Continue empiric IV vancomycin  and Rocephin  - day 5 / 5 of Abx --On day 5 steroids: Solu-Medrol  40 mg daily >> Prednisone  40 >> 30 mg today & taper --Added colchicine - continue 0.6 mg BID  --Consider allopurinol for prevention of gout flares, after resolution of acute episode --PRN Zofran  for nausea, and Tylenol , morphine  and Percocet for pain --Follow blood cultures  - neg to date --Trend inflammatory markers --PT evaluation  New onset Atrial Flutter with RVR, paroxysmal HR now controlled and transitioned off IV amiodarone 6/2. Echo EF 50-55%, mild LVH, mild MR, severe LA dilation --Cardiology consulted --Continue PO amiodarone  --Started on Xarelto 20 mg daily for stroke prevention --PO metoprolol started 6/1 - continue --Telemetry monitoring --Maintain K>4,  Mg>2 --CHADSVASC of (female, age x 1, HTN, diabetes, PAD) at least 5, will require long term anticoagulation. Transition to Eliquis when clinically appropriate. --Stopped hydrochlorothiazide  and reduce losartan  --PO Lopressor 12.5 mg BID started --If persistent and symptomatic, may require cardioversion --Mgmt of thyroid findings as below  Bilateral Thyroid Nodules and Goiter Hx of left multinodular goiter TSH 0.268, free T4 1.22 -- may have contributed to onset of A-flutter Thyroid U/S done 6/1 - see report.  Left thyroid gland entirely goiterous tissue, multiple other small nodules. --Definitive treatment likely to be RAI or thyroidectomy --Chat message sent to ENT, Dr. Silvestre Drum, for his input --Endocrinology follow up / referral --Per U/S impression -- Nodule # 2 is a small 1.1 cm TI-RADS category 4 nodule in the right mid gland and meets criteria for imaging surveillance.  Recommend follow-up ultrasound in 1 year  Acute bronchitis - based on cough, and CTA chest findings of diffuse bronchial thickening.  Pt notes hx of frequent bronchitis in past, when she smoked, quit 30 yrs ago. --Chest xray to assess if developing PNA --Extend Zithromax out to 5 day course --Mucinex  BID --Tussionex BID PRN  Ambulatory dysfunction - due to left foot pain from above --PT evaluation -- HH recommended --Pain control PRN   Hypertension: --IV hydralazine  as needed --Amlodipine , Hyzaar  Hypernatremia - Na 146 today, one point high --Encourage pt to drink water .  Keep water  pitcher on bedside table --BMP in AM   Hypokalemia: Potassium 3.2 on admission was replaced. --Monitor BMP, replace K as needed  Hypomagnesemia - Mg 1.5 was replaced --Monitor Mg & replace as needed   Asthma, chronic: Stable --Bronchodilators and as needed  Mucinex    Diet controlled diabetes mellitus without complication [facility]): Recent A1c 5.8, well-controlled.  Patient's not taking medications currently.  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 Pt is receiving Ipi/Nivo immunotherapy in [facility].  No surgery was done. --Follow-up with oncologist in [facility]   OSA (obstructive sleep apnea) --CPAP   Morbid obesity with BMI of 50.0-59.9, adult (HCC):Patient is Obesity Class III, with body weight  130.6Kg and BMI  56.25kg/m2.  - Encourage losing weight - Exercise and healthy diet      Subjective: Pt up in recliner this AM on rounds. Reports cough and bronchitis is persistent.  Has chest / rib cage pain with coughing.  No chest pain or palpitations otherwise.  Foot feels better, now tolerating ambulation and requests to be allowed to get up to bathroom independently.   Physical Exam: Vitals:   03/30/24 0138 03/30/24 0427 03/30/24 0803 03/30/24 1204  BP: 123/78 130/84 116/72 127/80  Pulse: 92 67 70 72  Resp: 16 18 18 18   Temp:   (!) 97.4 F (36.3 C) 97.6 F (36.4 C)  TempSrc:      SpO2: 96% 97% 97% 95%  Weight:      Height:       General exam: awake, alert, no acute distress, obese HEENT: moist mucus membranes, hearing grossly normal  Respiratory system: diminished bases otherwise clear, coarse sounding cough Cardiovascular system: normal S1/S2, RRR  Gastrointestinal system: soft, NT, ND Central nervous system: A&O x 3. no gross focal neurologic deficits, normal speech Extremities:  left foot with minimal remaining swelling but warmth tenderness and erythema significantly improved Skin: dry, intact, normal temperature Psychiatry: normal mood, congruent affect, judgement and insight appear normal   Data Reviewed:  Notable labs --   Na 146 Cl 113 Glucose 114 BUN 29 Cr 1.01 >> 1.11 Hbg stable 10.5   Chest xray today -- pending report.  On my review, no obvious infiltrates, ?low lung volumes    Echo 6/1 -- EF 50-55%, mild LVH, mild MR, severe LA dilation,  no interatrial shunt  Thyroid U/S -- diffusely enlarged heterogenous and lobular thyroid with multiple small nodules, left gland is entirely replaced by goiter with no discrete nodule.   Family Communication: None present. Pt updated in detail.   Disposition: Status is: Inpatient Remains inpatient appropriate because: ongoing cough with evaluation underway, closely monitoring HR/rhythm on PO meds.  Likely dc in 24-48 hours.     Planned Discharge Destination: Home with Home Health    Time spent: 45 minutes   Author: Montey Apa, DO 03/30/2024 2:56 PM  For on call review [email or web address].

## 2024-03-30 NOTE — Progress Notes (Signed)
 Occupational Therapy Treatment Patient Details Name: Alyssa Leonard MRN: [id number] DOB: 17-Apr-1956 Today's Date: 03/30/2024   History of present illness Pt is a 68 y.o. female with medical history significant of renal cell carcinoma on immunotherapy at [facility], HTN, diet-controlled diabetes, asthma, gout, anxiety, OSA on CPAP, morbid obesity, Mnire's disease, who presents with left foot pain. MD assessment includes: cellulitis of left foot versus acute gout flare, ambulatory dysfunction, hypomagnesemia, and HTN.   OT comments  Pt seen for OT tx. Pt agreeable and endorsing feeling a bit better today. No pain in L foot with weight bearing noted. Pt completed bed mobility with supv, toilet transfer in bathroom with bariatric BSC over toilet, and set up for standing pericare. Pt endorsed 3/10 perceived rate of exertion after toileting and back to recliner in the room. Pt educated further in activity pacing with use of perceived rate of exertion to help moderate her activity and rest. Pt verbalized understanding. Progressing well towards goals. Continues to benefit.       If plan is discharge home, recommend the following:  A little help with walking and/or transfers;A little help with bathing/dressing/bathroom;Help with stairs or ramp for entrance;Assistance with cooking/housework   Equipment Recommendations  BSC/3in1;Other (comment) (bariatric)       Precautions / Restrictions Precautions Precautions: Fall Recall of Precautions/Restrictions: Intact Restrictions Weight Bearing Restrictions Per Provider Order: No     Mobility Bed Mobility Overal bed mobility: Needs Assistance Bed Mobility: Supine to Sit     Supine to sit: Supervision, Used rails    Transfers Overall transfer level: Needs assistance Equipment used: Rolling walker (2 wheels) Transfers: Sit to/from Stand Sit to Stand: Supervision           General transfer comment: good hand placement     Balance Overall balance  assessment: Needs assistance Sitting-balance support: Feet supported, No upper extremity supported Sitting balance-Leahy Scale: Normal     Standing balance support: During functional activity, Reliant on assistive device for balance, No upper extremity supported Standing balance-Leahy Scale: Fair     ADL either performed or assessed with clinical judgement   ADL Overall ADL's : Needs assistance/impaired     Grooming: Standing;Wash/dry face;Wash/dry hands;Supervision/safety   Lower Body Dressing: Bed level;Maximal assistance Lower Body Dressing Details (indicate cue type and reason): don socks (pt states she doesn't wear socks at home, wears slip on shoes) Toilet Transfer: Comptroller;Ambulation;Rolling walker (2 wheels) Toilet Transfer Details (indicate cue type and reason): placed bari BSC over the toilet for incr height. Toileting- Clothing Manipulation and Hygiene: Sitting/lateral lean;Sit to/from stand;Set up       Functional mobility during ADLs: Rolling walker (2 wheels);Supervision/safety        Cognition Arousal: Alert Behavior During Therapy: WFL for tasks assessed/performed Cognition: No apparent impairments           Exercises Other Exercises Other Exercises: Pt educated further in activity pacing with use of perceived rate of exertion to help moderate her activity and rest          Pertinent Vitals/ Pain       Pain Assessment Pain Assessment: No/denies pain   Frequency  Min 2X/week        Progress Toward Goals  OT Goals(current goals can now be found in the care plan section)  Progress towards OT goals: Progressing toward goals  Acute Rehab OT Goals Patient Stated Goal: return home OT Goal Formulation: With patient Time For Goal Achievement: 04/11/24 Potential to Achieve Goals: Good  AM-PAC OT "6 Clicks" Daily Activity     Outcome Measure   Help from another person eating meals?: None Help from another  person taking care of personal grooming?: None Help from another person toileting, which includes using toliet, bedpan, or urinal?: A Little Help from another person bathing (including washing, rinsing, drying)?: A Little Help from another person to put on and taking off regular upper body clothing?: None Help from another person to put on and taking off regular lower body clothing?: A Little 6 Click Score: 21    End of Session Equipment Utilized During Treatment: Rolling walker (2 wheels)  OT Visit Diagnosis: Other abnormalities of gait and mobility (R26.89);Unsteadiness on feet (R26.81)   Activity Tolerance Patient tolerated treatment well   Patient Left in chair;with call bell/phone within reach;with chair alarm set           Time: [phone] OT Time Calculation (min): 26 min  Charges: OT General Charges $OT Visit: 1 Visit OT Treatments $Self Care/Home Management : 23-37 mins  Berenda Breaker., MPH, MS, OTR/L ascom [phone] 03/30/24, 10:16 AM

## 2024-03-30 NOTE — Progress Notes (Signed)
 Physical Therapy Treatment Patient Details Name: Alyssa Leonard MRN: [id number] DOB: [birth year] Today's Date: 03/30/2024   History of Present Illness Pt is a 68 y.o. female with medical history significant of renal cell carcinoma on immunotherapy at [facility], HTN, diet-controlled diabetes, asthma, gout, anxiety, OSA on CPAP, morbid obesity, Mnire's disease, who presents with left foot pain. MD assessment includes: cellulitis of left foot versus acute gout flare, ambulatory dysfunction, hypomagnesemia, and HTN.    PT Comments  Pt appears to be feeling better today requiring less reliance on AD to ambulate to/from bathroom. Left foot/ankle discomfort improving. She remains SOB upon exertion, however demonstrates a strong cough with SpO2 100% on RA. Will continue to progress acutely. D/C recs for HHPT remain appropriate. Pt has a Bariatric RW at home and states she will not need a BSC at this time.    If plan is discharge home, recommend the following: A little help with walking and/or transfers;A little help with bathing/dressing/bathroom;Assistance with cooking/housework;Assist for transportation   Can travel by private vehicle        Equipment Recommendations  None recommended by PT (Pt has a Bariatric RW at home)    Recommendations for Other Services       Precautions / Restrictions Precautions Precautions: Fall Recall of Precautions/Restrictions: Intact Restrictions Weight Bearing Restrictions Per Provider Order: No     Mobility  Bed Mobility               General bed mobility comments: Pt in recliner pre/post session    Transfers Overall transfer level: Needs assistance Equipment used: Rolling walker (2 wheels) Transfers: Sit to/from Stand Sit to Stand: Supervision           General transfer comment: Improved ability to stand from [facility] and chair this date    Ambulation/Gait Ambulation/Gait assistance: Supervision Gait Distance (Feet): 18 Feet Assistive device:  IV Pole Gait Pattern/deviations: Step-to pattern, Decreased step length - right, Decreased stance time - left, Antalgic Gait velocity: decreased     General Gait Details: Less reliance on AD this date as L foot pain resolves   Stairs Stairs:  (No stairs at home)           Wheelchair Mobility     Tilt Bed    Modified Rankin (Stroke Patients Only)       Balance Overall balance assessment: Needs assistance Sitting-balance support: Feet supported, No upper extremity supported Sitting balance-Leahy Scale: Normal     Standing balance support: Bilateral upper extremity supported, During functional activity, Reliant on assistive device for balance Standing balance-Leahy Scale: Fair Standing balance comment: RW during gait to offload  L foot when painful                            Communication Communication Communication: No apparent difficulties  Cognition Arousal: Alert Behavior During Therapy: WFL for tasks assessed/performed   PT - Cognitive impairments: No apparent impairments                       PT - Cognition Comments:  (Pleasant and motivated) Following commands: Intact      Cueing Cueing Techniques: Verbal cues, Visual cues  Exercises Other Exercises Other Exercises: Pt educated in role of PT, discussed home set up and DME    General Comments General comments (skin integrity, edema, etc.):  (SpO2 100% on RA upon exertion with increased SOB, wet cough.)  Pertinent Vitals/Pain Pain Assessment Pain Assessment: No/denies pain    Home Living                          Prior Function            PT Goals (current goals can now be found in the care plan section) Acute Rehab PT Goals Patient Stated Goal: To walk without pain in my L foot Progress towards PT goals: Progressing toward goals    Frequency    Min 2X/week      PT Plan      Co-evaluation              AM-PAC PT "6 Clicks" Mobility    Outcome Measure  Help needed turning from your back to your side while in a flat bed without using bedrails?: A Little Help needed moving from lying on your back to sitting on the side of a flat bed without using bedrails?: A Little Help needed moving to and from a bed to a chair (including a wheelchair)?: A Little Help needed standing up from a chair using your arms (e.g., wheelchair or bedside chair)?: A Little Help needed to walk in hospital room?: A Little Help needed climbing 3-5 steps with a railing? : A Lot 6 Click Score: 17    End of Session Equipment Utilized During Treatment: Gait belt Activity Tolerance: Patient tolerated treatment well Patient left: in chair;with call bell/phone within reach;with chair alarm set Nurse Communication: Mobility status PT Visit Diagnosis: Pain Pain - Right/Left: Left Pain - part of body: Ankle and joints of foot     Time: 1140-1159 PT Time Calculation (min) (ACUTE ONLY): 19 min  Charges:    $Therapeutic Activity: 8-22 mins PT General Charges $$ ACUTE PT VISIT: 1 Visit                    Melvyn Stagers, PTA  Diona Franklin 03/30/2024, 12:07 PM

## 2024-03-30 NOTE — Consult Note (Signed)
 Pharmacy Antibiotic Note  ASSESSMENT: 68 y.o. female with PMH including morbid obesity, diabetes is presenting with left foot cellulitis. Foot XR shows diffuse soft tissue swelling with degenerative changes, but no evidence of osteomyelitis. Pharmacy has been consulted to manage vancomycin  dosing.  Day # 5 of current Abx regimen. Scr stable at 1.11 (BL 0.95), Patient remains afebrile, Bcx NG x 4 days, and WNC within normal limits.   PLAN:   Patient on ceftriaxone  as well per MD orders (Total 5 days, end date in epic) Continue vancomycin  1250mg  IV q24H thereafter, last dose today 6/3 (end date in epic) eAUC 487, Cmax 31, Cmin 13  Vital signs: Temp: 97.4 F (36.3 C) (06/03 0803) BP: 116/72 (06/03 0803) Pulse Rate: 70 (06/03 0803)  Recent Labs  Lab 03/28/24 0424 03/29/24 0331 03/30/24 0411  WBC 9.2 7.8 5.4  CREATININE 0.93 1.01* 1.11*   Estimated Creatinine Clearance: 60.6 mL/min (A) (by C-G formula based on SCr of 1.11 mg/dL (H)).  Antimicrobials this admission: Ceftriaxone  5/30 >> (6/4) Vancomycin  5/30 >>(6/4)   Microbiology results: Bcx - NGTD  Thank you for allowing pharmacy to be a part of this patient's care.  Kasarah Sitts Rodriguez-Guzman PharmD, BCPS 03/30/2024 8:17 AM

## 2024-03-30 NOTE — Plan of Care (Signed)

## 2024-03-30 NOTE — TOC CM/SW Note (Cosign Needed)
 Patient is not able to walk the distance required to go the bathroom, or he/she is unable to safely negotiate stairs required to access the bathroom.  A 3in1 BSC will alleviate this problem   Due to the patient's body habitus they will require a Bariatric bedside commode along with a Bariatric rolling walker

## 2024-03-30 NOTE — TOC Progression Note (Signed)
 Transition of Care [facility]) - Progression Note    Patient Details  Name: Alyssa Leonard MRN: [id number] Date of Birth: August 30, 1956  [facility]) CM/SW Contact  Odilia Bennett, LCSW Phone Number: 03/30/2024, 1:26 PM  Clinical Narrative:   Per PT, patient does not need DME at discharge.  Expected Discharge Plan and Services                                               Social Determinants of Health (SDOH) Interventions SDOH Screenings   Food Insecurity: No Food Insecurity (03/26/2024)  Housing: Low Risk  (03/26/2024)  Transportation Needs: No Transportation Needs (03/26/2024)  Utilities: Not At Risk (03/26/2024)  Depression (PHQ2-9): Low Risk  (08/15/2023)  Financial Resource Strain: Medium Risk (09/03/2023)   Received from [facility]  Social Connections: Moderately Isolated (03/26/2024)  Tobacco Use: Medium Risk (03/26/2024)  Health Literacy: Medium Risk (09/03/2023)   Received from [facility]    Readmission Risk Interventions     No data to display

## 2024-03-30 NOTE — Care Management Important Message (Signed)
 Important Message  Patient Details  Name: Alyssa Leonard MRN: [id number] Date of Birth: [birth year]   Important Message Given:  Yes - Medicare IM     Anise Kerns 03/30/2024, 1:09 PM

## 2024-03-30 NOTE — Plan of Care (Signed)
  Problem: Education: Goal: Knowledge of General Education information will improve Description: Including pain rating scale, medication(s)/side effects and non-pharmacologic comfort measures Outcome: Progressing   Problem: Clinical Measurements: Goal: Cardiovascular complication will be avoided Outcome: Progressing   Problem: Activity: Goal: Risk for activity intolerance will decrease Outcome: Progressing   Problem: Nutrition: Goal: Adequate nutrition will be maintained Outcome: Progressing   Problem: Elimination: Goal: Will not experience complications related to bowel motility Outcome: Progressing   Problem: Pain Managment: Goal: General experience of comfort will improve and/or be controlled Outcome: Progressing   Problem: Safety: Goal: Ability to remain free from injury will improve Outcome: Progressing

## 2024-03-30 NOTE — Progress Notes (Signed)
 Rounding Note   Patient Name: Alyssa Leonard Date of Encounter: 03/30/2024  [facility] Cardiologist: New patient - Dr. Alvenia Aus  Subjective Patient reports ongoing cough. She reports her foot pain has completely subsided. Denies chest pain and palpitations. She is maintaining sinus rhythm with oral amiodarone.   Scheduled Meds:  amiodarone  400 mg Oral BID   colchicine  0.6 mg Oral BID   colestipol   1 g Oral BID   dextromethorphan-guaiFENesin   1 tablet Oral BID   docusate  50 mg Oral Daily   dorzolamide   1 drop Both Eyes BID   famotidine   40 mg Oral QHS   feeding supplement  237 mL Oral BID BM   latanoprost   1 drop Both Eyes QHS   losartan   25 mg Oral Daily   metoprolol tartrate  12.5 mg Oral BID   polyethylene glycol  17 g Oral Daily   predniSONE   30 mg Oral Q breakfast   rivaroxaban  20 mg Oral Daily   senna-docusate  1 tablet Oral BID   Continuous Infusions:  cefTRIAXone  (ROCEPHIN )  IV 2 g (03/29/24 1530)   vancomycin  1,250 mg (03/30/24 0842)   PRN Meds: acetaminophen , albuterol , benzonatate, diazepam , hydrALAZINE , loperamide  HCl, meclizine , morphine  injection, ondansetron  (ZOFRAN ) IV, mouth rinse, oxyCODONE -acetaminophen    Vital Signs  Vitals:   03/29/24 2022 03/30/24 0138 03/30/24 0427 03/30/24 0803  BP:  123/78 130/84 116/72  Pulse:  92 67 70  Resp: 20 16 18 18   Temp:    (!) 97.4 F (36.3 C)  TempSrc:      SpO2:  96% 97% 97%  Weight:      Height:        Intake/Output Summary (Last 24 hours) at 03/30/2024 0854 Last data filed at 03/30/2024 0446 Gross per 24 hour  Intake 1440 ml  Output 650 ml  Net 790 ml      03/27/2024    8:40 PM 03/26/2024    8:05 PM 12/25/2023    1:29 PM  Last 3 Weights  Weight (lbs) 285 lb 4.4 oz 288 lb 320 lb  Weight (kg) 129.4 kg 130.636 kg 145.151 kg      Telemetry Sinus rhythm - Personally Reviewed  Physical Exam  GEN: No acute distress.   Neck: No JVD Cardiac: RRR, no murmurs, rubs, or gallops.  Respiratory:  Coarse cough; Clear to auscultation bilaterally. GI: Soft, nontender, non-distended  MS: No edema; No deformity. Neuro:  Nonfocal  Psych: Normal affect   Labs High Sensitivity Troponin:   Recent Labs  Lab 03/27/24 1626 03/27/24 1826  TROPONINIHS 10 11     Chemistry Recent Labs  Lab 03/26/24 2134 03/27/24 0430 03/27/24 2104 03/28/24 0424 03/29/24 0331 03/30/24 0411  NA  --    < > 139 142 138 146*  K  --    < > 3.7 3.5 3.8 3.7  CL  --    < > 107 110 109 113*  CO2  --    < > 20* 22 21* 25  GLUCOSE  --    < > 246* 143* 133* 114*  BUN  --    < > 14 15 24* 29*  CREATININE  --    < > 1.01* 0.93 1.01* 1.11*  CALCIUM  --    < > 9.4 9.0 8.9 9.3  MG 1.5*  --  2.1  --  2.1  --   PROT  --   --  7.2  --   --   --  ALBUMIN  --   --  3.4*  --   --   --   AST  --   --  19  --   --   --   ALT  --   --  11  --   --   --   ALKPHOS  --   --  67  --   --   --   BILITOT  --   --  0.7  --   --   --   GFRNONAA  --    < > >60 >60 >60 54*  ANIONGAP  --    < > 12 10 8 8    < > = values in this interval not displayed.    Lipids No results for input(s): "CHOL", "TRIG", "HDL", "LABVLDL", "LDLCALC", "CHOLHDL" in the last 168 hours.  Hematology Recent Labs  Lab 03/28/24 0424 03/29/24 0331 03/30/24 0411  WBC 9.2 7.8 5.4  RBC 3.81* 3.59* 3.54*  HGB 11.2* 10.6* 10.5*  HCT 32.5* 30.7* 31.5*  MCV 85.3 85.5 89.0  MCH 29.4 29.5 29.7  MCHC 34.5 34.5 33.3  RDW 13.2 13.4 13.4  PLT 184 184 175   Thyroid  Recent Labs  Lab 03/28/24 0420 03/28/24 0424  TSH  --  0.268*  FREET4 1.22*  --     BNPNo results for input(s): "BNP", "PROBNP" in the last 168 hours.  DDimer  Recent Labs  Lab 03/27/24 2104  DDIMER 1.15*     Radiology  Result Date: 03/28/2024 CLINICAL DATA:  Suspected pulmonary embolism. EXAM: CT ANGIOGRAPHY CHEST WITH CONTRAST IMPRESSION: 1. No evidence of arterial occlusion or embolus to the segmental level. The subsegmental arterial bed is unopacified and not evaluated. 2.  Cardiomegaly with IVC and hepatic vein reflux which could be due to right heart dysfunction or tricuspid regurgitation. 3. Stable small pericardial effusion. 4. Aortic and coronary artery atherosclerosis. 5. Stable 4 mm right upper lobe ground-glass nodule and 4 mm medial lingular solid nodule. 6. Diffuse bronchial thickening. Central airways are smaller than previously which could be due to bronchospasm or respiration. 7. Large right renal mass consistent with the known renal cell carcinoma. 8. Masslike fullness approaching 4 cm in the pancreatic uncinate process, partially seen. Findings are unchanged to the extent currently imaged compared with 12/25/2023. 9. Heterogeneous thyroid with bilateral nodules, largest on left is 3.6 cm. This has not been further assessed in our system. The left thyroid mass displaces the trachea right of midline. Nonemergent ultrasound follow-up is recommended unless it was recently performed elsewhere as previously recommended. 10. Hepatic steatosis. Aortic Atherosclerosis (ICD10-I70.0). Electronically Signed: By: Denman Fischer M.D. On: 03/28/2024 00:57   US  Venous Img Lower Bilateral (DVT) Result Date: 03/27/2024 CLINICAL DATA:  Edema and pain. EXAM: BILATERAL IMPRESSION: No evidence of deep venous thrombosis in either lower extremity. Electronically Signed   By: Tyron Gallon M.D.   On: 03/27/2024 21:59    Cardiac Studies  03/28/2024 Echo complete 1. Left ventricular ejection fraction, by estimation, is 50 to 55%. The  left ventricle has low normal function. The left ventricle has no regional  wall motion abnormalities. There is mild concentric left ventricular  hypertrophy. Left ventricular  diastolic function could not be evaluated.   2. Right ventricle is heavily trabeculted. Right ventricular systolic  function is normal. The right ventricular size is mildly enlarged. There  is normal pulmonary artery systolic pressure.   3. Left atrial size was severely dilated.    4. The  mitral valve is normal in structure. Mild mitral valve  regurgitation. No evidence of mitral stenosis.   5. The aortic valve is normal in structure. Aortic valve regurgitation is  not visualized. No aortic stenosis is present.   6. The inferior vena cava is normal in size with greater than 50%  respiratory variability, suggesting right atrial pressure of 3 mmHg.   7. Agitated saline contrast bubble study was negative, with no evidence  of any interatrial shunt.   Patient Profile   68 y.o. female  with a hx of renal cell carcinoma on IPI and an IVF immunotherapy at [facility], hypertension, diet-controlled diabetes, asthma, gout, anxiety, OSA on CPAP, morbid obesity, Mnire's disease admitted 5/30 with left foot pain secondary to cellulitis vs acute gout flare. During admission experienced chest pain and found to be in new onset atrial flutter/fibrillation.   Assessment & Plan   New onset atrial fibrillation/flutter - Initially presented with left foot pain and found to have cellulitis vs acute gout flare. Subsequently experienced chest pain and found to have new onset atrial flutter with HR 120s. Started on IV heparin  and IV amiodarone. Converted to sinus rhythm on 6/1. - Troponin negative x 2 - Echo with EF 50-55% - Recommend K > 4 and mag > 2 - TSH/T4 mildly abnormal, thyroid US  ordered - Continue amiodarone 400 mg twice daily, followed by 200 mg twice daily, followed by 200 mg daily thereafter - Outpatient follow up with EP for further discussion regarding antiarrhythmic medication recommendations - Continue metoprolol 12.5 mg twice daily - CHA2DS2VASc at least 5 (sex, age x1, HTN, T2DM, PAD) - Continue Xarelto 20 mg daily  Hypertension - PTA amlodipine  and hydrochlorothiazide  held to allow for addition of metoprolol - BP stable - Continue losartan  25 mg daily and metoprolol as above  For questions or updates, please contact [facility] Please consult [email or web address]  for contact info under     Signed, Brodie Cannon, PA-C  03/30/2024, 8:54 AM

## 2024-03-31 DIAGNOSIS — M79672 Pain in left foot: Secondary | ICD-10-CM | POA: Diagnosis not present

## 2024-03-31 LAB — BASIC METABOLIC PANEL WITH GFR
Anion gap: 9 (ref 5–15)
BUN: 23 mg/dL (ref 8–23)
CO2: 20 mmol/L — ABNORMAL LOW (ref 22–32)
Calcium: 9.1 mg/dL (ref 8.9–10.3)
Chloride: 111 mmol/L (ref 98–111)
Creatinine, Ser: 0.96 mg/dL (ref 0.44–1.00)
GFR, Estimated: >60 mL/min (ref >60)
Glucose, Bld: 100 mg/dL — ABNORMAL HIGH (ref 70–99)
Potassium: 3.6 mmol/L (ref 3.5–5.1)
Sodium: 140 mmol/L (ref 135–145)

## 2024-03-31 LAB — CULTURE, BLOOD (ROUTINE X 2)
Culture: NO GROWTH
Culture: NO GROWTH
Special Requests: ADEQUATE

## 2024-03-31 LAB — MAGNESIUM: Magnesium: 1.8 mg/dL (ref 1.7–2.4)

## 2024-03-31 LAB — GLUCOSE, CAPILLARY
Glucose-Capillary: 110 mg/dL — ABNORMAL HIGH (ref 70–99)
Glucose-Capillary: 129 mg/dL — ABNORMAL HIGH (ref 70–99)

## 2024-03-31 MED ORDER — ADULT MULTIVITAMIN W/MINERALS CH
1.0000 | ORAL_TABLET | Freq: Every day | ORAL | Status: DC
  Administered 2024-03-31 – 2024-04-01 (×2): 1 via ORAL
  Filled 2024-03-31 (×2): qty 1

## 2024-03-31 NOTE — Progress Notes (Signed)
 Nutrition Follow-up  DOCUMENTATION CODES:   Morbid obesity  INTERVENTION:   -MVI with minerals daily -Ensure Plus High Protein po BID, each supplement provides 350 kcal and 20 grams of protein.  -Liberalize diet to regular for widest variety of meal selections  NUTRITION DIAGNOSIS:   Increased nutrient needs related to cancer and cancer related treatments as evidenced by estimated needs.  GOAL:   Patient will meet greater than or equal to 90% of their needs  MONITOR:   PO intake, Supplement acceptance  REASON FOR ASSESSMENT:   Malnutrition Screening Tool    ASSESSMENT:   Pt with medical history significant of renal cell carcinoma on Ipilimumab (IPI) and Nivolumab (NIVO) immunotherapy in [facility], hypertension, diet-controlled diabetes, asthma, gout, anxiety, OSA on CPAP, morbid obesity, Mnire's disease, who presents with left foot pain.  Pt admitted with lt foot pain secondary to cellulitis vs acute gout flare.   Reviewed I/O's: +720 ml x 24 hours and +4.2 L since admission   Spoke with pt at bedside, who was pleasant and in good spirits today. Pt shares that she is feeling better today and consuming corn and [place] for lunch. Pt reports poor appetite due to GERD, but Ensure helps coat the stomach (pt shares that her GERD medications was recently switched per her oncologist and current medications works, but is not as effective as previous medications). Noted meal completions 50-100%.  Pt reports she generally consumes 2 meals per day. Pt has skipped breakfast "my entire life". She consumes a sandwich or leftovers for lunch and a meat, starch, and vegetable for dinner. Pt reports she has been trying to eat more vegetables and eat less fried foods by using her air fryer at home.   Reviewed wt hx; pt has experienced a 10.9% wt loss over the past 3 months, which is significant for time frame. Pt shares that she lost about 20# over the past 2-3 months, related to decreased oral  intake.   Discussed importance of good meal and supplement intake to promote healing. Pt amenable to continue Ensure supplements. She is happy she is on a regular diet for wider variety of meal selections.   Medications reviewed and include colchicine, mucinex , colace, pepcid , cozaar , and prednisone .   Labs reviewed: CBGS: 100-129 (inpatient orders for glycemic control are none).    NUTRITION - FOCUSED PHYSICAL EXAM:  Flowsheet Row Most Recent Value  Orbital Region No depletion  Upper Arm Region No depletion  Thoracic and Lumbar Region No depletion  Buccal Region No depletion  Temple Region No depletion  Clavicle Bone Region No depletion  Clavicle and Acromion Bone Region No depletion  Scapular Bone Region No depletion  Dorsal Hand No depletion  Patellar Region No depletion  Anterior Thigh Region No depletion  Posterior Calf Region No depletion  Edema (RD Assessment) Mild  Hair Reviewed  Eyes Reviewed  Mouth Reviewed  Skin Reviewed  Nails Reviewed       Diet Order:   Diet Order             Diet regular Room service appropriate? Yes; Fluid consistency: Thin  Diet effective now                   EDUCATION NEEDS:   Education needs have been addressed  Skin:  Skin Assessment: Reviewed RN Assessment  Last BM:  03/31/24  Height:   Ht Readings from Last 1 Encounters:  03/26/24 5' (1.524 m)    Weight:   Wt Readings from  Last 1 Encounters:  03/27/24 129.4 kg    Ideal Body Weight:  45.5 kg  BMI:  Body mass index is 55.71 kg/m.  Estimated Nutritional Needs:   Kcal:  1600-1800  Protein:  80-95 grams  Fluid:  1.6-1.8 L    Herschel Lords, RD, LDN, CDCES Registered Dietitian III Certified Diabetes Care and Education Specialist If unable to reach this RD, please use "RD Inpatient" group chat on secure chat between hours of 8am-4 pm daily

## 2024-03-31 NOTE — Plan of Care (Signed)
  Problem: Education: Goal: Knowledge of General Education information will improve Description: Including pain rating scale, medication(s)/side effects and non-pharmacologic comfort measures Outcome: Progressing   Problem: Health Behavior/Discharge Planning: Goal: Ability to manage health-related needs will improve Outcome: Progressing   Problem: Clinical Measurements: Goal: Ability to maintain clinical measurements within normal limits will improve Outcome: Progressing Goal: Diagnostic test results will improve Outcome: Progressing Goal: Cardiovascular complication will be avoided Outcome: Progressing   Problem: Coping: Goal: Level of anxiety will decrease Outcome: Progressing   Problem: Pain Managment: Goal: General experience of comfort will improve and/or be controlled Outcome: Progressing   Problem: Safety: Goal: Ability to remain free from injury will improve Outcome: Progressing

## 2024-03-31 NOTE — Progress Notes (Signed)
 PROGRESS NOTE    [name]  UKG:[id number] DOB: September 05, 1956 DOA: 03/26/2024 PCP: Benuel Brazier, MD  [id number]  LOS: 3 days   Brief hospital course:   Assessment & Plan: Alyssa Leonard is a 68 y.o. female with medical history significant of renal cell carcinoma on Ipilimumab (IPI) and Nivolumab (NIVO) immunotherapy in [facility], hypertension, diet-controlled diabetes, asthma, gout, anxiety, OSA on CPAP, morbid obesity, Mnire's disease, who presents with left foot pain.    Left foot pain  Cellulitis of left foot versus Acute Gout Flare:  Patient p/w whole left foot pain, swelling, erythema and warmth, likely due to cellulitis.  Uric acid level elevated at 10.1. Treating empirically for both cellulitis and gout. --completed 5 days of empiric IV vancomycin  and Rocephin  --cont prednisone  with taper --cont colchicine   New onset Atrial Flutter with RVR, paroxysmal HR now controlled and transitioned off IV amiodarone 6/2. Echo EF 50-55%, mild LVH, mild MR, severe LA dilation --Cardiology consulted --Continue PO amiodarone and Lopressor --cont Xarelto --f/u EP clinic in 2 weeks  Bilateral Thyroid Nodules and Goiter Hx of left multinodular goiter TSH 0.268, free T4 1.22 -- may have contributed to onset of A-flutter Thyroid U/S done 6/1 - see report.  Left thyroid gland entirely goiterous tissue, multiple other small nodules. --Definitive treatment likely to be RAI or thyroidectomy --Chat message sent to ENT, Dr. Silvestre Drum, for his input --Endocrinology follow up / referral --Per U/S impression -- Nodule # 2 is a small 1.1 cm TI-RADS category 4 nodule in the right mid gland and meets criteria for imaging surveillance.  Recommend follow-up ultrasound in 1 year   Acute bronchitis  - based on cough, and CTA chest findings of diffuse bronchial thickening.  Pt notes hx of frequent bronchitis in past, when she smoked, quit 30 yrs ago. --cont azithro --Mucinex  BID --Tussionex BID  PRN   Ambulatory dysfunction - due to left foot pain from above --PT evaluation -- HH recommended --Pain control PRN   HTN --cont losartan  and Lopressor   Hypernatremia  --1 episode of 146, not clinically significant   Hypokalemia:  Hypomagnesemia --monitor and supplement PRN   Asthma, chronic: Stable --Bronchodilators and as needed Mucinex    Diet controlled diabetes mellitus without complication (HCC):  Recent A1c 5.8, well-controlled.  Patient's not taking medications currently.  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 Pt is receiving Ipi/Nivo immunotherapy in [facility].  No surgery was done. --Follow-up with oncologist in [facility]   OSA (obstructive sleep apnea) --CPAP   Morbid obesity with BMI of 50.0-59.9, adult (HCC):Patient is Obesity Class III, with body weight  130.6Kg and BMI  56.25kg/m2.  - Encourage losing weight - Exercise and healthy diet   DVT prophylaxis: [id number]  Code Status: Full code  Family Communication:  Level of care: Progressive Dispo:   The patient is from: home Anticipated d/c is to: home Anticipated d/c date is: tomorrow   Subjective and Interval History:  Pt reported left foot pain much improved.   Objective: Vitals:   03/31/24 0341 03/31/24 0901 03/31/24 1159 03/31/24 1701  BP: (!) 144/88 118/68 136/81 (!) 151/92  Pulse: 74 81 76 75  Resp: 18     Temp: 98.6 F (37 C) 98.5 F (36.9 C) 98.4 F (36.9 C) 98.1 F (36.7 C)  TempSrc:   Oral   SpO2: 100% 95% 99% 100%  Weight:  Height:        Intake/Output Summary (Last 24 hours) at 03/31/2024 1953 Last data filed at 03/31/2024 1900 Gross per 24 hour  Intake 720 ml  Output --  Net 720 ml   Filed Weights   03/26/24 2005 03/27/24 2040  Weight: 130.6 kg 129.4 kg    Examination:   Constitutional: NAD, AAOx3 HEENT: conjunctivae and lids  normal, EOMI CV: No cyanosis.   RESP: normal respiratory effort, on RA Extremities: left foot no erythema or edema SKIN: warm, dry Neuro: II - XII grossly intact.   Psych: Normal mood and affect.  Appropriate judgement and reason   Data Reviewed: I have personally reviewed labs and imaging studies  Time spent: 50 minutes  Garrison Kanner, MD [facility] If 7PM-7AM, please contact night-coverage 03/31/2024, 7:53 PM

## 2024-03-31 NOTE — Progress Notes (Signed)
 Occupational Therapy Treatment Patient Details Name: Alyssa Leonard MRN: [id number] DOB: 1956-10-24 Today's Date: 03/31/2024   History of present illness Pt is a 68 y.o. female with medical history significant of renal cell carcinoma on immunotherapy at [facility], HTN, diet-controlled diabetes, asthma, gout, anxiety, OSA on CPAP, morbid obesity, Mnire's disease, who presents with left foot pain. MD assessment includes: cellulitis of left foot versus acute gout flare, ambulatory dysfunction, hypomagnesemia, and HTN.   OT comments  Pt making good progress towards goals, notes decreased foot discomfort while walking today. Pt mod independent for bed mobility, t/f bathroom using BRW with supervision, and functional mobility in hallway 50 ft, occasional cuing for seated/standing rest breaks due to increased work of breathing. VSS. Pt continues to benefit from skilled OT services, will continue to follow acutely.       If plan is discharge home, recommend the following:  A little help with walking and/or transfers;A little help with bathing/dressing/bathroom;Help with stairs or ramp for entrance;Assistance with cooking/housework   Equipment Recommendations  None recommended by OT       Precautions / Restrictions Precautions Precautions: Fall Recall of Precautions/Restrictions: Intact Restrictions Weight Bearing Restrictions Per Provider Order: No       Mobility Bed Mobility Overal bed mobility: Modified Independent             General bed mobility comments: use of bed rails    Transfers Overall transfer level: Needs assistance Equipment used: Rolling walker (2 wheels) Transfers: Sit to/from Stand Sit to Stand: Supervision           General transfer comment: no physical assist needed     Balance Overall balance assessment: Needs assistance Sitting-balance support: Feet supported, No upper extremity supported Sitting balance-Leahy Scale: Normal     Standing balance support:  Bilateral upper extremity supported, During functional activity, Reliant on assistive device for balance Standing balance-Leahy Scale: Fair                             ADL either performed or assessed with clinical judgement   ADL Overall ADL's : Needs assistance/impaired     Grooming: Wash/dry hands;Standing;Supervision/safety Grooming Details (indicate cue type and reason): distant supervision in bathroom             Lower Body Dressing: Sit to/from stand;Supervision/safety Lower Body Dressing Details (indicate cue type and reason): dons slip on shoes Toilet Transfer: Supervision/safety;BSC/3in1;Regular Toilet;Ambulation;Rolling walker (2 wheels) Toilet Transfer Details (indicate cue type and reason): placed bari BSC over the toilet for incr height. Toileting- Clothing Manipulation and Hygiene: Sitting/lateral lean;Sit to/from stand;Supervision/safety Toileting - Clothing Manipulation Details (indicate cue type and reason): distant supervision     Functional mobility during ADLs: Rolling walker (2 wheels);Supervision/safety General ADL Comments: T/f bathroom using BRW; functional mobility in hallway SUPERVISION 50 ft. Short bouts of coughing and wheezing noted.     Communication Communication Communication: No apparent difficulties   Cognition Arousal: Alert Behavior During Therapy: WFL for tasks assessed/performed Cognition: No apparent impairments                               Following commands: Intact        Cueing   Cueing Techniques: Verbal cues, Visual cues        General Comments On RA, SpO2 98%, HR up to 100 with ambulation    Pertinent Vitals/ Pain  Pain Assessment Pain Assessment: No/denies pain         Frequency  Min 2X/week        Progress Toward Goals  OT Goals(current goals can now be found in the care plan section)  Progress towards OT goals: Progressing toward goals  Acute Rehab OT Goals OT Goal  Formulation: With patient Time For Goal Achievement: 04/11/24 Potential to Achieve Goals: Good  Plan         AM-PAC OT "6 Clicks" Daily Activity     Outcome Measure   Help from another person eating meals?: None Help from another person taking care of personal grooming?: None Help from another person toileting, which includes using toliet, bedpan, or urinal?: A Little Help from another person bathing (including washing, rinsing, drying)?: A Little Help from another person to put on and taking off regular upper body clothing?: None Help from another person to put on and taking off regular lower body clothing?: A Little 6 Click Score: 21    End of Session Equipment Utilized During Treatment: Rolling walker (2 wheels)  OT Visit Diagnosis: Other abnormalities of gait and mobility (R26.89);Unsteadiness on feet (R26.81)   Activity Tolerance Patient tolerated treatment well   Patient Left in bed;with call bell/phone within reach   Nurse Communication Other (comment) (VSS, pt seated EOB (pt stated alarm has been off and she has been IND in room))        Time: 4098-1191 OT Time Calculation (min): 14 min  Charges: OT General Charges $OT Visit: 1 Visit OT Treatments $Self Care/Home Management : 8-22 mins  Loveda Colaizzi L. Donye Campanelli, OTR/L  03/31/24, 4:00 PM

## 2024-04-01 ENCOUNTER — Telehealth (HOSPITAL_COMMUNITY): Payer: Self-pay | Admitting: Pharmacy Technician

## 2024-04-01 ENCOUNTER — Other Ambulatory Visit (HOSPITAL_COMMUNITY): Payer: Self-pay

## 2024-04-01 DIAGNOSIS — M79672 Pain in left foot: Secondary | ICD-10-CM | POA: Diagnosis not present

## 2024-04-01 LAB — GLUCOSE, CAPILLARY: Glucose-Capillary: 103 mg/dL — ABNORMAL HIGH (ref 70–99)

## 2024-04-01 MED ORDER — ADULT MULTIVITAMIN W/MINERALS CH
1.0000 | ORAL_TABLET | Freq: Every day | ORAL | Status: DC
Start: 2024-04-01 — End: 2024-07-01

## 2024-04-01 MED ORDER — AMIODARONE HCL 200 MG PO TABS: ORAL_TABLET | ORAL | 1 refills | Status: DC

## 2024-04-01 MED ORDER — RIVAROXABAN 20 MG PO TABS
20.0000 mg | ORAL_TABLET | Freq: Every day | ORAL | 2 refills | Status: DC
Start: 2024-04-01 — End: 2024-04-20

## 2024-04-01 MED ORDER — METOPROLOL TARTRATE 25 MG PO TABS: 12.5000 mg | ORAL_TABLET | Freq: Two times a day (BID) | ORAL | 2 refills | Status: DC

## 2024-04-01 MED ORDER — DIAZEPAM 2 MG PO TABS: 2.0000 mg | ORAL_TABLET | Freq: Two times a day (BID) | ORAL | Status: DC | PRN

## 2024-04-01 MED ORDER — PREDNISONE 10 MG PO TABS: ORAL_TABLET | ORAL | 0 refills | Status: DC

## 2024-04-01 MED ORDER — LOSARTAN POTASSIUM 25 MG PO TABS: 25.0000 mg | ORAL_TABLET | Freq: Every day | ORAL | 2 refills | Status: DC

## 2024-04-01 NOTE — Plan of Care (Signed)

## 2024-04-01 NOTE — Discharge Summary (Signed)
 Physician Discharge Summary   Alyssa Leonard  female DOB: 1956-09-14  JYN:[id number]  PCP: Benuel Brazier, MD  Admit date: 03/26/2024 Discharge date: 04/01/2024  Admitted From: home Disposition:  home Home Health: Yes CODE STATUS: Full code  Discharge Instructions     Ambulatory referral to ENT   Complete by: As directed    Goiter   Diet - low sodium heart healthy   Complete by: As directed    Discharge instructions   Complete by: As directed    Cardiology has started you on some new medications.  Please take them as prescribed.  You are prescribed prednisone  taper for your gout.  You will need to follow up with ENT and endocrinology for your Goiter. [facility] Course:  For full details, please see H&P, progress notes, consult notes and ancillary notes.  Briefly,  Alyssa Leonard is a 68 y.o. female with medical history significant of renal cell carcinoma on Ipilimumab (IPI) and Nivolumab (NIVO) immunotherapy in [facility], hypertension, diet-controlled diabetes, asthma, gout, OSA on CPAP, morbid obesity, Mnire's disease, who presented with left foot pain.    Left foot pain  Cellulitis of left foot versus Acute Gout Flare:  Patient p/w whole left foot pain, swelling, erythema and warmth, likely due to cellulitis.  Uric acid level elevated at 10.1. Treated empirically for both cellulitis and gout. --completed 5 days of empiric IV vancomycin  and Rocephin  --started on prednisone , discharged to finish taper. --received colchicine  during hospitalization.     New onset Atrial Flutter with RVR, paroxysmal HR controlled and transitioned off IV amiodarone  6/2. Echo EF 50-55%, mild LVH, mild MR, severe LA dilation --Cardiology consulted --Continue PO amiodarone  (to complete loading at home) --switched from home Toprol  25 mg daily to Lopressor  12.5 mg BID --cont Xarelto  --f/u EP clinic in 2 weeks   Bilateral Thyroid  Nodules and Goiter Hx of left multinodular  goiter TSH 0.268, free T4 1.22 -- may have contributed to onset of A-flutter Thyroid  U/S done 6/1 showed Left thyroid  gland entirely goiterous tissue, multiple other small nodules. --Definitive treatment likely to be RAI or thyroidectomy --Per U/S impression -- Nodule # 2 is a small 1.1 cm TI-RADS category 4 nodule in the right mid gland and meets criteria for imaging surveillance.  Recommend follow-up ultrasound in 1 year.   Acute bronchitis  - based on cough, and CTA chest findings of diffuse bronchial thickening.  Pt notes hx of frequent bronchitis in past, when she smoked, quit 30 yrs ago. --cont azithro --Mucinex  BID --Tussionex BID PRN   Ambulatory dysfunction - due to left foot pain from above --PT evaluation -- HH recommended --Pain control PRN   HTN --cont losartan  and Lopressor    Hypernatremia  --1 episode of 146, not clinically significant   Hypokalemia:  Hypomagnesemia --monitor and supplement PRN   Asthma, chronic: Stable --Bronchodilators and as needed Mucinex    Diet controlled diabetes mellitus without complication (HCC):  Recent A1c 5.8, well-controlled.  Patient's not taking medications currently.  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 Pt is receiving Ipi/Nivo immunotherapy in [facility].  No surgery was done. --Follow-up with oncologist in [facility]   OSA (obstructive sleep apnea) --CPAP   Morbid obesity with BMI of 50.0-59.9, adult (HCC):Patient is Obesity Class III, with body weight  130.6Kg and BMI  56.25kg/m2.  - Encourage losing weight -  Exercise and healthy diet   Faxed referral to Dr. Lorelei Rogers  Cardio to make f/u with EP  Unless noted above, medications under "STOP" list are ones pt was not taking PTA.  Discharge Diagnoses:  Principal Problem:   Left foot pain Active Problems:   Cellulitis of left foot    Hypertension   Hypokalemia   Asthma, chronic   Diabetes mellitus without complication (HCC)   Renal cell carcinoma (HCC)   OSA (obstructive sleep apnea)   Morbid obesity with BMI of 50.0-59.9, adult (HCC)   Atrial flutter with rapid ventricular response (HCC)   30 Day Unplanned Readmission Risk Score    Flowsheet Row ED to Hosp-Admission (Current) from 03/26/2024 in [facility]  30 Day Unplanned Readmission Risk Score (%) 16.48 Filed at 04/01/2024 0801       This score is the patient's risk of an unplanned readmission within 30 days of being discharged (0 -100%). The score is based on dignosis, age, lab data, medications, orders, and past utilization.   Low:  0-14.9   Medium: 15-21.9   High: 22-29.9   Extreme: 30 and above         Discharge Instructions:  Allergies as of 04/01/2024       Reactions   Latex Other (See Comments), Dermatitis, Hives, Swelling   unknown Other reaction(s): Other (See Comments)  unknown Other reaction(s): Other (See Comments) unknown    unknown   Aspirin Other (See Comments), Nausea And Vomiting   Makes stomach burn Other reaction(s): Other (See Comments)  Makes stomach burn Makes stomach burn    Other reaction(s): Other (See Comments) Makes stomach burn        Medication List     STOP taking these medications    amLODipine  5 MG tablet Commonly known as: NORVASC    celecoxib 200 MG capsule Commonly known as: CELEBREX   doxycycline 100 MG tablet Commonly known as: VIBRA-TABS   Fluticasone Furoate 50 MCG/ACT Aepb   Insulin Pen Needle 31G X 5 MM Misc   liraglutide 18 MG/3ML Sopn Commonly known as: VICTOZA   losartan -hydrochlorothiazide  50-12.5 MG tablet Commonly known as: HYZAAR   meloxicam 7.5 MG tablet Commonly known as: MOBIC   metoprolol  succinate 25 MG 24 hr tablet Commonly known as: TOPROL -XL   oxyCODONE -acetaminophen  5-325 MG tablet Commonly known as: PERCOCET/ROXICET   pantoprazole 40 MG  tablet Commonly known as: PROTONIX   Polyethylene Glycol Powd   potassium chloride  10 MEQ tablet Commonly known as: KLOR-CON  M   triamcinolone acetonide 40 MG/ML SUSP 40 mg, mupirocin cream 2 % CREA 15 g   Vitamin D 50 MCG (2000 UT) Caps       TAKE these medications    acetaminophen  500 MG tablet Commonly known as: TYLENOL  Take 1,000 mg by mouth every 6 (six) hours as needed for mild pain (pain score 1-3).   albuterol  108 (90 Base) MCG/ACT inhaler Commonly known as: VENTOLIN  HFA Inhale 2 puffs into the lungs every 6 (six) hours as needed for wheezing or shortness of breath.   amiodarone  200 MG tablet Commonly known as: PACERONE  Take 400 mg (2 tabs) twice daily from 6/5 to 6/6, then 200 mg (1 tab) twice daily from 6/7 to 6/13, then 200 mg (1 tab) daily from 6/14 onwards.   Brinzolamide -Brimonidine  1-0.2 % Susp Apply to eye 2 (two) times daily.   colestipol  1 g tablet Commonly known as: COLESTID  Take 3 g by mouth 2 (two) times daily.   diazepam  2  MG tablet Commonly known as: VALIUM  Take 1 tablet (2 mg total) by mouth 2 (two) times daily as needed for anxiety. Home med. What changed:  when to take this reasons to take this additional instructions   dibucaine 1 % Oint Commonly known as: NUPERCAINAL Place 1 Application rectally as needed for hemorrhoids.   docusate sodium  50 MG capsule Commonly known as: COLACE Take 50 mg by mouth daily.   dorzolamide  2 % ophthalmic solution Commonly known as: TRUSOPT  Place 1 drop into both eyes 2 (two) times daily.   famotidine  40 MG tablet Commonly known as: PEPCID  Take 40 mg by mouth at bedtime.   latanoprost  0.005 % ophthalmic solution Commonly known as: XALATAN  1 drop at bedtime.   loperamide  1 MG/5ML solution Commonly known as: IMODIUM  Take 1 mg by mouth daily as needed for diarrhea or loose stools.   losartan  25 MG tablet Commonly known as: COZAAR  Take 1 tablet (25 mg total) by mouth daily.   meclizine  25 MG  tablet Commonly known as: ANTIVERT  Take 25 mg by mouth 3 (three) times daily as needed for dizziness.   metoprolol  tartrate 25 MG tablet Commonly known as: LOPRESSOR  Take 0.5 tablets (12.5 mg total) by mouth 2 (two) times daily.   multivitamin with minerals Tabs tablet Take 1 tablet by mouth daily.   ondansetron  4 MG tablet Commonly known as: ZOFRAN  Take 4 mg by mouth every 8 (eight) hours as needed for nausea or vomiting.   predniSONE  10 MG tablet Commonly known as: DELTASONE  Take 20 mg (2 tabs) on 6/6, then 10 mg (1 tab) from 6/7 to 6/9, then done.   rivaroxaban  20 MG Tabs tablet Commonly known as: XARELTO  Take 1 tablet (20 mg total) by mouth daily.   Testosterone 12.5 MG/ACT (1%) Gel Apply 12.5 mg topically daily.         Follow-up Information     [facility].   Specialty: Home Health Services Why: New name [facility]. They will follow up with you for your home health therapy needs.        Lesly Raspberry, MD Follow up in 2 week(s).   Specialty: Otolaryngology Contact information: [address] [place] [address] [phone]         Entzminger, Iantha Mainland, MD Follow up in 1 week(s).   Specialty: Internal Medicine Contact information: [address] [place] [address] [phone]                 Allergies  Allergen Reactions   Latex Other (See Comments), Dermatitis, Hives and Swelling    unknown  Other reaction(s): Other (See Comments)  unknown  Other reaction(s): Other (See Comments) unknown    unknown   Aspirin Other (See Comments) and Nausea And Vomiting    Makes stomach burn  Other reaction(s): Other (See Comments)  Makes stomach burn  Makes stomach burn    Other reaction(s): Other (See Comments) Makes stomach burn     The results of significant diagnostics from this hospitalization (including imaging, microbiology, ancillary and laboratory) are listed  below for reference.   Consultations:   Procedures/Studies: DG Chest Port 1 View Result Date: 03/30/2024 CLINICAL DATA:  Cough. EXAM: PORTABLE CHEST 1 VIEW COMPARISON:  February 13, 2021. FINDINGS: The heart size and mediastinal contours are within normal limits. Both lungs are clear. The visualized skeletal structures are unremarkable. IMPRESSION: No active disease. Electronically Signed   By: Irving Mantle.D.  On: 03/30/2024 17:22   US  THYROID  Result Date: 03/29/2024 CLINICAL DATA:  Incidental on CT. EXAM: THYROID  ULTRASOUND TECHNIQUE: Ultrasound examination of the thyroid  gland and adjacent soft tissues was performed. COMPARISON:  None Available. FINDINGS: Parenchymal Echotexture: Moderately heterogenous Isthmus: 0.8 cm Right lobe: 5.0 x 2.1 x 1.9 cm Left lobe: 6.8 x 3.8 x 3.2 cm _________________________________________________________ Estimated total number of nodules >/= 1 cm: 4 Number of spongiform nodules >/=  2 cm not described below (TR1): 0 Number of mixed cystic and solid nodules >/= 1.5 cm not described below (TR2): 0 _________________________________________________________ Enlarged, heterogeneous and diffusely multinodular thyroid  gland. Nearly innumerable small cysts and nodules replaced the vast majority of gland. Nodule # 1: Mixed cystic and solid nodule in the right upper gland measures up to 1.6 cm. TI-RADS category 2. This nodule does NOT meet TI-RADS criteria for biopsy or dedicated follow-up. Nodule # 2: Hypoechoic solid nodule in the right mid gland the shin 0.1 x 1.0 x 1.1 cm. Findings are consistent with TI-RADS category 4. *Given size (>/= 1 - 1.4 cm) and appearance, a follow-up ultrasound in 1 year should be considered based on TI-RADS criteria. Nodule # 3 and # 4: Vague heterogeneous goitrous change throughout the entirety of the left gland yielding a somewhat pseudo nodular appearance. No definite discrete thyroid  nodules are evident which can be separated from the background  parenchyma. IMPRESSION: 1. Diffusely enlarged, heterogeneous and lobular thyroid  gland with multiple small nodules and a regions of pseudo nodularity. The left gland is entirely replaced by lobular goitrous tissue without discrete nodule. 2. Nodule # 2 is a small 1.1 cm TI-RADS category 4 nodule in the right mid gland and meets criteria for imaging surveillance. Recommend follow-up ultrasound in 1 year. The above is in keeping with the ACR TI-RADS recommendations - J Am Coll Radiol 2017;14:587-595. Electronically Signed   By: Fernando Hoyer M.D.   On: 03/29/2024 09:56   ECHOCARDIOGRAM COMPLETE BUBBLE STUDY Result Date: 03/28/2024    ECHOCARDIOGRAM REPORT   Patient Name:   MAYBELLINE KOLARIK Date of Exam: 03/28/2024 Medical Rec #:  [id number]      Height:       60.0 in Accession #:    [id number]     Weight:       285.3 lb Date of Birth:  August 01, 1956      BSA:          2.171 m Patient Age:    68 years       BP:           109/82 mmHg Patient Gender: F              HR:           98 bpm. Exam Location:  [facility] Procedure: 2D Echo, Cardiac Doppler, Color Doppler and Saline Contrast Bubble            Study (Both Spectral and Color Flow Doppler were utilized during            procedure). Indications:     MURMUR 785.2 / R01.1  History:         Patient has no prior history of Echocardiogram examinations.  Sonographer:     Jane Meager RDCS Referring Phys:  [id number] BRENDA MORRISON Diagnosing Phys: Maudine Sos MD  Sonographer Comments: Patient is obese. Image acquisition challenging due to patient body habitus and Image acquisition challenging due to respiratory motion. IMPRESSIONS  1. Left ventricular ejection fraction, by estimation, is 50  to 55%. The left ventricle has low normal function. The left ventricle has no regional wall motion abnormalities. There is mild concentric left ventricular hypertrophy. Left ventricular diastolic function could not be evaluated.  2. Right ventricle is heavily trabeculted. Right ventricular  systolic function is normal. The right ventricular size is mildly enlarged. There is normal pulmonary artery systolic pressure.  3. Left atrial size was severely dilated.  4. The mitral valve is normal in structure. Mild mitral valve regurgitation. No evidence of mitral stenosis.  5. The aortic valve is normal in structure. Aortic valve regurgitation is not visualized. No aortic stenosis is present.  6. The inferior vena cava is normal in size with greater than 50% respiratory variability, suggesting right atrial pressure of 3 mmHg.  7. Agitated saline contrast bubble study was negative, with no evidence of any interatrial shunt. FINDINGS  Left Ventricle: Left ventricular ejection fraction, by estimation, is 50 to 55%. The left ventricle has low normal function. The left ventricle has no regional wall motion abnormalities. The left ventricular internal cavity size was normal in size. There is mild concentric left ventricular hypertrophy. Left ventricular diastolic function could not be evaluated due to atrial fibrillation. Left ventricular diastolic function could not be evaluated. Right Ventricle: Right ventricle is heavily trabeculted. The right ventricular size is mildly enlarged. No increase in right ventricular wall thickness. Right ventricular systolic function is normal. There is normal pulmonary artery systolic pressure. The tricuspid regurgitant velocity is 1.67 m/s, and with an assumed right atrial pressure of 3 mmHg, the estimated right ventricular systolic pressure is 14.2 mmHg. Left Atrium: Left atrial size was severely dilated. Right Atrium: Right atrial size was normal in size. Pericardium: Trivial pericardial effusion is present. Mitral Valve: The mitral valve is normal in structure. Mild mitral valve regurgitation. No evidence of mitral valve stenosis. Tricuspid Valve: The tricuspid valve is normal in structure. Tricuspid valve regurgitation is trivial. No evidence of tricuspid stenosis. Aortic  Valve: The aortic valve is normal in structure. Aortic valve regurgitation is not visualized. No aortic stenosis is present. Aortic valve peak gradient measures 4.7 mmHg. Pulmonic Valve: The pulmonic valve was normal in structure. Pulmonic valve regurgitation is not visualized. No evidence of pulmonic stenosis. Aorta: The aortic root is normal in size and structure. Venous: The inferior vena cava is normal in size with greater than 50% respiratory variability, suggesting right atrial pressure of 3 mmHg. IAS/Shunts: No atrial level shunt detected by color flow Doppler. Agitated saline contrast was given intravenously to evaluate for intracardiac shunting. Agitated saline contrast bubble study was negative, with no evidence of any interatrial shunt.  LEFT VENTRICLE PLAX 2D LVIDd:         4.10 cm LV PW:         1.20 cm LV IVS:        1.10 cm LVOT diam:     2.00 cm LV SV:         46 LV SV Index:   21 LVOT Area:     3.14 cm  RIGHT VENTRICLE RV Basal diam:  3.50 cm LEFT ATRIUM           Index LA diam:      3.80 cm 1.75 cm/m LA Vol (A2C): 86.8 ml 39.99 ml/m LA Vol (A4C): 76.5 ml 35.24 ml/m  AORTIC VALVE                 PULMONIC VALVE AV Area (Vmax): 2.53 cm     PV Vmax:  0.83 m/s AV Vmax:        108.00 cm/s  PV Peak grad:   2.7 mmHg AV Peak Grad:   4.7 mmHg     RVOT Peak grad: 1 mmHg LVOT Vmax:      87.00 cm/s LVOT Vmean:     57.300 cm/s LVOT VTI:       0.148 m  AORTA Ao Root diam: 2.90 cm Ao Asc diam:  3.10 cm TRICUSPID VALVE TR Peak grad:   11.2 mmHg TR Vmax:        167.00 cm/s  SHUNTS Systemic VTI:  0.15 m Systemic Diam: 2.00 cm Maudine Sos MD Electronically signed by Maudine Sos MD Signature Date/Time: 03/28/2024/1:10:05 PM    Final    CT Angio Chest Pulmonary Embolism (PE) W or WO Contrast Addendum Date: 03/28/2024 ADDENDUM REPORT: 03/28/2024 01:03 ADDENDUM: Clinical history is left lower extremity pain, suspected DVT and pulmonary embolism. Electronically Signed   By: Denman Fischer M.D.   On:  03/28/2024 01:03   Result Date: 03/28/2024 CLINICAL DATA:  Suspected pulmonary embolism. EXAM: CT ANGIOGRAPHY CHEST WITH CONTRAST TECHNIQUE: Multidetector CT imaging of the chest was performed using the standard protocol during bolus administration of intravenous contrast. Multiplanar CT image reconstructions and MIPs were obtained to evaluate the vascular anatomy. RADIATION DOSE REDUCTION: This exam was performed according to the departmental dose-optimization program which includes automated exposure control, adjustment of the mA and/or kV according to patient size and/or use of iterative reconstruction technique. CONTRAST:  OMNIPAQUE  IOHEXOL  350 MG/ML SOLN COMPARISON:  Chest CT without contrast 08/18/2023, last chest series was PA Lat 02/13/2021. FINDINGS: Cardiovascular: There is mild cardiomegaly with a left chamber predominance. There is IVC and hepatic vein reflux which could be due to right heart dysfunction or tricuspid regurgitation. The pulmonary trunk slightly prominent 2.9 cm suggesting mild arterial hypertension, unchanged. Arterial opacification is diagnostic to the segmental level. No embolus is seen to the segmental arteries. The subsegmental arterial bed unopacified and not evaluated. Small pericardial effusion is again noted anteriorly, seen previously. Mild scattered single-vessel calcific plaque noted LAD coronary artery. There is aortic atherosclerosis without aneurysm, dissection or stenosis. The great vessels are widely patent with 2 vessel normal variant aortic arch. Mediastinum/Nodes: Heterogeneous thyroid  with bilateral nodules, largest on left is 3.6 cm. This has not notably changed but has not been further assessed in our system. The left thyroid  mass displaces the trachea right of midline. Nonemergent ultrasound follow-up is recommended unless it was recently performed elsewhere as previously recommended. Stable slightly prominent right paratracheal space nodes to 1 cm in short  axis. This is unchanged. No other intrathoracic adenopathy is seen. Axillary spaces are clear. Negative esophagus and thoracic trachea, main bronchi. Lungs/Pleura: Central airways are smaller than previously which could be due to bronchospasm or respiration. There is diffuse bronchial thickening. There is a stable 4 mm pleural-based ground-glass nodule in the right upper lobe on 6:51. There is a stable 4 mm medial lingular solid nodule on 6:42. The lungs are otherwise clear. There is no consolidation or effusion. Upper Abdomen: Comparison is made with CT abdomen and pelvis with contrast 12/25/2023. A large right renal mass partially visible consistent with the known renal cell carcinoma. Masslike fullness approaching 4 cm in the pancreatic uncinate process is also partially seen. Findings are unchanged to the extent currently imaged. Again noted is a 3.1 cm Bosniak 1 cyst in the anterior left kidney. There are no acute upper abdominal findings. There are postsurgical changes  of the stomach, moderate hepatic steatosis. Musculoskeletal: Moderate thoracic spondylosis. No regional bone metastasis is seen. The visualized chest wall is unremarkable. Portions could not be included in the field due to habitus. Review of the MIP images confirms the above findings. IMPRESSION: 1. No evidence of arterial occlusion or embolus to the segmental level. The subsegmental arterial bed is unopacified and not evaluated. 2. Cardiomegaly with IVC and hepatic vein reflux which could be due to right heart dysfunction or tricuspid regurgitation. 3. Stable small pericardial effusion. 4. Aortic and coronary artery atherosclerosis. 5. Stable 4 mm right upper lobe ground-glass nodule and 4 mm medial lingular solid nodule. 6. Diffuse bronchial thickening. Central airways are smaller than previously which could be due to bronchospasm or respiration. 7. Large right renal mass consistent with the known renal cell carcinoma. 8. Masslike fullness  approaching 4 cm in the pancreatic uncinate process, partially seen. Findings are unchanged to the extent currently imaged compared with 12/25/2023. 9. Heterogeneous thyroid  with bilateral nodules, largest on left is 3.6 cm. This has not been further assessed in our system. The left thyroid  mass displaces the trachea right of midline. Nonemergent ultrasound follow-up is recommended unless it was recently performed elsewhere as previously recommended. 10. Hepatic steatosis. Aortic Atherosclerosis (ICD10-I70.0). Electronically Signed: By: Denman Fischer M.D. On: 03/28/2024 00:57   US  Venous Img Lower Bilateral (DVT) Result Date: 03/27/2024 CLINICAL DATA:  Edema and pain. EXAM: BILATERAL LOWER EXTREMITY VENOUS DOPPLER ULTRASOUND TECHNIQUE: Gray-scale sonography with graded compression, as well as color Doppler and duplex ultrasound were performed to evaluate the lower extremity deep venous systems from the level of the common femoral vein and including the common femoral, femoral, profunda femoral, popliteal and calf veins including the posterior tibial, peroneal and gastrocnemius veins when visible. The superficial great saphenous vein was also interrogated. Spectral Doppler was utilized to evaluate flow at rest and with distal augmentation maneuvers in the common femoral, femoral and popliteal veins. COMPARISON:  None Available. FINDINGS: RIGHT LOWER EXTREMITY Common Femoral Vein: No evidence of thrombus. Normal compressibility, respiratory phasicity and response to augmentation. Saphenofemoral Junction: No evidence of thrombus. Normal compressibility and flow on color Doppler imaging. Profunda Femoral Vein: No evidence of thrombus. Normal compressibility and flow on color Doppler imaging. Femoral Vein: No evidence of thrombus. Normal compressibility, respiratory phasicity and response to augmentation. Popliteal Vein: No evidence of thrombus. Normal compressibility, respiratory phasicity and response to  augmentation. Calf Veins: No evidence of thrombus. Normal compressibility and flow on color Doppler imaging. Superficial Great Saphenous Vein: No evidence of thrombus. Normal compressibility. Venous Reflux:  None. Other Findings:  None. LEFT LOWER EXTREMITY Common Femoral Vein: No evidence of thrombus. Normal compressibility, respiratory phasicity and response to augmentation. Saphenofemoral Junction: No evidence of thrombus. Normal compressibility and flow on color Doppler imaging. Profunda Femoral Vein: No evidence of thrombus. Normal compressibility and flow on color Doppler imaging. Femoral Vein: No evidence of thrombus. Normal compressibility, respiratory phasicity and response to augmentation. Popliteal Vein: No evidence of thrombus. Normal compressibility, respiratory phasicity and response to augmentation. Calf Veins: No evidence of thrombus. Normal compressibility and flow on color Doppler imaging. Superficial Great Saphenous Vein: No evidence of thrombus. Normal compressibility. Venous Reflux:  None. Other Findings:  None. IMPRESSION: No evidence of deep venous thrombosis in either lower extremity. Electronically Signed   By: Tyron Gallon M.D.   On: 03/27/2024 21:59   DG Foot Complete Left Result Date: 03/26/2024 CLINICAL DATA:  Foot pain EXAM: LEFT FOOT - COMPLETE 3+ VIEW  COMPARISON:  None Available. FINDINGS: No fracture or malalignment. Dorsal degenerative osteophytes. Small plantar calcaneal spur. Diffuse soft tissue swelling. Mild degenerative changes at the first MTP joint. IMPRESSION: No acute osseous abnormality. Diffuse soft tissue swelling. Degenerative changes. Electronically Signed   By: Esmeralda Hedge M.D.   On: 03/26/2024 19:45      Labs: BNP (last 3 results) No results for input(s): "BNP" in the last 8760 hours. Basic Metabolic Panel: Recent Labs  Lab 03/26/24 2134 03/27/24 0430 03/27/24 2104 03/28/24 0424 03/29/24 0331 03/30/24 0411 03/31/24 0446  NA  --    < > 139 142  138 146* 140  K  --    < > 3.7 3.5 3.8 3.7 3.6  CL  --    < > 107 110 109 113* 111  CO2  --    < > 20* 22 21* 25 20*  GLUCOSE  --    < > 246* 143* 133* 114* 100*  BUN  --    < > 14 15 24* 29* 23  CREATININE  --    < > 1.01* 0.93 1.01* 1.11* 0.96  CALCIUM  --    < > 9.4 9.0 8.9 9.3 9.1  MG 1.5*  --  2.1  --  2.1  --  1.8  PHOS 2.5  --   --   --   --   --   --    < > = values in this interval not displayed.   Liver Function Tests: Recent Labs  Lab 03/27/24 2104  AST 19  ALT 11  ALKPHOS 67  BILITOT 0.7  PROT 7.2  ALBUMIN 3.4*   No results for input(s): "LIPASE", "AMYLASE" in the last 168 hours. No results for input(s): "AMMONIA" in the last 168 hours. CBC: Recent Labs  Lab 03/27/24 0430 03/27/24 2104 03/28/24 0424 03/29/24 0331 03/30/24 0411  WBC 5.4 8.3 9.2 7.8 5.4  NEUTROABS  --  6.8  --   --   --   HGB 10.8* 12.0 11.2* 10.6* 10.5*  HCT 31.3* 34.1* 32.5* 30.7* 31.5*  MCV 85.5 86.3 85.3 85.5 89.0  PLT 167 202 184 184 175   Cardiac Enzymes: No results for input(s): "CKTOTAL", "CKMB", "CKMBINDEX", "TROPONINI" in the last 168 hours. BNP: Invalid input(s): "POCBNP" CBG: Recent Labs  Lab 03/29/24 0816 03/30/24 0801 03/31/24 0902 03/31/24 1159 04/01/24 0814  GLUCAP 114* 100* 110* 129* 103*   D-Dimer No results for input(s): "DDIMER" in the last 72 hours. Hgb A1c No results for input(s): "HGBA1C" in the last 72 hours. Lipid Profile No results for input(s): "CHOL", "HDL", "LDLCALC", "TRIG", "CHOLHDL", "LDLDIRECT" in the last 72 hours. Thyroid  function studies No results for input(s): "TSH", "T4TOTAL", "T3FREE", "THYROIDAB" in the last 72 hours.  Invalid input(s): "FREET3" Anemia work up No results for input(s): "VITAMINB12", "FOLATE", "FERRITIN", "TIBC", "IRON", "RETICCTPCT" in the last 72 hours. Urinalysis    Component Value Date/Time   COLORURINE YELLOW (A) 12/25/2023 1459   APPEARANCEUR CLEAR (A) 12/25/2023 1459   LABSPEC 1.011 12/25/2023 1459    PHURINE 5.0 12/25/2023 1459   GLUCOSEU NEGATIVE 12/25/2023 1459   HGBUR NEGATIVE 12/25/2023 1459   BILIRUBINUR NEGATIVE 12/25/2023 1459   KETONESUR NEGATIVE 12/25/2023 1459   PROTEINUR NEGATIVE 12/25/2023 1459   NITRITE NEGATIVE 12/25/2023 1459   LEUKOCYTESUR NEGATIVE 12/25/2023 1459   Sepsis Labs Recent Labs  Lab 03/27/24 2104 03/28/24 0424 03/29/24 0331 03/30/24 0411  WBC 8.3 9.2 7.8 5.4   Microbiology Recent Results (from the past  240 hours)  Blood culture (routine x 2)     Status: None   Collection Time: 03/26/24  9:34 PM   Specimen: BLOOD  Result Value Ref Range Status   Specimen Description BLOOD BLOOD LEFT ARM  Final   Special Requests   Final    BOTTLES DRAWN AEROBIC AND ANAEROBIC Blood Culture adequate volume   Culture   Final    NO GROWTH 5 DAYS Performed at [facility], [address] [place], [place] [address]    Report Status 03/31/2024 FINAL  Final  Blood culture (routine x 2)     Status: None   Collection Time: 03/26/24  9:35 PM   Specimen: BLOOD  Result Value Ref Range Status   Specimen Description BLOOD BLOOD RIGHT HAND  Final   Special Requests   Final    BOTTLES DRAWN AEROBIC AND ANAEROBIC Blood Culture results may not be optimal due to an inadequate volume of blood received in culture bottles   Culture   Final    NO GROWTH 5 DAYS Performed at [facility], [address] [place], [place] [address]    Report Status 03/31/2024 FINAL  Final     Total time spend on discharging this patient, including the last patient exam, discussing the hospital stay, instructions for ongoing care as it relates to all pertinent caregivers, as well as preparing the medical discharge records, prescriptions, and/or referrals as applicable, is *** minutes.    Garrison Kanner, MD  [facility] 04/01/2024, 10:27 AM

## 2024-04-01 NOTE — Telephone Encounter (Signed)
 Patient Product/process development scientist completed.    The patient is insured through [facility] Patient has Medicare and is not eligible for a copay card, but may be able to apply for patient assistance or Medicare RX Payment Plan (Patient Must reach out to their plan, if eligible for payment plan), if available.    Ran test claim for Eliquis 5 mg and the current 30 day co-pay is $172.23 due to a deductible.  Will be $47.00 once deductible is met.   This test claim was processed through [facility]- copay amounts may vary at other pharmacies due to pharmacy/plan contracts, or as the patient moves through the different stages of their insurance plan.     Morgan Arab, CPHT Pharmacy Technician III Certified Patient Advocate [facility] Patient Advocate Team Direct Number: [phone]  Fax: [phone]

## 2024-04-01 NOTE — TOC Transition Note (Signed)
 Transition of Care [facility]) - Discharge Note   Patient Details  Name: Alyssa Leonard MRN: [id number] Date of Birth: [birth year]  Transition of Care [facility]) CM/SW Contact:  Odilia Bennett, LCSW Phone Number: 04/01/2024, 10:39 AM   Clinical Narrative:  Patient has orders to discharge home today. [facility] liaison is aware. No further concerns. CSW signing off.   Final next level of care: Home w Home Health Services Barriers to Discharge: Barriers Resolved   Patient Goals and CMS Choice            Discharge Placement                Patient to be transferred to facility by: Significant other   Patient and family notified of of transfer: 04/01/24  Discharge Plan and Services Additional resources added to the After Visit Summary for                            [facility] Arranged: PT, OT HH Agency: [facility] Date HH Agency Contacted: 04/01/24   Representative spoke with at [facility] Agency: Shaun  Social Drivers of Health (SDOH) Interventions SDOH Screenings   Food Insecurity: No Food Insecurity (03/26/2024)  Housing: Low Risk  (03/26/2024)  Transportation Needs: No Transportation Needs (03/26/2024)  Utilities: Not At Risk (03/26/2024)  Depression (PHQ2-9): Low Risk  (08/15/2023)  Financial Resource Strain: Medium Risk (09/03/2023)   Received from [facility]  Social Connections: Moderately Isolated (03/26/2024)  Tobacco Use: Medium Risk (03/26/2024)  Health Literacy: Medium Risk (09/03/2023)   Received from [facility]     Readmission Risk Interventions     No data to display

## 2024-04-19 ENCOUNTER — Encounter: Payer: Self-pay | Admitting: Cardiology

## 2024-04-19 NOTE — Progress Notes (Unsigned)
 Electrophysiology Clinic Note    Date:  04/20/2024  Patient ID:  Alyssa Leonard, Alyssa Leonard 06-24-1956, MRN [id number] PCP:  [name] LABOR, MD  Cardiologist:  Deatrice Cage, MD Electrophysiologist: None   Discussed the use of AI scribe software for clinical note transcription with the patient, who gave verbal consent to proceed.   Patient Profile    Chief Complaint: hospital follow-up  History of Present Illness: Alyssa Leonard is a 68 y.o. female with PMH notable for AFib, HTN, T2DM, renal cell carcinoma, OSA on CPAP; seen today for post hospital follow up.    Admitted 5/30-04/01/2024 for cellulitis vs gout flare, she received treatment for both. During hospitalization, she developed AFib w RVR and was started on IV amiodarone . She was converted to PO amio prior to discharge along with xarelto  for stroke ppx.  On follow-up today, she had a brief episode of palpitations over the weekend that lasted about 10 minutes. No chest pain, chest pressure, SOB during the episode. She has completed the PO amiodarone  load and is currently on 200mg  daily.  She has had several episodes of diarrhea with stomach burning and thought the xarelto  was the cause and has stopped it. She has also run out of her colestid , is being used to treat her diarrhea and is unable to afford refill of it at >200/month.  She denies bleeding concerns when she was taking xarelto . She has seen both oncologist and PCP since hospitalization, both are aware of her being on amiodarone .   She uses CPAP nightly   she denies chest pain, dyspnea, PND, orthopnea, dizziness, syncope, edema, weight gain, or early satiety.      Arrhythmia/Device History Amiodarone        ROS:  Please see the history of present illness. All other systems are reviewed and otherwise negative.    Physical Exam    VS:  BP 110/80 (BP Location: Left Arm, Patient Position: Sitting, Cuff Size: Large)   Pulse 77   Ht 5' (1.524 m)   Wt 278 lb 8  oz (126.3 kg)   SpO2 95%   BMI 54.39 kg/m  BMI: Body mass index is 54.39 kg/m.      Wt Readings from Last 3 Encounters:  04/20/24 278 lb 8 oz (126.3 kg)  03/27/24 285 lb 4.4 oz (129.4 kg)  12/25/23 (!) 320 lb (145.2 kg)     GEN- The patient is well appearing, alert and oriented x 3 today.   Lungs- Clear to ausculation bilaterally, normal work of breathing.  Heart- Regular rate and rhythm, no murmurs, rubs or gallops Extremities- 1+ L>R peripheral edema, warm, dry    Studies Reviewed   Previous EP, cardiology notes.    EKG is ordered. Personal review of EKG from today shows:    EKG Interpretation Date/Time:  Tuesday April 20 2024 09:59:21 EDT Ventricular Rate:  77 PR Interval:  170 QRS Duration:  86 QT Interval:  398 QTC Calculation: 450 R Axis:   4  Text Interpretation: Sinus rhythm with sinus arrhythmia with occasional Premature ventricular complexes Minimal voltage criteria for LVH, may be normal variant ( R in aVL ) Confirmed by Sharunda Salmon [phone]) on 04/20/2024 10:01:04 AM    TTE bubble study, 03/28/2024  1. Left ventricular ejection fraction, by estimation, is 50 to 55%. The left ventricle has low normal function. The left ventricle has no regional wall motion abnormalities. There is mild concentric left ventricular hypertrophy. Left ventricular diastolic function could not be evaluated.  2. Right ventricle is heavily trabeculted. Right ventricular systolic function is normal. The right ventricular size is mildly enlarged. There is normal pulmonary artery systolic pressure.   3. Left atrial size was severely dilated.   4. The mitral valve is normal in structure. Mild mitral valve regurgitation. No evidence of mitral stenosis.   5. The aortic valve is normal in structure. Aortic valve regurgitation is not visualized. No aortic stenosis is present.   6. The inferior vena cava is normal in size with greater than 50% respiratory variability, suggesting right atrial  pressure of 3 mmHg.   7. Agitated saline contrast bubble study was negative, with no evidence of any interatrial shunt.    Assessment and Plan     #) Parox atrial flutter #) amiodarone  monitoring Recently diagnosed with AFlutter iso gout vs cellulitis flare. She was initiated on amiodarone  and has maintained sinus rhythm since Recent LFT, thyroid  labs stable via care everywhere Planning for thyroid  u/s in near future Do not believe she is currently ablation candidate with BMI > 50, but will defer final decision to MD   #) Hypercoag d/t parox afib CHA2DS2-VASc Score = at least 5 [CHF History: 0, HTN History: 1, Diabetes History: 1, Stroke History: 0, Vascular Disease History: 1, Age Score: 1, Gender Score: 1].  Therefore, the patient's annual risk of stroke is 7.2 %.    Stroke ppx - 20mg  xarelto  daily, appropriately dosed No bleeding concerns Encouraged her to resume xarelto  and discuss elevated stroke risk    #) OSA Encouraged nightly use   #) Obesity We discussed link between obesity and atrial arrhythmias She has lost considerable weight in the last 4 months, encouraged regular physical activity        Current medicines are reviewed at length with the patient today.   The patient does not have concerns regarding her medicines.  The following changes were made today:  none  Labs/ tests ordered today include:  Orders Placed This Encounter  Procedures   EKG 12-Lead     Disposition: Follow up with Dr. Kennyth or EP APP 1-2 months , prefer MD to establish care with EP   Signed, Chantal Needle, NP  04/20/24  4:24 PM  Electrophysiology [facility]

## 2024-04-20 ENCOUNTER — Telehealth (HOSPITAL_COMMUNITY): Payer: Self-pay | Admitting: Pharmacy Technician

## 2024-04-20 ENCOUNTER — Encounter: Payer: Self-pay | Admitting: Cardiology

## 2024-04-20 ENCOUNTER — Ambulatory Visit: Attending: Cardiology | Admitting: Cardiology

## 2024-04-20 ENCOUNTER — Other Ambulatory Visit: Payer: Self-pay

## 2024-04-20 VITALS — BP 110/80 | HR 77 | Ht 60.0 in | Wt 278.5 lb

## 2024-04-20 DIAGNOSIS — G4733 Obstructive sleep apnea (adult) (pediatric): Secondary | ICD-10-CM | POA: Diagnosis not present

## 2024-04-20 DIAGNOSIS — I4892 Unspecified atrial flutter: Secondary | ICD-10-CM

## 2024-04-20 DIAGNOSIS — Z79899 Other long term (current) drug therapy: Secondary | ICD-10-CM

## 2024-04-20 DIAGNOSIS — Z5181 Encounter for therapeutic drug level monitoring: Secondary | ICD-10-CM | POA: Diagnosis not present

## 2024-04-20 DIAGNOSIS — D6869 Other thrombophilia: Secondary | ICD-10-CM | POA: Diagnosis not present

## 2024-04-20 DIAGNOSIS — Z6841 Body Mass Index (BMI) 40.0 and over, adult: Secondary | ICD-10-CM

## 2024-04-20 MED ORDER — RIVAROXABAN 20 MG PO TABS
20.0000 mg | ORAL_TABLET | Freq: Every day | ORAL | 3 refills | Status: DC
  Filled 2024-04-20: qty 90, 90d supply, fill #0
  Filled 2024-06-30: qty 30, 30d supply, fill #0

## 2024-04-20 MED ORDER — AMIODARONE HCL 200 MG PO TABS
200.0000 mg | ORAL_TABLET | Freq: Every day | ORAL | 3 refills | Status: DC
  Filled 2024-04-20: qty 90, 90d supply, fill #0

## 2024-04-20 NOTE — Patient Instructions (Addendum)
 Medication Instructions:  Your physician has recommended you make the following change in your medication:   Refills have been sent in for Xarelto  & Amiodarone   [facility] [phone] Building [address] [place] [address]  *If you need a refill on your cardiac medications before your next appointment, please call your pharmacy*  Lab Work: None  If you have labs (blood work) drawn today and your tests are completely normal, you will receive your results only by: MyChart Message (if you have MyChart) OR A paper copy in the mail If you have any lab test that is abnormal or we need to change your treatment, we will call you to review the results.  Testing/Procedures: None  Follow-Up: At [facility], you and your health needs are our priority.  As part of our continuing mission to provide you with exceptional heart care, our providers are all part of one team.  This team includes your primary Cardiologist (physician) and Advanced Practice Providers or APPs (Physician Assistants and Nurse Practitioners) who all work together to provide you with the care you need, when you need it.  Your next appointment:   4-6 week(s)  Provider:   Dr. Fonda Kitty

## 2024-04-20 NOTE — Telephone Encounter (Signed)
 Patient Product/process development scientist completed.    The patient is insured through [facility]. Patient has Medicare and is not eligible for a copay card, but may be able to apply for patient assistance or Medicare RX Payment Plan (Patient Must reach out to their plan, if eligible for payment plan), if available.    Ran test claim for colestipol  1 g and the current 30 day co-pay is $19.87.   This test claim was processed through [facility]- copay amounts may vary at other pharmacies due to pharmacy/plan contracts, or as the patient moves through the different stages of their insurance plan.     Reyes Sharps, CPHT Pharmacy Technician III Certified Patient Advocate [facility] Patient Advocate Team Direct Number: [phone]  Fax: [phone]

## 2024-05-04 ENCOUNTER — Telehealth: Payer: Self-pay | Admitting: Cardiology

## 2024-05-04 NOTE — Telephone Encounter (Signed)
   Pre-operative Risk Assessment    Patient Name: Alyssa Leonard  DOB: 03/03/1956 MRN: [id number]   Date of last office visit: 04/20/24 Date of next office visit: 06/08/24   Request for Surgical Clearance    Procedure:  upper endoscopic ultrasound  Date of Surgery:  Clearance TBD                                Surgeon:  not indicated Surgeon's Group or Practice Name:  [facility] Phone number:  [phone] Fax number:  [phone]   Type of Clearance Requested:   - Medical    Type of Anesthesia:  Not Indicated   Additional requests/questions:    SignedTinnie NOVAK Schools   05/04/2024, 4:36 PM

## 2024-05-05 ENCOUNTER — Other Ambulatory Visit: Payer: Self-pay | Admitting: Internal Medicine

## 2024-05-05 DIAGNOSIS — M79671 Pain in right foot: Secondary | ICD-10-CM

## 2024-05-05 NOTE — Telephone Encounter (Signed)
   Name: QUANESHIA WAREING  DOB: 12/10/1955  MRN: [id number]  Primary Cardiologist: Deatrice Cage, MD  Chart reviewed as part of pre-operative protocol coverage. The patient has an upcoming visit scheduled with Dr. Kennyth on 8/12/2025at which time clearance can be addressed in case there are any issues that would impact surgical recommendations.  Upper endoscopic ultrasound Is not scheduled until TBD as below. I added preop FYI to appointment note so that provider is aware to address at time of outpatient visit.  Per office protocol the cardiology provider should forward their finalized clearance decision and recommendations regarding antiplatelet therapy to the requesting party below.    This message will also be routed to pharmacy pool and/or Dr Kennyth for input on holding rivaroxaban  as requested below so that this information is available to the clearing provider at time of patient's appointment.   I will route this message as FYI to requesting party and remove this message from the preop box as separate preop APP input not needed at this time.   Please call with any questions.  Lamarr Satterfield, NP  05/05/2024, 8:56 AM

## 2024-05-05 NOTE — Telephone Encounter (Signed)
 Per Lamarr Satterfield, NP note pt has appt 06/08/24 with Dr Kennyth and preop clearance will be addressed at that time.

## 2024-06-03 ENCOUNTER — Other Ambulatory Visit: Payer: Self-pay | Admitting: Endocrinology

## 2024-06-03 DIAGNOSIS — E042 Nontoxic multinodular goiter: Secondary | ICD-10-CM

## 2024-06-07 NOTE — Progress Notes (Signed)
 Electrophysiology Office Note:   Date:  06/08/2024  ID:  Alyssa Leonard, DOB [birth year], MRN [id number]  Primary Cardiologist: Deatrice Cage, MD Electrophysiologist: Fonda Kitty, MD      History of Present Illness:   Alyssa Leonard is a 68 y.o. female with h/o AFib, HTN, T2DM, renal cell carcinoma, OSA on CPAP who is being seen today for follow up for atrial flutter.    Discussed the use of AI scribe software for clinical note transcription with the patient, who gave verbal consent to proceed.  History of Present Illness Alyssa Leonard is a 68 year old female with atrial flutter who presents for follow-up regarding her heart rhythm management.  Admitted 5/30-04/01/2024 for cellulitis vs gout flare, she received treatment for both. During hospitalization, she developed 2:1 AFL and was started on IV amiodarone . She was converted to PO amio prior to discharge along with xarelto  for stroke ppx.  She denies any symptoms associated with her arrhythmia. She denies any episodes of heart racing or pounding since discharge. She was taking Xarelto  but reports concern about continuing this medication due to cost. Her biggest concern at this time is her abdominal pain. She has renal cell cancer and reports concern about pancreatic cancer. She is also scheduled for an EGD. She is awaiting GI evaluation for her abdominal pain. She reports significant weight loss of over fifty pounds.   Review of systems complete and found to be negative unless listed in HPI.   EP Information / Studies Reviewed:    EKG is ordered today. Personal review as below.  EKG Interpretation Date/Time:  Tuesday June 08 2024 08:05:46 EDT Ventricular Rate:  88 PR Interval:  164 QRS Duration:  86 QT Interval:  376 QTC Calculation: 454 R Axis:   70  Text Interpretation: Normal sinus rhythm Minimal voltage criteria for LVH, may be normal variant ( R in aVL ) Possible Inferior infarct , age undetermined Possible Anterior  infarct (cited on or before 08-Jun-2024) When compared with ECG of 20-Apr-2024 09:59, Premature ventricular complexes are no longer Present T wave inversion now evident in Inferior leads T wave inversion now evident in Anterior leads Confirmed by Kitty Fonda [phone]) on 06/08/2024 8:08:40 AM   EKG 03/27/24: Typical AFL   Echo 03/28/24:   1. Left ventricular ejection fraction, by estimation, is 50 to 55%. The  left ventricle has low normal function. The left ventricle has no regional  wall motion abnormalities. There is mild concentric left ventricular  hypertrophy. Left ventricular  diastolic function could not be evaluated.   2. Right ventricle is heavily trabeculted. Right ventricular systolic  function is normal. The right ventricular size is mildly enlarged. There  is normal pulmonary artery systolic pressure.   3. Left atrial size was severely dilated.   4. The mitral valve is normal in structure. Mild mitral valve  regurgitation. No evidence of mitral stenosis.   5. The aortic valve is normal in structure. Aortic valve regurgitation is  not visualized. No aortic stenosis is present.   6. The inferior vena cava is normal in size with greater than 50%  respiratory variability, suggesting right atrial pressure of 3 mmHg.   7. Agitated saline contrast bubble study was negative, with no evidence  of any interatrial shunt.   Risk Assessment/Calculations:    CHA2DS2-VASc Score = 5   This indicates a 7.2% annual risk of stroke. The patient's score is based upon: CHF History: 0 HTN History: 1 Diabetes History: 1 Stroke History:  0 Vascular Disease History: 1 Age Score: 1 Gender Score: 1         Physical Exam:   VS:  BP (!) 166/106   Pulse 86   Ht 5' (1.524 m)   Wt 257 lb 3.2 oz (116.7 kg)   SpO2 98%   BMI 50.23 kg/m    Wt Readings from Last 3 Encounters:  06/08/24 257 lb 3.2 oz (116.7 kg)  04/20/24 278 lb 8 oz (126.3 kg)  03/27/24 285 lb 4.4 oz (129.4 kg)     GEN: Well  nourished, well developed in no acute distress NECK: No JVD CARDIAC: Normal rate, regular rhythm. RESPIRATORY:  Clear to auscultation without rales, wheezing or rhonchi  ABDOMEN: Obese, soft, non-distended EXTREMITIES:  No edema; No deformity   ASSESSMENT AND PLAN:    #Typical atrial flutter: #High risk medication use: Amiodarone . TSH 0.455. Follows with [facility].  Normal LFTs 04/2024. - Not an ablation candidate due to morbid obesity/BMI.  Encouraged weight loss. - Continue metoprolol  25 mg twice daily. - Continue amiodarone  200 mg daily.  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 She states she has had trouble affording this medication. We will ask our pharmacists to assist with this, inquire about assistance programs, or transition to Warfarin if there are no affordable DOAC options.   #Multinodular goiter and subclinical hyperthyroidism - Following with Endocrinology at [facility].   #Preop evaluation: Scheduled for EGD.  -No contraindication from an Electrophysiology perspective. Can interrupt anti-coagulation as necessary. Recommend minimizing time off anti-coagulation to reduce stroke risk.  Follow up with EP APP in 6 months.  Signed, Fonda Kitty, MD

## 2024-06-08 ENCOUNTER — Inpatient Hospital Stay
Admission: EM | Admit: 2024-06-08 | Discharge: 2024-06-11 | DRG: 445 | Disposition: A | Discharge status: Home / Self Care | Attending: Obstetrics and Gynecology | Admitting: Obstetrics and Gynecology

## 2024-06-08 ENCOUNTER — Encounter: Payer: Self-pay | Admitting: Emergency Medicine

## 2024-06-08 ENCOUNTER — Other Ambulatory Visit: Payer: Self-pay

## 2024-06-08 ENCOUNTER — Ambulatory Visit: Payer: Self-pay | Attending: Cardiology | Admitting: Cardiology

## 2024-06-08 ENCOUNTER — Emergency Department

## 2024-06-08 ENCOUNTER — Telehealth: Payer: Self-pay | Admitting: Pharmacy Technician

## 2024-06-08 ENCOUNTER — Other Ambulatory Visit (HOSPITAL_COMMUNITY): Payer: Self-pay

## 2024-06-08 ENCOUNTER — Encounter: Payer: Self-pay | Admitting: Cardiology

## 2024-06-08 VITALS — BP 166/106 | HR 86 | Ht 60.0 in | Wt 257.2 lb

## 2024-06-08 DIAGNOSIS — K8689 Other specified diseases of pancreas: Secondary | ICD-10-CM

## 2024-06-08 DIAGNOSIS — K219 Gastro-esophageal reflux disease without esophagitis: Secondary | ICD-10-CM | POA: Diagnosis present

## 2024-06-08 DIAGNOSIS — C7889 Secondary malignant neoplasm of other digestive organs: Secondary | ICD-10-CM | POA: Diagnosis present

## 2024-06-08 DIAGNOSIS — C649 Malignant neoplasm of unspecified kidney, except renal pelvis: Secondary | ICD-10-CM | POA: Diagnosis present

## 2024-06-08 DIAGNOSIS — Z79899 Other long term (current) drug therapy: Secondary | ICD-10-CM

## 2024-06-08 DIAGNOSIS — Z8249 Family history of ischemic heart disease and other diseases of the circulatory system: Secondary | ICD-10-CM

## 2024-06-08 DIAGNOSIS — D6869 Other thrombophilia: Secondary | ICD-10-CM

## 2024-06-08 DIAGNOSIS — Z7901 Long term (current) use of anticoagulants: Secondary | ICD-10-CM

## 2024-06-08 DIAGNOSIS — C801 Malignant (primary) neoplasm, unspecified: Principal | ICD-10-CM | POA: Diagnosis present

## 2024-06-08 DIAGNOSIS — Z85528 Personal history of other malignant neoplasm of kidney: Secondary | ICD-10-CM

## 2024-06-08 DIAGNOSIS — R109 Unspecified abdominal pain: Principal | ICD-10-CM

## 2024-06-08 DIAGNOSIS — Z91048 Other nonmedicinal substance allergy status: Secondary | ICD-10-CM

## 2024-06-08 DIAGNOSIS — K5900 Constipation, unspecified: Secondary | ICD-10-CM | POA: Diagnosis present

## 2024-06-08 DIAGNOSIS — Z9221 Personal history of antineoplastic chemotherapy: Secondary | ICD-10-CM

## 2024-06-08 DIAGNOSIS — Z6841 Body Mass Index (BMI) 40.0 and over, adult: Secondary | ICD-10-CM

## 2024-06-08 DIAGNOSIS — Z841 Family history of disorders of kidney and ureter: Secondary | ICD-10-CM

## 2024-06-08 DIAGNOSIS — E66813 Obesity, class 3: Secondary | ICD-10-CM

## 2024-06-08 DIAGNOSIS — J4489 Other specified chronic obstructive pulmonary disease: Secondary | ICD-10-CM | POA: Diagnosis present

## 2024-06-08 DIAGNOSIS — Z87891 Personal history of nicotine dependence: Secondary | ICD-10-CM

## 2024-06-08 DIAGNOSIS — I10 Essential (primary) hypertension: Secondary | ICD-10-CM | POA: Diagnosis present

## 2024-06-08 DIAGNOSIS — C641 Malignant neoplasm of right kidney, except renal pelvis: Secondary | ICD-10-CM | POA: Diagnosis present

## 2024-06-08 DIAGNOSIS — I4892 Unspecified atrial flutter: Secondary | ICD-10-CM | POA: Diagnosis present

## 2024-06-08 DIAGNOSIS — Z9884 Bariatric surgery status: Secondary | ICD-10-CM

## 2024-06-08 DIAGNOSIS — G4733 Obstructive sleep apnea (adult) (pediatric): Secondary | ICD-10-CM | POA: Diagnosis present

## 2024-06-08 DIAGNOSIS — K8500 Idiopathic acute pancreatitis without necrosis or infection: Secondary | ICD-10-CM

## 2024-06-08 DIAGNOSIS — K831 Obstruction of bile duct: Principal | ICD-10-CM | POA: Diagnosis present

## 2024-06-08 DIAGNOSIS — D630 Anemia in neoplastic disease: Secondary | ICD-10-CM | POA: Diagnosis present

## 2024-06-08 DIAGNOSIS — Z91040 Latex allergy status: Secondary | ICD-10-CM

## 2024-06-08 DIAGNOSIS — I483 Typical atrial flutter: Secondary | ICD-10-CM

## 2024-06-08 DIAGNOSIS — Z01810 Encounter for preprocedural cardiovascular examination: Secondary | ICD-10-CM

## 2024-06-08 DIAGNOSIS — E119 Type 2 diabetes mellitus without complications: Secondary | ICD-10-CM | POA: Diagnosis present

## 2024-06-08 DIAGNOSIS — Z860100 Personal history of colon polyps, unspecified: Secondary | ICD-10-CM

## 2024-06-08 DIAGNOSIS — E042 Nontoxic multinodular goiter: Secondary | ICD-10-CM

## 2024-06-08 DIAGNOSIS — Z886 Allergy status to analgesic agent status: Secondary | ICD-10-CM

## 2024-06-08 DIAGNOSIS — J449 Chronic obstructive pulmonary disease, unspecified: Secondary | ICD-10-CM | POA: Diagnosis present

## 2024-06-08 LAB — URINALYSIS, ROUTINE W REFLEX MICROSCOPIC
Glucose, UA: NEGATIVE mg/dL
Hgb urine dipstick: NEGATIVE
Ketones, ur: 5 mg/dL — AB
Leukocytes,Ua: NEGATIVE
Nitrite: NEGATIVE
Protein, ur: 30 mg/dL — AB
Specific Gravity, Urine: 1.015 (ref 1.005–1.030)
pH: 5 (ref 5.0–8.0)

## 2024-06-08 LAB — CBC WITH DIFFERENTIAL/PLATELET
Abs Immature Granulocytes: 0.02 K/uL (ref 0.00–0.07)
Basophils Absolute: 0 K/uL (ref 0.0–0.1)
Basophils Relative: 0 %
Eosinophils Absolute: 0.1 K/uL (ref 0.0–0.5)
Eosinophils Relative: 2 %
HCT: 38 % (ref 36.0–46.0)
Hemoglobin: 12.4 g/dL (ref 12.0–15.0)
Immature Granulocytes: 0 %
Lymphocytes Relative: 26 %
Lymphs Abs: 1.7 K/uL (ref 0.7–4.0)
MCH: 28.1 pg (ref 26.0–34.0)
MCHC: 32.6 g/dL (ref 30.0–36.0)
MCV: 86 fL (ref 80.0–100.0)
Monocytes Absolute: 0.6 K/uL (ref 0.1–1.0)
Monocytes Relative: 9 %
Neutro Abs: 4.2 K/uL (ref 1.7–7.7)
Neutrophils Relative %: 63 %
Platelets: 209 K/uL (ref 150–400)
RBC: 4.42 MIL/uL (ref 3.87–5.11)
RDW: 16 % — ABNORMAL HIGH (ref 11.5–15.5)
WBC: 6.6 K/uL (ref 4.0–10.5)
nRBC: 0 % (ref 0.0–0.2)

## 2024-06-08 LAB — COMPREHENSIVE METABOLIC PANEL WITH GFR
ALT: 112 U/L — ABNORMAL HIGH (ref 0–44)
AST: 140 U/L — ABNORMAL HIGH (ref 15–41)
Albumin: 2.8 g/dL — ABNORMAL LOW (ref 3.5–5.0)
Alkaline Phosphatase: 450 U/L — ABNORMAL HIGH (ref 38–126)
Anion gap: 13 (ref 5–15)
BUN: 12 mg/dL (ref 8–23)
CO2: 24 mmol/L (ref 22–32)
Calcium: 9.3 mg/dL (ref 8.9–10.3)
Chloride: 104 mmol/L (ref 98–111)
Creatinine, Ser: 0.82 mg/dL (ref 0.44–1.00)
GFR, Estimated: >60 mL/min (ref >60)
Glucose, Bld: 123 mg/dL — ABNORMAL HIGH (ref 70–99)
Potassium: 3 mmol/L — ABNORMAL LOW (ref 3.5–5.1)
Sodium: 141 mmol/L (ref 135–145)
Total Bilirubin: 9.8 mg/dL — ABNORMAL HIGH (ref 0.0–1.2)
Total Protein: 6.3 g/dL — ABNORMAL LOW (ref 6.5–8.1)

## 2024-06-08 LAB — LIPASE, BLOOD: Lipase: 27 U/L (ref 11–51)

## 2024-06-08 MED ORDER — MORPHINE SULFATE (PF) 4 MG/ML IV SOLN
4.0000 mg | Freq: Once | INTRAVENOUS | Status: AC
  Administered 2024-06-08 (×2): 4 mg via INTRAVENOUS
  Filled 2024-06-08: qty 1

## 2024-06-08 MED ORDER — ONDANSETRON HCL 4 MG/2ML IJ SOLN
4.0000 mg | Freq: Once | INTRAMUSCULAR | Status: AC
  Administered 2024-06-08 (×2): 4 mg via INTRAVENOUS
  Filled 2024-06-08: qty 2

## 2024-06-08 MED ORDER — IOHEXOL 350 MG/ML SOLN
100.0000 mL | Freq: Once | INTRAVENOUS | Status: AC | PRN
  Administered 2024-06-08 (×2): 100 mL via INTRAVENOUS

## 2024-06-08 NOTE — Telephone Encounter (Signed)
 Pharmacy Patient Advocate Encounter  Insurance verification completed.   The patient is insured through [employer] test claim for Dabigatran. Currently a quantity of 60 is a 30 day supply and the co-pay is $53.03 .   This test claim was processed through [facility]- copay amounts may vary at other pharmacies due to pharmacy/plan contracts, or as the patient moves through the different stages of their insurance plan.

## 2024-06-08 NOTE — ED Provider Notes (Signed)
 [facility] Provider Note    Event Date/Time   First MD Initiated Contact with Patient 06/08/24 1857     (approximate)   History   Abdominal Pain   HPI  Alyssa Alyssa is a 68 y.o. female history of kidney cancer, hypertension, COPD, presents emergency department with abdominal pain.  Patient states she does infusions for the kidney cancer and has been doing this for 1 year.  Abdominal pain for about 1 to 2 weeks.  Did have a bowel movement this morning.  States pain is all over but worse along the epigastric and left lower side.  Patient does not have a gallbladder.  Thinks she does still have her appendix but does not have a uterus.  She denies fever or chills.  No vomiting.  States the bowel movement has been hard but stringy.  It is yellow in color.      Physical Exam   Triage Vital Signs: ED Triage Vitals  Encounter Vitals Group     BP 06/08/24 1625 (!) 151/97     Girls Systolic BP Percentile --      Girls Diastolic BP Percentile --      Boys Systolic BP Percentile --      Boys Diastolic BP Percentile --      Pulse Rate 06/08/24 1620 94     Resp 06/08/24 1625 19     Temp 06/08/24 1620 98.1 F (36.7 C)     Temp Source 06/08/24 1620 Oral     SpO2 06/08/24 1620 100 %     Weight --      Height --      Head Circumference --      Peak Flow --      Pain Score --      Pain Loc --      Pain Education --      Exclude from Growth Chart --     Most recent vital signs: Vitals:   06/08/24 1625 06/08/24 2215  BP: (!) 151/97 (!) 151/79  Pulse:  81  Resp: 19 18  Temp:  97.7 F (36.5 C)  SpO2:  100%     General: Awake, no distress.   CV:  Good peripheral perfusion. regular rate and  rhythm Resp:  Normal effort. Lungs cta Abd:  No distention.  Tender in the left lower quadrant, left upper quadrant and right lower quadrant, cholecystectomy scar noted across her abdomen Other:      ED Results / Procedures / Treatments   Labs (all labs  ordered are listed, but only abnormal results are displayed) Labs Reviewed  CBC WITH DIFFERENTIAL/PLATELET - Abnormal; Notable for the following components:      Result Value   RDW 16.0 (*)    All other components within normal limits  URINALYSIS, ROUTINE W REFLEX MICROSCOPIC - Abnormal; Notable for the following components:   Color, Urine AMBER (*)    APPearance HAZY (*)    Bilirubin Urine SMALL (*)    Ketones, ur 5 (*)    Protein, ur 30 (*)    Bacteria, UA RARE (*)    All other components within normal limits  COMPREHENSIVE METABOLIC PANEL WITH GFR - Abnormal; Notable for the following components:   Potassium 3.0 (*)    Glucose, Bld 123 (*)    Total Protein 6.3 (*)    Albumin 2.8 (*)    AST 140 (*)    ALT 112 (*)    Alkaline Phosphatase 450 (*)  Total Bilirubin 9.8 (*)    All other components within normal limits  LIPASE, BLOOD     EKG     RADIOLOGY CT abdomen pelvis IV contrast    PROCEDURES:   Procedures  Critical Care:  no Chief Complaint  Patient presents with   Abdominal Pain      MEDICATIONS ORDERED IN ED: Medications  ondansetron  (ZOFRAN ) injection 4 mg (4 mg Intravenous Given 06/08/24 2147)  morphine  (PF) 4 MG/ML injection 4 mg (4 mg Intravenous Given 06/08/24 2147)  iohexol  (OMNIPAQUE ) 350 MG/ML injection 100 mL (100 mLs Intravenous Contrast Given 06/08/24 2152)     IMPRESSION / MDM / ASSESSMENT AND PLAN / ED COURSE  I reviewed the triage vital signs and the nursing notes.                              Differential diagnosis includes, but is not limited to, pancreatitis, bowel obstruction, abscess, diverticulitis, worsening kidney cancer, mass, appendicitis, constipation  Patient's presentation is most consistent with acute illness / injury with system symptoms.    Medications given: Morphine  4 mg IV  Due to the patient's past medical history of renal cell cancer and undergoing infusions along with history of pancreatic polyps I do feel  her abdominal pain needs to be worked up with a CT of the abdomen and pelvis.  CBC reassuring, lipase reassuring, there is no metabolic panel ordered from triage so we will go ahead and do metabolic panel to assess for kidney function etc.   Metabolic panel with potassium of 3 indicating hypokalemia, LFTs grossly elevated with AST of 140, ALT 112, total bili 9.8 and alkaline phosphatase 450  CT abdomen pelvis IV contrast due to new onset of abdominal pain combined with recent renal cell carcinoma  I did independently review and interpret radiologist reading as being positive for worsening carcinoma, metastatic disease, etc. see report below IMPRESSION:  1. Interval increase in size of right renal mass worrisome for renal  cell carcinoma.  2. Interval increase in size of pancreatic head mass worrisome for  metastatic disease or primary pancreatic neoplasm.  3. Interval increase in intra and extrahepatic biliary ductal  dilatation likely secondary to obstruction at the level of the  pancreatic head mass.  4. Loss of fat plane between the pancreatic mass and gastric antrum  and duodenum worrisome for invasion.  5. Mild inflammation surrounding the pancreatic head worrisome for  pancreatitis.  6. Diverticulosis.    Discussed with Dr. Cyrena.  Feels patient should be admitted with concerns of the LFTs being so high and could have some biliary tree obstruction.  Consult hospitalist, they are speak with Dr. Josepha as I am at end of shift.  Patient is agreeable for admission  FINAL CLINICAL IMPRESSION(S) / ED DIAGNOSES   Final diagnoses:  Abdominal pain, unspecified abdominal location  Renal cell carcinoma of right kidney (HCC)  Pancreatic mass  Idiopathic acute pancreatitis without infection or necrosis     Rx / DC Orders   ED Discharge Orders     None        Note:  This document was prepared using Dragon voice recognition software and may include unintentional dictation  errors.    Gasper Devere ORN, PA-C 06/09/24 FESTUS    Willo Dunnings, MD 06/09/24 [phone]

## 2024-06-08 NOTE — Telephone Encounter (Signed)
 Pharmacy Patient Advocate Encounter  Insurance verification completed.   The patient is insured through [employer] test claim for Warfarin. Currently a quantity of 30 is a 30 day supply and the co-pay is $1.82 .   This test claim was processed through [facility]- copay amounts may vary at other pharmacies due to pharmacy/plan contracts, or as the patient moves through the different stages of their insurance plan.

## 2024-06-08 NOTE — ED Provider Triage Note (Signed)
 Emergency Medicine Provider Triage Evaluation Note  MAI LONGNECKER , a 68 y.o. female  was evaluated in triage.  Pt complains of constipation, acholia, choluria, nauseous, gagging, decreased appetite, upper abdominal pain in bandlike that radiates to the back.  Patient is in treatment for renal cancer.  Patient denies fever, dysuria  Review of Systems  Positive:  Negative:   Physical Exam  BP (!) 151/97   Pulse 94   Temp 98.1 F (36.7 C) (Oral)   Resp 19   SpO2 100% during triage patient is hypertensive Gen:   Awake, no distress.  Conjunctive: jaundice Resp:  Normal effort  MSK:   Moves extremities without difficulty  Other:    Medical Decision Making  Medically screening exam initiated at 4:28 PM.  Appropriate orders placed.  JASALYN FRYSINGER was informed that the remainder of the evaluation will be completed by another provider, this initial triage assessment does not replace that evaluation, and the importance of remaining in the ED until their evaluation is complete. Who presents today with history of constipation, acholia, choluria, upper abdominal pain, abdominal pain radiates to the back decreased appetite.  Denies dysuria, fever.  CBC CMP lipase    Janit Kast, PA-C 06/08/24 1631

## 2024-06-08 NOTE — Patient Instructions (Signed)
 Medication Instructions:  Your physician recommends that you continue on your current medications as directed. Please refer to the Current Medication list given to you today.  *If you need a refill on your cardiac medications before your next appointment, please call your pharmacy*  Follow-Up: At [facility], you and your health needs are our priority.  As part of our continuing mission to provide you with exceptional heart care, our providers are all part of one team.  This team includes your primary Cardiologist (physician) and Advanced Practice Providers or APPs (Physician Assistants and Nurse Practitioners) who all work together to provide you with the care you need, when you need it.  Your next appointment:   6 months  Provider:   Suzann Riddle, NP

## 2024-06-08 NOTE — ED Triage Notes (Signed)
 Pt reports abdominal pain x 1 week ago. Pt reports last BM this morning. Denies fevers and vomiting. PT also reports bilateral mid back pain.

## 2024-06-08 NOTE — Telephone Encounter (Signed)
 Pharmacy Patient Advocate Encounter  Insurance verification completed.   The patient is insured through [employer] test claim for Eliquis. Currently a quantity of 60 is a 30 day supply and the co-pay is $152.71 .   This test claim was processed through [facility]- copay amounts may vary at other pharmacies due to pharmacy/plan contracts, or as the patient moves through the different stages of their insurance plan.

## 2024-06-09 ENCOUNTER — Observation Stay: Admitting: Radiology

## 2024-06-09 DIAGNOSIS — J4489 Other specified chronic obstructive pulmonary disease: Secondary | ICD-10-CM | POA: Diagnosis present

## 2024-06-09 DIAGNOSIS — Z8249 Family history of ischemic heart disease and other diseases of the circulatory system: Secondary | ICD-10-CM | POA: Diagnosis not present

## 2024-06-09 DIAGNOSIS — Z91040 Latex allergy status: Secondary | ICD-10-CM | POA: Diagnosis not present

## 2024-06-09 DIAGNOSIS — D630 Anemia in neoplastic disease: Secondary | ICD-10-CM | POA: Diagnosis present

## 2024-06-09 DIAGNOSIS — I4892 Unspecified atrial flutter: Secondary | ICD-10-CM | POA: Diagnosis present

## 2024-06-09 DIAGNOSIS — C641 Malignant neoplasm of right kidney, except renal pelvis: Secondary | ICD-10-CM | POA: Diagnosis present

## 2024-06-09 DIAGNOSIS — C7889 Secondary malignant neoplasm of other digestive organs: Secondary | ICD-10-CM | POA: Diagnosis present

## 2024-06-09 DIAGNOSIS — Z87891 Personal history of nicotine dependence: Secondary | ICD-10-CM | POA: Diagnosis not present

## 2024-06-09 DIAGNOSIS — K5900 Constipation, unspecified: Secondary | ICD-10-CM | POA: Diagnosis present

## 2024-06-09 DIAGNOSIS — Z860100 Personal history of colon polyps, unspecified: Secondary | ICD-10-CM | POA: Diagnosis not present

## 2024-06-09 DIAGNOSIS — I10 Essential (primary) hypertension: Secondary | ICD-10-CM | POA: Diagnosis present

## 2024-06-09 DIAGNOSIS — Z9221 Personal history of antineoplastic chemotherapy: Secondary | ICD-10-CM | POA: Diagnosis not present

## 2024-06-09 DIAGNOSIS — K219 Gastro-esophageal reflux disease without esophagitis: Secondary | ICD-10-CM | POA: Diagnosis present

## 2024-06-09 DIAGNOSIS — K831 Obstruction of bile duct: Secondary | ICD-10-CM

## 2024-06-09 DIAGNOSIS — K838 Other specified diseases of biliary tract: Secondary | ICD-10-CM

## 2024-06-09 DIAGNOSIS — Z9884 Bariatric surgery status: Secondary | ICD-10-CM | POA: Diagnosis not present

## 2024-06-09 DIAGNOSIS — E119 Type 2 diabetes mellitus without complications: Secondary | ICD-10-CM | POA: Diagnosis present

## 2024-06-09 DIAGNOSIS — Z79899 Other long term (current) drug therapy: Secondary | ICD-10-CM | POA: Diagnosis not present

## 2024-06-09 DIAGNOSIS — Z85528 Personal history of other malignant neoplasm of kidney: Secondary | ICD-10-CM | POA: Diagnosis not present

## 2024-06-09 DIAGNOSIS — Z6841 Body Mass Index (BMI) 40.0 and over, adult: Secondary | ICD-10-CM | POA: Diagnosis not present

## 2024-06-09 DIAGNOSIS — Z91048 Other nonmedicinal substance allergy status: Secondary | ICD-10-CM | POA: Diagnosis not present

## 2024-06-09 DIAGNOSIS — Z886 Allergy status to analgesic agent status: Secondary | ICD-10-CM | POA: Diagnosis not present

## 2024-06-09 DIAGNOSIS — C801 Malignant (primary) neoplasm, unspecified: Principal | ICD-10-CM | POA: Diagnosis present

## 2024-06-09 DIAGNOSIS — Z7901 Long term (current) use of anticoagulants: Secondary | ICD-10-CM | POA: Diagnosis not present

## 2024-06-09 DIAGNOSIS — K8689 Other specified diseases of pancreas: Secondary | ICD-10-CM | POA: Diagnosis present

## 2024-06-09 DIAGNOSIS — G4733 Obstructive sleep apnea (adult) (pediatric): Secondary | ICD-10-CM | POA: Diagnosis present

## 2024-06-09 HISTORY — PX: IR INT EXT BILIARY DRAIN WITH CHOLANGIOGRAM: IMG6044

## 2024-06-09 LAB — CBC
HCT: 36.5 % (ref 36.0–46.0)
Hemoglobin: 12.1 g/dL (ref 12.0–15.0)
MCH: 28.5 pg (ref 26.0–34.0)
MCHC: 33.2 g/dL (ref 30.0–36.0)
MCV: 86.1 fL (ref 80.0–100.0)
Platelets: 179 K/uL (ref 150–400)
RBC: 4.24 MIL/uL (ref 3.87–5.11)
RDW: 16.2 % — ABNORMAL HIGH (ref 11.5–15.5)
WBC: 6.5 K/uL (ref 4.0–10.5)
nRBC: 0 % (ref 0.0–0.2)

## 2024-06-09 LAB — GLUCOSE, CAPILLARY
Glucose-Capillary: 100 mg/dL — ABNORMAL HIGH (ref 70–99)
Glucose-Capillary: 85 mg/dL (ref 70–99)
Glucose-Capillary: 74 mg/dL (ref 70–99)
Glucose-Capillary: 123 mg/dL — ABNORMAL HIGH (ref 70–99)

## 2024-06-09 LAB — COMPREHENSIVE METABOLIC PANEL WITH GFR
ALT: 106 U/L — ABNORMAL HIGH (ref 0–44)
AST: 130 U/L — ABNORMAL HIGH (ref 15–41)
Albumin: 2.8 g/dL — ABNORMAL LOW (ref 3.5–5.0)
Alkaline Phosphatase: 446 U/L — ABNORMAL HIGH (ref 38–126)
Anion gap: 11 (ref 5–15)
BUN: 12 mg/dL (ref 8–23)
CO2: 25 mmol/L (ref 22–32)
Calcium: 9.1 mg/dL (ref 8.9–10.3)
Chloride: 106 mmol/L (ref 98–111)
Creatinine, Ser: 0.6 mg/dL (ref 0.44–1.00)
GFR, Estimated: >60 mL/min (ref >60)
Glucose, Bld: 116 mg/dL — ABNORMAL HIGH (ref 70–99)
Potassium: 3.1 mmol/L — ABNORMAL LOW (ref 3.5–5.1)
Sodium: 142 mmol/L (ref 135–145)
Total Bilirubin: 10.3 mg/dL — ABNORMAL HIGH (ref 0.0–1.2)
Total Protein: 6.4 g/dL — ABNORMAL LOW (ref 6.5–8.1)

## 2024-06-09 LAB — PROTIME-INR
INR: 1.1 (ref 0.8–1.2)
Prothrombin Time: 14.6 s (ref 11.4–15.2)

## 2024-06-09 LAB — HEMOGLOBIN A1C
Hgb A1c MFr Bld: 5.5 % (ref 4.8–5.6)
Mean Plasma Glucose: 111 mg/dL

## 2024-06-09 LAB — MAGNESIUM: Magnesium: 2 mg/dL (ref 1.7–2.4)

## 2024-06-09 MED ORDER — POTASSIUM CHLORIDE CRYS ER 20 MEQ PO TBCR
60.0000 meq | EXTENDED_RELEASE_TABLET | Freq: Once | ORAL | Status: AC
Start: 2024-06-09 — End: 2024-06-09
  Administered 2024-06-09 (×2): 60 meq via ORAL
  Filled 2024-06-09: qty 3

## 2024-06-09 MED ORDER — DIAZEPAM 2 MG PO TABS: 2.0000 mg | ORAL_TABLET | Freq: Two times a day (BID) | ORAL | Status: DC | PRN

## 2024-06-09 MED ORDER — SODIUM CHLORIDE 0.9 % IV SOLN
1.0000 g | Freq: Once | INTRAVENOUS | Status: AC
  Administered 2024-06-09 (×2): 1 g via INTRAVENOUS
  Filled 2024-06-09: qty 10

## 2024-06-09 MED ORDER — INSULIN ASPART 100 UNIT/ML IJ SOLN
0.0000 [IU] | INTRAMUSCULAR | Status: DC
  Filled 2024-06-09: qty 1

## 2024-06-09 MED ORDER — METRONIDAZOLE 500 MG/100ML IV SOLN
500.0000 mg | Freq: Once | INTRAVENOUS | Status: AC
  Administered 2024-06-09 (×2): 500 mg via INTRAVENOUS
  Filled 2024-06-09: qty 100

## 2024-06-09 MED ORDER — OXYCODONE HCL 5 MG PO TABS
5.0000 mg | ORAL_TABLET | Freq: Four times a day (QID) | ORAL | Status: DC | PRN
  Administered 2024-06-09 – 2024-06-11 (×4): 5 mg via ORAL
  Filled 2024-06-09 (×3): qty 1

## 2024-06-09 MED ORDER — LIDOCAINE HCL 1 % IJ SOLN
INTRAMUSCULAR | Status: AC
  Filled 2024-06-09: qty 20

## 2024-06-09 MED ORDER — LIDOCAINE HCL 1 % IJ SOLN
10.0000 mL | Freq: Once | INTRAMUSCULAR | Status: AC
  Administered 2024-06-09 (×2): 7 mL via INTRADERMAL
  Filled 2024-06-09: qty 10

## 2024-06-09 MED ORDER — SODIUM CHLORIDE 0.9% FLUSH
5.0000 mL | Freq: Three times a day (TID) | INTRAVENOUS | Status: DC
  Administered 2024-06-09 – 2024-06-11 (×8): 5 mL

## 2024-06-09 MED ORDER — DIPHENHYDRAMINE HCL 25 MG PO CAPS
25.0000 mg | ORAL_CAPSULE | Freq: Four times a day (QID) | ORAL | Status: DC | PRN
  Administered 2024-06-09 – 2024-06-10 (×3): 25 mg via ORAL
  Filled 2024-06-09 (×2): qty 1

## 2024-06-09 MED ORDER — AMIODARONE HCL 200 MG PO TABS
200.0000 mg | ORAL_TABLET | Freq: Every day | ORAL | Status: DC
  Administered 2024-06-09 – 2024-06-11 (×4): 200 mg via ORAL
  Filled 2024-06-09 (×3): qty 1

## 2024-06-09 MED ORDER — ACETAMINOPHEN 325 MG PO TABS
650.0000 mg | ORAL_TABLET | Freq: Four times a day (QID) | ORAL | Status: DC | PRN
  Administered 2024-06-09 – 2024-06-10 (×3): 650 mg via ORAL
  Filled 2024-06-09 (×2): qty 2

## 2024-06-09 MED ORDER — SODIUM CHLORIDE 0.9 % IV SOLN: INTRAVENOUS | Status: DC

## 2024-06-09 MED ORDER — ALBUTEROL SULFATE (2.5 MG/3ML) 0.083% IN NEBU: 2.5000 mg | INHALATION_SOLUTION | Freq: Four times a day (QID) | RESPIRATORY_TRACT | Status: DC | PRN

## 2024-06-09 MED ORDER — FENTANYL CITRATE (PF) 100 MCG/2ML IJ SOLN
INTRAMUSCULAR | Status: AC
  Filled 2024-06-09: qty 2

## 2024-06-09 MED ORDER — LOSARTAN POTASSIUM 25 MG PO TABS
25.0000 mg | ORAL_TABLET | Freq: Every day | ORAL | Status: DC
  Administered 2024-06-09 – 2024-06-11 (×4): 25 mg via ORAL
  Filled 2024-06-09 (×3): qty 1

## 2024-06-09 MED ORDER — MIDAZOLAM HCL 5 MG/5ML IJ SOLN
INTRAMUSCULAR | Status: AC | PRN
  Administered 2024-06-09 (×2): .5 mg via INTRAVENOUS
  Administered 2024-06-09 (×2): 1 mg via INTRAVENOUS
  Administered 2024-06-09 (×2): .5 mg via INTRAVENOUS

## 2024-06-09 MED ORDER — FENTANYL CITRATE (PF) 100 MCG/2ML IJ SOLN
INTRAMUSCULAR | Status: AC | PRN
  Administered 2024-06-09 (×2): 25 ug via INTRAVENOUS
  Administered 2024-06-09: 50 ug via INTRAVENOUS
  Administered 2024-06-09 (×5): 25 ug via INTRAVENOUS
  Administered 2024-06-09: 50 ug via INTRAVENOUS
  Administered 2024-06-09 (×5): 25 ug via INTRAVENOUS

## 2024-06-09 MED ORDER — MORPHINE SULFATE (PF) 2 MG/ML IV SOLN
INTRAVENOUS | Status: AC
Start: 2024-06-09 — End: 2024-06-09
  Filled 2024-06-09: qty 1

## 2024-06-09 MED ORDER — FAMOTIDINE 20 MG PO TABS
40.0000 mg | ORAL_TABLET | Freq: Every day | ORAL | Status: DC
  Administered 2024-06-09 – 2024-06-10 (×3): 40 mg via ORAL
  Filled 2024-06-09 (×2): qty 2

## 2024-06-09 MED ORDER — ONDANSETRON HCL 4 MG PO TABS: 4.0000 mg | ORAL_TABLET | Freq: Four times a day (QID) | ORAL | Status: DC | PRN

## 2024-06-09 MED ORDER — MIDAZOLAM HCL 2 MG/2ML IJ SOLN
INTRAMUSCULAR | Status: AC
  Filled 2024-06-09: qty 2

## 2024-06-09 MED ORDER — METRONIDAZOLE 500 MG/100ML IV SOLN
500.0000 mg | Freq: Two times a day (BID) | INTRAVENOUS | Status: DC
  Administered 2024-06-09 – 2024-06-11 (×5): 500 mg via INTRAVENOUS
  Filled 2024-06-09 (×5): qty 100

## 2024-06-09 MED ORDER — MIDAZOLAM HCL 2 MG/2ML IJ SOLN
INTRAMUSCULAR | Status: AC
Start: 2024-06-09 — End: 2024-06-09
  Filled 2024-06-09: qty 2

## 2024-06-09 MED ORDER — ACETAMINOPHEN 650 MG RE SUPP: 650.0000 mg | Freq: Four times a day (QID) | RECTAL | Status: DC | PRN

## 2024-06-09 MED ORDER — SODIUM CHLORIDE 0.9 % IV SOLN
2.0000 g | INTRAVENOUS | Status: DC
  Administered 2024-06-09 – 2024-06-11 (×3): 2 g via INTRAVENOUS
  Filled 2024-06-09 (×2): qty 20

## 2024-06-09 MED ORDER — FENTANYL CITRATE (PF) 100 MCG/2ML IJ SOLN
INTRAMUSCULAR | Status: AC
Start: 2024-06-09 — End: 2024-06-09
  Filled 2024-06-09: qty 2

## 2024-06-09 MED ORDER — IOHEXOL 300 MG/ML  SOLN
10.0000 mL | Freq: Once | INTRAMUSCULAR | Status: AC | PRN
  Administered 2024-06-09 (×2): 40 mL

## 2024-06-09 MED ORDER — ONDANSETRON HCL 4 MG/2ML IJ SOLN
4.0000 mg | Freq: Four times a day (QID) | INTRAMUSCULAR | Status: DC | PRN
  Administered 2024-06-09 – 2024-06-11 (×3): 4 mg via INTRAVENOUS
  Filled 2024-06-09 (×2): qty 2

## 2024-06-09 MED ORDER — MIDAZOLAM HCL 2 MG/2ML IJ SOLN
INTRAMUSCULAR | Status: AC | PRN
  Administered 2024-06-09 (×2): .5 mg via INTRAVENOUS

## 2024-06-09 MED ORDER — METOPROLOL TARTRATE 25 MG PO TABS
25.0000 mg | ORAL_TABLET | Freq: Two times a day (BID) | ORAL | Status: DC
  Administered 2024-06-09 – 2024-06-11 (×9): 25 mg via ORAL
  Filled 2024-06-09 (×6): qty 1

## 2024-06-09 MED ORDER — MORPHINE SULFATE (PF) 2 MG/ML IV SOLN
2.0000 mg | INTRAVENOUS | Status: DC | PRN
  Administered 2024-06-09 (×4): 2 mg via INTRAVENOUS
  Filled 2024-06-09: qty 1

## 2024-06-09 MED ORDER — LATANOPROST 0.005 % OP SOLN
1.0000 [drp] | Freq: Every day | OPHTHALMIC | Status: DC
  Administered 2024-06-09 – 2024-06-10 (×3): 1 [drp] via OPHTHALMIC
  Filled 2024-06-09: qty 2.5

## 2024-06-09 NOTE — Assessment & Plan Note (Signed)
 Not acutely exacerbated DuoNebs as needed

## 2024-06-09 NOTE — Consult Note (Addendum)
 Chief Complaint: Obstructive Biliary Process  Referring Provider(s): Dr. Kandis  Supervising Physician: Karalee Beat  Patient Status: [facility] - In-pt  History of Present Illness: Alyssa Leonard is a 68 y.o. female with history of metastatic RCC, aflutter (never started [facility] d/t financial constraints), COPD, GERD, OSA (wears CPAP), DM, HTN, s/p roux-en-Y. She presented to the ED with abdominal pain and was subsequently admitted for obstructive jaundice secondary to intra-abdominal metastasis. CT 8/12: Interval increase in size of pancreatic head mass worrisome for metastatic disease or primary pancreatic neoplasm. Interval increase in intra and extrahepatic biliary ductal dilatation likely secondary to obstruction at the level of the pancreatic head mass. Current care facility is not appropriate level of care for ERCP for a patient with history of roux-en-y. IR consulted to complete transhepatic cholangiogram with percutaneous biliary tube placement.    Confirms NPO since [place]. She never started [facility] prescribed for aflutter for financial reasons.   Denies fever, chills, SOB, CP, sore throat, N/V, blood in stool or urine. Endorses constipation and epigastric and RUQ abd pain.   Allergies Reviewed:  Latex, Aspirin, and Plasticized base [plastibase]   Patient is Full Code  Past Medical History:  Diagnosis Date   Asthma    Atrial flutter (HCC)    COPD (chronic obstructive pulmonary disease) (HCC)    Diabetes mellitus without complication (HCC)    GERD (gastroesophageal reflux disease)    History of colon polyps    Hypertension    Meniere disease    OSA (obstructive sleep apnea)    Renal cell carcinoma (HCC)    Sleep apnea    Went for sleep study test in January 2018 I haven't heard back from test    Past Surgical History:  Procedure Laterality Date   ABDOMINAL HYSTERECTOMY     BARIATRIC SURGERY     CHOLECYSTECTOMY     COLONOSCOPY WITH PROPOFOL  N/A 12/11/2016    Procedure: COLONOSCOPY WITH PROPOFOL ;  Surgeon: Lamar ONEIDA Holmes, MD;  Location: [facility] ENDOSCOPY;  Service: Endoscopy;  Laterality: N/A;   COLONOSCOPY WITH PROPOFOL  N/A 11/27/2021   Procedure: COLONOSCOPY WITH PROPOFOL ;  Surgeon: Maryruth Ole ONEIDA, MD;  Location: [facility] ENDOSCOPY;  Service: Endoscopy;  Laterality: N/A;      Medications: Prior to Admission medications   Medication Sig Start Date End Date Taking? Authorizing Provider  acetaminophen  (TYLENOL ) 500 MG tablet Take 1,000 mg by mouth every 6 (six) hours as needed for mild pain (pain score 1-3).   Yes [provider]  albuterol  (PROVENTIL  HFA;VENTOLIN  HFA) 108 (90 Base) MCG/ACT inhaler Inhale 2 puffs into the lungs every 6 (six) hours as needed for wheezing or shortness of breath.   Yes [provider]  amiodarone  (PACERONE ) 200 MG tablet Take 1 tablet (200 mg total) by mouth daily. 04/20/24  Yes Riddle, Suzann, NP  diazepam  (VALIUM ) 2 MG tablet Take 1 tablet (2 mg total) by mouth 2 (two) times daily as needed for anxiety. Home med. 04/01/24  Yes Awanda City, MD  dibucaine (NUPERCAINAL) 1 % OINT Place 1 Application rectally as needed for hemorrhoids.   Yes [provider]  dorzolamide  (TRUSOPT ) 2 % ophthalmic solution Place 1 drop into both eyes 2 (two) times daily.   Yes [provider]  famotidine  (PEPCID ) 40 MG tablet Take 40 mg by mouth at bedtime. 02/12/24  Yes [provider]  latanoprost  (XALATAN ) 0.005 % ophthalmic solution 1 drop at bedtime.   Yes [provider]  losartan  (COZAAR ) 25 MG tablet Take  1 tablet (25 mg total) by mouth daily. Patient taking differently: Take 25 mg by mouth at bedtime. 04/01/24  Yes Awanda City, MD  meclizine  (ANTIVERT ) 25 MG tablet Take 25 mg by mouth 3 (three) times daily as needed for dizziness.   Yes [provider]  metoprolol  tartrate (LOPRESSOR ) 25 MG tablet Take 0.5 tablets (12.5 mg total) by mouth 2 (two) times daily. Patient taking  differently: Take 25 mg by mouth 2 (two) times daily. 04/01/24  Yes Awanda City, MD  ondansetron  (ZOFRAN ) 4 MG tablet Take 4 mg by mouth every 8 (eight) hours as needed for nausea or vomiting.   Yes [provider]  rivaroxaban  (XARELTO ) 20 MG TABS tablet Take 1 tablet (20 mg total) by mouth daily. 04/20/24  Yes Riddle, Suzann, NP  Testosterone 12.5 MG/ACT (1%) GEL Apply 12.5 mg topically daily. 01/05/24  Yes [provider]  Brinzolamide -Brimonidine  1-0.2 % SUSP Apply to eye 2 (two) times daily. Patient not taking: Reported on 06/09/2024    [provider]  colestipol  (COLESTID ) 1 g tablet Take 3 g by mouth 2 (two) times daily. Patient not taking: Reported on 04/20/2024 01/30/24 01/29/25  [provider]  loperamide  (IMODIUM ) 1 MG/5ML solution Take 1 mg by mouth daily as needed for diarrhea or loose stools.    [provider]  Multiple Vitamin (MULTIVITAMIN WITH MINERALS) TABS tablet Take 1 tablet by mouth daily. Patient not taking: Reported on 04/20/2024 04/01/24   Awanda City, MD     Family History  Problem Relation Age of Onset   Hypertension Mother    Kidney disease Sister    Kidney disease Brother    Heart disease Brother    Heart disease Brother    Breast cancer Neg Hx     Social History   Socioeconomic History   Marital status: Married    Spouse name: Not on file   Number of children: Not on file   Years of education: Not on file   Highest education level: Not on file  Occupational History   Not on file  Tobacco Use   Smoking status: Former    Current packs/day: 0.00    Types: Cigarettes    Quit date: 11/14/1993    Years since quitting: 30.5   Smokeless tobacco: Never  Vaping Use   Vaping status: Never Used  Substance and Sexual Activity   Alcohol use: No   Drug use: No   Sexual activity: Not Currently  Other Topics Concern   Not on file  Social History Narrative   Not on file   Social Drivers of Health   Financial Resource  Strain: Low Risk  (06/01/2024)   Received from [facility]   Overall Financial Resource Strain (CARDIA)    Difficulty of Paying Living Expenses: Not hard at all  Food Insecurity: No Food Insecurity (06/09/2024)   Hunger Vital Sign    Worried About Running Out of Food in the Last Year: Never true    Ran Out of Food in the Last Year: Never true  Transportation Needs: No Transportation Needs (06/09/2024)   PRAPARE - Administrator, Civil Service (Medical): No    Lack of Transportation (Non-Medical): No  Physical Activity: Not on file  Stress: Not on file  Social Connections: Socially Integrated (06/09/2024)   Social Connection and Isolation Panel    Frequency of Communication with Friends and Family: More than three times a week    Frequency of Social Gatherings  with Friends and Family: More than three times a week    Attends Religious Services: 1 to 4 times per year    Active Member of Clubs or Organizations: No    Attends Engineer, structural: More than 4 times per year    Marital Status: Married  Recent Concern: Social Connections - Moderately Isolated (03/26/2024)   Social Connection and Isolation Panel    Frequency of Communication with Friends and Family: More than three times a week    Frequency of Social Gatherings with Friends and Family: More than three times a week    Attends Religious Services: More than 4 times per year    Active Member of [employer] or Organizations: No    Attends Banker Meetings: Never    Marital Status: Never married     Review of Systems: A 12 point ROS discussed and pertinent positives are indicated in the HPI above.  All other systems are negative.   Vital Signs: BP 133/66 (BP Location: Left Arm)   Pulse 62   Temp (!) 97.5 F (36.4 C) (Oral)   Resp 18   Ht 5' (1.524 m)   Wt 257 lb 4.4 oz (116.7 kg)   SpO2 100%   BMI 50.25 kg/m     Physical Exam Constitutional:      Appearance: She is obese.   Eyes:     General: Scleral icterus present.  Cardiovascular:     Rate and Rhythm: Normal rate.     Heart sounds: Normal heart sounds.  Pulmonary:     Effort: Pulmonary effort is normal.     Comments: Diminished bilateral Abdominal:     General: Bowel sounds are normal.     Palpations: Abdomen is soft.     Tenderness: There is abdominal tenderness in the right upper quadrant and epigastric area.     Comments: Old, well healed RUQ surgical scars  Skin:    General: Skin is warm and dry.     Comments: No rash or wound over planned puncture  Neurological:     Mental Status: She is alert and oriented to person, place, and time.  Psychiatric:        Mood and Affect: Mood normal.        Behavior: Behavior normal.     Imaging: CT ABDOMEN PELVIS W CONTRAST Result Date: 06/08/2024 CLINICAL DATA:  Acute abdominal pain EXAM: CT ABDOMEN AND PELVIS WITH CONTRAST TECHNIQUE: Multidetector CT imaging of the abdomen and pelvis was performed using the standard protocol following bolus administration of intravenous contrast. RADIATION DOSE REDUCTION: This exam was performed according to the departmental dose-optimization program which includes automated exposure control, adjustment of the mA and/or kV according to patient size and/or use of iterative reconstruction technique. CONTRAST:  OMNIPAQUE  IOHEXOL  350 MG/ML SOLN COMPARISON:  CT abdomen and pelvis 12/25/2023 FINDINGS: Lower chest: No acute abnormality. Hepatobiliary: Gallbladder surgically absent. There is moderate intrahepatic biliary ductal dilatation which has increased from prior. Common bile duct is also moderately dilated, increased from prior. There are scattered rounded hypodensities in the liver which are too small to characterize and unchanged. No new focal liver lesions are identified. Pancreas: Soft tissue mass in the region of the pancreatic head and uncinate process is increased in size now measuring 3.6 x 5.8 cm. Pancreatic body and  tail are within normal limits. There is mild inflammation surrounding the pancreatic head. Spleen: Normal in size without focal abnormality. Adrenals/Urinary Tract: Right renal mass measures 11.2 by  9.2 by 9.7 cm and has increased in size. This abuts the adjacent duodenum with loss of fat plane, progressed from prior. There is dilatation of the right superior pole calyx likely secondary to compression from renal mass. There is no left-sided hydronephrosis. Bilateral renal cysts are stable. There is no perinephric fat stranding. The adrenal glands and bladder are within normal limits. Stomach/Bowel: Stomach is within normal limits. Appendix appears normal. No evidence of bowel wall thickening, distention, or inflammatory changes. There is sigmoid colon diverticulosis. There are postsurgical changes in the stomach. There is loss of fat plane between the pancreatic mass in the gastric antrum and duodenum. Vascular/Lymphatic: Aorta and IVC are normal in size. No enlarged lymph nodes are identified. Reproductive: Status post hysterectomy. No adnexal masses. Other: No abdominal wall hernia or abnormality. No abdominopelvic ascites. Musculoskeletal: No acute or significant osseous findings. IMPRESSION: 1. Interval increase in size of right renal mass worrisome for renal cell carcinoma. 2. Interval increase in size of pancreatic head mass worrisome for metastatic disease or primary pancreatic neoplasm. 3. Interval increase in intra and extrahepatic biliary ductal dilatation likely secondary to obstruction at the level of the pancreatic head mass. 4. Loss of fat plane between the pancreatic mass and gastric antrum and duodenum worrisome for invasion. 5. Mild inflammation surrounding the pancreatic head worrisome for pancreatitis. 6. Diverticulosis. Electronically Signed   By: Greig Pique M.D.   On: 06/08/2024 22:46    Labs:  CBC: Recent Labs    03/29/24 0331 03/30/24 0411 06/08/24 1628 06/09/24 0303  WBC 7.8  5.4 6.6 6.5  HGB 10.6* 10.5* 12.4 12.1  HCT 30.7* 31.5* 38.0 36.5  PLT 184 175 209 179    COAGS: Recent Labs    03/27/24 2057 03/27/24 2104  INR 1.2  --   APTT  --  33    BMP: Recent Labs    03/30/24 0411 03/31/24 0446 06/08/24 1628 06/09/24 0303  NA 146* 140 141 142  K 3.7 3.6 3.0* 3.1*  CL 113* 111 104 106  CO2 25 20* 24 25  GLUCOSE 114* 100* 123* 116*  BUN 29* 23 12 12   CALCIUM 9.3 9.1 9.3 9.1  CREATININE 1.11* 0.96 0.82 0.60  GFRNONAA 54* >60 >60 >60    LIVER FUNCTION TESTS: Recent Labs    12/25/23 1331 03/27/24 2104 06/08/24 1628 06/09/24 0303  BILITOT 0.5 0.7 9.8* 10.3*  AST 28 19 140* 130*  ALT 27 11 112* 106*  ALKPHOS 65 67 450* 446*  PROT 6.9 7.2 6.3* 6.4*  ALBUMIN 3.5 3.4* 2.8* 2.8*    TUMOR MARKERS: No results for input(s): AFPTM, CEA, CA199, CHROMGRNA in the last 8760 hours.  Assessment and Plan:  Request for  image guided PTC with PBD placement approved by Dr. Karalee for 8/13. No contraindications for procedure identified in ROS, physical exam, or review of pre-sedation considerations. Bili 10.3. AST 130, ALT 106. CBC within acceptable limits. INR pending.  8/12 CT abd/pelv  imaging available and reviewed VSS, afebrile Abx - already on flagyl  Never started prescribed AC   Risks and benefits of PTC and PBD placement discussed with the patient including, but not limited to bleeding, infection which may lead to sepsis or even death and damage to adjacent structures. She is aware that the drain will likely be a long term solution and has the potential to be a lifelong need with periodic exchanges.   This interventional procedure involves the use of X-rays and because of the nature of the  planned procedure, it is possible that we will have prolonged use of X-ray fluoroscopy.  Potential radiation risks to you include (but are not limited to) the following: - A slightly elevated risk for cancer  several years later in life. This risk  is typically less than 0.5% percent. This risk is low in comparison to the normal incidence of human cancer, which is 33% for women and 50% for men according to the American [employer] Society. - Radiation induced injury can include skin redness, resembling a rash, tissue breakdown / ulcers and hair loss (which can be temporary or permanent).   The likelihood of either of these occurring depends on the difficulty of the procedure and whether you are sensitive to radiation due to previous procedures, disease, or genetic conditions.   IF your procedure requires a prolonged use of radiation, you will be notified and given written instructions for further action.  It is your responsibility to monitor the irradiated area for the 2 weeks following the procedure and to notify your physician if you are concerned that you have suffered a radiation induced injury.    All of the patient's questions were answered, patient is agreeable to proceed.  Consent signed and in chart.    Thank you for allowing our service to participate in Alyssa Leonard 's care.    Electronically Signed: Laymon Coast, NP   06/09/2024, 12:13 PM     I spent a total of 20 Minutes    in face to face in clinical consultation, greater than 50% of which was counseling/coordinating care for transhepatic cholangiogram with percutaneous biliary tube placement.   (A copy of this note was sent to the referring provider and the time of visit.)

## 2024-06-09 NOTE — Care Management Obs Status (Signed)
 MEDICARE OBSERVATION STATUS NOTIFICATION   Patient Details  Name: Alyssa [facility] MRN: [id number] Date of Birth: 01-Jun-1956   Medicare Observation Status Notification Given:   patient off the floor for a procedure    [name] 06/09/2024, 3:14 PM

## 2024-06-09 NOTE — Assessment & Plan Note (Signed)
 Sliding scale insulin  coverage

## 2024-06-09 NOTE — Procedures (Signed)
 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

## 2024-06-09 NOTE — Assessment & Plan Note (Signed)
 Continue losartan 

## 2024-06-09 NOTE — Consult Note (Signed)
 Alyssa Copping, MD [facility]  [address] [place], [place] [address] Phone: [phone] Fax : [phone]  Consultation  Referring Provider:     Dr. Cleatus Primary Care Physician:  Alyssa [facility] LABOR, MD Primary Gastroenterologist: Alyssa [facility]         Reason for Consultation:     Pancreatic head mass  Date of Admission:  06/08/2024 Date of Consultation:  06/09/2024         HPI:   Alyssa Leonard is a 68 y.o. female with a history of renal cell carcinoma, hypertension with diabetes, morbid obesity atrial flutter on Xarelto  and amiodarone  with obstructive jaundice secondary to what appears to be a pancreatic mass either from a primary source or metastasis.  The patient was admitted with abnormal liver enzymes that showed:  Component     Latest Ref Rng 06/08/2024 06/09/2024  AST     15 - 41 U/L 140 (H)  130 (H)   ALT     0 - 44 U/L 112 (H)  106 (H)   Alkaline Phosphatase     38 - 126 U/L 450 (H)  446 (H)   Total Bilirubin     0.0 - 1.2 mg/dL 9.8 (H)  89.6 (H)    The patient also had a CT scan of the abdomen yesterday that showed:  IMPRESSION: 1. Interval increase in size of right renal mass worrisome for renal cell carcinoma. 2. Interval increase in size of pancreatic head mass worrisome for metastatic disease or primary pancreatic neoplasm. 3. Interval increase in intra and extrahepatic biliary ductal dilatation likely secondary to obstruction at the level of the pancreatic head mass. 4. Loss of fat plane between the pancreatic mass and gastric antrum and duodenum worrisome for invasion. 5. Mild inflammation surrounding the pancreatic head worrisome for pancreatitis. 6. Diverticulosis.  The patient states that she had gastric bypass with a Roux-en-Y 4 years ago.  Past Medical History:  Diagnosis Date   Asthma    Atrial flutter (HCC)    COPD (chronic obstructive pulmonary disease) (HCC)    Diabetes mellitus without complication (HCC)    GERD (gastroesophageal  reflux disease)    History of colon polyps    Hypertension    Meniere disease    OSA (obstructive sleep apnea)    Renal cell carcinoma (HCC)    Sleep apnea    Went for sleep study test in January 2018 I haven't heard back from test    Past Surgical History:  Procedure Laterality Date   ABDOMINAL HYSTERECTOMY     BARIATRIC SURGERY     CHOLECYSTECTOMY     COLONOSCOPY WITH PROPOFOL  N/A 12/11/2016   Procedure: COLONOSCOPY WITH PROPOFOL ;  Surgeon: Alyssa [facility] Holmes, MD;  Location: [facility] END[facility]COPY;  Service: Endoscopy;  Laterality: N/A;   COLONOSCOPY WITH PROPOFOL  N/A 11/27/2021   Procedure: COLONOSCOPY WITH PROPOFOL ;  Surgeon: Alyssa Ole ONEIDA, MD;  Location: [facility] ENDOSCOPY;  Service: Endoscopy;  Laterality: N/A;    Prior to Admission medications   Medication Sig Start Date End Date Taking? Authorizing Provider  acetaminophen  (TYLENOL ) 500 MG tablet Take 1,000 mg by mouth every 6 (six) hours as needed for mild pain (pain score 1-3).   Yes [provider]  albuterol  (PROVENTIL  HFA;VENTOLIN  HFA) 108 (90 Base) MCG/ACT inhaler Inhale 2 puffs into the lungs every 6 (six) hours as needed for wheezing or shortness of breath.   Yes [provider]  amiodarone  (PACERONE ) 200 MG tablet Take 1  tablet (200 mg total) by mouth daily. 04/20/24  Yes Leonard, Suzann, NP  diazepam  (VALIUM ) 2 MG tablet Take 1 tablet (2 mg total) by mouth 2 (two) times daily as needed for anxiety. Home med. 04/01/24  Yes Alyssa City, MD  dibucaine (NUPERCAINAL) 1 % OINT Place 1 Application rectally as needed for hemorrhoids.   Yes [provider]  dorzolamide  (TRUSOPT ) 2 % ophthalmic solution Place 1 drop into both eyes 2 (two) times daily.   Yes [provider]  famotidine  (PEPCID ) 40 MG tablet Take 40 mg by mouth at bedtime. 02/12/24  Yes [provider]  latanoprost  (XALATAN ) 0.005 % ophthalmic solution 1 drop at bedtime.   Yes [provider]  losartan  (COZAAR ) 25 MG tablet  Take 1 tablet (25 mg total) by mouth daily. Patient taking differently: Take 25 mg by mouth at bedtime. 04/01/24  Yes Alyssa City, MD  meclizine  (ANTIVERT ) 25 MG tablet Take 25 mg by mouth 3 (three) times daily as needed for dizziness.   Yes [provider]  metoprolol  tartrate (LOPRESSOR ) 25 MG tablet Take 0.5 tablets (12.5 mg total) by mouth 2 (two) times daily. Patient taking differently: Take 25 mg by mouth 2 (two) times daily. 04/01/24  Yes Alyssa City, MD  ondansetron  (ZOFRAN ) 4 MG tablet Take 4 mg by mouth every 8 (eight) hours as needed for nausea or vomiting.   Yes [provider]  rivaroxaban  (XARELTO ) 20 MG TABS tablet Take 1 tablet (20 mg total) by mouth daily. 04/20/24  Yes Leonard, Suzann, NP  Testosterone 12.5 MG/ACT (1%) GEL Apply 12.5 mg topically daily. 01/05/24  Yes [provider]  Brinzolamide -Brimonidine  1-0.2 % SUSP Apply to eye 2 (two) times daily. Patient not taking: Reported on 06/09/2024    [provider]  colestipol  (COLESTID ) 1 g tablet Take 3 g by mouth 2 (two) times daily. Patient not taking: Reported on 04/20/2024 01/30/24 01/29/25  [provider]  loperamide  (IMODIUM ) 1 MG/5ML solution Take 1 mg by mouth daily as needed for diarrhea or loose stools.    [provider]  Multiple Vitamin (MULTIVITAMIN WITH MINERALS) TABS tablet Take 1 tablet by mouth daily. Patient not taking: Reported on 04/20/2024 04/01/24   Alyssa City, MD    Family History  Problem Relation Age of Onset   Hypertension Mother    Kidney disease Sister    Kidney disease Brother    Heart disease Brother    Heart disease Brother    Breast cancer Neg Hx      Social History   Tobacco Use   Smoking status: Former    Current packs/day: 0.00    Types: Cigarettes    Quit date: 11/14/1993    Years since quitting: 30.5   Smokeless tobacco: Never  Vaping Use   Vaping status: Never Used  Substance Use Topics   Alcohol use: No   Drug use: No    Allergies  as of 06/08/2024 - Review Complete 06/08/2024  Allergen Reaction Noted   Latex Other (See Comments), Dermatitis, Hives, and Swelling 01/05/2018   Aspirin Other (See Comments) and Nausea And Vomiting 08/07/2016    Review of Systems:    All systems reviewed and negative except where noted in HPI.   Physical Exam:  Vital signs in last 24 hours: Temp:  [97.5 F (36.4 C)-98.1 F (36.7 C)] 97.5 F (36.4 C) (08/13 0738) Pulse Rate:  [62-94] 62 (08/13 0738) Resp:  [18-19] 18 (08/13 0738) BP: (133-155)/(66-97) 133/66 (08/13 0738) SpO2:  [  99 %-100 %] 100 % (08/13 0738) Weight:  [116.7 kg] 116.7 kg (08/13 0300) Last BM Date : 06/08/24 General:   Pleasant, cooperative in NAD Head:  Normocephalic and atraumatic. Eyes:   Positive icterus.   Conjunctiva yellow. PERRLA. Ears:  Normal auditory acuity. Neck:  Supple; no masses or thyroidomegaly Lungs: Respirations even and unlabored. Lungs clear to auscultation bilaterally.   No wheezes, crackles, or rhonchi.  Heart:  Regular rate and rhythm;  Without murmur, clicks, rubs or gallops Abdomen:  Soft, nondistended, nontender. Normal bowel sounds. No appreciable masses or hepatomegaly.  No rebound or guarding.  Rectal:  Not performed. Msk:  Symmetrical without gross deformities.    Extremities:  Without edema, cyanosis or clubbing. Neurologic:  Alert and oriented x3;  grossly normal neurologically. Skin:  Intact without significant lesions or rashes. Cervical Nodes:  No significant cervical adenopathy. Psych:  Alert and cooperative. Normal affect.  LAB RESULTS: Recent Labs    06/08/24 1628 06/09/24 0303  WBC 6.6 6.5  HGB 12.4 12.1  HCT 38.0 36.5  PLT 209 179   BMET Recent Labs    06/08/24 1628 06/09/24 0303  NA 141 142  K 3.0* 3.1*  CL 104 106  CO2 24 25  GLUCOSE 123* 116*  BUN 12 12  CREATININE 0.82 0.60  CALCIUM  9.3 9.1   LFT Recent Labs    06/09/24 0303  PROT 6.4*  ALBUMIN 2.8*  AST 130*  ALT 106*  ALKPHOS 446*   BILITOT 10.3*   PT/INR No results for input(s): LABPROT, INR in the last 72 hours.  STUDIES: CT ABDOMEN PELVIS W CONTRAST Result Date: 06/08/2024 CLINICAL DATA:  Acute abdominal pain EXAM: CT ABDOMEN AND PELVIS WITH CONTRAST TECHNIQUE: Multidetector CT imaging of the abdomen and pelvis was performed using the standard protocol following bolus administration of intravenous contrast. RADIATION DOSE REDUCTION: This exam was performed according to the departmental dose-optimization program which includes automated exposure control, adjustment of the mA and/or kV according to patient size and/or use of iterative reconstruction technique. CONTRAST:  OMNIPAQUE  IOHEXOL  350 MG/ML SOLN COMPARISON:  CT abdomen and pelvis 12/25/2023 FINDINGS: Lower chest: No acute abnormality. Hepatobiliary: Gallbladder surgically absent. There is moderate intrahepatic biliary ductal dilatation which has increased from prior. Common bile duct is also moderately dilated, increased from prior. There are scattered rounded hypodensities in the liver which are too small to characterize and unchanged. No new focal liver lesions are identified. Pancreas: Soft tissue mass in the region of the pancreatic head and uncinate process is increased in size now measuring 3.6 x 5.8 cm. Pancreatic body and tail are within normal limits. There is mild inflammation surrounding the pancreatic head. Spleen: Normal in size without focal abnormality. Adrenals/Urinary Tract: Right renal mass measures 11.2 by 9.2 by 9.7 cm and has increased in size. This abuts the adjacent duodenum with loss of fat plane, progressed from prior. There is dilatation of the right superior pole calyx likely secondary to compression from renal mass. There is no left-sided hydronephrosis. Bilateral renal cysts are stable. There is no perinephric fat stranding. The adrenal glands and bladder are within normal limits. Stomach/Bowel: Stomach is within normal limits. Appendix  appears normal. No evidence of bowel wall thickening, distention, or inflammatory changes. There is sigmoid colon diverticulosis. There are postsurgical changes in the stomach. There is loss of fat plane between the pancreatic mass in the gastric antrum and duodenum. Vascular/Lymphatic: Aorta and IVC are normal in size. No enlarged lymph nodes are identified. Reproductive:  Status post hysterectomy. No adnexal masses. Other: No abdominal wall hernia or abnormality. No abdominopelvic ascites. Musculoskeletal: No acute or significant osseous findings. IMPRESSION: 1. Interval increase in size of right renal mass worrisome for renal cell carcinoma. 2. Interval increase in size of pancreatic head mass worrisome for metastatic disease or primary pancreatic neoplasm. 3. Interval increase in intra and extrahepatic biliary ductal dilatation likely secondary to obstruction at the level of the pancreatic head mass. 4. Loss of fat plane between the pancreatic mass and gastric antrum and duodenum worrisome for invasion. 5. Mild inflammation surrounding the pancreatic head worrisome for pancreatitis. 6. Diverticulosis. Electronically Signed   By: Greig Pique M.D.   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) Active Problems:   Renal cell carcinoma (HCC)   Hypertension   Diabetes mellitus without complication (HCC)   COPD (chronic obstructive pulmonary disease) (HCC)   Morbid obesity with BMI of 50.0-59.9, adult (HCC)   OSA (obstructive sleep apnea)   Paroxysmal atrial flutter (HCC)   Mass of head of pancreas   Alyssa [facility] is a 68 y.o. y/o female with status post Roux-en-Y gastric bypass with renal cell mass worrisome for renal cell carcinoma with a pancreatic head mass worrisome for primary pancreatic neoplasm versus metastasis.  There is also ductal dilatation with increased bilirubin.  The patient has had a Roux-en-Y  gastric bypass.  Plan:  This patient will need to be transferred to a tertiary care [facility] since we do not have the means of reach the ampulla for an ERCP.  The patient's options are a spiral endoscopy versus double-balloon enteroscopy to reach the ampulla and then have the ERCP done.  That capability is only at tertiary care centers.  A percutaneous transhepatic drainage can also be entertained.  Nothing further to do from a GI point of view at this time.  I will sign off.  Please call if any further GI concerns or questions.  We would like to thank you for the opportunity to participate in the care of Alyssa [facility].   Thank you for involving me in the care of this patient.      LOS: 0 days   Alyssa Copping, MD, MD. NOLIA 06/09/2024, 9:40 AM,  Pager [phone] 7am-5pm  Check AMION for 5pm -7am coverage and on weekends   Note: This dictation was prepared with Dragon dictation along with smaller phrase technology. Any transcriptional errors that result from this process are unintentional.

## 2024-06-09 NOTE — Progress Notes (Signed)
 Interim progress note not for billing.  I have seen and examined the patient, reviewed the chart, agree with assessment and plan unless stipulated otherwise.  Hx metastatic rcc also known pancreatic mass concerning for met vs primary, followed by [facility] for both, outpt plan for biopsy of this pancreatic mass, now here with epigastric pain and found to have transaminitis and CT showing biliary obstruction 2/2 to this pancreatic mass. Hx roux-en-y and therefore GI here cannot do ERCP would require double balloon procedure. Fortunately IR has evaluated and patient suitable for IR drain placement, is s/p that procedure here.

## 2024-06-09 NOTE — ED Provider Notes (Signed)
 Patient received in signout. Reviewed CT scan which shows what appears to be metastatic disease from her renal cell carcinoma with complications including what appears to be mass obstruction of the biliary ducts causing LFT abnormalities, pancreatitis.  Will be admitted for pain control as well as likely GI consultation for consideration of ERCP stenting of the mass lesion.  Fortunately vital signs been stable she does not appear septic, but given obstructive signs we will prophylactically antibiosis.  Admission to hospitalist service.   Cyrena Mylar, MD 06/09/24 [phone]

## 2024-06-09 NOTE — Assessment & Plan Note (Addendum)
 Holding Xarelto  in case of procedure Currently in sinus rhythm Continue amiodarone  and metoprolol 

## 2024-06-09 NOTE — Assessment & Plan Note (Addendum)
 Mass head of pancreas on CT Likely metastases Supportive care with pain meds, antiemetics GI consult Will keep n.p.o. for possible procedure Hold Xarelto  Consideration can be given to palliative care consult

## 2024-06-09 NOTE — Plan of Care (Signed)
   Problem: Education: Goal: Ability to describe self-care measures that may prevent or decrease complications (Diabetes Survival Skills Education) will improve Outcome: Progressing Goal: Individualized Educational Video(s) Outcome: Progressing   Problem: Coping: Goal: Ability to adjust to condition or change in health will improve Outcome: Progressing   Problem: Fluid Volume: Goal: Ability to maintain a balanced intake and output will improve Outcome: Progressing   Problem: Health Behavior/Discharge Planning: Goal: Ability to identify and utilize available resources and services will improve Outcome: Progressing Goal: Ability to manage health-related needs will improve Outcome: Progressing   Problem: Metabolic: Goal: Ability to maintain appropriate glucose levels will improve Outcome: Progressing   Problem: Nutritional: Goal: Progress toward achieving an optimal weight will improve Outcome: Progressing   Problem: Skin Integrity: Goal: Risk for impaired skin integrity will decrease Outcome: Progressing   Problem: Tissue Perfusion: Goal: Adequacy of tissue perfusion will improve Outcome: Progressing   Problem: Education: Goal: Knowledge of General Education information will improve Description: Including pain rating scale, medication(s)/side effects and non-pharmacologic comfort measures Outcome: Progressing   Problem: Health Behavior/Discharge Planning: Goal: Ability to manage health-related needs will improve Outcome: Progressing   Problem: Clinical Measurements: Goal: Ability to maintain clinical measurements within normal limits will improve Outcome: Progressing Goal: Will remain free from infection Outcome: Progressing Goal: Diagnostic test results will improve Outcome: Progressing Goal: Respiratory complications will improve Outcome: Progressing Goal: Cardiovascular complication will be avoided Outcome: Progressing   Problem: Activity: Goal: Risk for  activity intolerance will decrease Outcome: Progressing   Problem: Coping: Goal: Level of anxiety will decrease Outcome: Progressing   Problem: Elimination: Goal: Will not experience complications related to bowel motility Outcome: Progressing Goal: Will not experience complications related to urinary retention Outcome: Progressing   Problem: Safety: Goal: Ability to remain free from injury will improve Outcome: Progressing   Problem: Skin Integrity: Goal: Risk for impaired skin integrity will decrease Outcome: Progressing

## 2024-06-09 NOTE — Progress Notes (Signed)
 Patient clinically stable post IR Biliary drain placement per Dr Karalee, tolerated well. Required fentanyl  200 mcg along with Versed  2.5 mg IV for procedure. Vitals remained stable pre and post procedure. Report given to Ruthanna Kitty RN post procedure/specials/24

## 2024-06-09 NOTE — Assessment & Plan Note (Signed)
 Morbid obesity with BMI of 50 CPAP nightly Complicating factor to overall prognosis and care

## 2024-06-09 NOTE — Assessment & Plan Note (Deleted)
 Mass head of pancreas on CT Likely metastases Supportive care with pain meds, antiemetics GI consult Will keep n.p.o. for possible procedure Consideration can be given to palliative care consult

## 2024-06-09 NOTE — H&P (Signed)
 History and Physical    Patient: Alyssa Leonard FMW:[id number] DOB: [birth year] DOA: 06/08/2024 DOS: the patient was seen and examined on 06/09/2024 PCP: [name] LABOR, MD  Patient coming from: Home  Chief Complaint:  Chief Complaint  Patient presents with   Abdominal Pain    HPI: Alyssa Leonard is a 68 y.o. female with medical history significant for renal cell carcinoma on Ipilimumab (IPI) and Nivolumab (NIVO) immunotherapy in [facility], hypertension, diet-controlled diabetes, morbid obesity, OSA on CPAP, recently diagnosed a flutter on Xarelto  and amiodarone  being admitted with obstructive jaundice secondary to intra-abdominal metastasis.she presented with 2 weeks of mid and upper abdominal pain and worsening constipation.  She endorses nausea but denies vomiting, fever or chills or dysuria. In the ED, vitals within normal limits Labs notable for normal WBC, and abnormal LFTs with AST/ALT 140/112, alk phos 480 and total bilirubin 9.8.  Potassium was 3.  CBC unremarkable.  UA not consistent with infection. EKG showing NSR at 88. CT abdomen and pelvis with contrast showing evidence of intra-abdominal metastasis , increasing right renal mass, pancreatic head mass with intra and extrahepatic hepatic biliary ductal dilatation among other findings-please see full report  Patient treated with Zofran  and morphine , covered with Rocephin  and Flagyl . Admission requested for consideration of biliary stent placement.     Review of Systems: As mentioned in the history of present illness. All other systems reviewed and are negative.  Past Medical History:  Diagnosis Date   Asthma    Atrial flutter (HCC)    COPD (chronic obstructive pulmonary disease) (HCC)    Diabetes mellitus without complication (HCC)    GERD (gastroesophageal reflux disease)    History of colon polyps    Hypertension    Meniere disease    OSA (obstructive sleep apnea)    Renal cell carcinoma (HCC)    Sleep apnea     Went for sleep study test in January 2018 I haven't heard back from test   Past Surgical History:  Procedure Laterality Date   ABDOMINAL HYSTERECTOMY     BARIATRIC SURGERY     CHOLECYSTECTOMY     COLONOSCOPY WITH PROPOFOL  N/A 12/11/2016   Procedure: COLONOSCOPY WITH PROPOFOL ;  Surgeon: Lamar ONEIDA Holmes, MD;  Location: [facility] ENDOSCOPY;  Service: Endoscopy;  Laterality: N/A;   COLONOSCOPY WITH PROPOFOL  N/A 11/27/2021   Procedure: COLONOSCOPY WITH PROPOFOL ;  Surgeon: Maryruth Ole ONEIDA, MD;  Location: [facility] ENDOSCOPY;  Service: Endoscopy;  Laterality: N/A;   Social History:  reports that she quit smoking about 30 years ago. Her smoking use included cigarettes. She has never used smokeless tobacco. She reports that she does not drink alcohol and does not use drugs.  Allergies  Allergen Reactions   Latex Other (See Comments), Dermatitis, Hives and Swelling    unknown  Other reaction(s): Other (See Comments)  unknown  Other reaction(s): Other (See Comments) unknown    unknown   Aspirin Other (See Comments) and Nausea And Vomiting    Makes stomach burn  Other reaction(s): Other (See Comments)  Makes stomach burn  Makes stomach burn    Other reaction(s): Other (See Comments) Makes stomach burn    Family History  Problem Relation Age of Onset   Hypertension Mother    Kidney disease Sister    Kidney disease Brother    Heart disease Brother    Heart disease Brother    Breast cancer Neg Hx     Prior to Admission medications   Medication Sig Start Date End  Date Taking? Authorizing Provider  acetaminophen  (TYLENOL ) 500 MG tablet Take 1,000 mg by mouth every 6 (six) hours as needed for mild pain (pain score 1-3).    [provider]  albuterol  (PROVENTIL  HFA;VENTOLIN  HFA) 108 (90 Base) MCG/ACT inhaler Inhale 2 puffs into the lungs every 6 (six) hours as needed for wheezing or shortness of breath.    [provider]  amiodarone  (PACERONE ) 200 MG tablet Take 1 tablet  (200 mg total) by mouth daily. 04/20/24   Riddle, Suzann, NP  Brinzolamide -Brimonidine  1-0.2 % SUSP Apply to eye 2 (two) times daily.    [provider]  colestipol  (COLESTID ) 1 g tablet Take 3 g by mouth 2 (two) times daily. Patient not taking: Reported on 06/08/2024 01/30/24 01/29/25  [provider]  diazepam  (VALIUM ) 2 MG tablet Take 1 tablet (2 mg total) by mouth 2 (two) times daily as needed for anxiety. Home med. 04/01/24   Awanda City, MD  dibucaine (NUPERCAINAL) 1 % OINT Place 1 Application rectally as needed for hemorrhoids.    [provider]  dorzolamide  (TRUSOPT ) 2 % ophthalmic solution Place 1 drop into both eyes 2 (two) times daily.    [provider]  famotidine  (PEPCID ) 40 MG tablet Take 40 mg by mouth at bedtime. 02/12/24   [provider]  latanoprost  (XALATAN ) 0.005 % ophthalmic solution 1 drop at bedtime.    [provider]  loperamide  (IMODIUM ) 1 MG/5ML solution Take 1 mg by mouth daily as needed for diarrhea or loose stools.    [provider]  losartan  (COZAAR ) 25 MG tablet Take 1 tablet (25 mg total) by mouth daily. 04/01/24   Awanda City, MD  meclizine  (ANTIVERT ) 25 MG tablet Take 25 mg by mouth 3 (three) times daily as needed for dizziness.    [provider]  metoprolol  tartrate (LOPRESSOR ) 25 MG tablet Take 0.5 tablets (12.5 mg total) by mouth 2 (two) times daily. Patient taking differently: Take 25 mg by mouth 2 (two) times daily. 04/01/24   Awanda City, MD  Multiple Vitamin (MULTIVITAMIN WITH MINERALS) TABS tablet Take 1 tablet by mouth daily. Patient not taking: Reported on 06/08/2024 04/01/24   Awanda City, MD  ondansetron  (ZOFRAN ) 4 MG tablet Take 4 mg by mouth every 8 (eight) hours as needed for nausea or vomiting.    [provider]  rivaroxaban  (XARELTO ) 20 MG TABS tablet Take 1 tablet (20 mg total) by mouth daily. Patient not taking: Reported on 06/08/2024 04/20/24   Riddle, Suzann, NP  Testosterone 12.5  MG/ACT (1%) GEL Apply 12.5 mg topically daily. 01/05/24   [provider]    Physical Exam: Vitals:   06/08/24 1620 06/08/24 1625 06/08/24 2215  BP:  (!) 151/97 (!) 151/79  Pulse: 94  81  Resp:  19 18  Temp: 98.1 F (36.7 C)  97.7 F (36.5 C)  TempSrc: Oral  Oral  SpO2: 100%  100%   Physical Exam Vitals and nursing note reviewed.  Constitutional:      General: She is not in acute distress. HENT:     Head: Normocephalic and atraumatic.  Cardiovascular:     Rate and Rhythm: Normal rate and regular rhythm.     Heart sounds: Normal heart sounds.  Pulmonary:     Effort: Pulmonary effort is normal.     Breath sounds: Normal breath sounds.  Abdominal:     Palpations: Abdomen is soft.     Tenderness: There is no abdominal tenderness.  Neurological:  Mental Status: Mental status is at baseline.     Labs on Admission: I have personally reviewed following labs and imaging studies  CBC: Recent Labs  Lab 06/08/24 1628  WBC 6.6  NEUTROABS 4.2  HGB 12.4  HCT 38.0  MCV 86.0  PLT 209   Basic Metabolic Panel: Recent Labs  Lab 06/08/24 1628  NA 141  K 3.0*  CL 104  CO2 24  GLUCOSE 123*  BUN 12  CREATININE 0.82  CALCIUM  9.3   GFR: Estimated Creatinine Clearance: 76.7 mL/min (by C-G formula based on SCr of 0.82 mg/dL). Liver Function Tests: Recent Labs  Lab 06/08/24 1628  AST 140*  ALT 112*  ALKPHOS 450*  BILITOT 9.8*  PROT 6.3*  ALBUMIN 2.8*   Recent Labs  Lab 06/08/24 1628  LIPASE 27   No results for input(s): AMMONIA in the last 168 hours. Coagulation Profile: No results for input(s): INR, PROTIME in the last 168 hours. Cardiac Enzymes: No results for input(s): CKTOTAL, CKMB, CKMBINDEX, TROPONINI in the last 168 hours. BNP (last 3 results) No results for input(s): PROBNP in the last 8760 hours. HbA1C: No results for input(s): HGBA1C in the last 72 hours. CBG: No results for input(s): GLUCAP in the last 168  hours. Lipid Profile: No results for input(s): CHOL, HDL, LDLCALC, TRIG, CHOLHDL, LDLDIRECT in the last 72 hours. Thyroid  Function Tests: No results for input(s): TSH, T4TOTAL, FREET4, T3FREE, THYROIDAB in the last 72 hours. Anemia Panel: No results for input(s): VITAMINB12, FOLATE, FERRITIN, TIBC, IRON, RETICCTPCT in the last 72 hours. Urine analysis:    Component Value Date/Time   COLORURINE AMBER (A) 06/08/2024 2040   APPEARANCEUR HAZY (A) 06/08/2024 2040   LABSPEC 1.015 06/08/2024 2040   PHURINE 5.0 06/08/2024 2040   GLUCOSEU NEGATIVE 06/08/2024 2040   HGBUR NEGATIVE 06/08/2024 2040   BILIRUBINUR SMALL (A) 06/08/2024 2040   KETONESUR 5 (A) 06/08/2024 2040   PROTEINUR 30 (A) 06/08/2024 2040   NITRITE NEGATIVE 06/08/2024 2040   LEUKOCYTESUR NEGATIVE 06/08/2024 2040    Radiological Exams on Admission: CT ABDOMEN PELVIS W CONTRAST Result Date: 06/08/2024 CLINICAL DATA:  Acute abdominal pain EXAM: CT ABDOMEN AND PELVIS WITH CONTRAST TECHNIQUE: Multidetector CT imaging of the abdomen and pelvis was performed using the standard protocol following bolus administration of intravenous contrast. RADIATION DOSE REDUCTION: This exam was performed according to the departmental dose-optimization program which includes automated exposure control, adjustment of the mA and/or kV according to patient size and/or use of iterative reconstruction technique. CONTRAST:  OMNIPAQUE  IOHEXOL  350 MG/ML SOLN COMPARISON:  CT abdomen and pelvis 12/25/2023 FINDINGS: Lower chest: No acute abnormality. Hepatobiliary: Gallbladder surgically absent. There is moderate intrahepatic biliary ductal dilatation which has increased from prior. Common bile duct is also moderately dilated, increased from prior. There are scattered rounded hypodensities in the liver which are too small to characterize and unchanged. No new focal liver lesions are identified. Pancreas: Soft tissue mass in the  region of the pancreatic head and uncinate process is increased in size now measuring 3.6 x 5.8 cm. Pancreatic body and tail are within normal limits. There is mild inflammation surrounding the pancreatic head. Spleen: Normal in size without focal abnormality. Adrenals/Urinary Tract: Right renal mass measures 11.2 by 9.2 by 9.7 cm and has increased in size. This abuts the adjacent duodenum with loss of fat plane, progressed from prior. There is dilatation of the right superior pole calyx likely secondary to compression from renal mass. There is no left-sided hydronephrosis. Bilateral renal  cysts are stable. There is no perinephric fat stranding. The adrenal glands and bladder are within normal limits. Stomach/Bowel: Stomach is within normal limits. Appendix appears normal. No evidence of bowel wall thickening, distention, or inflammatory changes. There is sigmoid colon diverticulosis. There are postsurgical changes in the stomach. There is loss of fat plane between the pancreatic mass in the gastric antrum and duodenum. Vascular/Lymphatic: Aorta and IVC are normal in size. No enlarged lymph nodes are identified. Reproductive: Status post hysterectomy. No adnexal masses. Other: No abdominal wall hernia or abnormality. No abdominopelvic ascites. Musculoskeletal: No acute or significant osseous findings. IMPRESSION: 1. Interval increase in size of right renal mass worrisome for renal cell carcinoma. 2. Interval increase in size of pancreatic head mass worrisome for metastatic disease or primary pancreatic neoplasm. 3. Interval increase in intra and extrahepatic biliary ductal dilatation likely secondary to obstruction at the level of the pancreatic head mass. 4. Loss of fat plane between the pancreatic mass and gastric antrum and duodenum worrisome for invasion. 5. Mild inflammation surrounding the pancreatic head worrisome for pancreatitis. 6. Diverticulosis. Electronically Signed   By: Greig Pique M.D.   On:  06/08/2024 22:46   Data Reviewed for HPI: Relevant notes from primary care and specialist visits, past discharge summaries as available in EHR, including Care Everywhere. 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 for possible procedure Hold Xarelto  Consideration can be given to palliative care consult  Paroxysmal atrial flutter (HCC) Holding Xarelto  in case of procedure Currently in sinus rhythm Continue amiodarone  and metoprolol   Hypertension Continue losartan   COPD (chronic obstructive pulmonary disease) (HCC) Not acutely exacerbated DuoNebs as needed  Diabetes mellitus without complication (HCC) Sliding scale insulin  coverage  OSA (obstructive sleep apnea) Morbid obesity with BMI of 50 CPAP nightly Complicating factor to overall prognosis and care        DVT prophylaxis: SCD  Consults: GI Dr Maryruth  Advance Care Planning:   Code Status: Prior   Family Communication: none  Disposition Plan: Back to previous home environment  Severity of Illness: The appropriate patient status for this patient is OBSERVATION. Observation status is judged to be reasonable and necessary in order to provide the required intensity of service to ensure the patient's safety. The patient's presenting symptoms, physical exam findings, and initial radiographic and laboratory data in the context of their medical condition is felt to place them at decreased risk for further clinical deterioration. Furthermore, it is  anticipated that the patient will be medically stable for discharge from the hospital within 2 midnights of admission.   Author: Delayne LULLA Solian, MD 06/09/2024 1:41 AM  For on call review [email or web address].

## 2024-06-10 DIAGNOSIS — K831 Obstruction of bile duct: Secondary | ICD-10-CM | POA: Diagnosis not present

## 2024-06-10 DIAGNOSIS — C801 Malignant (primary) neoplasm, unspecified: Secondary | ICD-10-CM | POA: Diagnosis not present

## 2024-06-10 LAB — GLUCOSE, CAPILLARY
Glucose-Capillary: 109 mg/dL — ABNORMAL HIGH (ref 70–99)
Glucose-Capillary: 124 mg/dL — ABNORMAL HIGH (ref 70–99)
Glucose-Capillary: 164 mg/dL — ABNORMAL HIGH (ref 70–99)
Glucose-Capillary: 86 mg/dL (ref 70–99)
Glucose-Capillary: 87 mg/dL (ref 70–99)

## 2024-06-10 LAB — CBC
HCT: 30 % — ABNORMAL LOW (ref 36.0–46.0)
Hemoglobin: 9.9 g/dL — ABNORMAL LOW (ref 12.0–15.0)
MCH: 28.3 pg (ref 26.0–34.0)
MCHC: 33 g/dL (ref 30.0–36.0)
MCV: 85.7 fL (ref 80.0–100.0)
Platelets: 141 K/uL — ABNORMAL LOW (ref 150–400)
RBC: 3.5 MIL/uL — ABNORMAL LOW (ref 3.87–5.11)
RDW: 16.7 % — ABNORMAL HIGH (ref 11.5–15.5)
WBC: 6.8 K/uL (ref 4.0–10.5)
nRBC: 0 % (ref 0.0–0.2)

## 2024-06-10 LAB — COMPREHENSIVE METABOLIC PANEL WITH GFR
ALT: 77 U/L — ABNORMAL HIGH (ref 0–44)
AST: 78 U/L — ABNORMAL HIGH (ref 15–41)
Albumin: 2.4 g/dL — ABNORMAL LOW (ref 3.5–5.0)
Alkaline Phosphatase: 349 U/L — ABNORMAL HIGH (ref 38–126)
Anion gap: 11 (ref 5–15)
BUN: 15 mg/dL (ref 8–23)
CO2: 24 mmol/L (ref 22–32)
Calcium: 8.7 mg/dL — ABNORMAL LOW (ref 8.9–10.3)
Chloride: 108 mmol/L (ref 98–111)
Creatinine, Ser: 0.7 mg/dL (ref 0.44–1.00)
GFR, Estimated: >60 mL/min (ref >60)
Glucose, Bld: 95 mg/dL (ref 70–99)
Potassium: 3.7 mmol/L (ref 3.5–5.1)
Sodium: 143 mmol/L (ref 135–145)
Total Bilirubin: 6.7 mg/dL — ABNORMAL HIGH (ref 0.0–1.2)
Total Protein: 5.2 g/dL — ABNORMAL LOW (ref 6.5–8.1)

## 2024-06-10 LAB — HEMOGLOBIN: Hemoglobin: 10.3 g/dL — ABNORMAL LOW (ref 12.0–15.0)

## 2024-06-10 LAB — LIPASE, BLOOD: Lipase: 23 U/L (ref 11–51)

## 2024-06-10 MED ORDER — ADULT MULTIVITAMIN W/MINERALS CH
1.0000 | ORAL_TABLET | Freq: Two times a day (BID) | ORAL | Status: DC
  Administered 2024-06-10 – 2024-06-11 (×2): 1 via ORAL
  Filled 2024-06-10 (×2): qty 1

## 2024-06-10 MED ORDER — ENSURE PLUS HIGH PROTEIN PO LIQD
237.0000 mL | Freq: Two times a day (BID) | ORAL | Status: DC
  Administered 2024-06-10 – 2024-06-11 (×2): 237 mL via ORAL

## 2024-06-10 MED ORDER — BISACODYL 5 MG PO TBEC
5.0000 mg | DELAYED_RELEASE_TABLET | Freq: Every day | ORAL | Status: DC | PRN
  Administered 2024-06-10: 5 mg via ORAL
  Filled 2024-06-10 (×2): qty 1

## 2024-06-10 MED ORDER — SODIUM CHLORIDE FLUSH 0.9 % IV SOLN: 10.0000 mL | INTRAVENOUS | 1 refills | Status: DC | PRN

## 2024-06-10 MED ORDER — CALCIUM CARBONATE ANTACID 500 MG PO CHEW
1.0000 | CHEWABLE_TABLET | Freq: Three times a day (TID) | ORAL | Status: DC
  Administered 2024-06-10 – 2024-06-11 (×2): 200 mg via ORAL
  Filled 2024-06-10 (×2): qty 1

## 2024-06-10 NOTE — Telephone Encounter (Signed)
 Copied from CRM #[id number]. Topic: Care Management - Discuss Care Plan >> Jun 10, 2024  2:51 PM Alyssa Leonard wrote:   Alyssa, Leonard contacted the [employer] requesting to speak with the care team of Alyssa Leonard to discuss:  -Calling to discuss medication  Methocarbamol 500mg  that was given to her when she was discharged. -Requesting for [name] to call her to discuss if she should continue taking it  Please contact Alyssa Leonard  at [phone].  Thank you,  Alyssa Leonard  [phone]

## 2024-06-10 NOTE — Discharge Instructions (Addendum)
 Interventional Radiology Percutaneous Drain Placement After Care  This sheet gives you information about how to care for yourself after your procedure. Your health care provider may also give you more specific instructions. Your drain was placed by an interventional radiologist with [facility]. If you have questions or concerns, contact [facility] Radiology at [phone]. What is a percutaneous drain? A drain is a small plastic tube (catheter) that goes into the fluid collection in your body through your skin. How long will I need the drain? How long the drain needs to stay in is determined by where the drain is, how much comes out of the drain each day and if you are having any other surgical procedures. Interventional radiology will determine when it is time to remove the drain. It is important to follow up as directed so that the drain can be removed as soon as it is safe to do so. What can I expect after the procedure? After the procedure, it is common to have: A small amount of bruising and discomfort in the area where the drainage tube (catheter) was placed. Sleepiness and fatigue. This should go away after the medicines you were given have worn off. Follow these instructions at home: Insertion site care Check your insertion site when you change the bandage. Check for: More redness, swelling, or pain. More fluid or blood. Warmth. Pus or a bad smell. When caring for your insertion site: Wash your hands with soap and water  for at least 20 seconds before and after you change your bandage (dressing). If soap and water  are not available, use hand sanitizer. You do not need to change your dressing everyday if it is clean and dry. Change your dressing every 3 days or as needed when it is soiled, wet or becoming dislodged. You will need to change your dressing each time you shower. Leave stitches (sutures), skin glue, or adhesive strips in place. These skin closures may need to stay  in place for 2 weeks or longer. If adhesive strip edges start to loosen and curl up, you may trim the loose edges. Do not remove adhesive strips completely unless your health care provider tells you to do so.  Catheter care Flush the catheter once per day with 5 mL of 0.9% normal saline unless you are told otherwise by your healthcare provider. This helps to prevent clogs in the catheter. To disconnect the drain, turn the clear plastic tube to the left. Attach the saline syringe by placing it on the white end of the drain and turning gently to the right. Once attached gently push the plunger to the 5 mL mark. After you are done flushing, disconnect the syringe by turning to the left and reattach your drainage container      Check for fluid leaking from around your catheter (instead of fluid draining through your catheter). This may be a sign that the drain is no longer working correctly. Write down the following information every time you empty your bag: The date and time. The amount of drainage. Activity Rest at home for 1-2 days after your procedure. For the first 48 hours do not lift anything more than 10 lbs (about a gallon of milk). You may perform moderate activities/exercise. Please avoid strenuous activities during this time. Avoid any activities which may pull on your drain as this can cause your drain to become dislodged. If you were given a sedative during the procedure, it can affect you for several hours. Do not drive or operate  machinery until your health care provider says that it is safe. General instructions For mild pain take over-the-counter medications as needed for pain such as Tylenol  or Advil. If you are experiencing severe pain please call our office as this may indicate an issue with your drain.  If you were prescribed an antibiotic medicine, take it as told by your health care provider. Do not stop using the antibiotic even if you start to feel better. You may shower  24 hours after the drain is placed. To do this cover the insertion site with a water  tight material such as saran wrap and seal the edges with tape, you may also purchase waterproof dressings at your local drug store. Shower as usual and then remove the water  tight dressing and any gauze/tape underneath it once you have exited the shower and dried off. Allow the area to air dry or pat dry with a clean towel. Once the skin is completely dry place a new gauze dressing. It is important to keep the site dry at all times to prevent infection. Do not submerge the drain - this means you cannot take baths, swim, use a hot tub, etc. until the drain is removed.  Do not use any products that contain nicotine or tobacco, such as cigarettes, e-cigarettes, and chewing tobacco. If you need help quitting, ask your health care provider. Keep all follow-up visits as told by your health care provider. This is important. Contact a health care provider if: You are not able to flush the catheter. You have any of these signs of infection: More redness, swelling, or pain around your incision area. More fluid or blood coming from your incision area. Warmth coming from your incision area. Pus or a bad smell coming from your incision area. You have fluid leaking from around your catheter (instead of through your catheter). You have a fever or chills. You have pain that does not get better with medicine. You have not been contacted to schedule a drain follow up appointment within 10 days of discharge from the hospital.  Uncap your drain and reattach to a drainage bag if: You start to develop worsening abdominal pain You develop nausea/vomiting You develop fever/chills  Please call [facility] Radiology at [phone] with any questions or concerns. Get help right away if: Your catheter comes out. You suddenly have blood in the fluid that is draining from your catheter You are unable to flush the catheter You become  dizzy or you faint. You develop a rash. You have nausea or vomiting. You have difficulty breathing or you feel short of breath. You develop chest pain. You have problems with your speech or vision. You have trouble balancing or moving your arms or legs.  Summary It is common to have a small amount of bruising and discomfort in the area where the drainage tube (catheter) was placed. You may also have minor discomfort with movement while the drain is in place. Flush the drain once per day with 5 mL of 0.9% normal saline (unless you were told otherwise by your healthcare provider).  Record the amount of drainage from the bag every time you empty it. Change the dressing every 3 days or earlier if soiled/wet. Keep the skin dry under the dressing. You may shower with the drain in place. Do not submerge the drain (no baths, swimming, hot tubs, etc.). Reattach the catheter to a drainage bag if you develop fever/chills, nausea/vomiting, or worsening abdominal pain Contact [facility] Radiology at [phone] if  you have more redness, swelling, or pain around your incision area or if you have pain that does not get better with medicine. This information is not intended to replace advice given to you by your health care provider. Make sure you discuss any questions you have with your health care provider. Document Revised: 01/17/2022 Document Reviewed: 10/09/2019 Elsevier Patient Education  2023 [employer] Inc.    Interventional Radiology Drain Record Empty your drain at least once per day. You may empty it as often as needed. Use this form to write down the amount of fluid that has collected in the drainage container. Bring this form with you to your follow-up visits. Please call [facility] Radiology at [phone] with any questions or concerns prior to your appointment.  Drain #1 location: ___________________ Date __________ Time __________ Amount __________ Date __________ Time __________  Amount __________ Date __________ Time __________ Amount __________ Date __________ Time __________ Amount __________ Date __________ Time __________ Amount __________ Date __________ Time __________ Amount __________ Date __________ Time __________ Amount __________ Date __________ Time __________ Amount __________ Date __________ Time __________ Amount __________ Date __________ Time __________ Amount __________ Date __________ Time __________ Amount __________ Date __________ Time __________ Amount __________ Date __________ Time __________ Amount __________ Date __________ Time __________ Amount __________  For your constipation, you may take MiraLax  or Dulcolax per instructions on the bottle.

## 2024-06-10 NOTE — Plan of Care (Signed)
   Problem: Education: Goal: Knowledge of General Education information will improve Description Including pain rating scale, medication(s)/side effects and non-pharmacologic comfort measures Outcome: Progressing

## 2024-06-10 NOTE — Progress Notes (Signed)
 Referring Provider(s): Dr. Devaughn Ban   Supervising Physician: Hughes Simmonds  Patient Status:  Alyssa Leonard - In-pt  Chief Complaint: Obstructive Jaundice s/p biliary drain placement seen for drain care follow up  Brief History:  68 year old female with a history of metastatic RCC and roux-en-Y who presented to the ED on 8/12 with abdominal pain and was subsequently admitted for obstructive jaundice secondary to intra-abdominal metastases. CT revealed interval increase in size of pancreatic head mass and increase in intra and extrahepatic biliary ductal dilatation. GI initially consulted, but deemed inappropriate candidate for ERCP given her history of roux-en-Y. IR subsequently consulted for cholangiogram and biliary drain placement which she underwent on 8/13 with Dr. VEAR Lent.   Subjective:  Patient is resting in bed in no acute distress. Reports some persistent epigastric discomfort and nausea, but denies any pain at the drain site. Asked if her urine will start to lighten in color now that drain's in place.  Allergies: Latex, Aspirin, and Plasticized base [plastibase]  Medications: Prior to Admission medications   Medication Sig Start Date End Date Taking? Authorizing Provider  acetaminophen  (TYLENOL ) 500 MG tablet Take 1,000 mg by mouth every 6 (six) hours as needed for mild pain (pain score 1-3).   Yes [provider]  albuterol  (PROVENTIL  HFA;VENTOLIN  HFA) 108 (90 Base) MCG/ACT inhaler Inhale 2 puffs into the lungs every 6 (six) hours as needed for wheezing or shortness of breath.   Yes [provider]  amiodarone  (PACERONE ) 200 MG tablet Take 1 tablet (200 mg total) by mouth daily. 04/20/24  Yes Riddle, Suzann, NP  diazepam  (VALIUM ) 2 MG tablet Take 1 tablet (2 mg total) by mouth 2 (two) times daily as needed for anxiety. Home med. 04/01/24  Yes Awanda City, MD  dibucaine (NUPERCAINAL) 1 % OINT Place 1 Application rectally as needed for hemorrhoids.   Yes [provider]  dorzolamide  (TRUSOPT ) 2 % ophthalmic solution Place 1 drop into both eyes 2 (two) times daily.   Yes [provider]  famotidine  (PEPCID ) 40 MG tablet Take 40 mg by mouth at bedtime. 02/12/24  Yes [provider]  latanoprost  (XALATAN ) 0.005 % ophthalmic solution 1 drop at bedtime.   Yes [provider]  losartan  (COZAAR ) 25 MG tablet Take 1 tablet (25 mg total) by mouth daily. Patient taking differently: Take 25 mg by mouth at bedtime. 04/01/24  Yes Awanda City, MD  meclizine  (ANTIVERT ) 25 MG tablet Take 25 mg by mouth 3 (three) times daily as needed for dizziness.   Yes [provider]  metoprolol  tartrate (LOPRESSOR ) 25 MG tablet Take 0.5 tablets (12.5 mg total) by mouth 2 (two) times daily. Patient taking differently: Take 25 mg by mouth 2 (two) times daily. 04/01/24  Yes Awanda City, MD  ondansetron  (ZOFRAN ) 4 MG tablet Take 4 mg by mouth every 8 (eight) hours as needed for nausea or vomiting.   Yes [provider]  rivaroxaban  (XARELTO ) 20 MG TABS tablet Take 1 tablet (20 mg total) by mouth daily. 04/20/24  Yes Riddle, Suzann, NP  Testosterone 12.5 MG/ACT (1%) GEL Apply 12.5 mg topically daily. 01/05/24  Yes [provider]  Brinzolamide -Brimonidine  1-0.2 % SUSP Apply to eye 2 (two) times daily. Patient not taking: Reported on 06/09/2024    [provider]  colestipol  (COLESTID ) 1 g tablet Take 3 g by mouth 2 (two) times daily. Patient not taking: Reported on 04/20/2024 01/30/24 01/29/25  [provider]  loperamide  (IMODIUM ) 1 MG/5ML solution Take  1 mg by mouth daily as needed for diarrhea or loose stools.    [provider]  Multiple Vitamin (MULTIVITAMIN WITH MINERALS) TABS tablet Take 1 tablet by mouth daily. Patient not taking: Reported on 04/20/2024 04/01/24   Awanda City, MD    Vital Signs: BP 134/72 (BP Location: Left Arm)   Pulse 64   Temp 97.8 F (36.6 C) (Oral)   Resp 17   Ht 5' (1.524 m)   Wt 257  lb 4.4 oz (116.7 kg)   SpO2 100%   BMI 50.25 kg/m   Physical Exam Constitutional:      Appearance: She is obese.  Eyes:     General: Scleral icterus present.  Cardiovascular:     Rate and Rhythm: Normal rate.  Pulmonary:     Effort: Pulmonary effort is normal.  Abdominal:     General: Abdomen is flat.     Palpations: Abdomen is soft.     Tenderness: There is abdominal tenderness (epigastric and appropriately tender around drain site).     Comments: RUQ drain sutured and stat locked in place w/ overlying dressing. Insertion site is non-erythematous. Drain to gravity bag with ~100cc blood tinged bile in bag. Catheter with fibrinous blood clot in catheter which was removed. Initially flushed against resistance, but then flushed easily. Patient did report some pain with flushing  Skin:    General: Skin is warm and dry.  Neurological:     Mental Status: She is alert.     Drain Location: RUQ Size: Fr size: 10 Fr Date of placement: 06/09/24  Currently to: Drain collection device: gravity 24 hour output:  Output by Drain (mL) 06/08/24 0701 - 06/08/24 1900 06/08/24 1901 - 06/09/24 0700 06/09/24 0701 - 06/09/24 1900 06/09/24 1901 - 06/10/24 0700 06/10/24 0701 - 06/10/24 1606  Biliary Tube VTCB biliary 10 Fr. RUQ    325     Interval imaging/drain manipulation:  None  Current examination: Flushes easily.  Insertion site unremarkable. Suture and stat lock in place. Dressed appropriately.    Labs:  CBC: Recent Labs    03/30/24 0411 06/08/24 1628 06/09/24 0303 06/10/24 0602 06/10/24 1227  WBC 5.4 6.6 6.5 6.8  --   HGB 10.5* 12.4 12.1 9.9* 10.3*  HCT 31.5* 38.0 36.5 30.0*  --   PLT 175 209 179 141*  --     COAGS: Recent Labs    03/27/24 2057 03/27/24 2104 06/09/24 1256  INR 1.2  --  1.1  APTT  --  33  --     BMP: Recent Labs    03/31/24 0446 06/08/24 1628 06/09/24 0303 06/10/24 0602  NA 140 141 142 143  K 3.6 3.0* 3.1* 3.7  CL 111 104 106 108  CO2 20*  24 25 24   GLUCOSE 100* 123* 116* 95  BUN 23 12 12 15   CALCIUM  9.1 9.3 9.1 8.7*  CREATININE 0.96 0.82 0.60 0.70  GFRNONAA >60 >60 >60 >60    LIVER FUNCTION TESTS: Recent Labs    03/27/24 2104 06/08/24 1628 06/09/24 0303 06/10/24 0602  BILITOT 0.7 9.8* 10.3* 6.7*  AST 19 140* 130* 78*  ALT 11 112* 106* 77*  ALKPHOS 67 450* 446* 349*  PROT 7.2 6.3* 6.4* 5.2*  ALBUMIN 3.4* 2.8* 2.8* 2.4*    Assessment and Plan:  Obstructive Jaundice secondary to pancreatic head mass: HADIYAH MARICLE is a 68 y.o. female with a history of metastatic RCC and roux-en-Y who presented to the ED with abdominal pain and  was admitted for obstructive jaundice secondary to pancreatic head mass. Patient underwent percutaneous cholangiogram with biliary drain placement on 8/13 with Dr. VEAR Lent.   -Patient reports no pain at the drain site, but admits to mild pain when flushing the drain  -T bili 6.7 > 10.3 yesterday -Likely plan for discharge tomorrow if T bili continues to downtrend -Discussed her urine should likely lighten in color now that we've placed drain -Patient is aware of follow up in ~6wks at which time we will assess for possible stent placement  While Inpatient: Continue TID flushes with 5 cc NS. Record output Q shift. Dressing changes QD or PRN if soiled.  Call IR APP or on call IR MD if difficulty flushing or sudden change in drain output.   Discharge planning: -IR follow up order placed for 6wks from discharge -Discussed 6wk follow up with patient at which time we will assess for possible stent placement; patient verbalized understanding -Drain is to be capped prior to discharge. IR APP will likely need to cap drain at the bedside so that patient knows how to cap/uncap at home -Patient should continue to flush catheter daily to prevent clogging -Discussed with patient should she develop fever/chills, nausea/vomiting, or worsening abdominal pain, she will need to uncap the drain and  reattach the drainage bag. She should also call the IR office if this occurs. Patient verbalized understanding and discharge instructions have been provided for AVS -Saline flushes have been ordered to [facility]  -Keep the insertion site covered with gauze or saran wrap while showering. Replace with a clean/dry bandage after showering   IR will see patient tomorrow to cap drain prior to discharge.   Thank you for allowing our service to participate in MAALLE STARRETT 's care.   Electronically Signed: Ajiah Mcglinn M Alohilani Levenhagen, PA-C 06/10/2024, 3:48 PM    I spent a total of 25 Minutes at the the patient's bedside AND on the patient's hospital floor or unit, greater than 50% of which was counseling/coordinating care for drain care follow up.

## 2024-06-10 NOTE — Progress Notes (Addendum)
 PROGRESS NOTE    Alyssa Leonard  FMW:[id number] DOB: 12-20-1955 DOA: 06/08/2024 PCP: [name] LABOR, MD  Outpatient Specialists: [facility]    Brief Narrative:   From admission h and p  Alyssa Leonard is a 68 y.o. female with medical history significant for renal cell carcinoma on Ipilimumab (IPI) and Nivolumab (NIVO) immunotherapy in [facility], hypertension, diet-controlled diabetes, morbid obesity, OSA on CPAP, recently diagnosed a flutter on Xarelto  and amiodarone  being admitted with obstructive jaundice secondary to intra-abdominal metastasis.she presented with 2 weeks of mid and upper abdominal pain and worsening constipation.  She endorses nausea but denies vomiting, fever or chills or dysuria. In the ED, vitals within normal limits  Assessment & Plan:   Principal Problem:   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 [facility]) Active Problems:   Mass of head of pancreas   Renal cell carcinoma (HCC)   Paroxysmal atrial flutter (HCC)   Hypertension   COPD (chronic obstructive pulmonary disease) (HCC)   Diabetes mellitus without complication (HCC)   OSA (obstructive sleep apnea)   Morbid obesity with BMI of 50.0-59.9, adult (HCC)  # Obstructive jaundice 2/2 growing pancreatic mass. GI can't perform EGD for stent (given hx roux-en-y). IR consulted and placed transhepatic biliary drain on 8/13. LFTs improving today, they advise monitor one more day to ensure continue to improve and no sig electrolyte derangements. - outpt [facility] f/u  # Pancreatic mass # RCC Primary neoplasm vs met from known rcc. Drain as above. On monthly immunotherapy infusions w/ [facility] - outpt f/u as above  # Anemia Recent hgb 10s-11s, was 12s on arrival here, dropped to 9.9 today and 10.3 on repeat - trend - repeat CT scan if further drop tomorrow, to eval for bleeding complication of yesterday's surgery  # Paroxysmal a-flutter Never started xarelto  - cont amio, metop  #  HTN Controlled - home metop, losart  # COPD Quiescent  # OSA - cpap at bedtime  # Obesity noted   DVT prophylaxis: SCDs Code Status: full Family Communication: none at bedside  Level of care: Med-Surg Status is: Inpatient Remains inpatient appropriate because: need for inpatient monitorin    Consultants:  IR, GI  Procedures: Transhepatic biliary drain placement  Antimicrobials:  Ceftriaxone /flagyl  ppx    Subjective: Reports ongoing stable epigastric pain. Tolerating some diet, nausea this morning  Objective: Vitals:   06/10/24 0017 06/10/24 0414 06/10/24 0736 06/10/24 1554  BP: 105/67 119/75 134/72 (!) 142/66  Pulse: 61 (!) 58 64 73  Resp: 14 16 17 16   Temp: 97.8 F (36.6 C) 98.1 F (36.7 C) 97.8 F (36.6 C) 98.1 F (36.7 C)  TempSrc:   Oral Oral  SpO2: 99% 100% 100% 99%  Weight:      Height:        Intake/Output Summary (Last 24 hours) at 06/10/2024 1605 Last data filed at 06/10/2024 1430 Gross per 24 hour  Intake 340 ml  Output 325 ml  Net 15 ml   Filed Weights   06/09/24 0300 06/09/24 1354  Weight: 116.7 kg 116.7 kg    Examination:  General exam: Appears calm and comfortable  Respiratory system: Clear to auscultation. Respiratory effort normal. Cardiovascular system: S1 & S2 heard, RRR. No JVD, murmurs, rubs, gallops or clicks. No pedal edema. Gastrointestinal system: Abdomen is obese, mild tenderness upper. RUQ drain. Central nervous system: Alert and oriented. No focal neurological deficits. Extremities: Symmetric 5 x 5 power. Skin: No rashes, lesions or ulcers Psychiatry: Judgement and insight  appear normal. Mood & affect appropriate.     Data Reviewed: I have personally reviewed following labs and imaging studies  CBC: Recent Labs  Lab 06/08/24 1628 06/09/24 0303 06/10/24 0602 06/10/24 1227  WBC 6.6 6.5 6.8  --   NEUTROABS 4.2  --   --   --   HGB 12.4 12.1 9.9* 10.3*  HCT 38.0 36.5 30.0*  --   MCV 86.0 86.1 85.7  --    PLT 209 179 141*  --    Basic Metabolic Panel: Recent Labs  Lab 06/08/24 1628 06/09/24 0303 06/10/24 0602  NA 141 142 143  K 3.0* 3.1* 3.7  CL 104 106 108  CO2 24 25 24   GLUCOSE 123* 116* 95  BUN 12 12 15   CREATININE 0.82 0.60 0.70  CALCIUM  9.3 9.1 8.7*  MG  --  2.0  --    GFR: Estimated Creatinine Clearance: 78.6 mL/min (by C-G formula based on SCr of 0.7 mg/dL). Liver Function Tests: Recent Labs  Lab 06/08/24 1628 06/09/24 0303 06/10/24 0602  AST 140* 130* 78*  ALT 112* 106* 77*  ALKPHOS 450* 446* 349*  BILITOT 9.8* 10.3* 6.7*  PROT 6.3* 6.4* 5.2*  ALBUMIN 2.8* 2.8* 2.4*   Recent Labs  Lab 06/08/24 1628 06/10/24 0602  LIPASE 27 23   No results for input(s): AMMONIA in the last 168 hours. Coagulation Profile: Recent Labs  Lab 06/09/24 1256  INR 1.1   Cardiac Enzymes: No results for input(s): CKTOTAL, CKMB, CKMBINDEX, TROPONINI in the last 168 hours. BNP (last 3 results) No results for input(s): PROBNP in the last 8760 hours. HbA1C: Recent Labs    06/09/24 0303  HGBA1C 5.5   CBG: Recent Labs  Lab 06/09/24 2206 06/10/24 0018 06/10/24 0415 06/10/24 0734 06/10/24 1140  GLUCAP 123* 124* 87 86 109*   Lipid Profile: No results for input(s): CHOL, HDL, LDLCALC, TRIG, CHOLHDL, LDLDIRECT in the last 72 hours. Thyroid  Function Tests: No results for input(s): TSH, T4TOTAL, FREET4, T3FREE, THYROIDAB in the last 72 hours. Anemia Panel: No results for input(s): VITAMINB12, FOLATE, FERRITIN, TIBC, IRON, RETICCTPCT in the last 72 hours. Urine analysis:    Component Value Date/Time   COLORURINE AMBER (A) 06/08/2024 2040   APPEARANCEUR HAZY (A) 06/08/2024 2040   LABSPEC 1.015 06/08/2024 2040   PHURINE 5.0 06/08/2024 2040   GLUCOSEU NEGATIVE 06/08/2024 2040   HGBUR NEGATIVE 06/08/2024 2040   BILIRUBINUR SMALL (A) 06/08/2024 2040   KETONESUR 5 (A) 06/08/2024 2040   PROTEINUR 30 (A) 06/08/2024 2040    NITRITE NEGATIVE 06/08/2024 2040   LEUKOCYTESUR NEGATIVE 06/08/2024 2040   Sepsis Labs: @LABRCNTIP (procalcitonin:4,lacticidven:4)  )No results found for this or any previous visit (from the past 240 hours).       Radiology Studies: IR INT EXT BILIARY DRAIN WITH CHOLANGIOGRAM Result Date: 06/09/2024 INDICATION: 68 year old female with obstructed jaundice. CT imaging demonstrates a large right renal mass with possible direct invasion of the region of the distal common bile duct. History of prior gastric bypass with Roux-en-Y anatomy. Patient is therefore not a candidate for ERCP. EXAM: Percutaneous transhepatic cholangiogram with percutaneous biliary drain placement MEDICATIONS: In patient who has received Rocephin  and Flagyl  today. No additional antibiotic prophylaxis was administered. ANESTHESIA/SEDATION: Moderate (conscious) sedation was employed during this procedure. A total of Versed  2.5 mg and Fentanyl  100 mcg was administered intravenously by the radiology nurse. Total intra-service moderate Sedation Time: 56 minutes. The patient's level of consciousness and vital signs were monitored continuously by radiology nursing throughout  the procedure under my direct supervision. FLUOROSCOPY: Radiation Exposure Index (as provided by the fluoroscopic device): 774 mGy Kerma COMPLICATIONS: None immediate. PROCEDURE: Informed written consent was obtained from the patient after a thorough discussion of the procedural risks, benefits and alternatives. All questions were addressed. Maximal Sterile Barrier Technique was utilized including caps, mask, sterile gowns, sterile gloves, sterile drape, hand hygiene and skin antiseptic. A timeout was performed prior to the initiation of the procedure. The liver was interrogated with ultrasound. Biliary ductal dilatation is present. A suitable skin entry site was selected and marked. A small dermatotomy was made. Under real-time ultrasound guidance, a 22 gauge Chiba  needle was advanced into a peripheral duct in hepatic segment 3. Transhepatic cholangiography was performed. Diffuse biliary ductal dilatation. Unfortunately, a wire could not be successfully passed into the biliary tree from this approach despite multiple attempts. Therefore, attention was turned to a right intracostal approach. Using standard technique, a 22 gauge 15 cm Chiba needle was advanced over a rib, through the periphery of the liver and used to puncture a tertiary radical within the right ductal system. Contrast injection confirms the needle is within the biliary tree. A wire was passed into the central common bile duct. The needle was removed. The Accustick transitional sheath was advanced over the wire and into the common bile duct. A hydrophilic road runner wire was then successfully navigated through the malignant obstruction and into the duodenum. A 5 French catheter was advanced over the wire and into the duodenum. The hydrophilic wire was exchanged for an Amplatz wire. A Cook 10 French internal/external then advanced over the wire and formed. The locking loop is within the duodenum. The proximal side holes are within the obstructed intrahepatic bile ducts. Images were obtained and stored for the medical record. The tube was connected to bag drainage and secured to the skin with 0 Prolene suture. IMPRESSION: 1. 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 2. Successful placement of a 10 French internal/external biliary drainage catheter. PLAN: Maintain drain to bag drainage in till bilirubin has begun trending down. Bag should then be capped to limit fluid and electrolyte losses. Return to interventional Radiology in 6 weeks for tube check and change. Covered self expanding biliary stent placement could be considered in the future.  Electronically Signed   By: Wilkie Lent M.D.   On: 06/09/2024 16:28   CT ABDOMEN PELVIS W CONTRAST Result Date: 06/08/2024 CLINICAL DATA:  Acute abdominal pain EXAM: CT ABDOMEN AND PELVIS WITH CONTRAST TECHNIQUE: Multidetector CT imaging of the abdomen and pelvis was performed using the standard protocol following bolus administration of intravenous contrast. RADIATION DOSE REDUCTION: This exam was performed according to the departmental dose-optimization program which includes automated exposure control, adjustment of the mA and/or kV according to patient size and/or use of iterative reconstruction technique. CONTRAST:  OMNIPAQUE  IOHEXOL  350 MG/ML SOLN COMPARISON:  CT abdomen and pelvis 12/25/2023 FINDINGS: Lower chest: No acute abnormality. Hepatobiliary: Gallbladder surgically absent. There is moderate intrahepatic biliary ductal dilatation which has increased from prior. Common bile duct is also moderately dilated, increased from prior. There are scattered rounded hypodensities in the liver which are too small to characterize and unchanged. No new focal liver lesions are identified. Pancreas: Soft tissue mass in the region of the pancreatic head and uncinate process is increased in size now measuring 3.6 x 5.8 cm. Pancreatic body  and tail are within normal limits. There is mild inflammation surrounding the pancreatic head. Spleen: Normal in size without focal abnormality. Adrenals/Urinary Tract: Right renal mass measures 11.2 by 9.2 by 9.7 cm and has increased in size. This abuts the adjacent duodenum with loss of fat plane, progressed from prior. There is dilatation of the right superior pole calyx likely secondary to compression from renal mass. There is no left-sided hydronephrosis. Bilateral renal cysts are stable. There is no perinephric fat stranding. The adrenal glands and bladder are within normal limits. Stomach/Bowel: Stomach is within normal limits. Appendix appears normal. No evidence  of bowel wall thickening, distention, or inflammatory changes. There is sigmoid colon diverticulosis. There are postsurgical changes in the stomach. There is loss of fat plane between the pancreatic mass in the gastric antrum and duodenum. Vascular/Lymphatic: Aorta and IVC are normal in size. No enlarged lymph nodes are identified. Reproductive: Status post hysterectomy. No adnexal masses. Other: No abdominal wall hernia or abnormality. No abdominopelvic ascites. Musculoskeletal: No acute or significant osseous findings. IMPRESSION: 1. Interval increase in size of right renal mass worrisome for renal cell carcinoma. 2. Interval increase in size of pancreatic head mass worrisome for metastatic disease or primary pancreatic neoplasm. 3. Interval increase in intra and extrahepatic biliary ductal dilatation likely secondary to obstruction at the level of the pancreatic head mass. 4. Loss of fat plane between the pancreatic mass and gastric antrum and duodenum worrisome for invasion. 5. Mild inflammation surrounding the pancreatic head worrisome for pancreatitis. 6. Diverticulosis. Electronically Signed   By: Greig Pique M.D.   On: 06/08/2024 22:46        Scheduled Meds:  amiodarone   200 mg Oral Daily   calcium  carbonate  1 tablet Oral TID WC   famotidine   40 mg Oral QHS   feeding supplement  237 mL Oral BID BM   insulin  aspart  0-15 Units Subcutaneous Q4H   latanoprost   1 drop Both Eyes QHS   losartan   25 mg Oral Daily   metoprolol  tartrate  25 mg Oral BID   multivitamin with minerals  1 tablet Oral BID   sodium chloride  flush  5 mL Intracatheter Q8H   Continuous Infusions:  cefTRIAXone  (ROCEPHIN )  IV 2 g (06/09/24 2310)   metronidazole  500 mg (06/10/24 1516)     LOS: 1 day     Devaughn KATHEE Ban, MD [facility]   If 7PM-7AM, please contact night-coverage [email or web address] Password [facility] 06/10/2024, 4:05 PM

## 2024-06-11 ENCOUNTER — Other Ambulatory Visit: Payer: Self-pay | Admitting: Radiology

## 2024-06-11 ENCOUNTER — Other Ambulatory Visit: Payer: Self-pay

## 2024-06-11 DIAGNOSIS — C801 Malignant (primary) neoplasm, unspecified: Secondary | ICD-10-CM | POA: Diagnosis not present

## 2024-06-11 DIAGNOSIS — K831 Obstruction of bile duct: Secondary | ICD-10-CM | POA: Diagnosis not present

## 2024-06-11 LAB — COMPREHENSIVE METABOLIC PANEL WITH GFR
ALT: 56 U/L — ABNORMAL HIGH (ref 0–44)
AST: 49 U/L — ABNORMAL HIGH (ref 15–41)
Albumin: 2.4 g/dL — ABNORMAL LOW (ref 3.5–5.0)
Alkaline Phosphatase: 347 U/L — ABNORMAL HIGH (ref 38–126)
Anion gap: 6 (ref 5–15)
BUN: 15 mg/dL (ref 8–23)
CO2: 26 mmol/L (ref 22–32)
Calcium: 8.9 mg/dL (ref 8.9–10.3)
Chloride: 110 mmol/L (ref 98–111)
Creatinine, Ser: 0.76 mg/dL (ref 0.44–1.00)
GFR, Estimated: >60 mL/min (ref >60)
Glucose, Bld: 112 mg/dL — ABNORMAL HIGH (ref 70–99)
Potassium: 3.1 mmol/L — ABNORMAL LOW (ref 3.5–5.1)
Sodium: 142 mmol/L (ref 135–145)
Total Bilirubin: 4 mg/dL — ABNORMAL HIGH (ref 0.0–1.2)
Total Protein: 5.5 g/dL — ABNORMAL LOW (ref 6.5–8.1)

## 2024-06-11 LAB — CBC
HCT: 28.7 % — ABNORMAL LOW (ref 36.0–46.0)
Hemoglobin: 9.6 g/dL — ABNORMAL LOW (ref 12.0–15.0)
MCH: 28.5 pg (ref 26.0–34.0)
MCHC: 33.4 g/dL (ref 30.0–36.0)
MCV: 85.2 fL (ref 80.0–100.0)
Platelets: 135 K/uL — ABNORMAL LOW (ref 150–400)
RBC: 3.37 MIL/uL — ABNORMAL LOW (ref 3.87–5.11)
RDW: 17.2 % — ABNORMAL HIGH (ref 11.5–15.5)
WBC: 7 K/uL (ref 4.0–10.5)
nRBC: 0 % (ref 0.0–0.2)

## 2024-06-11 LAB — MAGNESIUM: Magnesium: 1.9 mg/dL (ref 1.7–2.4)

## 2024-06-11 LAB — LIPASE, BLOOD: Lipase: 32 U/L (ref 11–51)

## 2024-06-11 MED ORDER — SODIUM CHLORIDE FLUSH 0.9 % IV SOLN
INTRAVENOUS | 1 refills | Status: AC
  Filled 2024-06-11: qty 300, 30d supply, fill #0

## 2024-06-11 NOTE — Plan of Care (Signed)

## 2024-06-11 NOTE — Discharge Summary (Signed)
 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 female with medical history significant for renal cell carcinoma on Ipilimumab (IPI) and Nivolumab (NIVO) immunotherapy in [facility], hypertension, diet-controlled diabetes, morbid obesity, OSA on CPAP, recently diagnosed a flutter on Xarelto  and amiodarone  being admitted with obstructive jaundice secondary to intra-abdominal metastasis.she presented with 2 weeks of mid and upper abdominal pain and worsening constipation.  She endorses nausea but denies vomiting, fever or chills or dysuria.   Hospital Course:   # Obstructive jaundice 2/2 growing pancreatic mass. GI can't perform EGD to place stent (given hx roux-en-y). IR consulted and placed transhepatic biliary drain on 8/13. LFTs improving the next two days. OK to now cap the external drain, flush daily (patient was taught to do this and supplies provided). Epigastric abdominal pain is improving since drain placement. - F/u IR 6 weeks - close outpt [facility] f/u - has biopsy of that mass scheduled, advised notify her surg onc provider of this new development   # Pancreatic mass #  RCC Primary neoplasm vs met from known rcc. Drain as above. On monthly immunotherapy infusions w/ [facility] - outpt f/u as above   # Paroxysmal a-flutter - cont amio, metop - can resume home xarelto  (confirmed this w/ IR)   # HTN Controlled - home metop, losart   # COPD Quiescent   # OSA - cpap at bedtime   # Obesity noted  Procedures: Percutaneous biliary drain placement   Consultations: IR  Discharge Exam: Vitals:   06/11/24 0420 06/11/24 0729  BP: (!) 139/57 (!) 143/67  Pulse: (!) 56 65  Resp: 18 16  Temp: 98.2 F (36.8 C) 98.2 F (36.8 C)  SpO2: 100% 99%    General: NAD Cardiovascular: RRR Respiratory: CTAB Abdomen: obese, mild tenderness, biliary drain in place with c/d/I bandage  Discharge Instructions   Discharge Instructions     Diet - low sodium heart healthy   Complete by: As directed    Discharge wound care:   Complete by: As directed    Keep the insertion site covered with gauze or saran wrap while showering. Replace with a clean/dry bandage after showering   Increase activity slowly   Complete by: As directed       Allergies as of 06/11/2024       Reactions   Latex Other (See Comments), Dermatitis, Hives, Swelling   unknown Other reaction(s): Other (See Comments)  unknown Other reaction(s): Other (See Comments) unknown    unknown   Aspirin Other (See Comments), Nausea And Vomiting   Makes stomach burn Other reaction(s): Other (See Comments)  Makes stomach burn Makes stomach burn  Other reaction(s): Other (See Comments) Makes stomach burn   Plasticized Base [plastibase]         Medication List     STOP taking these medications    Brinzolamide -Brimonidine  1-0.2 % Susp   colestipol  1 g tablet Commonly known as: COLESTID        TAKE these medications    acetaminophen  500 MG tablet Commonly known as: TYLENOL  Take 1,000 mg by mouth every 6 (six) hours as needed for mild pain (pain score 1-3).   albuterol  108  (90 Base) MCG/ACT inhaler Commonly known as: VENTOLIN  HFA Inhale 2 puffs into the lungs every 6 (six) hours as needed for wheezing or shortness of breath.   amiodarone  200 MG tablet Commonly known as: PACERONE  Take 1 tablet (200 mg total) by mouth daily.   diazepam  2 MG tablet Commonly known as: VALIUM  Take 1 tablet (2 mg total) by mouth 2 (two) times daily as needed for anxiety. Home med.   dibucaine 1 % Oint Commonly known as: NUPERCAINAL Place 1 Application rectally as needed for hemorrhoids.   dorzolamide  2 % ophthalmic solution Commonly known as: TRUSOPT  Place 1 drop into both eyes 2 (two) times daily.   famotidine  40 MG tablet Commonly known as: PEPCID  Take 40 mg by mouth at bedtime.   latanoprost  0.005 % ophthalmic solution Commonly known as: XALATAN  1 drop at bedtime.   loperamide  1 MG/5ML solution Commonly known as: IMODIUM  Take 1 mg by mouth daily as needed for diarrhea or loose stools.   losartan  25 MG tablet Commonly known as: COZAAR  Take 1 tablet (25 mg total) by mouth daily. What changed: when to take this   meclizine  25 MG tablet Commonly known as: ANTIVERT  Take 25 mg by mouth 3 (three) times daily as needed for dizziness.   metoprolol  tartrate 25 MG tablet Commonly known as: LOPRESSOR  Take 0.5 tablets (12.5 mg total) by mouth 2 (two) times daily. What changed: how much to take   multivitamin with minerals Tabs tablet Take 1 tablet by mouth daily.   ondansetron  4 MG tablet Commonly known as: ZOFRAN  Take 4 mg by mouth every 8 (eight) hours as needed for nausea or vomiting.   rivaroxaban  20 MG Tabs tablet Commonly known as: XARELTO  Take 1 tablet (20 mg total) by mouth daily.   sodium chloride  flush 0.9 % Soln injection Flush biliary cathter with 5-10mL saline daily to prevent clogging   Testosterone 12.5 MG/ACT (1%) Gel Apply 12.5 mg topically daily.               Discharge Care Instructions  (From admission, onward)            Start     Ordered   06/11/24 0000  Discharge wound care:       Comments: Keep the insertion site covered with gauze or saran wrap while showering. Replace with a clean/dry bandage after showering   06/11/24 1114           Allergies  Allergen Reactions   Latex Other (See Comments), Dermatitis, Hives and Swelling    unknown  Other reaction(s): Other (See Comments)  unknown  Other reaction(s): Other (See Comments) unknown    unknown   Aspirin Other (See Comments) and Nausea And Vomiting    Makes stomach burn  Other reaction(s): Other (See Comments)  Makes stomach burn  Makes stomach burn    Other reaction(s): Other (See Comments) Makes stomach burn   Plasticized Base [Plastibase]     Follow-up Information  Entzminger, Ethridge LABOR, MD Follow up.   Specialty: Internal Medicine Contact information: [address] [place] [address] [phone]         Your [facility] oncologist Follow up.   Why: schedule an appointment soon for follow-up                 The results of significant diagnostics from this hospitalization (including imaging, microbiology, ancillary and laboratory) are listed below for reference.    Significant Diagnostic Studies: IR INT EXT BILIARY DRAIN WITH CHOLANGIOGRAM Result Date: 06/09/2024 INDICATION: 68 year old female with obstructed jaundice. CT imaging demonstrates a large right renal mass with possible direct invasion of the region of the distal common bile duct. History of prior gastric bypass with Roux-en-Y anatomy. Patient is therefore not a candidate for ERCP. EXAM: Percutaneous transhepatic cholangiogram with percutaneous biliary drain placement MEDICATIONS: In patient who has received Rocephin  and Flagyl  today. No additional antibiotic prophylaxis was administered. ANESTHESIA/SEDATION: Moderate (conscious) sedation was employed during this procedure. A total of Versed  2.5 mg and Fentanyl  100 mcg was administered intravenously by the  radiology nurse. Total intra-service moderate Sedation Time: 56 minutes. The patient's level of consciousness and vital signs were monitored continuously by radiology nursing throughout the procedure under my direct supervision. FLUOROSCOPY: Radiation Exposure Index (as provided by the fluoroscopic device): 774 mGy Kerma COMPLICATIONS: None immediate. PROCEDURE: Informed written consent was obtained from the patient after a thorough discussion of the procedural risks, benefits and alternatives. All questions were addressed. Maximal Sterile Barrier Technique was utilized including caps, mask, sterile gowns, sterile gloves, sterile drape, hand hygiene and skin antiseptic. A timeout was performed prior to the initiation of the procedure. The liver was interrogated with ultrasound. Biliary ductal dilatation is present. A suitable skin entry site was selected and marked. A small dermatotomy was made. Under real-time ultrasound guidance, a 22 gauge Chiba needle was advanced into a peripheral duct in hepatic segment 3. Transhepatic cholangiography was performed. Diffuse biliary ductal dilatation. Unfortunately, a wire could not be successfully passed into the biliary tree from this approach despite multiple attempts. Therefore, attention was turned to a right intracostal approach. Using standard technique, a 22 gauge 15 cm Chiba needle was advanced over a rib, through the periphery of the liver and used to puncture a tertiary radical within the right ductal system. Contrast injection confirms the needle is within the biliary tree. A wire was passed into the central common bile duct. The needle was removed. The Accustick transitional sheath was advanced over the wire and into the common bile duct. A hydrophilic road runner wire was then successfully navigated through the malignant obstruction and into the duodenum. A 5 French catheter was advanced over the wire and into the duodenum. The hydrophilic wire was exchanged for  an Amplatz wire. A Cook 10 French internal/external then advanced over the wire and formed. The locking loop is within the duodenum. The proximal side holes are within the obstructed intrahepatic bile ducts. Images were obtained and stored for the medical record. The tube was connected to bag drainage and secured to the skin with 0 Prolene suture. IMPRESSION: 1. 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 2. Successful placement of a 10 French internal/external biliary drainage catheter. PLAN: Maintain drain to bag drainage in till bilirubin has begun trending down. Bag should then be capped to limit fluid and electrolyte losses. Return to interventional  Radiology in 6 weeks for tube check and change. Covered self expanding biliary stent placement could be considered in the future. Electronically Signed   By: Wilkie Lent M.D.   On: 06/09/2024 16:28   CT ABDOMEN PELVIS W CONTRAST Result Date: 06/08/2024 CLINICAL DATA:  Acute abdominal pain EXAM: CT ABDOMEN AND PELVIS WITH CONTRAST TECHNIQUE: Multidetector CT imaging of the abdomen and pelvis was performed using the standard protocol following bolus administration of intravenous contrast. RADIATION DOSE REDUCTION: This exam was performed according to the departmental dose-optimization program which includes automated exposure control, adjustment of the mA and/or kV according to patient size and/or use of iterative reconstruction technique. CONTRAST:  OMNIPAQUE  IOHEXOL  350 MG/ML SOLN COMPARISON:  CT abdomen and pelvis 12/25/2023 FINDINGS: Lower chest: No acute abnormality. Hepatobiliary: Gallbladder surgically absent. There is moderate intrahepatic biliary ductal dilatation which has increased from prior. Common bile duct is also moderately dilated, increased from prior. There are  scattered rounded hypodensities in the liver which are too small to characterize and unchanged. No new focal liver lesions are identified. Pancreas: Soft tissue mass in the region of the pancreatic head and uncinate process is increased in size now measuring 3.6 x 5.8 cm. Pancreatic body and tail are within normal limits. There is mild inflammation surrounding the pancreatic head. Spleen: Normal in size without focal abnormality. Adrenals/Urinary Tract: Right renal mass measures 11.2 by 9.2 by 9.7 cm and has increased in size. This abuts the adjacent duodenum with loss of fat plane, progressed from prior. There is dilatation of the right superior pole calyx likely secondary to compression from renal mass. There is no left-sided hydronephrosis. Bilateral renal cysts are stable. There is no perinephric fat stranding. The adrenal glands and bladder are within normal limits. Stomach/Bowel: Stomach is within normal limits. Appendix appears normal. No evidence of bowel wall thickening, distention, or inflammatory changes. There is sigmoid colon diverticulosis. There are postsurgical changes in the stomach. There is loss of fat plane between the pancreatic mass in the gastric antrum and duodenum. Vascular/Lymphatic: Aorta and IVC are normal in size. No enlarged lymph nodes are identified. Reproductive: Status post hysterectomy. No adnexal masses. Other: No abdominal wall hernia or abnormality. No abdominopelvic ascites. Musculoskeletal: No acute or significant osseous findings. IMPRESSION: 1. Interval increase in size of right renal mass worrisome for renal cell carcinoma. 2. Interval increase in size of pancreatic head mass worrisome for metastatic disease or primary pancreatic neoplasm. 3. Interval increase in intra and extrahepatic biliary ductal dilatation likely secondary to obstruction at the level of the pancreatic head mass. 4. Loss of fat plane between the pancreatic mass and gastric antrum and duodenum worrisome  for invasion. 5. Mild inflammation surrounding the pancreatic head worrisome for pancreatitis. 6. Diverticulosis. Electronically Signed   By: Greig Pique M.D.   On: 06/08/2024 22:46    Microbiology: No results found for this or any previous visit (from the past 240 hours).   Labs: Basic Metabolic Panel: Recent Labs  Lab 06/08/24 1628 06/09/24 0303 06/10/24 0602 06/11/24 0531  NA 141 142 143 142  K 3.0* 3.1* 3.7 3.1*  CL 104 106 108 110  CO2 24 25 24 26   GLUCOSE 123* 116* 95 112*  BUN 12 12 15 15   CREATININE 0.82 0.60 0.70 0.76  CALCIUM  9.3 9.1 8.7* 8.9  MG  --  2.0  --  1.9   Liver Function Tests: Recent Labs  Lab 06/08/24 1628 06/09/24 0303 06/10/24 0602 06/11/24 0531  AST 140* 130*  78* 49*  ALT 112* 106* 77* 56*  ALKPHOS 450* 446* 349* 347*  BILITOT 9.8* 10.3* 6.7* 4.0*  PROT 6.3* 6.4* 5.2* 5.5*  ALBUMIN 2.8* 2.8* 2.4* 2.4*   Recent Labs  Lab 06/08/24 1628 06/10/24 0602 06/11/24 0531  LIPASE 27 23 32   No results for input(s): AMMONIA in the last 168 hours. CBC: Recent Labs  Lab 06/08/24 1628 06/09/24 0303 06/10/24 0602 06/10/24 1227 06/11/24 0531  WBC 6.6 6.5 6.8  --  7.0  NEUTROABS 4.2  --   --   --   --   HGB 12.4 12.1 9.9* 10.3* 9.6*  HCT 38.0 36.5 30.0*  --  28.7*  MCV 86.0 86.1 85.7  --  85.2  PLT 209 179 141*  --  135*   Cardiac Enzymes: No results for input(s): CKTOTAL, CKMB, CKMBINDEX, TROPONINI in the last 168 hours. BNP: BNP (last 3 results) No results for input(s): BNP in the last 8760 hours.  ProBNP (last 3 results) No results for input(s): PROBNP in the last 8760 hours.  CBG: Recent Labs  Lab 06/10/24 0018 06/10/24 0415 06/10/24 0734 06/10/24 1140 06/10/24 1641  GLUCAP 124* 87 86 109* 164*       Signed:  Devaughn KATHEE Ban MD.  [facility] 06/11/2024, 11:15 AM

## 2024-06-15 ENCOUNTER — Ambulatory Visit
Admission: RE | Admit: 2024-06-15 | Discharge: 2024-06-15 | Disposition: A | Discharge status: Home / Self Care | Source: Ambulatory Visit

## 2024-06-15 ENCOUNTER — Other Ambulatory Visit: Payer: Self-pay

## 2024-06-15 DIAGNOSIS — T85520A Displacement of bile duct prosthesis, initial encounter: Secondary | ICD-10-CM

## 2024-06-15 DIAGNOSIS — K8689 Other specified diseases of pancreas: Secondary | ICD-10-CM

## 2024-06-15 HISTORY — PX: IR RADIOLOGIST EVAL & MGMT: IMG5224

## 2024-06-15 NOTE — Progress Notes (Addendum)
 Patient with obstructive jaundice s/p internal/external biliary drain placement 06/09/24. The drain was capped prior to hospital discharge on 06/11/24. The patient was instructed to place the drain to a gravity bag if she were to develop abdominal pain.   The patient called IR stating she had some moderate RUQ/Epigastric pain. She said she did not have a bag. She came to IR today for a drain evaluation and the patient and her husband were unclear of the drain instructions they received at hospital discharge. They did have a bag and they were intermittently placing the drain to the gravity bag.   The drain was easily flushed with 10 ml NS with green bile easily aspirated. The drain was connected to a gravity bag with 200 ml of bile draining into the bag over the course of a few minutes. The skin insertion site was clean/dry with suture and stat-lock in place.   Drain care teaching was performed with the patient and her husband. They will return to IR in approximately one week for a drain evaluation. They were instructed to call our team if they have to empty the entire bag more than twice daily.   They verbalized understanding and were encouraged to call IR with any questions/concerns.   Emilly Lavey, AGACNP-BC 06/15/2024, 4:19 PM

## 2024-06-15 NOTE — Telephone Encounter (Signed)
 Multidisciplinary Oncology Program Intake Form  Referral Receive Date:  8/19  Rapid Access Appointment : No -  Patient not in scope for Rapid Access  Reason for Referral:  Patient Diagnosis: panc mass Internal Referral Disease Group: Gastrointestinal  Specialty:  Surgery  Referral Method:  CIT Group  Insurance Primary Insurance: [employer]   Authorization Obtained: NA   Record Collection:   RECORDS/IMAGING/PATH - INTERNAL REFERRAL  Screening Questions:   If <43, is the patient interested in learning about [facility] Fertility Preservation? NA  Close the Loop Communication: Referring Provider Contacted: Yes Patient Contacted: Yes  Welcome Packet  Date Sent: N/A Sent: NNPP

## 2024-06-16 ENCOUNTER — Telehealth: Payer: Self-pay

## 2024-06-16 NOTE — Telephone Encounter (Cosign Needed)
   Patient with obstructive jaundice s/p internal/external biliary drain placement 06/09/24. The drain was capped prior to hospital discharge on 06/11/24. The patient was instructed to place the drain to a gravity bag if she were to develop abdominal pain.    The patient called IR 8/19 stating she had some moderate RUQ/Epigastric pain. She was instructed to present for drain check. At that time, drain flushed easily, and green bile return noted after connection to bag. Drain care teaching was performed with the patient and her husband. Plan was made for patient to return in 1 week (8/26) for drain check.   Today, patient called with concern for drain output color change and questions about how to flush the drain. Described reddish brown output similar to charted output 8/14. Able to flush drain. Husband removed dressing to look at insertion, did not see any leakage around tube or abnormality with ext suture. She states the abd pain felt prior to uncapping yesterday has resolved. Denies fever, chills, SOB, abd pain, n/v, dizziness, pale appearance, increased yellowing of the whites of her eyes.  No indication that patient needs to return sooner than originally planned. Instructed them to call in with any additional concerns/changes as previously discussed. Drain teaching reinforced on call with husband and another family member present.       Electronically Signed: Laymon Coast, NP 06/16/2024, 3:06 PM

## 2024-06-21 NOTE — Progress Notes (Signed)
 Patient for IR Biliary Drain evaluation on Tues 06/22/24, I called and spoke with the patient on the phone and gave pre-procedure instructions. Pt was made aware to be here at 1:30p and check in at the new entrance. Pt stated understanding. Called 06/21/24

## 2024-06-22 ENCOUNTER — Other Ambulatory Visit: Payer: Self-pay | Admitting: Interventional Radiology

## 2024-06-22 ENCOUNTER — Ambulatory Visit
Admission: RE | Admit: 2024-06-22 | Discharge: 2024-06-22 | Disposition: A | Discharge status: Home / Self Care | Source: Ambulatory Visit | Attending: Student | Admitting: Student

## 2024-06-22 ENCOUNTER — Other Ambulatory Visit: Payer: Self-pay | Admitting: Student

## 2024-06-22 DIAGNOSIS — T85520A Displacement of bile duct prosthesis, initial encounter: Secondary | ICD-10-CM | POA: Diagnosis not present

## 2024-06-22 DIAGNOSIS — K831 Obstruction of bile duct: Secondary | ICD-10-CM | POA: Diagnosis not present

## 2024-06-22 DIAGNOSIS — K8689 Other specified diseases of pancreas: Secondary | ICD-10-CM

## 2024-06-22 HISTORY — PX: IR EXCHANGE BILIARY DRAIN: IMG6046

## 2024-06-22 MED ORDER — LIDOCAINE HCL 1 % IJ SOLN
10.0000 mL | Freq: Once | INTRAMUSCULAR | Status: AC
  Administered 2024-06-22: 3 mL via INTRADERMAL

## 2024-06-22 MED ORDER — LIDOCAINE HCL 1 % IJ SOLN
INTRAMUSCULAR | Status: AC
  Filled 2024-06-22: qty 20

## 2024-06-22 MED ORDER — IOHEXOL 300 MG/ML  SOLN
50.0000 mL | Freq: Once | INTRAMUSCULAR | Status: AC | PRN
  Administered 2024-06-22: 25 mL

## 2024-06-22 NOTE — Procedures (Signed)
 Interventional Radiology Procedure Note  Procedure: Biliary drain exchange with cholangiogram  Complications: None  Estimated Blood Loss: < 5 mL  Findings: Previously placed internal/external biliary drain nearly completely retracted out of liver and bile ducts.  Able to catheterize tract back into bile ducts and eventually through CBD and into duodenum with 5 Fr catheter.  Due to extreme buckling of guidewire and catheters outside of liver in soft tissues, unable to track a new internal/external drain all the way into duodenum.   New 10 Fr MPD drain advanced into right intrahepatic ducts and proximal CBD. Attached to gravity bag.   Plan: Bring patient back in few days/next week for attempted advancement of new drain through to duodenum versus new drain via new access of bile ducts under conscious sedation.  Marcey DASEN. [name], M.D Pager:  [phone]

## 2024-06-24 NOTE — Progress Notes (Signed)
 Patient for IR Biliary drain convert to int/ext on Tues 06/29/24, I called and spoke with the patient on the phone and gave pre-procedure instructions. Pt was made aware to be here at 11:30a, NPO after MN prior to procedure as well as driver post procedure/recovery/discharge. Pt stated understanding.  Called 06/24/24

## 2024-06-25 ENCOUNTER — Other Ambulatory Visit (HOSPITAL_COMMUNITY): Payer: Self-pay | Admitting: Student

## 2024-06-25 DIAGNOSIS — K8689 Other specified diseases of pancreas: Secondary | ICD-10-CM

## 2024-06-25 NOTE — Telephone Encounter (Addendum)
  The patient reports they are physically located in [place]  and is currently: at home. I conducted a phone visit.  I spent 15 minutes on the phone call with the patient on the date of service .   Alyssa Leonard is a 68 y.o. 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 Now S/p Pancreatic lesions / IPMN;  Presented OSH on 06/08/2024 for 2 weeks of mid and upper abdominal pain and worsening constipation. CT A/P showed pancreatic lesion with obstructive jaundice due to intra-abdominal metastasis.  Transhepatic biliary drain placed, 8/13 but malfunctioning due to lack of flushing, causing pain. Flushed and 2.3 mL's output at home.  Called in with c/o of ongoing abdominal pain in right upper quadrant at the drain site and to the left of the drain. The pain has been gradually getting worst not relieve by tramadol 50 mg PRN. She reported the typically empty ~400-500 mls daily from the drain and normally flushes it daily. She denies having any fever, or chills today but chills/rigors a few days ago but it stopped. No reports of diarrhea. She reports some back pain as well.  Discuss with Luke Elsie Sames, MD and agreed low suspicion of Cholangitis; can try hydromorphone. 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

## 2024-06-29 ENCOUNTER — Inpatient Hospital Stay: Admission: RE | Admit: 2024-06-29 | Source: Ambulatory Visit | Admitting: Interventional Radiology

## 2024-06-29 ENCOUNTER — Other Ambulatory Visit: Payer: Self-pay

## 2024-06-29 ENCOUNTER — Inpatient Hospital Stay
Admission: AD | Admit: 2024-06-29 | Discharge: 2024-07-01 | DRG: 919 | Disposition: A | Discharge status: Home / Self Care | Source: Ambulatory Visit | Attending: Internal Medicine | Admitting: Internal Medicine

## 2024-06-29 ENCOUNTER — Encounter: Payer: Self-pay | Admitting: Radiology

## 2024-06-29 VITALS — BP 131/80 | HR 82 | Temp 98.0°F | Resp 18 | Ht 60.0 in | Wt 268.0 lb

## 2024-06-29 DIAGNOSIS — G4733 Obstructive sleep apnea (adult) (pediatric): Secondary | ICD-10-CM | POA: Diagnosis present

## 2024-06-29 DIAGNOSIS — T85590A Other mechanical complication of bile duct prosthesis, initial encounter: Principal | ICD-10-CM | POA: Diagnosis present

## 2024-06-29 DIAGNOSIS — Z6841 Body Mass Index (BMI) 40.0 and over, adult: Secondary | ICD-10-CM | POA: Diagnosis not present

## 2024-06-29 DIAGNOSIS — K8689 Other specified diseases of pancreas: Secondary | ICD-10-CM | POA: Diagnosis present

## 2024-06-29 DIAGNOSIS — C641 Malignant neoplasm of right kidney, except renal pelvis: Secondary | ICD-10-CM

## 2024-06-29 DIAGNOSIS — Y831 Surgical operation with implant of artificial internal device as the cause of abnormal reaction of the patient, or of later complication, without mention of misadventure at the time of the procedure: Secondary | ICD-10-CM | POA: Diagnosis present

## 2024-06-29 DIAGNOSIS — I4891 Unspecified atrial fibrillation: Secondary | ICD-10-CM | POA: Diagnosis present

## 2024-06-29 DIAGNOSIS — I10 Essential (primary) hypertension: Secondary | ICD-10-CM | POA: Diagnosis present

## 2024-06-29 DIAGNOSIS — I4892 Unspecified atrial flutter: Secondary | ICD-10-CM | POA: Diagnosis present

## 2024-06-29 DIAGNOSIS — Z91141 Patient's other noncompliance with medication regimen due to financial hardship: Secondary | ICD-10-CM

## 2024-06-29 DIAGNOSIS — Z91148 Patient's other noncompliance with medication regimen for other reason: Secondary | ICD-10-CM

## 2024-06-29 DIAGNOSIS — Z888 Allergy status to other drugs, medicaments and biological substances status: Secondary | ICD-10-CM | POA: Diagnosis not present

## 2024-06-29 DIAGNOSIS — N179 Acute kidney failure, unspecified: Secondary | ICD-10-CM | POA: Diagnosis present

## 2024-06-29 DIAGNOSIS — Z8249 Family history of ischemic heart disease and other diseases of the circulatory system: Secondary | ICD-10-CM | POA: Diagnosis not present

## 2024-06-29 DIAGNOSIS — Z886 Allergy status to analgesic agent status: Secondary | ICD-10-CM | POA: Diagnosis not present

## 2024-06-29 DIAGNOSIS — E119 Type 2 diabetes mellitus without complications: Secondary | ICD-10-CM | POA: Diagnosis present

## 2024-06-29 DIAGNOSIS — C7889 Secondary malignant neoplasm of other digestive organs: Secondary | ICD-10-CM | POA: Diagnosis present

## 2024-06-29 DIAGNOSIS — Z9884 Bariatric surgery status: Secondary | ICD-10-CM

## 2024-06-29 DIAGNOSIS — Z7901 Long term (current) use of anticoagulants: Secondary | ICD-10-CM | POA: Diagnosis not present

## 2024-06-29 DIAGNOSIS — E860 Dehydration: Secondary | ICD-10-CM | POA: Diagnosis present

## 2024-06-29 DIAGNOSIS — Z87891 Personal history of nicotine dependence: Secondary | ICD-10-CM

## 2024-06-29 DIAGNOSIS — K831 Obstruction of bile duct: Secondary | ICD-10-CM | POA: Diagnosis present

## 2024-06-29 DIAGNOSIS — Z9221 Personal history of antineoplastic chemotherapy: Secondary | ICD-10-CM

## 2024-06-29 DIAGNOSIS — A021 Salmonella sepsis: Secondary | ICD-10-CM

## 2024-06-29 DIAGNOSIS — R6520 Severe sepsis without septic shock: Secondary | ICD-10-CM | POA: Diagnosis not present

## 2024-06-29 DIAGNOSIS — C801 Malignant (primary) neoplasm, unspecified: Secondary | ICD-10-CM | POA: Diagnosis not present

## 2024-06-29 DIAGNOSIS — K219 Gastro-esophageal reflux disease without esophagitis: Secondary | ICD-10-CM | POA: Diagnosis present

## 2024-06-29 DIAGNOSIS — Z91040 Latex allergy status: Secondary | ICD-10-CM | POA: Diagnosis not present

## 2024-06-29 DIAGNOSIS — A419 Sepsis, unspecified organism: Secondary | ICD-10-CM | POA: Diagnosis present

## 2024-06-29 DIAGNOSIS — C649 Malignant neoplasm of unspecified kidney, except renal pelvis: Secondary | ICD-10-CM | POA: Diagnosis present

## 2024-06-29 DIAGNOSIS — Z79899 Other long term (current) drug therapy: Secondary | ICD-10-CM

## 2024-06-29 DIAGNOSIS — J4489 Other specified chronic obstructive pulmonary disease: Secondary | ICD-10-CM | POA: Diagnosis present

## 2024-06-29 DIAGNOSIS — Z85528 Personal history of other malignant neoplasm of kidney: Secondary | ICD-10-CM | POA: Diagnosis not present

## 2024-06-29 DIAGNOSIS — Z860100 Personal history of colon polyps, unspecified: Secondary | ICD-10-CM

## 2024-06-29 DIAGNOSIS — Z841 Family history of disorders of kidney and ureter: Secondary | ICD-10-CM

## 2024-06-29 HISTORY — PX: IR CONVERT BILIARY DRAIN TO INT EXT BILIARY DRAIN: IMG6045

## 2024-06-29 LAB — BASIC METABOLIC PANEL WITH GFR
Anion gap: 16 — ABNORMAL HIGH (ref 5–15)
BUN: 27 mg/dL — ABNORMAL HIGH (ref 8–23)
CO2: 23 mmol/L (ref 22–32)
Calcium: 9.8 mg/dL (ref 8.9–10.3)
Chloride: 101 mmol/L (ref 98–111)
Creatinine, Ser: 1.68 mg/dL — ABNORMAL HIGH (ref 0.44–1.00)
GFR, Estimated: 33 mL/min — ABNORMAL LOW (ref >60)
Glucose, Bld: 147 mg/dL — ABNORMAL HIGH (ref 70–99)
Potassium: 3.7 mmol/L (ref 3.5–5.1)
Sodium: 140 mmol/L (ref 135–145)

## 2024-06-29 LAB — COMPREHENSIVE METABOLIC PANEL WITH GFR
ALT: 16 U/L (ref 0–44)
AST: 29 U/L (ref 15–41)
Albumin: 3.1 g/dL — ABNORMAL LOW (ref 3.5–5.0)
Alkaline Phosphatase: 170 U/L — ABNORMAL HIGH (ref 38–126)
Anion gap: 15 (ref 5–15)
BUN: 28 mg/dL — ABNORMAL HIGH (ref 8–23)
CO2: 22 mmol/L (ref 22–32)
Calcium: 9.7 mg/dL (ref 8.9–10.3)
Chloride: 101 mmol/L (ref 98–111)
Creatinine, Ser: 1.62 mg/dL — ABNORMAL HIGH (ref 0.44–1.00)
GFR, Estimated: 34 mL/min — ABNORMAL LOW (ref >60)
Glucose, Bld: 145 mg/dL — ABNORMAL HIGH (ref 70–99)
Potassium: 3.8 mmol/L (ref 3.5–5.1)
Sodium: 138 mmol/L (ref 135–145)
Total Bilirubin: 1.7 mg/dL — ABNORMAL HIGH (ref 0.0–1.2)
Total Protein: 7.8 g/dL (ref 6.5–8.1)

## 2024-06-29 LAB — HEPATIC FUNCTION PANEL
ALT: 16 U/L (ref 0–44)
AST: 30 U/L (ref 15–41)
Albumin: 3 g/dL — ABNORMAL LOW (ref 3.5–5.0)
Alkaline Phosphatase: 174 U/L — ABNORMAL HIGH (ref 38–126)
Bilirubin, Direct: 0.7 mg/dL — ABNORMAL HIGH (ref 0.0–0.2)
Indirect Bilirubin: 1.2 mg/dL — ABNORMAL HIGH (ref 0.3–0.9)
Total Bilirubin: 1.9 mg/dL — ABNORMAL HIGH (ref 0.0–1.2)
Total Protein: 8.1 g/dL (ref 6.5–8.1)

## 2024-06-29 LAB — CBC WITH DIFFERENTIAL/PLATELET
Abs Immature Granulocytes: 0.06 K/uL (ref 0.00–0.07)
Basophils Absolute: 0 K/uL (ref 0.0–0.1)
Basophils Relative: 0 %
Eosinophils Absolute: 0 K/uL (ref 0.0–0.5)
Eosinophils Relative: 0 %
HCT: 35.6 % — ABNORMAL LOW (ref 36.0–46.0)
Hemoglobin: 11.7 g/dL — ABNORMAL LOW (ref 12.0–15.0)
Immature Granulocytes: 0 %
Lymphocytes Relative: 7 %
Lymphs Abs: 1.2 K/uL (ref 0.7–4.0)
MCH: 27.5 pg (ref 26.0–34.0)
MCHC: 32.9 g/dL (ref 30.0–36.0)
MCV: 83.6 fL (ref 80.0–100.0)
Monocytes Absolute: 1.2 K/uL — ABNORMAL HIGH (ref 0.1–1.0)
Monocytes Relative: 7 %
Neutro Abs: 14.3 K/uL — ABNORMAL HIGH (ref 1.7–7.7)
Neutrophils Relative %: 86 %
Platelets: 373 K/uL (ref 150–400)
RBC: 4.26 MIL/uL (ref 3.87–5.11)
RDW: 15.7 % — ABNORMAL HIGH (ref 11.5–15.5)
WBC: 16.7 K/uL — ABNORMAL HIGH (ref 4.0–10.5)
nRBC: 0 % (ref 0.0–0.2)

## 2024-06-29 LAB — APTT: aPTT: 23 s — ABNORMAL LOW (ref 24–36)

## 2024-06-29 LAB — CBC
HCT: 37 % (ref 36.0–46.0)
Hemoglobin: 12.3 g/dL (ref 12.0–15.0)
MCH: 28.1 pg (ref 26.0–34.0)
MCHC: 33.2 g/dL (ref 30.0–36.0)
MCV: 84.5 fL (ref 80.0–100.0)
Platelets: 421 K/uL — ABNORMAL HIGH (ref 150–400)
RBC: 4.38 MIL/uL (ref 3.87–5.11)
RDW: 15.8 % — ABNORMAL HIGH (ref 11.5–15.5)
WBC: 16 K/uL — ABNORMAL HIGH (ref 4.0–10.5)
nRBC: 0 % (ref 0.0–0.2)

## 2024-06-29 LAB — PROTIME-INR
INR: 1.5 — ABNORMAL HIGH (ref 0.8–1.2)
INR: 1.4 — ABNORMAL HIGH (ref 0.8–1.2)
Prothrombin Time: 19 s — ABNORMAL HIGH (ref 11.4–15.2)
Prothrombin Time: 17.9 s — ABNORMAL HIGH (ref 11.4–15.2)

## 2024-06-29 LAB — LACTIC ACID, PLASMA
Lactic Acid, Venous: 1.3 mmol/L (ref 0.5–1.9)
Lactic Acid, Venous: 1.9 mmol/L (ref 0.5–1.9)

## 2024-06-29 MED ORDER — FENTANYL CITRATE (PF) 100 MCG/2ML IJ SOLN
INTRAMUSCULAR | Status: AC
  Filled 2024-06-29: qty 2

## 2024-06-29 MED ORDER — OXYCODONE HCL 5 MG PO TABS
5.0000 mg | ORAL_TABLET | ORAL | Status: DC | PRN
  Administered 2024-06-30: 5 mg via ORAL
  Filled 2024-06-29: qty 1

## 2024-06-29 MED ORDER — SODIUM CHLORIDE 0.9 % IV SOLN
2.0000 g | INTRAVENOUS | Status: DC
  Filled 2024-06-29: qty 2

## 2024-06-29 MED ORDER — IPRATROPIUM-ALBUTEROL 0.5-2.5 (3) MG/3ML IN SOLN: 3.0000 mL | Freq: Four times a day (QID) | RESPIRATORY_TRACT | Status: DC | PRN

## 2024-06-29 MED ORDER — SODIUM CHLORIDE 0.9 % IV SOLN
2.0000 g | INTRAVENOUS | Status: DC
  Filled 2024-06-29: qty 20

## 2024-06-29 MED ORDER — SODIUM CHLORIDE 0.9 % IV SOLN
2.0000 g | INTRAVENOUS | Status: DC
  Administered 2024-06-29 – 2024-06-30 (×2): 2 g via INTRAVENOUS
  Filled 2024-06-29 (×2): qty 20

## 2024-06-29 MED ORDER — LACTATED RINGERS IV BOLUS (SEPSIS)
1000.0000 mL | Freq: Once | INTRAVENOUS | Status: DC
  Administered 2024-06-29: 1000 mL via INTRAVENOUS

## 2024-06-29 MED ORDER — IOHEXOL 300 MG/ML  SOLN
25.0000 mL | Freq: Once | INTRAMUSCULAR | Status: AC | PRN
  Administered 2024-06-29: 25 mL

## 2024-06-29 MED ORDER — HEPARIN SODIUM (PORCINE) 5000 UNIT/ML IJ SOLN: 5000.0000 [IU] | Freq: Three times a day (TID) | INTRAMUSCULAR | Status: DC

## 2024-06-29 MED ORDER — FENTANYL CITRATE (PF) 100 MCG/2ML IJ SOLN
INTRAMUSCULAR | Status: AC | PRN
  Administered 2024-06-29 (×4): 25 ug via INTRAVENOUS
  Administered 2024-06-29: 50 ug via INTRAVENOUS
  Administered 2024-06-29 (×2): 25 ug via INTRAVENOUS

## 2024-06-29 MED ORDER — LACTATED RINGERS IV SOLN
150.0000 mL/h | INTRAVENOUS | Status: DC
Start: 2024-06-29 — End: 2024-06-29

## 2024-06-29 MED ORDER — LATANOPROST 0.005 % OP SOLN
1.0000 [drp] | Freq: Every day | OPHTHALMIC | Status: DC
  Administered 2024-06-29 – 2024-06-30 (×2): 1 [drp] via OPHTHALMIC
  Filled 2024-06-29: qty 2.5

## 2024-06-29 MED ORDER — METRONIDAZOLE 500 MG/100ML IV SOLN
500.0000 mg | Freq: Two times a day (BID) | INTRAVENOUS | Status: DC
  Administered 2024-06-29 – 2024-06-30 (×3): 500 mg via INTRAVENOUS
  Filled 2024-06-29 (×4): qty 100

## 2024-06-29 MED ORDER — SODIUM CHLORIDE 0.9 % IV SOLN: INTRAVENOUS | Status: DC

## 2024-06-29 MED ORDER — DIAZEPAM 2 MG PO TABS: 2.0000 mg | ORAL_TABLET | Freq: Two times a day (BID) | ORAL | Status: DC | PRN

## 2024-06-29 MED ORDER — FAMOTIDINE 20 MG PO TABS
20.0000 mg | ORAL_TABLET | Freq: Every day | ORAL | Status: DC
  Administered 2024-06-29 – 2024-06-30 (×2): 20 mg via ORAL
  Filled 2024-06-29 (×2): qty 1

## 2024-06-29 MED ORDER — AMIODARONE HCL 200 MG PO TABS
200.0000 mg | ORAL_TABLET | Freq: Every day | ORAL | Status: DC
  Administered 2024-06-29 – 2024-07-01 (×3): 200 mg via ORAL
  Filled 2024-06-29 (×3): qty 1

## 2024-06-29 MED ORDER — MORPHINE SULFATE (PF) 2 MG/ML IV SOLN
2.0000 mg | INTRAVENOUS | Status: DC | PRN
  Administered 2024-06-29 – 2024-06-30 (×4): 2 mg via INTRAVENOUS
  Filled 2024-06-29 (×4): qty 1

## 2024-06-29 MED ORDER — ACETAMINOPHEN 650 MG RE SUPP: 650.0000 mg | Freq: Four times a day (QID) | RECTAL | Status: DC | PRN

## 2024-06-29 MED ORDER — RIVAROXABAN 20 MG PO TABS
20.0000 mg | ORAL_TABLET | Freq: Once | ORAL | Status: AC
  Administered 2024-06-29: 20 mg via ORAL
  Filled 2024-06-29: qty 1

## 2024-06-29 MED ORDER — SODIUM CHLORIDE 0.9 % IV SOLN
INTRAVENOUS | Status: AC | PRN
  Administered 2024-06-29: 2 g via INTRAVENOUS

## 2024-06-29 MED ORDER — LOSARTAN POTASSIUM 25 MG PO TABS
25.0000 mg | ORAL_TABLET | Freq: Every day | ORAL | Status: DC
  Administered 2024-06-29 – 2024-07-01 (×3): 25 mg via ORAL
  Filled 2024-06-29 (×3): qty 1

## 2024-06-29 MED ORDER — ONDANSETRON HCL 4 MG/2ML IJ SOLN
4.0000 mg | Freq: Four times a day (QID) | INTRAMUSCULAR | Status: DC | PRN
  Administered 2024-06-30: 4 mg via INTRAVENOUS
  Filled 2024-06-29: qty 2

## 2024-06-29 MED ORDER — RIVAROXABAN 20 MG PO TABS: 20.0000 mg | ORAL_TABLET | Freq: Every day | ORAL | Status: DC

## 2024-06-29 MED ORDER — MIDAZOLAM HCL 2 MG/2ML IJ SOLN
INTRAMUSCULAR | Status: AC
  Filled 2024-06-29: qty 2

## 2024-06-29 MED ORDER — RIVAROXABAN 20 MG PO TABS
20.0000 mg | ORAL_TABLET | Freq: Every day | ORAL | Status: DC
  Filled 2024-06-29: qty 1

## 2024-06-29 MED ORDER — METOPROLOL TARTRATE 25 MG PO TABS
25.0000 mg | ORAL_TABLET | Freq: Two times a day (BID) | ORAL | Status: DC
  Administered 2024-06-29 – 2024-07-01 (×4): 25 mg via ORAL
  Filled 2024-06-29 (×4): qty 1

## 2024-06-29 MED ORDER — MIDAZOLAM HCL 2 MG/2ML IJ SOLN
INTRAMUSCULAR | Status: AC | PRN
  Administered 2024-06-29: 1 mg via INTRAVENOUS
  Administered 2024-06-29 (×2): .5 mg via INTRAVENOUS

## 2024-06-29 MED ORDER — MIDAZOLAM HCL 2 MG/2ML IJ SOLN
INTRAMUSCULAR | Status: AC
Start: 2024-06-29 — End: 2024-06-29
  Filled 2024-06-29: qty 2

## 2024-06-29 MED ORDER — LACTATED RINGERS IV BOLUS (SEPSIS)
1000.0000 mL | Freq: Once | INTRAVENOUS | Status: AC
  Administered 2024-06-29: 1000 mL via INTRAVENOUS

## 2024-06-29 MED ORDER — LIDOCAINE HCL 1 % IJ SOLN
INTRAMUSCULAR | Status: AC
  Filled 2024-06-29: qty 20

## 2024-06-29 MED ORDER — METRONIDAZOLE 500 MG/100ML IV SOLN
500.0000 mg | Freq: Two times a day (BID) | INTRAVENOUS | Status: DC
  Filled 2024-06-29 (×2): qty 100

## 2024-06-29 MED ORDER — LACTATED RINGERS IV BOLUS (SEPSIS): 1000.0000 mL | Freq: Once | INTRAVENOUS | Status: DC

## 2024-06-29 MED ORDER — ONDANSETRON HCL 4 MG PO TABS: 4.0000 mg | ORAL_TABLET | Freq: Four times a day (QID) | ORAL | Status: DC | PRN

## 2024-06-29 MED ORDER — MIDAZOLAM HCL 5 MG/5ML IJ SOLN
INTRAMUSCULAR | Status: AC | PRN
  Administered 2024-06-29 (×2): .5 mg via INTRAVENOUS

## 2024-06-29 MED ORDER — LIDOCAINE HCL 1 % IJ SOLN
5.0000 mL | Freq: Once | INTRAMUSCULAR | Status: AC
  Administered 2024-06-29: 5 mL via INTRADERMAL

## 2024-06-29 MED ORDER — POLYETHYLENE GLYCOL 3350 17 G PO PACK
17.0000 g | PACK | Freq: Every day | ORAL | Status: DC | PRN
  Administered 2024-07-01: 17 g via ORAL
  Filled 2024-06-29: qty 1

## 2024-06-29 MED ORDER — ACETAMINOPHEN 325 MG PO TABS: 650.0000 mg | ORAL_TABLET | Freq: Four times a day (QID) | ORAL | Status: DC | PRN

## 2024-06-29 NOTE — Plan of Care (Signed)

## 2024-06-29 NOTE — Congregational Nurse Program (Signed)
 Dr. Jenna and McKenzie PA aware of lab results

## 2024-06-29 NOTE — H&P (Signed)
 Chief Complaint: Patient was seen in consultation today for obstructive jaundice   Procedure: Internalization of biliary drain vs new biliary drain placement  Referring Physician(s): Dr. Kandis  Supervising Physician: Jenna Hacker  Patient Status: [facility] - Out-pt  History of Present Illness: Alyssa Leonard is a 68 y.o. female with a history of obesity s/p roux-en-Y and metastatic renal cell carcinoma who presented to the ED in mid-August with complaints of abdominal pain. Found to have obstructive jaundice secondary to intra-abdominal metastases and underwent transhepatic cholangiogram with percutaneous biliary drain placement on 8/13 with Dr. VEAR Lent. At that time the drain was able to be internalized with the catheter in the duodenum. The drain was capped prior to patient discharge and she was instructed to uncap the drain and reach out to IR if she developed any new/worsening symptoms.   About 4 days following discharge, patient called IR with complaints of moderate RUQ/Epigastric pain and stated she did not have an additional bag to attach to the drain. She presented IR for evaluation. At the time the drain flushed easily and was connected to a gravity bag that immediately started draining bile. She was scheduled to return the following week for drain evaluation.   On 8/26 she returned for drain evaluation with Dr. [name]. Unfortunately the drain was almost completely retracted out of the liver and bile ducts. A new drain was able to be advanced into the right intrahepatic ducts and proximal CBD, but was unable to be advanced into the duodenum. She returns today for another attempt at internalization of her biliary drain.   Patient is resting in bed. Reports constant pain in her epigastric region as well as nausea. States that she has had poor oral intake for the past month and has not been taking several of her medications, including amiodarone  or Xarelto , due to stomach pain and  affordability of medications. She also reports worsening fatigue and weakness. She denies any fevers/chills, palpitations, chest pain, shortness of breath, blood in her stool, or changes in her urine. NPO since midnight. All questions and concerns answered at the bedside.   Code Status: Full Code  Past Medical History:  Diagnosis Date   Asthma    Atrial flutter (HCC)    COPD (chronic obstructive pulmonary disease) (HCC)    Diabetes mellitus without complication (HCC)    GERD (gastroesophageal reflux disease)    History of colon polyps    Hypertension    Meniere disease    OSA (obstructive sleep apnea)    Renal cell carcinoma (HCC)    Sleep apnea    Went for sleep study test in January 2018 I haven't heard back from test    Past Surgical History:  Procedure Laterality Date   ABDOMINAL HYSTERECTOMY     BARIATRIC SURGERY     CHOLECYSTECTOMY     COLONOSCOPY WITH PROPOFOL  N/A 12/11/2016   Procedure: COLONOSCOPY WITH PROPOFOL ;  Surgeon: Lamar ONEIDA Holmes, MD;  Location: [facility] ENDOSCOPY;  Service: Endoscopy;  Laterality: N/A;   COLONOSCOPY WITH PROPOFOL  N/A 11/27/2021   Procedure: COLONOSCOPY WITH PROPOFOL ;  Surgeon: Maryruth Ole ONEIDA, MD;  Location: [facility] ENDOSCOPY;  Service: Endoscopy;  Laterality: N/A;   IR EXCHANGE BILIARY DRAIN  06/22/2024   IR INT EXT BILIARY DRAIN WITH CHOLANGIOGRAM  06/09/2024   IR RADIOLOGIST EVAL & MGMT  06/15/2024    Allergies: Latex, Aspirin, and Plasticized base [plastibase]  Medications: Prior to Admission medications   Medication Sig Start Date End Date Taking? Authorizing Provider  acetaminophen  (TYLENOL ) 500 MG tablet Take 1,000 mg by mouth every 6 (six) hours as needed for mild pain (pain score 1-3).    [provider]  albuterol  (PROVENTIL  HFA;VENTOLIN  HFA) 108 (90 Base) MCG/ACT inhaler Inhale 2 puffs into the lungs every 6 (six) hours as needed for wheezing or shortness of breath.    [provider]  amiodarone  (PACERONE ) 200 MG  tablet Take 1 tablet (200 mg total) by mouth daily. 04/20/24   Riddle, Suzann, NP  diazepam  (VALIUM ) 2 MG tablet Take 1 tablet (2 mg total) by mouth 2 (two) times daily as needed for anxiety. Home med. 04/01/24   Awanda City, MD  dibucaine (NUPERCAINAL) 1 % OINT Place 1 Application rectally as needed for hemorrhoids.    [provider]  dorzolamide  (TRUSOPT ) 2 % ophthalmic solution Place 1 drop into both eyes 2 (two) times daily.    [provider]  famotidine  (PEPCID ) 40 MG tablet Take 40 mg by mouth at bedtime. 02/12/24   [provider]  latanoprost  (XALATAN ) 0.005 % ophthalmic solution 1 drop at bedtime.    [provider]  loperamide  (IMODIUM ) 1 MG/5ML solution Take 1 mg by mouth daily as needed for diarrhea or loose stools.    [provider]  losartan  (COZAAR ) 25 MG tablet Take 1 tablet (25 mg total) by mouth daily. Patient taking differently: Take 25 mg by mouth at bedtime. 04/01/24   Awanda City, MD  meclizine  (ANTIVERT ) 25 MG tablet Take 25 mg by mouth 3 (three) times daily as needed for dizziness.    [provider]  metoprolol  tartrate (LOPRESSOR ) 25 MG tablet Take 0.5 tablets (12.5 mg total) by mouth 2 (two) times daily. Patient taking differently: Take 25 mg by mouth 2 (two) times daily. 04/01/24   Awanda City, MD  Multiple Vitamin (MULTIVITAMIN WITH MINERALS) TABS tablet Take 1 tablet by mouth daily. Patient not taking: Reported on 04/20/2024 04/01/24   Awanda City, MD  ondansetron  (ZOFRAN ) 4 MG tablet Take 4 mg by mouth every 8 (eight) hours as needed for nausea or vomiting.    [provider]  rivaroxaban  (XARELTO ) 20 MG TABS tablet Take 1 tablet (20 mg total) by mouth daily. 04/20/24   Riddle, Suzann, NP  sodium chloride  flush 0.9 % SOLN injection Flush biliary cathter with 5-10mL saline daily to prevent clogging 06/11/24   Wouk, Devaughn Sayres, MD  Testosterone 12.5 MG/ACT (1%) GEL Apply 12.5 mg topically daily. 01/05/24   [provider]     Family History  Problem Relation Age of Onset   Hypertension Mother    Kidney disease Sister    Kidney disease Brother    Heart disease Brother    Heart disease Brother    Breast cancer Neg Hx     Social History   Socioeconomic History   Marital status: Married    Spouse name: Not on file   Number of children: Not on file   Years of education: Not on file   Highest education level: Not on file  Occupational History   Not on file  Tobacco Use   Smoking status: Former    Current packs/day: 0.00    Types: Cigarettes    Quit date: 11/14/1993    Years since quitting: 30.6   Smokeless tobacco: Never  Vaping Use   Vaping status: Never Used  Substance and Sexual Activity   Alcohol use: No   Drug use: No   Sexual activity: Not Currently  Other  Topics Concern   Not on file  Social History Narrative   Not on file   Social Drivers of Health   Financial Resource Strain: Low Risk  (06/01/2024)   Received from [facility]   Overall Financial Resource Strain (CARDIA)    Difficulty of Paying Living Expenses: Not hard at all  Food Insecurity: No Food Insecurity (06/09/2024)   Hunger Vital Sign    Worried About Running Out of Food in the Last Year: Never true    Ran Out of Food in the Last Year: Never true  Transportation Needs: No Transportation Needs (06/09/2024)   PRAPARE - Administrator, Civil Service (Medical): No    Lack of Transportation (Non-Medical): No  Physical Activity: Not on file  Stress: Not on file  Social Connections: Socially Integrated (06/09/2024)   Social Connection and Isolation Panel    Frequency of Communication with Friends and Family: More than three times a week    Frequency of Social Gatherings with Friends and Family: More than three times a week    Attends Religious Services: 1 to 4 times per year    Active Member of [employer] or Organizations: No    Attends Engineer, structural: More than 4  times per year    Marital Status: Married  Recent Concern: Social Connections - Moderately Isolated (03/26/2024)   Social Connection and Isolation Panel    Frequency of Communication with Friends and Family: More than three times a week    Frequency of Social Gatherings with Friends and Family: More than three times a week    Attends Religious Services: More than 4 times per year    Active Member of [employer] or Organizations: No    Attends Banker Meetings: Never    Marital Status: Never married    Review of Systems  Gastrointestinal:  Positive for abdominal pain and nausea.  Denies any chest pain, shortness of breath, fevers/chills. All other ROS negative.  Vital Signs: BP 123/74   Pulse (!) 111   Temp (!) 97.2 F (36.2 C) (Temporal)   Resp 15   Ht 5' (1.524 m)   Wt 268 lb (121.6 kg)   SpO2 99%   BMI 52.34 kg/m   Advance Care Plan: The advanced care plan/surrogate decision maker was discussed at the time of visit and documented in the medical record.    Physical Exam Vitals reviewed.  Constitutional:      Appearance: She is obese.     Comments: Appears uncomfortable  HENT:     Mouth/Throat:     Mouth: Mucous membranes are moist.     Pharynx: Oropharynx is clear.  Cardiovascular:     Rate and Rhythm: Regular rhythm. Tachycardia present.     Heart sounds: Normal heart sounds.  Pulmonary:     Effort: Pulmonary effort is normal.     Breath sounds: Normal breath sounds.  Abdominal:     General: Abdomen is flat.     Palpations: Abdomen is soft.     Tenderness: There is abdominal tenderness (epigastric).     Comments: Biliary drain in place with clean/dry overlying bandage; ~100cc bilious output in gravity bag. No tenderness to palpation in the area of the drain site  Musculoskeletal:     Cervical back: Normal range of motion.  Skin:    General: Skin is warm and dry.  Neurological:     Mental Status: She is alert and oriented to person, place, and time.  Psychiatric:        Behavior: Behavior normal.     Imaging: IR EXCHANGE BILIARY DRAIN Result Date: 06/22/2024 INDICATION: Status post percutaneous biliary drainage procedure with placement of 10 French internal/external biliary drainage catheter via right lobe percutaneous biliary access on 06/09/2024. There is significant leakage of bile around the tube exit site currently. EXAM: EXCHANGE OF BILIARY DRAINAGE CATHETER UNDER FLUOROSCOPY INCLUDING CHOLANGIOGRAM MEDICATIONS: None ANESTHESIA/SEDATION: None FLUOROSCOPY: Radiation Exposure Index (as provided by the fluoroscopic device): 541 mGy Kerma CONTRAST:  25 mL Omnipaque  300 COMPLICATIONS: None immediate. PROCEDURE: Informed written consent was obtained from the patient after a thorough discussion of the procedural risks, benefits and alternatives. All questions were addressed. Maximal Sterile Barrier Technique was utilized including caps, mask, sterile gowns, sterile gloves, sterile drape, hand hygiene and skin antiseptic. A timeout was performed prior to the initiation of the procedure. Initial fluoroscopy was performed of the indwelling biliary drainage catheter. The exiting drain was injected with contrast. The drainage catheter was then removed over a guidewire and a 5 French catheter advanced over the wire. Contrast was injected via the catheter to opacify the drain tract. The catheter was then advanced over a hydrophilic guidewire into right intrahepatic bile ducts followed by the common bile duct. The 5 French catheter was further advanced to the level of the duodenum and a guidewire advanced into the duodenum. A 10 French internal/external biliary drainage catheter was attempted to be advanced over various guidewires. Ultimately, a 10 [place] multipurpose drainage catheter was advanced over a guidewire and partially formed. The catheter was injected with contrast to confirm final positioning and attached to a gravity drainage bag. The catheter was  secured at the skin exit site with a Prolene retention suture and StatLock device. FINDINGS: Initial fluoroscopy demonstrates severe retraction of the previously placed internal/external biliary drain nearly out of the liver with some drainage side holes located outside of the costal margin. Injection of contrast barely opacifies the liver. A 5 French catheter was placed and contrast injection did opacify a tract back into right intrahepatic bile ducts. There is a persistent redundant and tortuous tract peripheral to bile ducts outside of the liver. 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 Despite access through to the level of the duodenum, a 10 [place] internal/external biliary drain could not be fully advanced into the duodenum due to buckling of guidewire and catheter outside of the liver which prevented the drain to cross the distal CBD stricture. Ultimately, a standard 10 [place] multipurpose drain was advanced with sideholes predominantly in right-sided intrahepatic ducts and the distal portion of the catheter extending just to the origin of the common bile duct. This drain is draining bile and will be left to gravity drainage. As this drain will not be adequate for long-term drainage, the patient will be brought back for either re-attempted conversion to an internal/external biliary drain or new biliary drain placement via new percutaneous biliary access under moderate IV conscious sedation. IMPRESSION: 1. Previously placed internal/external biliary drainage catheter had retracted nearly out of the liver and bile ducts. 2. Tract back into right intrahepatic bile ducts and across the CBD was not able to be recanalized by a 5 French catheter to the level of the duodenum. 3. A 10 French internal/external biliary drainage catheter could not be advanced into the duodenum  due to buckling  of guidewire and catheter outside of the liver, per venting the drain to cross the CBD. 4. A standard 10 [place] multipurpose drain was advanced into right-sided intrahepatic ducts and just to the level of the origin of the common bile duct. This will be left to external gravity drainage. 5. The patient will be brought back for re-attempted conversion versus new percutaneous biliary drainage catheter placement under moderate IV conscious sedation. Electronically Signed   By: Marcey Moan M.D.   On: 06/22/2024 15:54   IR Radiologist Eval & Mgmt Result Date: 06/16/2024 EXAM: Please see Epic Note CHIEF COMPLAINT: Please see Epic Note HISTORY OF PRESENT ILLNESS: Please see Epic Note REVIEW OF SYSTEMS: Please see Epic Note PHYSICAL EXAMINATION: Please see Epic Note ASSESSMENT AND PLAN: Please see Epic Note Electronically Signed   By: Cordella Banner   On: 06/16/2024 08:12   IR INT EXT BILIARY DRAIN WITH CHOLANGIOGRAM Result Date: 06/09/2024 INDICATION: 68 year old female with obstructed jaundice. CT imaging demonstrates a large right renal mass with possible direct invasion of the region of the distal common bile duct. History of prior gastric bypass with Roux-en-Y anatomy. Patient is therefore not a candidate for ERCP. EXAM: Percutaneous transhepatic cholangiogram with percutaneous biliary drain placement MEDICATIONS: In patient who has received Rocephin  and Flagyl  today. No additional antibiotic prophylaxis was administered. ANESTHESIA/SEDATION: Moderate (conscious) sedation was employed during this procedure. A total of Versed  2.5 mg and Fentanyl  100 mcg was administered intravenously by the radiology nurse. Total intra-service moderate Sedation Time: 56 minutes. The patient's level of consciousness and vital signs were monitored continuously by radiology nursing throughout the procedure under my direct supervision. FLUOROSCOPY: Radiation Exposure Index (as provided by the fluoroscopic  device): 774 mGy Kerma COMPLICATIONS: None immediate. PROCEDURE: Informed written consent was obtained from the patient after a thorough discussion of the procedural risks, benefits and alternatives. All questions were addressed. Maximal Sterile Barrier Technique was utilized including caps, mask, sterile gowns, sterile gloves, sterile drape, hand hygiene and skin antiseptic. A timeout was performed prior to the initiation of the procedure. The liver was interrogated with ultrasound. Biliary ductal dilatation is present. A suitable skin entry site was selected and marked. A small dermatotomy was made. Under real-time ultrasound guidance, a 22 gauge Chiba needle was advanced into a peripheral duct in hepatic segment 3. Transhepatic cholangiography was performed. Diffuse biliary ductal dilatation. Unfortunately, a wire could not be successfully passed into the biliary tree from this approach despite multiple attempts. Therefore, attention was turned to a right intracostal approach. Using standard technique, a 22 gauge 15 cm Chiba needle was advanced over a rib, through the periphery of the liver and used to puncture a tertiary radical within the right ductal system. Contrast injection confirms the needle is within the biliary tree. A wire was passed into the central common bile duct. The needle was removed. The Accustick transitional sheath was advanced over the wire and into the common bile duct. A hydrophilic road runner wire was then successfully navigated through the malignant obstruction and into the duodenum. A 5 French catheter was advanced over the wire and into the duodenum. The hydrophilic wire was exchanged for an Amplatz wire. A Cook 10 French internal/external then advanced over the wire and formed. The locking loop is within the duodenum. The proximal side holes are within the obstructed intrahepatic bile ducts. Images were obtained and stored for the medical record. The tube was connected to bag  drainage and secured to the skin with 0 Prolene suture.  IMPRESSION: 1. 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 2. Successful placement of a 10 French internal/external biliary drainage catheter. PLAN: Maintain drain to bag drainage in till bilirubin has begun trending down. Bag should then be capped to limit fluid and electrolyte losses. Return to interventional Radiology in 6 weeks for tube check and change. Covered self expanding biliary stent placement could be considered in the future. Electronically Signed   By: Wilkie Lent M.D.   On: 06/09/2024 16:28   CT ABDOMEN PELVIS W CONTRAST Result Date: 06/08/2024 CLINICAL DATA:  Acute abdominal pain EXAM: CT ABDOMEN AND PELVIS WITH CONTRAST TECHNIQUE: Multidetector CT imaging of the abdomen and pelvis was performed using the standard protocol following bolus administration of intravenous contrast. RADIATION DOSE REDUCTION: This exam was performed according to the departmental dose-optimization program which includes automated exposure control, adjustment of the mA and/or kV according to patient size and/or use of iterative reconstruction technique. CONTRAST:  OMNIPAQUE  IOHEXOL  350 MG/ML SOLN COMPARISON:  CT abdomen and pelvis 12/25/2023 FINDINGS: Lower chest: No acute abnormality. Hepatobiliary: Gallbladder surgically absent. There is moderate intrahepatic biliary ductal dilatation which has increased from prior. Common bile duct is also moderately dilated, increased from prior. There are scattered rounded hypodensities in the liver which are too small to characterize and unchanged. No new focal liver lesions are identified. Pancreas: Soft tissue mass in the region of the pancreatic head and uncinate process is increased in size now measuring 3.6 x 5.8 cm. Pancreatic body and tail are  within normal limits. There is mild inflammation surrounding the pancreatic head. Spleen: Normal in size without focal abnormality. Adrenals/Urinary Tract: Right renal mass measures 11.2 by 9.2 by 9.7 cm and has increased in size. This abuts the adjacent duodenum with loss of fat plane, progressed from prior. There is dilatation of the right superior pole calyx likely secondary to compression from renal mass. There is no left-sided hydronephrosis. Bilateral renal cysts are stable. There is no perinephric fat stranding. The adrenal glands and bladder are within normal limits. Stomach/Bowel: Stomach is within normal limits. Appendix appears normal. No evidence of bowel wall thickening, distention, or inflammatory changes. There is sigmoid colon diverticulosis. There are postsurgical changes in the stomach. There is loss of fat plane between the pancreatic mass in the gastric antrum and duodenum. Vascular/Lymphatic: Aorta and IVC are normal in size. No enlarged lymph nodes are identified. Reproductive: Status post hysterectomy. No adnexal masses. Other: No abdominal wall hernia or abnormality. No abdominopelvic ascites. Musculoskeletal: No acute or significant osseous findings. IMPRESSION: 1. Interval increase in size of right renal mass worrisome for renal cell carcinoma. 2. Interval increase in size of pancreatic head mass worrisome for metastatic disease or primary pancreatic neoplasm. 3. Interval increase in intra and extrahepatic biliary ductal dilatation likely secondary to obstruction at the level of the pancreatic head mass. 4. Loss of fat plane between the pancreatic mass and gastric antrum and duodenum worrisome for invasion. 5. Mild inflammation surrounding the pancreatic head worrisome for pancreatitis. 6. Diverticulosis. Electronically Signed   By: Greig Pique M.D.   On: 06/08/2024 22:46    Labs:  CBC: Recent Labs    06/09/24 0303 06/10/24 0602 06/10/24 1227 06/11/24 0531 06/29/24 1141  WBC  6.5 6.8  --  7.0 16.0*  HGB 12.1 9.9* 10.3* 9.6* 12.3  HCT 36.5 30.0*  --  28.7* 37.0  PLT  179 141*  --  135* 421*    COAGS: Recent Labs    03/27/24 2057 03/27/24 2104 06/09/24 1256 06/29/24 1141  INR 1.2  --  1.1 1.5*  APTT  --  33  --   --     BMP: Recent Labs    06/09/24 0303 06/10/24 0602 06/11/24 0531 06/29/24 1141  NA 142 143 142 140  K 3.1* 3.7 3.1* 3.7  CL 106 108 110 101  CO2 25 24 26 23   GLUCOSE 116* 95 112* 147*  BUN 12 15 15  27*  CALCIUM  9.1 8.7* 8.9 9.8  CREATININE 0.60 0.70 0.76 1.68*  GFRNONAA >60 >60 >60 33*    LIVER FUNCTION TESTS: Recent Labs    06/08/24 1628 06/09/24 0303 06/10/24 0602 06/11/24 0531  BILITOT 9.8* 10.3* 6.7* 4.0*  AST 140* 130* 78* 49*  ALT 112* 106* 77* 56*  ALKPHOS 450* 446* 349* 347*  PROT 6.3* 6.4* 5.2* 5.5*  ALBUMIN 2.8* 2.8* 2.4* 2.4*    TUMOR MARKERS: No results for input(s): AFPTM, CEA, CA199, CHROMGRNA in the last 8760 hours.  Assessment and Plan:  Obstructive Jaundice secondary to pancreatic head mass: JACKLINE CASTILLA is a 68 y.o. 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 VEAR Lent on 06/09/24. She presented last week for exchange with Dr. [name], but unfortunately drain had been retracted and was unable to be internalized again. She presents today for re-attempt of internalization vs new biliary drain placement with Dr. KANDICE Banner. Procedure to be performed under moderate sedation.  -Patient reports only taking Dilaudid for the past several weeks which does help manage her pain; otherwise unable to take any other prescribed meds -INR 1.5; up from 1.1 on 8/13 -CBC 16.0 up from 12.1 on 8/21 and 7.0 on 8/15 -BUN 27 from 15 on 8/15; Cr 1.68 from 0.76 on 8/15; eGFR 33 down from >60 on 8/15 -Dehydration likely secondary  to continuous bile output from drain  -Per Dr. Banner, will attempt biliary drain exchange and plan for admission to observation with the medicine team vs ER admission   Risks and benefits of biliary drain exchange/placement discussed with the patient including bleeding, infection, damage to adjacent structures, bowel perforation/fistula connection, and sepsis.  All of the patient's questions were answered, patient is agreeable to proceed. Consent signed and in chart.   Thank you for this interesting consult. I greatly enjoyed meeting HELLON VACCARELLA and look forward to participating in their care. A copy of this report was sent to the requesting provider on this date.  Electronically Signed: Jamyla Ard M Enrica Corliss, PA-C 06/29/2024, 12:34 PM   I spent a total of 40 Minutes in face to face clinical consultation, greater than 50% of which was counseling/coordinating care for biliary drain exchange.

## 2024-06-29 NOTE — H&P (Signed)
 History and Physical    Alyssa Leonard FMW:[id number] DOB: 05-02-1956 DOA: 06/29/2024  DOS: the patient was seen and examined on 06/29/2024  PCP: [name] LABOR, MD   Patient coming from: Home  I have personally briefly reviewed patient's old medical records in [facility]  Chief Complaint: Sepsis  HPI: Alyssa Leonard is a pleasant 68 y.o. 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  Patient was found to have a AKI at 1.68 creatinine and BUN 27, white count of 16, hypotension, appearing septic in the radiology suite.  Hospitalist service was consulted for evaluation for admission for IV antibiotic and treatment of sepsis. Patient was seen and examined at bedside in the radiology suite.  Patient was complaining of pain around the drainage site.  She stated that pain is 10/10 in intensity, constant, associate with some nausea and worsened with some movement.  She denies any fever or chills. She was postprocedure exchange of the biliary drainage tube.  She was given Versed  3 mg, fentanyl  200 mcg IV for the procedure.     Review of Systems:  ROS  All other systems negative except as noted in the HPI.  Past Medical History:  Diagnosis Date   Asthma    Atrial flutter (HCC)    COPD (chronic obstructive pulmonary disease) (HCC)    Diabetes mellitus without complication (HCC)    GERD (gastroesophageal reflux disease)    History of colon polyps    Hypertension    Meniere disease    OSA (obstructive sleep apnea)    Renal cell carcinoma (HCC)    Sleep apnea    Went for sleep  study test in January 2018 I haven't heard back from test    Past Surgical History:  Procedure Laterality Date   ABDOMINAL HYSTERECTOMY     BARIATRIC SURGERY     CHOLECYSTECTOMY     COLONOSCOPY WITH PROPOFOL  N/A 12/11/2016   Procedure: COLONOSCOPY WITH PROPOFOL ;  Surgeon: Lamar ONEIDA Holmes, MD;  Location: [facility] END[facility]COPY;  Service: Endoscopy;  Laterality: N/A;   COLONOSCOPY WITH PROPOFOL  N/A 11/27/2021   Procedure: COLONOSCOPY WITH PROPOFOL ;  Surgeon: Maryruth Ole ONEIDA, MD;  Location: [facility] ENDOSCOPY;  Service: Endoscopy;  Laterality: N/A;   IR CONVERT BILIARY DRAIN TO INT EXT BILIARY DRAIN  06/29/2024   IR EXCHANGE BILIARY DRAIN  06/22/2024   IR INT EXT BILIARY DRAIN WITH CHOLANGIOGRAM  06/09/2024   IR RADIOLOGIST EVAL & MGMT  06/15/2024     reports that she quit smoking about 30 years ago. Her smoking use included cigarettes. She has never used smokeless tobacco. She reports that she does not drink alcohol and does not use drugs.  Allergies  Allergen Reactions   Latex Other (See Comments), Dermatitis, Hives and Swelling    unknown  Other reaction(s): Other (See Comments)  unknown  Other reaction(s): Other (See Comments) unknown    unknown   Aspirin Other (See Comments) and Nausea And Vomiting    Makes stomach burn  Other reaction(s): Other (See Comments)  Makes stomach burn  Makes stomach burn    Other reaction(s): Other (See Comments) Makes  stomach burn   Plasticized Base [Plastibase]     Family History  Problem Relation Age of Onset   Hypertension Mother    Kidney disease Sister    Kidney disease Brother    Heart disease Brother    Heart disease Brother    Breast cancer Neg Hx     Prior to Admission medications   Medication Sig Start Date End Date Taking? Authorizing Provider  acetaminophen  (TYLENOL ) 500 MG tablet Take 1,000 mg by mouth every 6 (six) hours as needed for mild pain (pain score 1-3).   Yes [provider]  amiodarone  (PACERONE ) 200 MG  tablet Take 1 tablet (200 mg total) by mouth daily. 04/20/24  Yes Riddle, Suzann, NP  famotidine  (PEPCID ) 40 MG tablet Take 40 mg by mouth at bedtime. 02/12/24  Yes [provider]  HYDROmorphone (DILAUDID) 2 MG tablet Take 2 mg by mouth.   Yes [provider]  losartan  (COZAAR ) 25 MG tablet Take 1 tablet (25 mg total) by mouth daily. Patient taking differently: Take 25 mg by mouth at bedtime. 04/01/24  Yes Awanda City, MD  metoprolol  tartrate (LOPRESSOR ) 25 MG tablet Take 0.5 tablets (12.5 mg total) by mouth 2 (two) times daily. Patient taking differently: Take 25 mg by mouth 2 (two) times daily. 04/01/24  Yes Awanda City, MD  rivaroxaban  (XARELTO ) 20 MG TABS tablet Take 1 tablet (20 mg total) by mouth daily. 04/20/24  Yes Riddle, Suzann, NP  albuterol  (PROVENTIL  HFA;VENTOLIN  HFA) 108 (90 Base) MCG/ACT inhaler Inhale 2 puffs into the lungs every 6 (six) hours as needed for wheezing or shortness of breath.    [provider]  diazepam  (VALIUM ) 2 MG tablet Take 1 tablet (2 mg total) by mouth 2 (two) times daily as needed for anxiety. Home med. 04/01/24   Awanda City, MD  dibucaine (NUPERCAINAL) 1 % OINT Place 1 Application rectally as needed for hemorrhoids.    [provider]  dorzolamide  (TRUSOPT ) 2 % ophthalmic solution Place 1 drop into both eyes 2 (two) times daily.    [provider]  latanoprost  (XALATAN ) 0.005 % ophthalmic solution 1 drop at bedtime.    [provider]  loperamide  (IMODIUM ) 1 MG/5ML solution Take 1 mg by mouth daily as needed for diarrhea or loose stools.    [provider]  meclizine  (ANTIVERT ) 25 MG tablet Take 25 mg by mouth 3 (three) times daily as needed for dizziness.    [provider]  Multiple Vitamin (MULTIVITAMIN WITH MINERALS) TABS tablet Take 1 tablet by mouth daily. Patient not taking: Reported on 04/20/2024 04/01/24   Awanda City, MD  ondansetron  (ZOFRAN ) 4 MG tablet Take 4 mg by mouth every 8 (eight) hours as  needed for nausea or vomiting.    [provider]  sodium chloride  flush 0.9 % SOLN injection Flush biliary cathter with 5-10mL saline daily to prevent clogging 06/11/24   Wouk, Devaughn Sayres, MD  Testosterone 12.5 MG/ACT (1%) GEL Apply 12.5 mg topically daily. 01/05/24   [provider]    Physical Exam: Vitals:   06/29/24 1444 06/29/24 1459 06/29/24 1509 06/29/24 1537  BP: (!) 132/111 (!) 132/111  (!) 141/100  Pulse: (!) 121  (!) 108 (!) 104  Resp: (!) 25 17 14 19   Temp:    (!) 97.5 F (36.4 C)  TempSrc:      SpO2: 98%  100% 100%  Weight:      Height:        Physical Exam  Constitutional: Alert, awake, calm, comfortable HEENT: Neck supple Respiratory: Clear to auscultation B/L, no wheezing, no rales.  Cardiovascular: Regular rate and rhythm, no murmurs / rubs / gallops. No extremity edema. 2+ pedal pulses. No carotid bruits.  Abdomen: Morbidly obese abdomen, biliary drain in place, tender, no signs of peritonitis Musculoskeletal: no clubbing / cyanosis. Good ROM, no contractures. Normal muscle tone.  Skin: no rashes, lesions, ulcers. Neurologic: CN 2-12 grossly intact. Sensation intact, No focal deficit identified Psychiatric: Alert and oriented x 3. Normal mood.    Labs on Admission: I have personally reviewed following labs and imaging studies  CBC: Recent Labs  Lab 06/29/24 1141  WBC 16.0*  HGB 12.3  HCT 37.0  MCV 84.5  PLT 421*   Basic Metabolic Panel: Recent Labs  Lab 06/29/24 1141  NA 140  K 3.7  CL 101  CO2 23  GLUCOSE 147*  BUN 27*  CREATININE 1.68*  CALCIUM  9.8   GFR: Estimated Creatinine Clearance: 38.4 mL/min (A) (by C-G formula based on SCr of 1.68 mg/dL (H)). Liver Function Tests: Recent Labs  Lab 06/29/24 1141  AST 30  ALT 16  ALKPHOS 174*  BILITOT 1.9*  PROT 8.1  ALBUMIN 3.0*   No results for input(s): LIPASE, AMYLASE in the last 168 hours. No results for input(s): AMMONIA in the last 168 hours. Coagulation  Profile: Recent Labs  Lab 06/29/24 1141  INR 1.5*   Cardiac Enzymes: No results for input(s): CKTOTAL, CKMB, CKMBINDEX, TROPONINI, TROPONINIHS in the last 168 hours. BNP (last 3 results) No results for input(s): BNP in the last 8760 hours. HbA1C: No results for input(s): HGBA1C in the last 72 hours. CBG: No results for input(s): GLUCAP in the last 168 hours. Lipid Profile: No results for input(s): CHOL, HDL, LDLCALC, TRIG, CHOLHDL, LDLDIRECT in the last 72 hours. Thyroid  Function Tests: No results for input(s): TSH, T4TOTAL, FREET4, T3FREE, THYROIDAB in the last 72 hours. Anemia Panel: No results for input(s): VITAMINB12, FOLATE, FERRITIN, TIBC, IRON, RETICCTPCT in the last 72 hours. Urine analysis:    Component Value Date/Time   COLORURINE AMBER (A) 06/08/2024 2040   APPEARANCEUR HAZY (A) 06/08/2024 2040   LABSPEC 1.015 06/08/2024 2040   PHURINE 5.0 06/08/2024 2040   GLUCOSEU NEGATIVE 06/08/2024 2040   HGBUR NEGATIVE 06/08/2024 2040   BILIRUBINUR SMALL (A) 06/08/2024 2040   KETONESUR 5 (A) 06/08/2024 2040   PROTEINUR 30 (A) 06/08/2024 2040   NITRITE NEGATIVE 06/08/2024 2040   LEUKOCYTESUR NEGATIVE 06/08/2024 2040    Radiological Exams on Admission: I have personally reviewed images IR CONVERT BILIARY DRAIN TO INT EXT BILIARY DRAIN Result Date: 06/29/2024 INDICATION: Biliary obstruction in a patient with an indwelling biliary drain previously placed 06/09/2024. On 06/22/2024, the patient returned for further evaluation to find that the catheter had partially retracted internally with a redundant loop of the 10 [place] biliary drain extra capsular to the liver. Despite multiple attempts to gain access across the common bile duct obstruction, the biliary drainage catheter would not track across this obstruction and a drain was left in the common bile duct. The patient returns today for further evaluation and potential new access or  converting the catheter to a better positioned internal/external biliary drainage catheter EXAM: Drain exchange COMPARISON:  None Available. ANESTHESIA/SEDATION: Moderate (conscious) sedation was employed during this procedure. A total of Versed  3 mg and Fentanyl  200 mcg was administered intravenously by the radiology nurse. Total intra-service moderate Sedation Time: 30 minutes. The patient's level of consciousness and vital  signs were monitored continuously by radiology nursing throughout the procedure under my direct supervision. CONTRAST:  25 mL Omnipaque  300-administered into the collecting system(s) FLUOROSCOPY: Radiation Exposure Index (as provided by the fluoroscopic device): 406 mGy Kerma COMPLICATIONS: None immediate. PROCEDURE: Informed written consent was obtained from the patient after a thorough discussion of the procedural risks, benefits and alternatives. All questions were addressed. Maximal Sterile Barrier Technique was utilized including caps, mask, sterile gowns, sterile gloves, sterile drape, hand hygiene and skin antiseptic. A timeout was performed prior to the initiation of the procedure. In a supine position, the right upper quadrant and external portion of the catheter were prepped and draped in usual sterile fashion. Dilute contrast was injected into the indwelling biliary drain filling the right lobe bile ducts as well as the common bile duct. Common bile duct is markedly dilated with moderate dilatation of the intrahepatic ducts. Multiple attempts were then made to advance a guidewire through the pigtail catheter and out through the ampulla into the duodenal without success. The pigtail catheter was cut and removed over a Bentson guidewire. An angled glide catheter was then advanced over the Bentson and attempts at reducing the redundant extra capsular loop were unsuccessful. The catheter was then directed toward the ampulla within the common bile duct and using a combination of Glidewire  and Bentson guidewire finally gaining access into the duodenum. Once duodenal access was obtained, contrast was injected verifying intraluminal position within the bowel. The Bentson guidewire was then advanced into the jejunum until enough guidewire was demonstrated to be looping redundantly within the jejunum giving a long length of guidewire position within the small bowel. The glide catheter was then withdrawn. Slow gentle traction was then applied to the guidewire with minimal reduction in the redundant extra capsular loop. Rapid and somewhat forceful traction was then applied to the guidewire and the redundant loop was completely reduced. At this point a 6 French 25 cm sheath was advanced over the guidewire into the common bile duct and directed toward the ampulla. The introducer was then removed and the angled glide catheter was advanced over the guidewire into the jejunum. The guidewire was then removed and exchanged for an Amplatz wire. The sheath and catheter were then completely removed leaving the guidewire in position. An 8 [place] by 40 cm skater biliary drain was advanced over the guidewire and coiled within the duodenal. Contrast was again injected demonstrating final position. Retention suture was applied. Sterile dressing applied. Catheter was connected to gravity drainage. IMPRESSION: Satisfactory reduction of the extra capsular loop and a advancement of the distal tip of the catheter into the bowel completing the internal/external biliary drain placement in a good position for full function. The catheter will remain connected to gravity drainage. The patient is being admitted for tachycardia, worsening renal function and likely underlying sepsis. IR will continue to monitor bilirubin and drain function. Plan for capping once bilirubin has normalized. The patient had previous difficulty with the internal external drain care and may need extra education and support. Short-term follow-up in the  clinic after discharge would likely be beneficial. The patient will be scheduled for a 4 week return for drain change and evaluation as well. Electronically Signed   By: Cordella Banner   On: 06/29/2024 14:14    EKG: N/A    Assessment/Plan Principal Problem:   Sepsis (HCC) Active Problems:   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 [facility])   Mass of head of pancreas   Renal cell carcinoma (HCC)  Paroxysmal atrial flutter (HCC)   Hypertension   Diabetes mellitus without complication (HCC)   OSA (obstructive sleep apnea)   GERD (gastroesophageal reflux disease)    Assessment and Plan: 68 year old female double/PMH of RCC on chemotherapy/immunotherapy at [facility], HTN, DM, morbid obesity, OSA on CPAP, A-fib flutter, obstructive jaundice due to intra-abdominal metastasis s/p biliary drainage presented today from radiology suite for possible sepsis.  1.  Sepsis likely biliary origin - She will be admitted to the hospital as inpatient - She will be given IV fluid for sepsis per protocol - She will be given antibiotic ceftriaxone  and Flagyl  for intra-abdominal sepsis likely biliary in origin - Check urine analysis - She has  leukocytosis at 16, AKI at 1.68, hypertension, tachycardia present on admission - Pain control, IV fluid hydration and antibiotic as mentioned above  2.  AKI - Continue IV fluid - Continue to monitor renal function  3.  RCC with intra-abdominal metastasis s/p biliary drainage - Plan per oncology - Continue drainage  4.  Diet-controlled diabetes - Continue insulin  sliding scale  5.  A flutter/fibrillation - Patient was prescribed amiodarone  and Xarelto  but patient was not able to afford and not taking it - Continue amiodarone  and Xarelto  home dose  6.  Obstructive sleep apnea - Continue bedtime CPAP  7.  HTN - Continue to monitor blood pressure - Losartan  25 mg and metoprolol  25 mg.  8.  GERD - Continue Pepcid      DVT prophylaxis:  Xarelto  Code Status: Full Code Family Communication: No family was available.  Patient was alert awake and oriented.  She wants to be full code. Disposition Plan: Home Consults called: IR Admission status: Inpatient, Telemetry bed   Nena Rebel, MD [facility] 06/29/2024, 4:37 PM

## 2024-06-29 NOTE — Progress Notes (Signed)
 Patient clinically stable post IR biliary drain placement per Dr Jenna, draining green/dk bile from 8 Fr tube at this time to gravity bag. Received Versed  3 mg along with Fentanyl  200 mcg IV for procedure. Pt does state pain is now 4/10 vs 10/10 during procedure. Report given to Paige Johnson Rn post procedure/specials/24. Hospitalist here to evaluate patient for admission with update given.

## 2024-06-30 ENCOUNTER — Other Ambulatory Visit: Payer: Self-pay

## 2024-06-30 ENCOUNTER — Inpatient Hospital Stay

## 2024-06-30 ENCOUNTER — Encounter: Admission: RE | Admit: 2024-06-30 | Payer: Self-pay | Source: Ambulatory Visit

## 2024-06-30 DIAGNOSIS — I4892 Unspecified atrial flutter: Secondary | ICD-10-CM

## 2024-06-30 DIAGNOSIS — R6520 Severe sepsis without septic shock: Secondary | ICD-10-CM

## 2024-06-30 DIAGNOSIS — C641 Malignant neoplasm of right kidney, except renal pelvis: Secondary | ICD-10-CM | POA: Diagnosis not present

## 2024-06-30 DIAGNOSIS — A419 Sepsis, unspecified organism: Secondary | ICD-10-CM

## 2024-06-30 DIAGNOSIS — K831 Obstruction of bile duct: Secondary | ICD-10-CM | POA: Diagnosis not present

## 2024-06-30 DIAGNOSIS — C801 Malignant (primary) neoplasm, unspecified: Secondary | ICD-10-CM

## 2024-06-30 DIAGNOSIS — N179 Acute kidney failure, unspecified: Secondary | ICD-10-CM

## 2024-06-30 LAB — COMPREHENSIVE METABOLIC PANEL WITH GFR
ALT: 16 U/L (ref 0–44)
AST: 34 U/L (ref 15–41)
Albumin: 2.4 g/dL — ABNORMAL LOW (ref 3.5–5.0)
Alkaline Phosphatase: 142 U/L — ABNORMAL HIGH (ref 38–126)
Anion gap: 11 (ref 5–15)
BUN: 26 mg/dL — ABNORMAL HIGH (ref 8–23)
CO2: 22 mmol/L (ref 22–32)
Calcium: 8.7 mg/dL — ABNORMAL LOW (ref 8.9–10.3)
Chloride: 105 mmol/L (ref 98–111)
Creatinine, Ser: 1.29 mg/dL — ABNORMAL HIGH (ref 0.44–1.00)
GFR, Estimated: 45 mL/min — ABNORMAL LOW (ref >60)
Glucose, Bld: 116 mg/dL — ABNORMAL HIGH (ref 70–99)
Potassium: 3.9 mmol/L (ref 3.5–5.1)
Sodium: 138 mmol/L (ref 135–145)
Total Bilirubin: 1.2 mg/dL (ref 0.0–1.2)
Total Protein: 6.3 g/dL — ABNORMAL LOW (ref 6.5–8.1)

## 2024-06-30 LAB — URINALYSIS, COMPLETE (UACMP) WITH MICROSCOPIC
Bacteria, UA: NONE SEEN
Bilirubin Urine: NEGATIVE
Glucose, UA: NEGATIVE mg/dL
Hgb urine dipstick: NEGATIVE
Ketones, ur: 5 mg/dL — AB
Leukocytes,Ua: NEGATIVE
Nitrite: NEGATIVE
Protein, ur: NEGATIVE mg/dL
Specific Gravity, Urine: 1.016 (ref 1.005–1.030)
pH: 5 (ref 5.0–8.0)

## 2024-06-30 LAB — CBC
HCT: 30.3 % — ABNORMAL LOW (ref 36.0–46.0)
Hemoglobin: 10 g/dL — ABNORMAL LOW (ref 12.0–15.0)
MCH: 27.4 pg (ref 26.0–34.0)
MCHC: 33 g/dL (ref 30.0–36.0)
MCV: 83 fL (ref 80.0–100.0)
Platelets: 291 K/uL (ref 150–400)
RBC: 3.65 MIL/uL — ABNORMAL LOW (ref 3.87–5.11)
RDW: 15.8 % — ABNORMAL HIGH (ref 11.5–15.5)
WBC: 12.4 K/uL — ABNORMAL HIGH (ref 4.0–10.5)
nRBC: 0 % (ref 0.0–0.2)

## 2024-06-30 LAB — PROTIME-INR
INR: 4 — ABNORMAL HIGH (ref 0.8–1.2)
Prothrombin Time: 41 s — ABNORMAL HIGH (ref 11.4–15.2)

## 2024-06-30 MED ORDER — ENSURE PLUS HIGH PROTEIN PO LIQD: 237.0000 mL | Freq: Three times a day (TID) | ORAL | Status: DC

## 2024-06-30 MED ORDER — THIAMINE HCL 100 MG PO TABS
100.0000 mg | ORAL_TABLET | Freq: Every day | ORAL | Status: DC
  Administered 2024-06-30 – 2024-07-01 (×2): 100 mg via ORAL
  Filled 2024-06-30 (×3): qty 1

## 2024-06-30 MED ORDER — ADULT MULTIVITAMIN W/MINERALS CH
1.0000 | ORAL_TABLET | Freq: Two times a day (BID) | ORAL | Status: DC
  Administered 2024-06-30 – 2024-07-01 (×2): 1 via ORAL
  Filled 2024-06-30 (×2): qty 1

## 2024-06-30 MED ORDER — APIXABAN 5 MG PO TABS
5.0000 mg | ORAL_TABLET | Freq: Two times a day (BID) | ORAL | Status: DC
  Administered 2024-06-30 – 2024-07-01 (×2): 5 mg via ORAL
  Filled 2024-06-30 (×2): qty 1

## 2024-06-30 NOTE — Progress Notes (Signed)
 Initial Nutrition Assessment  DOCUMENTATION CODES:   Morbid obesity  INTERVENTION:   Ensure Plus High Protein po TID, each supplement provides 350 kcal and 20 grams of protein  MVI po BID   Thiamine  100mg  po daily x 5-7 days   Pt at high refeed risk; recommend monitor potassium, magnesium  and phosphorus labs daily until stable  Daily weights   Pt at high risk for nutrient deficiencies r/t her gastric sleeve surgery. Recommend checking thiamine , B12, folate, iron, calcium , zinc, copper and vitamins D, A, E, & K yearly.   NUTRITION DIAGNOSIS:   Increased nutrient needs related to cancer and cancer related treatments as evidenced by estimated needs.  GOAL:   Patient will meet greater than or equal to 90% of their needs  MONITOR:   PO intake, Supplement acceptance, Labs, Weight trends, Skin, I & O's  REASON FOR ASSESSMENT:   Malnutrition Screening Tool    ASSESSMENT:   68 y/o female with h/o HTN, GERD, hepatitis B, s/p open cholecystectomy (1075), morbid obesity s/p laparoscopic sleeve gastrectomy (2019), DM, PAF, OSA, COPD, goiter, metastatic renal cell carcinoma on Ipilimumab (IPI) and Nivolumab (NIVO) immunotherapy at [facility] and newly diagnosed pancreatic mass with biliary obstruction s/p IR drain placement 8/13 (replaced 8/26 & 9/2) and who is now admitted with sepsis and AKI.  RD working remotely.  Per chart review, pt with poor appetite and oral intake for the past five weeks r/t abdominal pain and nausea secondary to biliary obstruction. Per chart, pt is down 17lbs(6%) since June; this is not significant for the time frame. Pt with h/o gastric sleeve surgery for weight loss. Pt reports that she lost down from 322lbs to 246lbs after her surgery. Pt reportedly has been drinking Ensure at home but unsure if she continues to take her bariatric vitamins. Pt documented to be eating 40-50% of meals in hospital. RD will add supplements and MVI to help pt meet her estimated needs.  Pt is at high refeed risk. RD will obtain history and exam at follow up. Pt is at high risk for developing malnutrition.   Medications reviewed and include: pepcid , NaCl @100ml /hr, ceftriaxone    Labs reviewed: K 3.9 wnl, BUN 26(H), creat 1.29(H) Wbc- 12.4(H), Hgb 10.0(L), Hct 30.3(L) AIC 5.5- 8/13  NUTRITION - FOCUSED PHYSICAL EXAM: Unable to perform at this time   Diet Order:   Diet Order             Diet regular Room service appropriate? Yes; Fluid consistency: Thin  Diet effective now                  EDUCATION NEEDS:   Not appropriate for education at this time  Skin:  Skin Assessment: Reviewed RN Assessment  Last BM:  pta  Height:   Ht Readings from Last 1 Encounters:  06/29/24 5' (1.524 m)    Weight:   Wt Readings from Last 1 Encounters:  06/29/24 121.6 kg    Ideal Body Weight:  45.45 kg  BMI:  Body mass index is 52.34 kg/m.  Estimated Nutritional Needs:   Kcal:  2200-2500kcal/day  Protein:  110-125g/day  Fluid:  1.4-1.6L/day  Augustin Shams MS, RD, LDN If unable to be reached, please send secure chat to RD inpatient available from 8:00a-4:00p daily

## 2024-06-30 NOTE — Progress Notes (Addendum)
 [facility]  - [place] at [facility]   PATIENT NAME: Alyssa Leonard    MR#:  [id number]  DATE OF BIRTH:  [birth year]  SUBJECTIVE:  patient seen earlier. No family at bedside. Feels overall better. Tolerating PO diet. Tells me pain is much better than yesterday right upper quadrant. Biliary drain placement went well per IR.    VITALS:  Blood pressure 113/69, pulse 73, temperature 98.4 F (36.9 C), temperature source Oral, resp. rate 16, height 5' (1.524 m), weight 121.6 kg, SpO2 100%.  PHYSICAL EXAMINATION:   GENERAL:  68 y.o.-year-old patient with no acute distress. Obese LUNGS: Normal breath sounds bilaterally, no wheezing CARDIOVASCULAR: S1, S2 normal. No murmur   ABDOMEN: Soft, nontender, nondistended. Right biliary drain EXTREMITIES: No  edema b/l.    NEUROLOGIC: nonfocal  patient is alert and awake SKIN: No obvious rash, lesion, or ulcer.   LABORATORY PANEL:  CBC Recent Labs  Lab 06/30/24 0337  WBC 12.4*  HGB 10.0*  HCT 30.3*  PLT 291    Chemistries  Recent Labs  Lab 06/30/24 0337  NA 138  K 3.9  CL 105  CO2 22  GLUCOSE 116*  BUN 26*  CREATININE 1.29*  CALCIUM  8.7*  AST 34  ALT 16  ALKPHOS 142*  BILITOT 1.2   Cardiac Enzymes No results for input(s): TROPONINI in the last 168 hours. RADIOLOGY:  IR CONVERT BILIARY DRAIN TO INT EXT BILIARY DRAIN Result Date: 06/29/2024 INDICATION: Biliary obstruction in a patient with an indwelling biliary drain previously placed 06/09/2024. On 06/22/2024, the patient returned for further evaluation to find that the catheter had partially retracted internally with a redundant loop of the 10 [place] biliary drain extra capsular to the liver. Despite multiple attempts to gain access across the common bile duct obstruction, the biliary drainage catheter would not track across this obstruction and a drain was left in the common bile duct. The patient returns today for further evaluation and potential new access or  converting the catheter to a better positioned internal/external biliary drainage catheter EXAM: Drain exchange COMPARISON:  None Available. ANESTHESIA/SEDATION: Moderate (conscious) sedation was employed during this procedure. A total of Versed  3 mg and Fentanyl  200 mcg was administered intravenously by the radiology nurse. Total intra-service moderate Sedation Time: 30 minutes. The patient's level of consciousness and vital signs were monitored continuously by radiology nursing throughout the procedure under my direct supervision. CONTRAST:  25 mL Omnipaque  300-administered into the collecting system(s) FLUOROSCOPY: Radiation Exposure Index (as provided by the fluoroscopic device): 406 mGy Kerma COMPLICATIONS: None immediate. PROCEDURE: Informed written consent was obtained from the patient after a thorough discussion of the procedural risks, benefits and alternatives. All questions were addressed. Maximal Sterile Barrier Technique was utilized including caps, mask, sterile gowns, sterile gloves, sterile drape, hand hygiene and skin antiseptic. A timeout was performed prior to the initiation of the procedure. In a supine position, the right upper quadrant and external portion of the catheter were prepped and draped in usual sterile fashion. Dilute contrast was injected into the indwelling biliary drain filling the right lobe bile ducts as well as the common bile duct. Common bile duct is markedly dilated with moderate dilatation of the intrahepatic ducts. Multiple attempts were then made to advance a guidewire through the pigtail catheter and out through the ampulla into the duodenal without success. The pigtail catheter was cut and removed over a Bentson guidewire. An angled glide catheter was then advanced over the Bentson and attempts at reducing the  redundant extra capsular loop were unsuccessful. The catheter was then directed toward the ampulla within the common bile duct and using a combination of Glidewire  and Bentson guidewire finally gaining access into the duodenum. Once duodenal access was obtained, contrast was injected verifying intraluminal position within the bowel. The Bentson guidewire was then advanced into the jejunum until enough guidewire was demonstrated to be looping redundantly within the jejunum giving a long length of guidewire position within the small bowel. The glide catheter was then withdrawn. Slow gentle traction was then applied to the guidewire with minimal reduction in the redundant extra capsular loop. Rapid and somewhat forceful traction was then applied to the guidewire and the redundant loop was completely reduced. At this point a 6 French 25 cm sheath was advanced over the guidewire into the common bile duct and directed toward the ampulla. The introducer was then removed and the angled glide catheter was advanced over the guidewire into the jejunum. The guidewire was then removed and exchanged for an Amplatz wire. The sheath and catheter were then completely removed leaving the guidewire in position. An 8 [place] by 40 cm skater biliary drain was advanced over the guidewire and coiled within the duodenal. Contrast was again injected demonstrating final position. Retention suture was applied. Sterile dressing applied. Catheter was connected to gravity drainage. IMPRESSION: Satisfactory reduction of the extra capsular loop and a advancement of the distal tip of the catheter into the bowel completing the internal/external biliary drain placement in a good position for full function. The catheter will remain connected to gravity drainage. The patient is being admitted for tachycardia, worsening renal function and likely underlying sepsis. IR will continue to monitor bilirubin and drain function. Plan for capping once bilirubin has normalized. The patient had previous difficulty with the internal external drain care and may need extra education and support. Short-term follow-up in the  clinic after discharge would likely be beneficial. The patient will be scheduled for a 4 week return for drain change and evaluation as well. Electronically Signed   By: Cordella Banner   On: 06/29/2024 14:14    Assessment and Plan   Alyssa Leonard is a pleasant 68 y.o. 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  Patient was found to have a AKI at 1.68 creatinine and BUN 27, white count of 16, hypotension, appearing septic in the radiology suite.  Hospitalist service was consulted for evaluation for admission for IV antibiotic and treatment of sepsis.    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 s/p biliary drainage - Plan per oncology - Continue drainage --9/2-- patient underwent repeat biliary drainage placement by interventional  radiology   Diet-controlled diabetes - Continue insulin  sliding scale   A flutter/fibrillation - Patient was prescribed amiodarone  and Xarelto  but patient was not able to afford and not taking it - Continue amiodarone  and Xarelto  home dose -- I have asked social  worker discussed with patient to see if he can get some help    Obstructive sleep apnea - Continue bedtime CPAP   HTN - Continue to monitor blood pressure - Losartan  25 mg and metoprolol  25 mg.    GERD - Continue Pepcid      Procedures: biliary drain placement by IR on September 2 Family communication : none today Consults : interventional radiology CODE STATUS: full DVT Prophylaxis : Xarelto  Level of care: Telemetry Medical Status is: Inpatient Remains inpatient appropriate because: continue monitor for one more day    TOTAL TIME TAKING CARE OF THIS PATIENT: 45 minutes.  >50% time spent on counselling and coordination of care  Note: This dictation was prepared with Dragon dictation along with smaller phrase technology. Any transcriptional errors that result from this process are unintentional.  Leita Blanch M.D    [facility]   CC: Primary care physician; Entzminger, [place] LABOR, MD

## 2024-06-30 NOTE — Discharge Instructions (Signed)
 Interventional Radiology Percutaneous Abscess Drain Placement After Care  This sheet gives you information about how to care for yourself after your procedure. Your health care provider may also give you more specific instructions. Your drain was placed by an interventional radiologist with [facility]. If you have questions or concerns, contact [facility] at [phone]. What is a percutaneous drain? A drain is a small plastic tube (catheter) that goes into the fluid collection in your body through your skin. How long will I need the drain? How long the drain needs to stay in is determined by where the drain is, how much comes out of the drain each day and if you are having any other surgical procedures. Interventional radiology will determine when it is time to remove the drain. It is important to follow up as directed so that the drain can be removed as soon as it is safe to do so. What can I expect after the procedure? After the procedure, it is common to have: A small amount of bruising and discomfort in the area where the drainage tube (catheter) was placed. Sleepiness and fatigue. This should go away after the medicines you were given have worn off. Follow these instructions at home: Insertion site care Check your insertion site when you change the bandage. Check for: More redness, swelling, or pain. More fluid or blood. Warmth. Pus or a bad smell. When caring for your insertion site: Wash your hands with soap and water  for at least 20 seconds before and after you change your bandage (dressing). If soap and water  are not available, use hand sanitizer. You do not need to change your dressing everyday if it is clean and dry. Change your dressing every 3 days or as needed when it is soiled, wet or becoming dislodged. You will need to change your dressing each time you shower. Leave stitches (sutures), skin glue, or adhesive strips in place. These skin closures may need  to stay in place for 2 weeks or longer. If adhesive strip edges start to loosen and curl up, you may trim the loose edges. Do not remove adhesive strips completely unless your health care provider tells you to do so.  Catheter care Flush the catheter once per day with 5 mL of 0.9% normal saline unless you are told otherwise by your healthcare provider. This helps to prevent clogs in the catheter. To disconnect the drain, turn the clear plastic tube to the left. Attach the saline syringe by placing it on the white end of the drain and turning gently to the right. Once attached gently push the plunger to the 5 mL mark. After you are done flushing, disconnect the syringe by turning to the left and reattach your drainage container    If you have a bulb please be sure the bulb is charged after reconnecting it - to do this pinch the bulb between your thumb and first finger and close the stopper located on the top of the bulb.   Check for fluid leaking from around your catheter (instead of fluid draining through your catheter). This may be a sign that the drain is no longer working correctly. Write down the following information every time you empty your bag: The date and time. The amount of drainage. Activity Rest at home for 1-2 days after your procedure. For the first 48 hours do not lift anything more than 10 lbs (about a gallon of milk). You may perform moderate activities/exercise. Please avoid strenuous activities during this  time. Avoid any activities which may pull on your drain as this can cause your drain to become dislodged. If you were given a sedative during the procedure, it can affect you for several hours. Do not drive or operate machinery until your health care provider says that it is safe. General instructions For mild pain take over-the-counter medications as needed for pain such as Tylenol  or Advil. If you are experiencing severe pain please call our office as this may indicate an  issue with your drain.  If you were prescribed an antibiotic medicine, take it as told by your health care provider. Do not stop using the antibiotic even if you start to feel better. You may shower 24 hours after the drain is placed. To do this cover the insertion site with a water  tight material such as saran wrap and seal the edges with tape, you may also purchase waterproof dressings at your local drug store. Shower as usual and then remove the water  tight dressing and any gauze/tape underneath it once you have exited the shower and dried off. Allow the area to air dry or pat dry with a clean towel. Once the skin is completely dry place a new gauze dressing. It is important to keep the site dry at all times to prevent infection. Do not submerge the drain - this means you cannot take baths, swim, use a hot tub, etc. until the drain is removed.  Do not use any products that contain nicotine or tobacco, such as cigarettes, e-cigarettes, and chewing tobacco. If you need help quitting, ask your health care provider. Keep all follow-up visits as told by your health care provider. This is important. Contact a health care provider if: You have less than 10 mL of drainage a day for 2-3 days in a row, or as directed by your health care provider. You have any of these signs of infection: More redness, swelling, or pain around your incision area. More fluid or blood coming from your incision area. Warmth coming from your incision area. Pus or a bad smell coming from your incision area. You have fluid leaking from around your catheter (instead of through your catheter). You are unable to flush the drain. You have a fever or chills. You have pain that does not get better with medicine. You have not been contacted to schedule a drain follow up appointment within 10 days of discharge from the hospital. Please call [facility] at [phone] with any questions or concerns. Get help right away  if: Your catheter comes out. You suddenly stop having drainage from your catheter. You suddenly have blood in the fluid that is draining from your catheter. You become dizzy or you faint. You develop a rash. You have nausea or vomiting. You have difficulty breathing or you feel short of breath. You develop chest pain. You have problems with your speech or vision. You have trouble balancing or moving your arms or legs. Summary It is common to have a small amount of bruising and discomfort in the area where the drainage tube (catheter) was placed. You may also have minor discomfort with movement while the drain is in place. Flush the drain once per day with 5 mL of 0.9% normal saline (unless you were told otherwise by your healthcare provider).  Record the amount of drainage from the bag every time you empty it. Change the dressing every 3 days or earlier if soiled/wet. Keep the skin dry under the dressing. You may shower with  the drain in place. Do not submerge the drain (no baths, swimming, hot tubs, etc.). Contact [facility] at [phone] if you have more redness, swelling, or pain around your incision area or if you have pain that does not get better with medicine. This information is not intended to replace advice given to you by your health care provider. Make sure you discuss any questions you have with your health care provider. Document Revised: 01/17/2022 Document Reviewed: 10/09/2019 Elsevier Patient Education  2023 [employer]    Interventional Radiology Drain Record Empty your drain at least once per day. You may empty it as often as needed. Use this form to write down the amount of fluid that has collected in the drainage container. Bring this form with you to your follow-up visits. Please call [facility] at [phone] with any questions or concerns prior to your appointment.  Drain #1 location: ___________________ Date __________ Time __________  Amount __________ Date __________ Time __________ Amount __________ Date __________ Time __________ Amount __________ Date __________ Time __________ Amount __________ Date __________ Time __________ Amount __________ Date __________ Time __________ Amount __________ Date __________ Time __________ Amount __________ Date __________ Time __________ Amount __________ Date __________ Time __________ Amount __________ Date __________ Time __________ Amount __________ Date __________ Time __________ Amount __________ Date __________ Time __________ Amount __________ Date __________ Time __________ Amount __________ Date __________ Time __________ Amount __________     Robie #2 location: ___________________ Date __________ Time __________ Amount __________ Date __________ Time __________ Amount __________ Date __________ Time __________ Amount __________ Date __________ Time __________ Amount __________ Date __________ Time __________ Amount __________ Date __________ Time __________ Amount __________ Date __________ Time __________ Amount __________ Date __________ Time __________ Amount __________ Date __________ Time __________ Amount __________ Date __________ Time __________ Amount __________ Date __________ Time __________ Amount __________ Date __________ Time __________ Amount __________ Date __________ Time __________ Amount __________ Date __________ Time __________ Amount __________

## 2024-07-01 ENCOUNTER — Other Ambulatory Visit: Payer: Self-pay

## 2024-07-01 DIAGNOSIS — E119 Type 2 diabetes mellitus without complications: Secondary | ICD-10-CM | POA: Diagnosis not present

## 2024-07-01 DIAGNOSIS — K831 Obstruction of bile duct: Secondary | ICD-10-CM | POA: Diagnosis not present

## 2024-07-01 DIAGNOSIS — A419 Sepsis, unspecified organism: Secondary | ICD-10-CM | POA: Diagnosis not present

## 2024-07-01 DIAGNOSIS — C641 Malignant neoplasm of right kidney, except renal pelvis: Secondary | ICD-10-CM | POA: Diagnosis not present

## 2024-07-01 LAB — PHOSPHORUS: Phosphorus: 3.2 mg/dL (ref 2.5–4.6)

## 2024-07-01 LAB — BASIC METABOLIC PANEL WITH GFR
Anion gap: 9 (ref 5–15)
BUN: 25 mg/dL — ABNORMAL HIGH (ref 8–23)
CO2: 23 mmol/L (ref 22–32)
Calcium: 8.8 mg/dL — ABNORMAL LOW (ref 8.9–10.3)
Chloride: 108 mmol/L (ref 98–111)
Creatinine, Ser: 1.33 mg/dL — ABNORMAL HIGH (ref 0.44–1.00)
GFR, Estimated: 44 mL/min — ABNORMAL LOW (ref >60)
Glucose, Bld: 129 mg/dL — ABNORMAL HIGH (ref 70–99)
Potassium: 4 mmol/L (ref 3.5–5.1)
Sodium: 140 mmol/L (ref 135–145)

## 2024-07-01 LAB — MAGNESIUM: Magnesium: 2.1 mg/dL (ref 1.7–2.4)

## 2024-07-01 MED ORDER — AMOXICILLIN-POT CLAVULANATE 875-125 MG PO TABS: 1.0000 | ORAL_TABLET | Freq: Two times a day (BID) | ORAL | Status: DC

## 2024-07-01 MED ORDER — OXYCODONE HCL 5 MG PO TABS
5.0000 mg | ORAL_TABLET | Freq: Two times a day (BID) | ORAL | 0 refills | Status: DC | PRN
  Filled 2024-07-01: qty 15, 8d supply, fill #0

## 2024-07-01 MED ORDER — ENSURE PLUS HIGH PROTEIN PO LIQD
237.0000 mL | Freq: Three times a day (TID) | ORAL | 0 refills | Status: DC
Start: 2024-07-01 — End: 2024-08-03
  Filled 2024-07-01: qty 237, 1d supply, fill #0

## 2024-07-01 MED ORDER — APIXABAN 5 MG PO TABS
5.0000 mg | ORAL_TABLET | Freq: Two times a day (BID) | ORAL | 3 refills | Status: AC
  Filled 2024-07-01: qty 60, 30d supply, fill #0

## 2024-07-01 MED ORDER — AMOXICILLIN-POT CLAVULANATE 875-125 MG PO TABS
1.0000 | ORAL_TABLET | Freq: Two times a day (BID) | ORAL | 0 refills | Status: AC
  Filled 2024-07-01: qty 6, 3d supply, fill #0

## 2024-07-01 NOTE — Discharge Instructions (Signed)

## 2024-07-01 NOTE — Discharge Summary (Signed)
 Physician Discharge Summary   Patient: Alyssa Leonard MRN: [id number] DOB: Aug 26, 1956  Admit date:     06/29/2024  Discharge date: 07/01/2024  Discharge Physician: Leita Blanch   PCP: [name] LABOR, MD   Recommendations at discharge:   patient to follow-up with [facility] electrophysiology Devere Needle, nurse practitioner on the appointment that will be set up follow-up with [facility] cardiology Dr. Ricka as needed follow-up PCP in 1 to 2 weeks patient is recommended to keep her follow-up appointments at [facility] for various reasons including her oncology appointments.  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  Patient was found to have a AKI at 1.68 creatinine and BUN 27, white count of 16, hypotension, appearing septic in the radiology suite.  Hospitalist service was consulted for evaluation for admission for IV antibiotic and treatment of sepsis.      Sepsis-- unknown etiology --received IV fluid for sepsis per protocol - She will be given antibiotic ceftriaxone  and Flagyl  for intra-abdominal sepsis likely biliary in origin -BC so far neg, no fever and BP stable - She has  leukocytosis at 16, AKI  at 1.68, hypertension, tachycardia present on admission - Pain control, IV fluid hydration and antibiotic as mentioned above --9/4-- remains afebrile. Will switch to oral antibiotic complete five day course. 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   Diet-controlled diabetes - Continue insulin  sliding scale   A flutter/fibrillation - Patient was prescribed amiodarone  and Xarelto  but patient was not able to afford and not taking it - Continue amiodarone  and Xarelto  home dose -- I have asked social worker discussed with patient to see if he can get some help --9/4-- discussed with hospital pharmacist and patient regarding affordability for oral anticoagulation. Limited options and hence will discharge patient on eliquis  5 mg BID (free one month). Pharmacy department did help patient fill out assistance program paperwork. I did discussed with outpatient cardiology at [facility] Albino Needle, NP aware) and they are aware to follow-up regarding the paperwork done. If patient does not get accepted then consider warfarin as outpatient.   Obstructive sleep apnea --Continue bedtime CPAP   HTN - Continue to monitor blood pressure - Losartan  25 mg and metoprolol  25 mg.    GERD - Continue Pepcid   Procedures: biliary drain placement by IR on September 2 Family communication : none today Consults : interventional radiology CODE STATUS: full DVT Prophylaxis :eliquis   Discharge patient home     Pain control - [place]   Controlled Substance Reporting System database was reviewed. and patient was instructed, not to drive, operate heavy machinery, perform activities at heights, swimming or participation in water  activities or provide baby-sitting services while on Pain, Sleep and Anxiety Medications; until their outpatient Physician has advised to do so again. Also recommended to not to take more than prescribed Pain, Sleep and Anxiety Medications.  Disposition: Home Diet recommendation:  Discharge Diet Orders (From admission, onward)     Start     Ordered   07/01/24 0000  Diet - low sodium heart healthy        07/01/24 0959           Cardiac and Carb modified diet DISCHARGE MEDICATION: Allergies as of 07/01/2024       Reactions   Latex Other (See Comments), Dermatitis, Hives, Swelling   unknown Other reaction(s): Other (See Comments)  unknown Other reaction(s): Other (See Comments) unknown    unknown   Aspirin Other (See Comments), Nausea And Vomiting   Makes stomach burn Other reaction(s): Other (See Comments)  Makes stomach burn Makes stomach burn    Other reaction(s): Other (See Comments) Makes stomach burn   Plasticized Base [plastibase]         Medication List     STOP taking these medications    HYDROmorphone 2 MG tablet Commonly known as: DILAUDID   multivitamin with minerals Tabs tablet   rivaroxaban  20 MG Tabs tablet Commonly known as: XARELTO        TAKE these medications    acetaminophen  500 MG tablet Commonly known as: TYLENOL  Take 1,000 mg by mouth every 6 (six) hours as needed for mild pain (pain score 1-3).   albuterol  108 (90 Base) MCG/ACT inhaler Commonly known as: VENTOLIN  HFA Inhale 2 puffs into the lungs every 6 (six) hours as needed for wheezing or shortness of breath.   amiodarone  200 MG tablet Commonly known as: PACERONE  Take 1 tablet (200 mg total) by mouth daily.   amoxicillin -clavulanate 875-125 MG tablet Commonly known as: AUGMENTIN  Take 1  tablet by mouth every 12 (twelve) hours for 6 doses.   apixaban  5 MG Tabs tablet Commonly known as: ELIQUIS  Take 1 tablet (5 mg total) by mouth 2 (two) times daily.   diazepam  2 MG tablet Commonly known as: VALIUM  Take 1 tablet (2 mg total) by mouth 2 (two) times daily as needed for anxiety. Home med.   dibucaine 1 % Oint Commonly known as: NUPERCAINAL Place 1 Application rectally as needed for hemorrhoids.   dorzolamide  2 % ophthalmic solution Commonly known as: TRUSOPT  Place 1 drop into both eyes 2 (two) times daily.   famotidine  40 MG tablet Commonly known as: PEPCID  Take 40 mg by mouth at bedtime.   feeding supplement Liqd Take 237 mLs by mouth 3 (three) times daily between meals.   latanoprost  0.005 % ophthalmic solution Commonly known as: XALATAN  1 drop at bedtime.   loperamide  1 MG/5ML solution Commonly known as: IMODIUM  Take 1 mg by mouth daily as needed for diarrhea or loose stools.   losartan  25 MG tablet Commonly known as: COZAAR  Take 1 tablet (25 mg total) by mouth daily. What changed: when to take this   meclizine  25 MG tablet Commonly known as: ANTIVERT  Take 25 mg by mouth 3 (  three) times daily as needed for dizziness.   metoprolol  tartrate 25 MG tablet Commonly known as: LOPRESSOR  Take 0.5 tablets (12.5 mg total) by mouth 2 (two) times daily. What changed: how much to take   ondansetron  4 MG tablet Commonly known as: ZOFRAN  Take 4 mg by mouth every 8 (eight) hours as needed for nausea or vomiting.   oxyCODONE  5 MG immediate release tablet Commonly known as: Oxy IR/ROXICODONE  Take 1 tablet (5 mg total) by mouth every 12 (twelve) hours as needed for moderate pain (pain score 4-6).   sodium chloride  flush 0.9 % Soln injection Flush biliary cathter with 5-10mL saline daily to prevent clogging   Testosterone 12.5 MG/ACT (1%) Gel Apply 12.5 mg topically daily.        Follow-up Information     Entzminger, Ethridge LABOR, MD. Schedule an  appointment as soon as possible for a visit in 1 week(s).   Specialty: Internal Medicine Contact information: [address] [place] [address] [phone]         Darron Deatrice LABOR, MD Follow up in 3 week(s).   Specialty: Cardiology Why: afib/flutter Contact information: [address] [place] [address] [phone]                Discharge Exam: Fredricka Weights   06/29/24 1151 07/01/24 0401  Weight: 121.6 kg 112.7 kg  GENERAL:  68 y.o.-year-old patient with no acute distress. Obese LUNGS: Normal breath sounds bilaterally, no wheezing CARDIOVASCULAR: S1, S2 normal. No murmur   ABDOMEN: Soft, nontender, nondistended. Right biliary drain EXTREMITIES: No  edema b/l.    NEUROLOGIC: nonfocal  patient is alert and awake   Condition at discharge: fair  The results of significant diagnostics from this hospitalization (including imaging, microbiology, ancillary and laboratory) are listed below for reference.   Imaging Studies: DG Abd 2 Views Result Date: 07/01/2024 CLINICAL DATA:  Constipation. EXAM: ABDOMEN - 2 VIEW COMPARISON:  CT 06/08/2024 FINDINGS: No bowel dilatation or evidence of obstruction. Air and small volume of stool throughout nondilated colon. Small volume of stool in the rectum. Multiple surgical clips in the left upper quadrant. A few surgical clips in the pelvis. Right upper quadrant biliary stent. No free intra-abdominal air. IMPRESSION: Small volume formed stool in the colon. No bowel obstruction. Electronically Signed   By: Andrea Gasman M.D.   On: 07/01/2024 00:03   IR CONVERT BILIARY DRAIN TO INT EXT BILIARY DRAIN Result Date: 06/29/2024 INDICATION: Biliary obstruction in a patient with an indwelling biliary drain previously placed 06/09/2024. On 06/22/2024, the patient returned for further evaluation to find that the catheter had partially retracted internally with a redundant loop of the 10 [place] biliary drain extra capsular to the  liver. Despite multiple attempts to gain access across the common bile duct obstruction, the biliary drainage catheter would not track across this obstruction and a drain was left in the common bile duct. The patient returns today for further evaluation and potential new access or converting the catheter to a better positioned internal/external biliary drainage catheter EXAM: Drain exchange COMPARISON:  None Available. ANESTHESIA/SEDATION: Moderate (conscious) sedation was employed during this procedure. A total of Versed  3 mg and Fentanyl  200 mcg was administered intravenously by the radiology nurse. Total intra-service moderate Sedation Time: 30 minutes. The patient's level of consciousness and vital signs were monitored continuously by radiology nursing throughout the procedure under my direct supervision. CONTRAST:  25 mL Omnipaque  300-administered into the collecting system(s) FLUOROSCOPY: Radiation Exposure Index (as  provided by the fluoroscopic device): 406 mGy Kerma COMPLICATIONS: None immediate. PROCEDURE: Informed written consent was obtained from the patient after a thorough discussion of the procedural risks, benefits and alternatives. All questions were addressed. Maximal Sterile Barrier Technique was utilized including caps, mask, sterile gowns, sterile gloves, sterile drape, hand hygiene and skin antiseptic. A timeout was performed prior to the initiation of the procedure. In a supine position, the right upper quadrant and external portion of the catheter were prepped and draped in usual sterile fashion. Dilute contrast was injected into the indwelling biliary drain filling the right lobe bile ducts as well as the common bile duct. Common bile duct is markedly dilated with moderate dilatation of the intrahepatic ducts. Multiple attempts were then made to advance a guidewire through the pigtail catheter and out through the ampulla into the duodenal without success. The pigtail catheter was cut and  removed over a Bentson guidewire. An angled glide catheter was then advanced over the Bentson and attempts at reducing the redundant extra capsular loop were unsuccessful. The catheter was then directed toward the ampulla within the common bile duct and using a combination of Glidewire and Bentson guidewire finally gaining access into the duodenum. Once duodenal access was obtained, contrast was injected verifying intraluminal position within the bowel. The Bentson guidewire was then advanced into the jejunum until enough guidewire was demonstrated to be looping redundantly within the jejunum giving a long length of guidewire position within the small bowel. The glide catheter was then withdrawn. Slow gentle traction was then applied to the guidewire with minimal reduction in the redundant extra capsular loop. Rapid and somewhat forceful traction was then applied to the guidewire and the redundant loop was completely reduced. At this point a 6 French 25 cm sheath was advanced over the guidewire into the common bile duct and directed toward the ampulla. The introducer was then removed and the angled glide catheter was advanced over the guidewire into the jejunum. The guidewire was then removed and exchanged for an Amplatz wire. The sheath and catheter were then completely removed leaving the guidewire in position. An 8 [place] by 40 cm skater biliary drain was advanced over the guidewire and coiled within the duodenal. Contrast was again injected demonstrating final position. Retention suture was applied. Sterile dressing applied. Catheter was connected to gravity drainage. IMPRESSION: Satisfactory reduction of the extra capsular loop and a advancement of the distal tip of the catheter into the bowel completing the internal/external biliary drain placement in a good position for full function. The catheter will remain connected to gravity drainage. The patient is being admitted for tachycardia, worsening renal  function and likely underlying sepsis. IR will continue to monitor bilirubin and drain function. Plan for capping once bilirubin has normalized. The patient had previous difficulty with the internal external drain care and may need extra education and support. Short-term follow-up in the clinic after discharge would likely be beneficial. The patient will be scheduled for a 4 week return for drain change and evaluation as well. Electronically Signed   By: Cordella Banner   On: 06/29/2024 14:14   IR EXCHANGE BILIARY DRAIN Result Date: 06/22/2024 INDICATION: Status post percutaneous biliary drainage procedure with placement of 10 French internal/external biliary drainage catheter via right lobe percutaneous biliary access on 06/09/2024. There is significant leakage of bile around the tube exit site currently. EXAM: EXCHANGE OF BILIARY DRAINAGE CATHETER UNDER FLUOROSCOPY INCLUDING CHOLANGIOGRAM MEDICATIONS: None ANESTHESIA/SEDATION: None FLUOROSCOPY: Radiation Exposure Index (as provided by the fluoroscopic  device): 541 mGy Kerma CONTRAST:  25 mL Omnipaque  300 COMPLICATIONS: None immediate. PROCEDURE: Informed written consent was obtained from the patient after a thorough discussion of the procedural risks, benefits and alternatives. All questions were addressed. Maximal Sterile Barrier Technique was utilized including caps, mask, sterile gowns, sterile gloves, sterile drape, hand hygiene and skin antiseptic. A timeout was performed prior to the initiation of the procedure. Initial fluoroscopy was performed of the indwelling biliary drainage catheter. The exiting drain was injected with contrast. The drainage catheter was then removed over a guidewire and a 5 French catheter advanced over the wire. Contrast was injected via the catheter to opacify the drain tract. The catheter was then advanced over a hydrophilic guidewire into right intrahepatic bile ducts followed by the common bile duct. The 5 French catheter was  further advanced to the level of the duodenum and a guidewire advanced into the duodenum. A 10 French internal/external biliary drainage catheter was attempted to be advanced over various guidewires. Ultimately, a 10 [place] multipurpose drainage catheter was advanced over a guidewire and partially formed. The catheter was injected with contrast to confirm final positioning and attached to a gravity drainage bag. The catheter was secured at the skin exit site with a Prolene retention suture and StatLock device. FINDINGS: Initial fluoroscopy demonstrates severe retraction of the previously placed internal/external biliary drain nearly out of the liver with some drainage side holes located outside of the costal margin. Injection of contrast barely opacifies the liver. A 5 French catheter was placed and contrast injection did opacify a tract back into right intrahepatic bile ducts. There is a persistent redundant and tortuous tract peripheral to bile ducts outside of the liver. 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 Despite access through to the level of the duodenum, a 10 [place] internal/external biliary drain could not be fully advanced into the duodenum due to buckling of guidewire and catheter outside of the liver which prevented the drain to cross the distal CBD stricture. Ultimately, a standard 10 [place] multipurpose drain was advanced with sideholes predominantly in right-sided intrahepatic ducts and the distal portion of the catheter extending just to the origin of the common bile duct. This drain is draining bile and will be left to gravity drainage. As this drain will not be adequate for long-term drainage, the patient will be brought back for either re-attempted conversion to an internal/external biliary drain or new biliary drain placement via new percutaneous  biliary access under moderate IV conscious sedation. IMPRESSION: 1. Previously placed internal/external biliary drainage catheter had retracted nearly out of the liver and bile ducts. 2. Tract back into right intrahepatic bile ducts and across the CBD was not able to be recanalized by a 5 French catheter to the level of the duodenum. 3. A 10 French internal/external biliary drainage catheter could not be advanced into the duodenum due to buckling of guidewire and catheter outside of the liver, per venting the drain to cross the CBD. 4. A standard 10 [place] multipurpose drain was advanced into right-sided intrahepatic ducts and just to the level of the origin of the common bile duct. This will be left to external gravity drainage. 5. The patient will be brought back for re-attempted conversion versus new percutaneous biliary drainage catheter placement under moderate IV conscious sedation. Electronically Signed   By: Marcey [name] HERO.D.  On: 06/22/2024 15:54   IR Radiologist Eval & Mgmt Result Date: 06/16/2024 EXAM: Please see Epic Note CHIEF COMPLAINT: Please see Epic Note HISTORY OF PRESENT ILLNESS: Please see Epic Note REVIEW OF SYSTEMS: Please see Epic Note PHYSICAL EXAMINATION: Please see Epic Note ASSESSMENT AND PLAN: Please see Epic Note Electronically Signed   By: Cordella Banner   On: 06/16/2024 08:12   IR INT EXT BILIARY DRAIN WITH CHOLANGIOGRAM Result Date: 06/09/2024 INDICATION: 68 year old female with obstructed jaundice. CT imaging demonstrates a large right renal mass with possible direct invasion of the region of the distal common bile duct. History of prior gastric bypass with Roux-en-Y anatomy. Patient is therefore not a candidate for ERCP. EXAM: Percutaneous transhepatic cholangiogram with percutaneous biliary drain placement MEDICATIONS: In patient who has received Rocephin  and Flagyl  today. No additional antibiotic prophylaxis was administered. ANESTHESIA/SEDATION: Moderate (conscious)  sedation was employed during this procedure. A total of Versed  2.5 mg and Fentanyl  100 mcg was administered intravenously by the radiology nurse. Total intra-service moderate Sedation Time: 56 minutes. The patient's level of consciousness and vital signs were monitored continuously by radiology nursing throughout the procedure under my direct supervision. FLUOROSCOPY: Radiation Exposure Index (as provided by the fluoroscopic device): 774 mGy Kerma COMPLICATIONS: None immediate. PROCEDURE: Informed written consent was obtained from the patient after a thorough discussion of the procedural risks, benefits and alternatives. All questions were addressed. Maximal Sterile Barrier Technique was utilized including caps, mask, sterile gowns, sterile gloves, sterile drape, hand hygiene and skin antiseptic. A timeout was performed prior to the initiation of the procedure. The liver was interrogated with ultrasound. Biliary ductal dilatation is present. A suitable skin entry site was selected and marked. A small dermatotomy was made. Under real-time ultrasound guidance, a 22 gauge Chiba needle was advanced into a peripheral duct in hepatic segment 3. Transhepatic cholangiography was performed. Diffuse biliary ductal dilatation. Unfortunately, a wire could not be successfully passed into the biliary tree from this approach despite multiple attempts. Therefore, attention was turned to a right intracostal approach. Using standard technique, a 22 gauge 15 cm Chiba needle was advanced over a rib, through the periphery of the liver and used to puncture a tertiary radical within the right ductal system. Contrast injection confirms the needle is within the biliary tree. A wire was passed into the central common bile duct. The needle was removed. The Accustick transitional sheath was advanced over the wire and into the common bile duct. A hydrophilic road runner wire was then successfully navigated through the malignant obstruction and  into the duodenum. A 5 French catheter was advanced over the wire and into the duodenum. The hydrophilic wire was exchanged for an Amplatz wire. A Cook 10 French internal/external then advanced over the wire and formed. The locking loop is within the duodenum. The proximal side holes are within the obstructed intrahepatic bile ducts. Images were obtained and stored for the medical record. The tube was connected to bag drainage and secured to the skin with 0 Prolene suture. IMPRESSION: 1. 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 2. Successful placement of a 10 French internal/external biliary drainage catheter. PLAN: Maintain drain to bag drainage in till bilirubin has begun trending down. Bag should then be capped to limit fluid and electrolyte losses. Return to interventional Radiology in 6 weeks for tube check and change. Covered self expanding biliary stent placement could  be considered in the future. Electronically Signed   By: Wilkie Lent M.D.   On: 06/09/2024 16:28   CT ABDOMEN PELVIS W CONTRAST Result Date: 06/08/2024 CLINICAL DATA:  Acute abdominal pain EXAM: CT ABDOMEN AND PELVIS WITH CONTRAST TECHNIQUE: Multidetector CT imaging of the abdomen and pelvis was performed using the standard protocol following bolus administration of intravenous contrast. RADIATION DOSE REDUCTION: This exam was performed according to the departmental dose-optimization program which includes automated exposure control, adjustment of the mA and/or kV according to patient size and/or use of iterative reconstruction technique. CONTRAST:  OMNIPAQUE  IOHEXOL  350 MG/ML SOLN COMPARISON:  CT abdomen and pelvis 12/25/2023 FINDINGS: Lower chest: No acute abnormality. Hepatobiliary: Gallbladder surgically absent. There is moderate intrahepatic biliary ductal  dilatation which has increased from prior. Common bile duct is also moderately dilated, increased from prior. There are scattered rounded hypodensities in the liver which are too small to characterize and unchanged. No new focal liver lesions are identified. Pancreas: Soft tissue mass in the region of the pancreatic head and uncinate process is increased in size now measuring 3.6 x 5.8 cm. Pancreatic body and tail are within normal limits. There is mild inflammation surrounding the pancreatic head. Spleen: Normal in size without focal abnormality. Adrenals/Urinary Tract: Right renal mass measures 11.2 by 9.2 by 9.7 cm and has increased in size. This abuts the adjacent duodenum with loss of fat plane, progressed from prior. There is dilatation of the right superior pole calyx likely secondary to compression from renal mass. There is no left-sided hydronephrosis. Bilateral renal cysts are stable. There is no perinephric fat stranding. The adrenal glands and bladder are within normal limits. Stomach/Bowel: Stomach is within normal limits. Appendix appears normal. No evidence of bowel wall thickening, distention, or inflammatory changes. There is sigmoid colon diverticulosis. There are postsurgical changes in the stomach. There is loss of fat plane between the pancreatic mass in the gastric antrum and duodenum. Vascular/Lymphatic: Aorta and IVC are normal in size. No enlarged lymph nodes are identified. Reproductive: Status post hysterectomy. No adnexal masses. Other: No abdominal wall hernia or abnormality. No abdominopelvic ascites. Musculoskeletal: No acute or significant osseous findings. IMPRESSION: 1. Interval increase in size of right renal mass worrisome for renal cell carcinoma. 2. Interval increase in size of pancreatic head mass worrisome for metastatic disease or primary pancreatic neoplasm. 3. Interval increase in intra and extrahepatic biliary ductal dilatation likely secondary to obstruction at the level  of the pancreatic head mass. 4. Loss of fat plane between the pancreatic mass and gastric antrum and duodenum worrisome for invasion. 5. Mild inflammation surrounding the pancreatic head worrisome for pancreatitis. 6. Diverticulosis. Electronically Signed   By: Greig Pique M.D.   On: 06/08/2024 22:46    Microbiology: Results for orders placed or performed during the hospital encounter of 06/29/24  Culture, blood (x 2)     Status: None (Preliminary result)   Collection Time: 06/29/24  4:39 PM   Specimen: BLOOD  Result Value Ref Range Status   Specimen Description BLOOD LEFT ANTECUBITAL  Final   Special Requests   Final    BOTTLES DRAWN AEROBIC AND ANAEROBIC Blood Culture adequate volume   Culture   Final    NO GROWTH 2 DAYS Performed at [facility], [address] [place], [place] [address]    Report Status PENDING  Incomplete  Culture, blood (Routine X 2) w Reflex to ID Panel     Status: None (Preliminary result)   Collection Time:  06/29/24  7:02 PM   Specimen: BLOOD  Result Value Ref Range Status   Specimen Description BLOOD BLOOD LEFT HAND  Final   Special Requests   Final    BOTTLES DRAWN AEROBIC ONLY Blood Culture results may not be optimal due to an inadequate volume of blood received in culture bottles   Culture   Final    NO GROWTH 2 DAYS Performed at [facility], [address] [place], [place] [address]    Report Status PENDING  Incomplete    Labs: CBC: Recent Labs  Lab 06/29/24 1141 06/29/24 1639 06/30/24 0337  WBC 16.0* 16.7* 12.4*  NEUTROABS  --  14.3*  --   HGB 12.3 11.7* 10.0*  HCT 37.0 35.6* 30.3*  MCV 84.5 83.6 83.0  PLT 421* 373 291   Basic Metabolic Panel: Recent Labs  Lab 06/29/24 1141 06/29/24 1639 06/30/24 0337 07/01/24 0409  NA 140 138 138 140  K 3.7 3.8 3.9 4.0  CL 101 101 105 108  CO2 23 22 22 23   GLUCOSE 147* 145* 116* 129*  BUN 27* 28* 26* 25*  CREATININE 1.68* 1.62* 1.29* 1.33*  CALCIUM  9.8 9.7 8.7* 8.8*   MG  --   --   --  2.1  PHOS  --   --   --  3.2   Liver Function Tests: Recent Labs  Lab 06/29/24 1141 06/29/24 1639 06/30/24 0337  AST 30 29 34  ALT 16 16 16   ALKPHOS 174* 170* 142*  BILITOT 1.9* 1.7* 1.2  PROT 8.1 7.8 6.3*  ALBUMIN 3.0* 3.1* 2.4*   Discharge time spent: greater than 30 minutes.  Signed: Leita Blanch, MD [facility] 07/01/2024

## 2024-07-01 NOTE — Progress Notes (Signed)
  IR BRIEF PROGRESS NOTE:  Ms Wooden is feeling improved and slated for discharge. She is tolerating her drain well, with expected discomfort. I have reviewed in detail discharge recommendations and confirmed patient is educated on outpatient drain care. She will proceed with daily flushing of her drain with 5-10 mL of normal saline to prevent clogging, as well as dressing changes every 2-3 days or anytime the dressing is wet or soiled. Patient will follow up as outpatient in clinic in 2 weeks for requested close follow up and possible consideration for capping trial, and again in 4 weeks for routine drain exchanged. Patient has acknowledged these instructions and all questions were answered to patient's satisfaction. Orders in place.    Electronically Signed: Carlin DELENA Griffon, PA-C 07/01/2024, 9:13 AM

## 2024-07-01 NOTE — Progress Notes (Signed)
 Alyssa Leonard to be D/C'd Home per MD order.  Discussed with the patient and all questions fully answered.  VSS, Skin clean, dry and intact without evidence of skin break down, no evidence of skin tears noted. IV catheter discontinued intact. Site without signs and symptoms of complications. Dressing and pressure applied.  An After Visit Summary was printed and given to the patient. Patient received prescriptions from pharmacy to bedside and instructions reviewed.  D/c education completed with patient/family including follow up instructions, medication list, d/c activities limitations if indicated, with other d/c instructions as indicated by MD - patient able to verbalize understanding, all questions fully answered.   Patient instructed to return to ED, call 911, or call MD for any changes in condition.   Patient escorted via WC, and D/C home via private auto.  Alyssa Leonard 07/01/2024 11:55 AM

## 2024-07-04 LAB — CULTURE, BLOOD (ROUTINE X 2)
Culture: NO GROWTH
Culture: NO GROWTH
Special Requests: ADEQUATE

## 2024-07-08 NOTE — Telephone Encounter (Addendum)
  The patient reports they are physically located in [place]  and is currently: at home. I conducted a phone visit.  I spent 14 minutes on the phone call with the patient on the date of service .   Check in on patient for abdominal prior pain; Pain is more improved and managed with hydromorphone . She was admitted [facility] for sepsis on 06/29/2024 She has  leukocytosis at 16, AKI at 1.68, hypertension, tachycardia. Pain control, IV fluid hydration and antibiotic. IR Biliary drain convert to int/ext on Tues 06/29/24,  Discharge 9/4. She wants to transition her care to [facility].  Addend 07/08/2024  420 pm : called and Update Ms Baquera that the following should get done ASAP.   1) Get her EUS to try to get better tissue. Right now she does not have Dx of pancreatic cancer. And need to rule out pancreatic neuroendocrine tumor.  2) Get chromogranin A from blood draw. This should also help rule out neuroendocrine tumor.  3) Get her in to see Dr. Ashwin Somasundaram  4) In the interim we will be doing work-up for Pancreatic lesions / IPMN and send referral to [place] for transition her care there.

## 2024-07-13 ENCOUNTER — Other Ambulatory Visit: Payer: Self-pay

## 2024-07-13 ENCOUNTER — Ambulatory Visit: Payer: Self-pay | Admitting: Oncology

## 2024-07-13 ENCOUNTER — Other Ambulatory Visit: Payer: Self-pay | Admitting: Urology

## 2024-07-13 ENCOUNTER — Encounter: Payer: Self-pay | Admitting: Internal Medicine

## 2024-07-13 ENCOUNTER — Encounter: Payer: Self-pay | Admitting: Oncology

## 2024-07-13 ENCOUNTER — Inpatient Hospital Stay: Attending: Oncology | Admitting: Oncology

## 2024-07-13 ENCOUNTER — Inpatient Hospital Stay
Admission: EM | Admit: 2024-07-13 | Discharge: 2024-08-03 | DRG: 673 | Disposition: A | Discharge status: Home Health | Source: Ambulatory Visit | Attending: Family Medicine | Admitting: Family Medicine

## 2024-07-13 ENCOUNTER — Other Ambulatory Visit: Payer: Self-pay | Admitting: Internal Medicine

## 2024-07-13 ENCOUNTER — Telehealth: Payer: Self-pay

## 2024-07-13 ENCOUNTER — Inpatient Hospital Stay (HOSPITAL_BASED_OUTPATIENT_CLINIC_OR_DEPARTMENT_OTHER): Admitting: Hospice and Palliative Medicine

## 2024-07-13 ENCOUNTER — Inpatient Hospital Stay

## 2024-07-13 VITALS — BP 107/77 | HR 119 | Temp 97.8°F | Resp 20 | Wt 225.1 lb

## 2024-07-13 VITALS — BP 110/82 | HR 100 | Resp 16

## 2024-07-13 DIAGNOSIS — K219 Gastro-esophageal reflux disease without esophagitis: Secondary | ICD-10-CM | POA: Diagnosis present

## 2024-07-13 DIAGNOSIS — G4733 Obstructive sleep apnea (adult) (pediatric): Secondary | ICD-10-CM | POA: Diagnosis present

## 2024-07-13 DIAGNOSIS — I10 Essential (primary) hypertension: Secondary | ICD-10-CM | POA: Diagnosis present

## 2024-07-13 DIAGNOSIS — I480 Paroxysmal atrial fibrillation: Secondary | ICD-10-CM | POA: Diagnosis present

## 2024-07-13 DIAGNOSIS — Z85528 Personal history of other malignant neoplasm of kidney: Secondary | ICD-10-CM

## 2024-07-13 DIAGNOSIS — C641 Malignant neoplasm of right kidney, except renal pelvis: Secondary | ICD-10-CM

## 2024-07-13 DIAGNOSIS — K831 Obstruction of bile duct: Secondary | ICD-10-CM | POA: Diagnosis present

## 2024-07-13 DIAGNOSIS — Z7901 Long term (current) use of anticoagulants: Secondary | ICD-10-CM

## 2024-07-13 DIAGNOSIS — C801 Malignant (primary) neoplasm, unspecified: Secondary | ICD-10-CM

## 2024-07-13 DIAGNOSIS — I4892 Unspecified atrial flutter: Secondary | ICD-10-CM | POA: Diagnosis present

## 2024-07-13 DIAGNOSIS — N1832 Chronic kidney disease, stage 3b: Secondary | ICD-10-CM | POA: Diagnosis present

## 2024-07-13 DIAGNOSIS — E1122 Type 2 diabetes mellitus with diabetic chronic kidney disease: Secondary | ICD-10-CM | POA: Diagnosis present

## 2024-07-13 DIAGNOSIS — K8689 Other specified diseases of pancreas: Secondary | ICD-10-CM

## 2024-07-13 DIAGNOSIS — Z66 Do not resuscitate: Secondary | ICD-10-CM | POA: Diagnosis present

## 2024-07-13 DIAGNOSIS — K869 Disease of pancreas, unspecified: Secondary | ICD-10-CM | POA: Insufficient documentation

## 2024-07-13 DIAGNOSIS — Z87891 Personal history of nicotine dependence: Secondary | ICD-10-CM

## 2024-07-13 DIAGNOSIS — E119 Type 2 diabetes mellitus without complications: Secondary | ICD-10-CM

## 2024-07-13 DIAGNOSIS — C7989 Secondary malignant neoplasm of other specified sites: Secondary | ICD-10-CM | POA: Diagnosis present

## 2024-07-13 DIAGNOSIS — R630 Anorexia: Secondary | ICD-10-CM

## 2024-07-13 DIAGNOSIS — K7460 Unspecified cirrhosis of liver: Secondary | ICD-10-CM | POA: Diagnosis present

## 2024-07-13 DIAGNOSIS — I129 Hypertensive chronic kidney disease with stage 1 through stage 4 chronic kidney disease, or unspecified chronic kidney disease: Secondary | ICD-10-CM | POA: Diagnosis present

## 2024-07-13 DIAGNOSIS — J4489 Other specified chronic obstructive pulmonary disease: Secondary | ICD-10-CM | POA: Diagnosis present

## 2024-07-13 DIAGNOSIS — Z886 Allergy status to analgesic agent status: Secondary | ICD-10-CM

## 2024-07-13 DIAGNOSIS — Z515 Encounter for palliative care: Secondary | ICD-10-CM

## 2024-07-13 DIAGNOSIS — Z8249 Family history of ischemic heart disease and other diseases of the circulatory system: Secondary | ICD-10-CM

## 2024-07-13 DIAGNOSIS — Z90710 Acquired absence of both cervix and uterus: Secondary | ICD-10-CM

## 2024-07-13 DIAGNOSIS — G893 Neoplasm related pain (acute) (chronic): Secondary | ICD-10-CM | POA: Insufficient documentation

## 2024-07-13 DIAGNOSIS — E870 Hyperosmolality and hypernatremia: Secondary | ICD-10-CM | POA: Diagnosis present

## 2024-07-13 DIAGNOSIS — Z9884 Bariatric surgery status: Secondary | ICD-10-CM

## 2024-07-13 DIAGNOSIS — K660 Peritoneal adhesions (postprocedural) (postinfection): Secondary | ICD-10-CM | POA: Diagnosis present

## 2024-07-13 DIAGNOSIS — Z8419 Family history of other disorders of kidney and ureter: Secondary | ICD-10-CM

## 2024-07-13 DIAGNOSIS — D631 Anemia in chronic kidney disease: Secondary | ICD-10-CM | POA: Diagnosis present

## 2024-07-13 DIAGNOSIS — E8721 Acute metabolic acidosis: Secondary | ICD-10-CM | POA: Diagnosis present

## 2024-07-13 DIAGNOSIS — Z9221 Personal history of antineoplastic chemotherapy: Secondary | ICD-10-CM

## 2024-07-13 DIAGNOSIS — R54 Age-related physical debility: Secondary | ICD-10-CM | POA: Diagnosis present

## 2024-07-13 DIAGNOSIS — Z6841 Body Mass Index (BMI) 40.0 and over, adult: Secondary | ICD-10-CM

## 2024-07-13 DIAGNOSIS — E860 Dehydration: Secondary | ICD-10-CM | POA: Diagnosis present

## 2024-07-13 DIAGNOSIS — Z79899 Other long term (current) drug therapy: Secondary | ICD-10-CM

## 2024-07-13 DIAGNOSIS — Z91040 Latex allergy status: Secondary | ICD-10-CM

## 2024-07-13 DIAGNOSIS — N179 Acute kidney failure, unspecified: Principal | ICD-10-CM | POA: Diagnosis present

## 2024-07-13 DIAGNOSIS — K5900 Constipation, unspecified: Secondary | ICD-10-CM | POA: Diagnosis present

## 2024-07-13 DIAGNOSIS — R627 Adult failure to thrive: Secondary | ICD-10-CM | POA: Diagnosis present

## 2024-07-13 DIAGNOSIS — C259 Malignant neoplasm of pancreas, unspecified: Secondary | ICD-10-CM | POA: Diagnosis present

## 2024-07-13 DIAGNOSIS — J449 Chronic obstructive pulmonary disease, unspecified: Secondary | ICD-10-CM | POA: Diagnosis present

## 2024-07-13 DIAGNOSIS — D72829 Elevated white blood cell count, unspecified: Secondary | ICD-10-CM | POA: Diagnosis not present

## 2024-07-13 DIAGNOSIS — E66813 Obesity, class 3: Secondary | ICD-10-CM | POA: Diagnosis present

## 2024-07-13 DIAGNOSIS — D62 Acute posthemorrhagic anemia: Secondary | ICD-10-CM | POA: Diagnosis not present

## 2024-07-13 LAB — CBC WITH DIFFERENTIAL/PLATELET
Abs Immature Granulocytes: 0.04 K/uL (ref 0.00–0.07)
Abs Immature Granulocytes: 0.04 K/uL (ref 0.00–0.07)
Basophils Absolute: 0 K/uL (ref 0.0–0.1)
Basophils Absolute: 0 K/uL (ref 0.0–0.1)
Basophils Relative: 1 %
Basophils Relative: 1 %
Eosinophils Absolute: 0.2 K/uL (ref 0.0–0.5)
Eosinophils Absolute: 0.2 K/uL (ref 0.0–0.5)
Eosinophils Relative: 2 %
Eosinophils Relative: 2 %
HCT: 38.4 % (ref 36.0–46.0)
HCT: 36.6 % (ref 36.0–46.0)
Hemoglobin: 12.4 g/dL (ref 12.0–15.0)
Hemoglobin: 11.8 g/dL — ABNORMAL LOW (ref 12.0–15.0)
Immature Granulocytes: 1 %
Immature Granulocytes: 1 %
Lymphocytes Relative: 18 %
Lymphocytes Relative: 20 %
Lymphs Abs: 1.5 K/uL (ref 0.7–4.0)
Lymphs Abs: 1.7 K/uL (ref 0.7–4.0)
MCH: 27.7 pg (ref 26.0–34.0)
MCH: 27.6 pg (ref 26.0–34.0)
MCHC: 32.3 g/dL (ref 30.0–36.0)
MCHC: 32.2 g/dL (ref 30.0–36.0)
MCV: 85.9 fL (ref 80.0–100.0)
MCV: 85.7 fL (ref 80.0–100.0)
Monocytes Absolute: 0.7 K/uL (ref 0.1–1.0)
Monocytes Absolute: 1 K/uL (ref 0.1–1.0)
Monocytes Relative: 8 %
Monocytes Relative: 11 %
Neutro Abs: 5.8 K/uL (ref 1.7–7.7)
Neutro Abs: 5.5 K/uL (ref 1.7–7.7)
Neutrophils Relative %: 70 %
Neutrophils Relative %: 65 %
Platelets: 257 K/uL (ref 150–400)
Platelets: 231 K/uL (ref 150–400)
RBC: 4.47 MIL/uL (ref 3.87–5.11)
RBC: 4.27 MIL/uL (ref 3.87–5.11)
RDW: 16.7 % — ABNORMAL HIGH (ref 11.5–15.5)
RDW: 16.8 % — ABNORMAL HIGH (ref 11.5–15.5)
WBC: 8.2 K/uL (ref 4.0–10.5)
WBC: 8.4 K/uL (ref 4.0–10.5)
nRBC: 0 % (ref 0.0–0.2)
nRBC: 0 % (ref 0.0–0.2)

## 2024-07-13 LAB — COMPREHENSIVE METABOLIC PANEL WITH GFR
ALT: 43 U/L (ref 0–44)
ALT: 45 U/L — ABNORMAL HIGH (ref 0–44)
AST: 59 U/L — ABNORMAL HIGH (ref 15–41)
AST: 64 U/L — ABNORMAL HIGH (ref 15–41)
Albumin: 3.7 g/dL (ref 3.5–5.0)
Albumin: 3.3 g/dL — ABNORMAL LOW (ref 3.5–5.0)
Alkaline Phosphatase: 164 U/L — ABNORMAL HIGH (ref 38–126)
Alkaline Phosphatase: 149 U/L — ABNORMAL HIGH (ref 38–126)
Anion gap: 13 (ref 5–15)
Anion gap: 13 (ref 5–15)
BUN: 41 mg/dL — ABNORMAL HIGH (ref 8–23)
BUN: 43 mg/dL — ABNORMAL HIGH (ref 8–23)
CO2: 20 mmol/L — ABNORMAL LOW (ref 22–32)
CO2: 21 mmol/L — ABNORMAL LOW (ref 22–32)
Calcium: 9.9 mg/dL (ref 8.9–10.3)
Calcium: 9.7 mg/dL (ref 8.9–10.3)
Chloride: 102 mmol/L (ref 98–111)
Chloride: 103 mmol/L (ref 98–111)
Creatinine, Ser: 3.06 mg/dL — ABNORMAL HIGH (ref 0.44–1.00)
Creatinine, Ser: 3.05 mg/dL — ABNORMAL HIGH (ref 0.44–1.00)
GFR, Estimated: 16 mL/min — ABNORMAL LOW (ref >60)
GFR, Estimated: 16 mL/min — ABNORMAL LOW (ref >60)
Glucose, Bld: 240 mg/dL — ABNORMAL HIGH (ref 70–99)
Glucose, Bld: 139 mg/dL — ABNORMAL HIGH (ref 70–99)
Potassium: 4.9 mmol/L (ref 3.5–5.1)
Potassium: 4.7 mmol/L (ref 3.5–5.1)
Sodium: 135 mmol/L (ref 135–145)
Sodium: 137 mmol/L (ref 135–145)
Total Bilirubin: 1.8 mg/dL — ABNORMAL HIGH (ref 0.0–1.2)
Total Bilirubin: 1.6 mg/dL — ABNORMAL HIGH (ref 0.0–1.2)
Total Protein: 8 g/dL (ref 6.5–8.1)
Total Protein: 7.7 g/dL (ref 6.5–8.1)

## 2024-07-13 LAB — TROPONIN I (HIGH SENSITIVITY): Troponin I (High Sensitivity): 11 ng/L (ref <18)

## 2024-07-13 MED ORDER — ACETAMINOPHEN 325 MG PO TABS: 650.0000 mg | ORAL_TABLET | Freq: Four times a day (QID) | ORAL | Status: AC | PRN

## 2024-07-13 MED ORDER — ALBUTEROL SULFATE (2.5 MG/3ML) 0.083% IN NEBU: 2.5000 mg | INHALATION_SOLUTION | Freq: Four times a day (QID) | RESPIRATORY_TRACT | Status: DC | PRN

## 2024-07-13 MED ORDER — OXYCODONE HCL 5 MG PO TABS
5.0000 mg | ORAL_TABLET | Freq: Three times a day (TID) | ORAL | Status: DC | PRN
  Administered 2024-07-14 – 2024-07-26 (×15): 5 mg via ORAL
  Filled 2024-07-13 (×17): qty 1

## 2024-07-13 MED ORDER — SODIUM CHLORIDE 0.9 % IV SOLN
Freq: Once | INTRAVENOUS | Status: DC
  Filled 2024-07-13: qty 250

## 2024-07-13 MED ORDER — AMIODARONE HCL 200 MG PO TABS
200.0000 mg | ORAL_TABLET | Freq: Every day | ORAL | Status: DC
Start: 2024-07-14 — End: 2024-07-28
  Administered 2024-07-14 – 2024-07-28 (×14): 200 mg via ORAL
  Filled 2024-07-13 (×14): qty 1

## 2024-07-13 MED ORDER — MECLIZINE HCL 25 MG PO TABS
25.0000 mg | ORAL_TABLET | Freq: Three times a day (TID) | ORAL | Status: DC | PRN
Start: 2024-07-13 — End: 2024-07-27
  Administered 2024-07-22 – 2024-07-23 (×2): 25 mg via ORAL
  Filled 2024-07-13 (×3): qty 1

## 2024-07-13 MED ORDER — ALBUTEROL SULFATE HFA 108 (90 BASE) MCG/ACT IN AERS: 2.0000 | INHALATION_SPRAY | Freq: Four times a day (QID) | RESPIRATORY_TRACT | Status: DC | PRN

## 2024-07-13 MED ORDER — DOCUSATE SODIUM 100 MG PO CAPS
100.0000 mg | ORAL_CAPSULE | Freq: Two times a day (BID) | ORAL | Status: DC
  Administered 2024-07-14 – 2024-07-23 (×18): 100 mg via ORAL
  Filled 2024-07-13 (×19): qty 1

## 2024-07-13 MED ORDER — MIRTAZAPINE 15 MG PO TABS
7.5000 mg | ORAL_TABLET | Freq: Every day | ORAL | Status: DC
Start: 2024-07-13 — End: 2024-07-19
  Administered 2024-07-14 – 2024-07-18 (×6): 7.5 mg via ORAL
  Filled 2024-07-13 (×6): qty 1

## 2024-07-13 MED ORDER — MIRTAZAPINE 7.5 MG PO TABS
7.5000 mg | ORAL_TABLET | Freq: Every day | ORAL | 0 refills | Status: DC
  Filled 2024-07-13: qty 30, 30d supply, fill #0

## 2024-07-13 MED ORDER — ONDANSETRON HCL 4 MG PO TABS: 4.0000 mg | ORAL_TABLET | Freq: Four times a day (QID) | ORAL | Status: AC | PRN

## 2024-07-13 MED ORDER — FAMOTIDINE 20 MG PO TABS
40.0000 mg | ORAL_TABLET | Freq: Every day | ORAL | Status: DC
Start: 2024-07-13 — End: 2024-07-14
  Administered 2024-07-14: 40 mg via ORAL
  Filled 2024-07-13: qty 2

## 2024-07-13 MED ORDER — DIAZEPAM 2 MG PO TABS
2.0000 mg | ORAL_TABLET | Freq: Two times a day (BID) | ORAL | Status: DC | PRN
Start: 2024-07-13 — End: 2024-07-27
  Administered 2024-07-24: 2 mg via ORAL
  Filled 2024-07-13: qty 1

## 2024-07-13 MED ORDER — SENNOSIDES-DOCUSATE SODIUM 8.6-50 MG PO TABS: 1.0000 | ORAL_TABLET | Freq: Every evening | ORAL | Status: DC | PRN

## 2024-07-13 MED ORDER — SODIUM CHLORIDE 0.9 % IV BOLUS
1000.0000 mL | Freq: Once | INTRAVENOUS | Status: AC
  Administered 2024-07-13: 1000 mL via INTRAVENOUS

## 2024-07-13 MED ORDER — OXYCODONE HCL 5 MG PO TABS
5.0000 mg | ORAL_TABLET | Freq: Three times a day (TID) | ORAL | 0 refills | Status: DC | PRN
  Filled 2024-07-13: qty 30, 10d supply, fill #0

## 2024-07-13 MED ORDER — INSULIN ASPART 100 UNIT/ML IJ SOLN: 0.0000 [IU] | Freq: Every day | INTRAMUSCULAR | Status: DC

## 2024-07-13 MED ORDER — LACTATED RINGERS IV SOLN: INTRAVENOUS | Status: AC

## 2024-07-13 MED ORDER — POLYETHYLENE GLYCOL 3350 17 G PO PACK: 17.0000 g | PACK | Freq: Two times a day (BID) | ORAL | Status: AC | PRN

## 2024-07-13 MED ORDER — SODIUM CHLORIDE 0.9 % IV SOLN
INTRAVENOUS | Status: DC
  Filled 2024-07-13 (×2): qty 250

## 2024-07-13 MED ORDER — METOPROLOL TARTRATE 25 MG PO TABS
25.0000 mg | ORAL_TABLET | Freq: Two times a day (BID) | ORAL | Status: DC
  Administered 2024-07-14 – 2024-07-27 (×27): 25 mg via ORAL
  Filled 2024-07-13 (×27): qty 1

## 2024-07-13 MED ORDER — INSULIN ASPART 100 UNIT/ML IJ SOLN
0.0000 [IU] | Freq: Three times a day (TID) | INTRAMUSCULAR | Status: DC
  Administered 2024-07-17 – 2024-07-21 (×4): 2 [IU] via SUBCUTANEOUS
  Filled 2024-07-13 (×5): qty 1

## 2024-07-13 MED ORDER — APIXABAN 5 MG PO TABS
5.0000 mg | ORAL_TABLET | Freq: Two times a day (BID) | ORAL | Status: DC
Start: 2024-07-13 — End: 2024-07-20
  Administered 2024-07-14 – 2024-07-20 (×13): 5 mg via ORAL
  Filled 2024-07-13 (×13): qty 1

## 2024-07-13 MED ORDER — ONDANSETRON HCL 4 MG/2ML IJ SOLN: 4.0000 mg | Freq: Four times a day (QID) | INTRAMUSCULAR | Status: AC | PRN

## 2024-07-13 MED ORDER — ACETAMINOPHEN 650 MG RE SUPP: 650.0000 mg | Freq: Four times a day (QID) | RECTAL | Status: AC | PRN

## 2024-07-13 MED ORDER — ENSURE PLUS HIGH PROTEIN PO LIQD
237.0000 mL | Freq: Three times a day (TID) | ORAL | Status: DC
  Administered 2024-07-15: 237 mL via ORAL

## 2024-07-13 MED ORDER — BISACODYL 5 MG PO TBEC: 5.0000 mg | DELAYED_RELEASE_TABLET | Freq: Every day | ORAL | Status: DC | PRN

## 2024-07-13 NOTE — ED Provider Notes (Signed)
 Alyssa Leonard Provider Note    Event Date/Time   First MD Initiated Contact with Patient 07/13/24 2006     (approximate)   History   abnormal labs   HPI  Alyssa Leonard is a 68 y.o. 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  Patient was seen by oncology and got routine labs done, was found to have an AKI.  Was sent in for further management and IV fluids.  Patient states that she has been feeling weak and has no appetite therefore she has not been eating.  States that she has abdominal pain in the epigastric and right upper quadrant but that has been ongoing and does not report any new pain or worsening pain.  Biliary drain is still in place and is draining, has been is carrying for the drain and she does not report any tenderness around the drain site or any redness or swelling.  No fevers, no vomiting or diarrhea, she denies any urinary symptoms.  No chest pain or shortness of breath, no cough.   On independent chart review, she was seen by hospice and palliative care today.  Per note, she has remained frail and weak with poor p.o. intake.  She was recently admitted for sepsis and obstructive jaundice due to due to pancreatic mass status post biliary drain.  Physical Exam   Triage Vital Signs: ED Triage Vitals  Encounter Vitals Group     BP 07/13/24 1613 (!) 121/54     Girls Systolic BP Percentile --      Girls Diastolic BP Percentile --      Boys Systolic BP Percentile --      Boys Diastolic BP Percentile --      Pulse Rate 07/13/24 1613 97     Resp 07/13/24 1613 16     Temp 07/13/24 1613 (!) 97.5 F (36.4 C)     Temp Source 07/13/24 1613 Oral     SpO2 07/13/24 1613 100 %     Weight 07/13/24 1612 224 lb 13.9 oz (102 kg)     Height --       Head Circumference --      Peak Flow --      Pain Score 07/13/24 1611 6     Pain Loc --      Pain Education --      Exclude from Growth Chart --     Most recent vital signs: Vitals:   07/13/24 2043 07/13/24 2100  BP: 120/67 (!) 141/53  Pulse: 91 91  Resp: 18   Temp: 98 F (36.7 C)   SpO2: 100%      General: Awake, no distress.  CV:  Good peripheral perfusion.  Resp:  Normal effort.  Abd:  No distention.  Soft, nontender, no guarding, biliary drain in the right upper quadrant without any surrounding erythema, tenderness, induration or fluctuance.  It is draining dark green fluid. Other:  Dry oral mucosa   ED Results / Procedures / Treatments   Labs (all labs ordered are listed, but only abnormal results are displayed) Labs Reviewed  COMPREHENSIVE METABOLIC PANEL WITH GFR - Abnormal; Notable for the following components:      Result Value   CO2 21 (*)    Glucose, Bld 139 (*)    BUN 43 (*)  Creatinine, Ser 3.05 (*)    Albumin 3.3 (*)    AST 64 (*)    ALT 45 (*)    Alkaline Phosphatase 149 (*)    Total Bilirubin 1.6 (*)    GFR, Estimated 16 (*)    All other components within normal limits  CBC WITH DIFFERENTIAL/PLATELET - Abnormal; Notable for the following components:   Hemoglobin 11.8 (*)    RDW 16.8 (*)    All other components within normal limits  TROPONIN I (HIGH SENSITIVITY)     EKG  EKG shows, EKG shows sinus rhythm heart rate 78, normal QS, normal QTc, no obvious ischemic ST elevation, prior T wave inversions in inferior leads are not present compared to prior    PROCEDURES:  Critical Care performed: No  Procedures   MEDICATIONS ORDERED IN ED: Medications  sodium chloride  0.9 % bolus 1,000 mL (1,000 mLs Intravenous New Bag/Given 07/13/24 2040)     IMPRESSION / MDM / ASSESSMENT AND PLAN / ED COURSE  I reviewed the triage vital signs and the nursing notes.                              Differential diagnosis includes, but is not limited  to, dehydration, electrolyte derangements, AKI, failure to thrive.  She denies any new abdominal pain, drain is in place and functioning, doubt new obstruction at this time.  She denies any urinary symptoms or vomiting or diarrhea, doubt colitis or diverticulitis or UTI.  Did consider atypical ACS with the weakness but given her decreased p.o. intake and her AKI, favor dehydration.  Will repeat labs, get EKG and a troponin.  Will give her some IV fluids.  She will need to be admitted for further management.   Patient's presentation is most consistent with acute presentation with potential threat to life or bodily function.  Independent interpretation of labs below.  Given her AKI, dehydration, decreased p.o. intake and generalized weakness, she will need to be admitted for further management.  Consult to hospitalist was agreeable with the plan for admission and will evaluate patient.  She is admitted.    Clinical Course as of 07/13/24 2140  Tue Jul 13, 2024  2126 Dependent review of labs, electrolytes not severely deranged, she does have an AKI, LFTs are mildly elevated but not significantly changed compared to prior, no leukocytosis.  Troponin is not elevated. [TT]    Clinical Course User Index [TT] Waymond, Lorelle Cummins, MD     FINAL CLINICAL IMPRESSION(S) / ED DIAGNOSES   Final diagnoses:  AKI (acute kidney injury) (HCC)  Dehydration  Lack of appetite     Rx / DC Orders   ED Discharge Orders     None        Note:  This document was prepared using Dragon voice recognition software and may include unintentional dictation errors.    Waymond Lorelle Cummins, MD 07/13/24 [phone]

## 2024-07-13 NOTE — Assessment & Plan Note (Signed)
 CPAP nightly ordered

## 2024-07-13 NOTE — Assessment & Plan Note (Signed)
 PDMP reviewed Active prescription include oxycodone  5 mg tablet, 15 tablets, 8-day supply written and filled on 07/01/2024

## 2024-07-13 NOTE — Progress Notes (Signed)
 Pt transported to ED per Sidra Mower NP orders.  NP called triage with report.  Pt taken with 22G angiocath saline locked in right antecubital.  Husband to meet patient at ED.

## 2024-07-13 NOTE — Hospital Course (Addendum)
 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  Patient reports that she has had poor p.o. intake since cancer diagnosis.  She also endorses recurrent nausea.  She has recently met with oncology and palliative care and is planning for biopsy of the pancreatic mass.  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  Expanding metallic biliary stent was placed with improved biliary patency, external biliary stent was removed.  Stay is further complicated by ongoing AKI.  Ultimately patient elected for J-tube placement on 9/25.  Postoperative course has been complicated by large-volume vomiting.  Upper GI 9/29 showed contrast beyond jejunostomy but she continues to have trouble with p.o. and NG tube was placed which allowed for venting.  Gradually her vomiting resolved and NG tube was able to be removed on 10/3.  Patient is now tolerating J-tube feeding. She is having persistent leaking around J tube and prior surgical site. CT shows no evidence of abscess or fistula, most likely pt is third spacing.  Augmentin  also added for early signs of cellulitis surrounding J-tube site. Unfortunately patient prognosis remains poor.  We have offered SNF vs hospice vs home with [facility] and patient and her husband would like to try to discharge home with home health.  Bedside teaching was performed to ensure patient and husband comfortability with J-tube feeding at home.  Patient will continue with p.o. intake as desired for comfort.  Palliative care will follow the patient outpatient.

## 2024-07-13 NOTE — Telephone Encounter (Signed)
 Per secure chat message per josh - dr. melanee wants pt to come back and get iv fluids. josh attempted to call and leave a vm for patient to come back asap or she may have to go to the er.  creat. level is high 3.06.  Outbound call to patient; informed of above.  Patient states she can be back at clinic by 2pm.

## 2024-07-13 NOTE — Progress Notes (Addendum)
 Palliative Medicine [facility] at [facility] [phone] [phone]) [phone]   Name: Alyssa Leonard Date: 07/13/2024 MRN: [id number]  DOB: 1956/06/28  Patient Care Team: [name] LABOR, MD as PCP - General (Internal Medicine) Darron Deatrice LABOR, MD as PCP - Cardiology (Cardiology) Luke Elsie GRADE, MD as PCP - Hematology/Oncology (Oncology) Kennyth Chew, MD as PCP - Electrophysiology (Cardiology) Melanee Annah BROCKS, MD as Consulting Physician (Oncology)    REASON FOR CONSULTATION: Alyssa Leonard is a 68 y.o. female with multiple medical problems including morbid obesity, OSA on CPAP, a flutter who could not afford Xarelto  and amiodarone , diabetes, and RCC with intra-abdominal metastasis previously on treatment with immunotherapy.  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  She was receiving treatment at [facility] but transferred care.  Palliative care consulted to address goals of manage ongoing symptoms.  SOCIAL HISTORY:     reports that she quit smoking about 30 years ago. Her smoking use included cigarettes. She has never used smokeless tobacco. She reports that she does not drink alcohol and does not use drugs.  Patient married and lives at home with her husband.  She has 2 adult daughters who live nearby.  Patient previously worked in a [employer], technical [employer].  ADVANCE DIRECTIVES:    CODE STATUS:   PAST MEDICAL HISTORY: Past Medical History:  Diagnosis Date   Asthma    Atrial flutter (HCC)    COPD (chronic obstructive pulmonary disease) (HCC)    Diabetes mellitus without complication (HCC)    GERD (gastroesophageal reflux disease)    History of colon polyps    Hypertension    Meniere disease    OSA (obstructive sleep apnea)    Renal cell carcinoma (HCC)    Sleep apnea    Went for sleep study test in January 2018 I haven't heard back from test    PAST SURGICAL HISTORY:  Past  Surgical History:  Procedure Laterality Date   ABDOMINAL HYSTERECTOMY     BARIATRIC SURGERY     CHOLECYSTECTOMY     COLONOSCOPY WITH PROPOFOL  N/A 12/11/2016   Procedure: COLONOSCOPY WITH PROPOFOL ;  Surgeon: Lamar ONEIDA Holmes, MD;  Location: [facility] ENDOSCOPY;  Service: Endoscopy;  Laterality: N/A;   COLONOSCOPY WITH PROPOFOL  N/A 11/27/2021   Procedure: COLONOSCOPY WITH PROPOFOL ;  Surgeon: Maryruth Ole ONEIDA, MD;  Location: [facility] ENDOSCOPY;  Service: Endoscopy;  Laterality: N/A;   IR CONVERT BILIARY DRAIN TO INT EXT BILIARY DRAIN  06/29/2024   IR EXCHANGE BILIARY DRAIN  06/22/2024   IR INT EXT BILIARY DRAIN WITH CHOLANGIOGRAM  06/09/2024   IR RADIOLOGIST EVAL & MGMT  06/15/2024    HEMATOLOGY/ONCOLOGY HISTORY:  Oncology History   No history exists.    ALLERGIES:  is allergic to latex, aspirin, and plasticized base [plastibase].  MEDICATIONS:  Current Outpatient Medications  Medication Sig Dispense Refill   acetaminophen  (TYLENOL ) 500 MG tablet Take 1,000 mg by mouth every 6 (six) hours as needed for mild pain (pain score 1-3).     albuterol  (PROVENTIL  HFA;VENTOLIN  HFA) 108 (90 Base) MCG/ACT inhaler Inhale 2 puffs into the lungs every 6 (six) hours as needed for wheezing or shortness of breath.     amiodarone  (PACERONE ) 200 MG tablet Take 1 tablet (200 mg total) by mouth daily. 90 tablet 3   apixaban  (ELIQUIS ) 5 MG TABS tablet Take 1 tablet (5 mg total) by mouth 2 (two) times daily. 60 tablet 3   diazepam  (VALIUM ) 2  MG tablet Take 1 tablet (2 mg total) by mouth 2 (two) times daily as needed for anxiety. Home med.     dibucaine (NUPERCAINAL) 1 % OINT Place 1 Application rectally as needed for hemorrhoids.     dorzolamide  (TRUSOPT ) 2 % ophthalmic solution Place 1 drop into both eyes 2 (two) times daily.     famotidine  (PEPCID ) 40 MG tablet Take 40 mg by mouth at bedtime.     feeding supplement (ENSURE PLUS HIGH PROTEIN) LIQD Take 237 mLs by mouth 3 (three) times daily between meals. 237 mL 0    latanoprost  (XALATAN ) 0.005 % ophthalmic solution 1 drop at bedtime.     loperamide  (IMODIUM ) 1 MG/5ML solution Take 1 mg by mouth daily as needed for diarrhea or loose stools.     losartan  (COZAAR ) 25 MG tablet Take 1 tablet (25 mg total) by mouth daily. (Patient taking differently: Take 25 mg by mouth at bedtime.) 30 tablet 2   meclizine  (ANTIVERT ) 25 MG tablet Take 25 mg by mouth 3 (three) times daily as needed for dizziness.     metoprolol  tartrate (LOPRESSOR ) 25 MG tablet Take 0.5 tablets (12.5 mg total) by mouth 2 (two) times daily. (Patient taking differently: Take 25 mg by mouth 2 (two) times daily.) 15 tablet 2   omeprazole 20 MG TBDD disintegrating tablet 1 capsule.     ondansetron  (ZOFRAN ) 4 MG tablet Take 4 mg by mouth every 8 (eight) hours as needed for nausea or vomiting.     oxyCODONE  (OXY IR/ROXICODONE ) 5 MG immediate release tablet Take 1 tablet (5 mg total) by mouth every 12 (twelve) hours as needed for moderate pain (pain score 4-6). 15 tablet 0   sodium chloride  flush 0.9 % SOLN injection Flush biliary cathter with 5-10mL saline daily to prevent clogging 300 mL 1   Testosterone 12.5 MG/ACT (1%) GEL Apply 12.5 mg topically daily.     No current facility-administered medications for this visit.    VITAL SIGNS: There were no vitals taken for this visit. There were no vitals filed for this visit.  Estimated body mass index is 43.96 kg/m as calculated from the following:   Height as of 06/29/24: 5' (1.524 m).   Weight as of an earlier encounter on 07/13/24: 225 lb 1.6 oz (102.1 kg).  LABS: CBC:    Component Value Date/Time   WBC 12.4 (H) 06/30/2024 0337   HGB 10.0 (L) 06/30/2024 0337   HGB 12.2 08/15/2023 1147   HCT 30.3 (L) 06/30/2024 0337   PLT 291 06/30/2024 0337   PLT 181 08/15/2023 1147   MCV 83.0 06/30/2024 0337   NEUTROABS 14.3 (H) 06/29/2024 1639   LYMPHSABS 1.2 06/29/2024 1639   MONOABS 1.2 (H) 06/29/2024 1639   EOSABS 0.0 06/29/2024 1639   BASOSABS 0.0  06/29/2024 1639   Comprehensive Metabolic Panel:    Component Value Date/Time   NA 140 07/01/2024 0409   K 4.0 07/01/2024 0409   CL 108 07/01/2024 0409   CO2 23 07/01/2024 0409   BUN 25 (H) 07/01/2024 0409   CREATININE 1.33 (H) 07/01/2024 0409   CREATININE 1.01 (H) 08/15/2023 1147   GLUCOSE 129 (H) 07/01/2024 0409   CALCIUM  8.8 (L) 07/01/2024 0409   AST 34 06/30/2024 0337   AST 16 08/15/2023 1147   ALT 16 06/30/2024 0337   ALT 12 08/15/2023 1147   ALKPHOS 142 (H) 06/30/2024 0337   BILITOT 1.2 06/30/2024 0337   BILITOT 0.6 08/15/2023 1147   PROT 6.3 (L) 06/30/2024  9662   ALBUMIN 2.4 (L) 06/30/2024 0337    RADIOGRAPHIC STUDIES: DG Abd 2 Views Result Date: 07/01/2024 CLINICAL DATA:  Constipation. EXAM: ABDOMEN - 2 VIEW COMPARISON:  CT 06/08/2024 FINDINGS: No bowel dilatation or evidence of obstruction. Air and small volume of stool throughout nondilated colon. Small volume of stool in the rectum. Multiple surgical clips in the left upper quadrant. A few surgical clips in the pelvis. Right upper quadrant biliary stent. No free intra-abdominal air. IMPRESSION: Small volume formed stool in the colon. No bowel obstruction. Electronically Signed   By: Andrea Gasman M.D.   On: 07/01/2024 00:03   IR CONVERT BILIARY DRAIN TO INT EXT BILIARY DRAIN Result Date: 06/29/2024 INDICATION: Biliary obstruction in a patient with an indwelling biliary drain previously placed 06/09/2024. On 06/22/2024, the patient returned for further evaluation to find that the catheter had partially retracted internally with a redundant loop of the 10 [place] biliary drain extra capsular to the liver. Despite multiple attempts to gain access across the common bile duct obstruction, the biliary drainage catheter would not track across this obstruction and a drain was left in the common bile duct. The patient returns today for further evaluation and potential new access or converting the catheter to a better positioned  internal/external biliary drainage catheter EXAM: Drain exchange COMPARISON:  None Available. ANESTHESIA/SEDATION: Moderate (conscious) sedation was employed during this procedure. A total of Versed  3 mg and Fentanyl  200 mcg was administered intravenously by the radiology nurse. Total intra-service moderate Sedation Time: 30 minutes. The patient's level of consciousness and vital signs were monitored continuously by radiology nursing throughout the procedure under my direct supervision. CONTRAST:  25 mL Omnipaque  300-administered into the collecting system(s) FLUOROSCOPY: Radiation Exposure Index (as provided by the fluoroscopic device): 406 mGy Kerma COMPLICATIONS: None immediate. PROCEDURE: Informed written consent was obtained from the patient after a thorough discussion of the procedural risks, benefits and alternatives. All questions were addressed. Maximal Sterile Barrier Technique was utilized including caps, mask, sterile gowns, sterile gloves, sterile drape, hand hygiene and skin antiseptic. A timeout was performed prior to the initiation of the procedure. In a supine position, the right upper quadrant and external portion of the catheter were prepped and draped in usual sterile fashion. Dilute contrast was injected into the indwelling biliary drain filling the right lobe bile ducts as well as the common bile duct. Common bile duct is markedly dilated with moderate dilatation of the intrahepatic ducts. Multiple attempts were then made to advance a guidewire through the pigtail catheter and out through the ampulla into the duodenal without success. The pigtail catheter was cut and removed over a Bentson guidewire. An angled glide catheter was then advanced over the Bentson and attempts at reducing the redundant extra capsular loop were unsuccessful. The catheter was then directed toward the ampulla within the common bile duct and using a combination of Glidewire and Bentson guidewire finally gaining access  into the duodenum. Once duodenal access was obtained, contrast was injected verifying intraluminal position within the bowel. The Bentson guidewire was then advanced into the jejunum until enough guidewire was demonstrated to be looping redundantly within the jejunum giving a long length of guidewire position within the small bowel. The glide catheter was then withdrawn. Slow gentle traction was then applied to the guidewire with minimal reduction in the redundant extra capsular loop. Rapid and somewhat forceful traction was then applied to the guidewire and the redundant loop was completely reduced. At this point a 6 [place] 25  cm sheath was advanced over the guidewire into the common bile duct and directed toward the ampulla. The introducer was then removed and the angled glide catheter was advanced over the guidewire into the jejunum. The guidewire was then removed and exchanged for an Amplatz wire. The sheath and catheter were then completely removed leaving the guidewire in position. An 8 [place] by 40 cm skater biliary drain was advanced over the guidewire and coiled within the duodenal. Contrast was again injected demonstrating final position. Retention suture was applied. Sterile dressing applied. Catheter was connected to gravity drainage. IMPRESSION: Satisfactory reduction of the extra capsular loop and a advancement of the distal tip of the catheter into the bowel completing the internal/external biliary drain placement in a good position for full function. The catheter will remain connected to gravity drainage. The patient is being admitted for tachycardia, worsening renal function and likely underlying sepsis. IR will continue to monitor bilirubin and drain function. Plan for capping once bilirubin has normalized. The patient had previous difficulty with the internal external drain care and may need extra education and support. Short-term follow-up in the clinic after discharge would likely be beneficial.  The patient will be scheduled for a 4 week return for drain change and evaluation as well. Electronically Signed   By: Cordella Banner   On: 06/29/2024 14:14   IR EXCHANGE BILIARY DRAIN Result Date: 06/22/2024 INDICATION: Status post percutaneous biliary drainage procedure with placement of 10 French internal/external biliary drainage catheter via right lobe percutaneous biliary access on 06/09/2024. There is significant leakage of bile around the tube exit site currently. EXAM: EXCHANGE OF BILIARY DRAINAGE CATHETER UNDER FLUOROSCOPY INCLUDING CHOLANGIOGRAM MEDICATIONS: None ANESTHESIA/SEDATION: None FLUOROSCOPY: Radiation Exposure Index (as provided by the fluoroscopic device): 541 mGy Kerma CONTRAST:  25 mL Omnipaque  300 COMPLICATIONS: None immediate. PROCEDURE: Informed written consent was obtained from the patient after a thorough discussion of the procedural risks, benefits and alternatives. All questions were addressed. Maximal Sterile Barrier Technique was utilized including caps, mask, sterile gowns, sterile gloves, sterile drape, hand hygiene and skin antiseptic. A timeout was performed prior to the initiation of the procedure. Initial fluoroscopy was performed of the indwelling biliary drainage catheter. The exiting drain was injected with contrast. The drainage catheter was then removed over a guidewire and a 5 French catheter advanced over the wire. Contrast was injected via the catheter to opacify the drain tract. The catheter was then advanced over a hydrophilic guidewire into right intrahepatic bile ducts followed by the common bile duct. The 5 French catheter was further advanced to the level of the duodenum and a guidewire advanced into the duodenum. A 10 French internal/external biliary drainage catheter was attempted to be advanced over various guidewires. Ultimately, a 10 [place] multipurpose drainage catheter was advanced over a guidewire and partially formed. The catheter was injected with  contrast to confirm final positioning and attached to a gravity drainage bag. The catheter was secured at the skin exit site with a Prolene retention suture and StatLock device. FINDINGS: Initial fluoroscopy demonstrates severe retraction of the previously placed internal/external biliary drain nearly out of the liver with some drainage side holes located outside of the costal margin. Injection of contrast barely opacifies the liver. A 5 French catheter was placed and contrast injection did opacify a tract back into right intrahepatic bile ducts. There is a persistent redundant and tortuous tract peripheral to bile ducts outside of the liver. The 5 French catheter was ultimately able to be passed centrally through  the common bile duct to the level of a high-grade stricture of the distal common bile duct and into the duodenum. Despite access through to the level of the duodenum, a 10 [place] internal/external biliary drain could not be fully advanced into the duodenum due to buckling of guidewire and catheter outside of the liver which prevented the drain to cross the distal CBD stricture. Ultimately, a standard 10 [place] multipurpose drain was advanced with sideholes predominantly in right-sided intrahepatic ducts and the distal portion of the catheter extending just to the origin of the common bile duct. This drain is draining bile and will be left to gravity drainage. As this drain will not be adequate for long-term drainage, the patient will be brought back for either re-attempted conversion to an internal/external biliary drain or new biliary drain placement via new percutaneous biliary access under moderate IV conscious sedation. IMPRESSION: 1. Previously placed internal/external biliary drainage catheter had retracted nearly out of the liver and bile ducts. 2. Tract back into right intrahepatic bile ducts and across the CBD was not able to be recanalized by a 5 French catheter to the level of the duodenum. 3. A  10 French internal/external biliary drainage catheter could not be advanced into the duodenum due to buckling of guidewire and catheter outside of the liver, per venting the drain to cross the CBD. 4. A standard 10 [place] multipurpose drain was advanced into right-sided intrahepatic ducts and just to the level of the origin of the common bile duct. This will be left to external gravity drainage. 5. The patient will be brought back for re-attempted conversion versus new percutaneous biliary drainage catheter placement under moderate IV conscious sedation. Electronically Signed   By: Marcey Moan M.D.   On: 06/22/2024 15:54   IR Radiologist Eval & Mgmt Result Date: 06/16/2024 EXAM: Please see Epic Note CHIEF COMPLAINT: Please see Epic Note HISTORY OF PRESENT ILLNESS: Please see Epic Note REVIEW OF SYSTEMS: Please see Epic Note PHYSICAL EXAMINATION: Please see Epic Note ASSESSMENT AND PLAN: Please see Epic Note Electronically Signed   By: Cordella Banner   On: 06/16/2024 08:12    PERFORMANCE STATUS (ECOG) : 2 - Symptomatic, <50% confined to bed  Review of Systems Unless otherwise noted, a complete review of systems is negative.  Physical Exam General: Frail Pulmonary: Unlabored Abdomen: soft, nontender, + bowel sounds, biliary drain GU: no suprapubic tenderness Extremities: no edema, no joint deformities Skin: no rashes Neurological: Weakness but otherwise nonfocal  IMPRESSION: Patient was an add-on to my clinic schedule today at Dr. Darold request.  I met with patient and husband.  Patient recently hospitalized at [facility] with sepsis.  Doing better since returning home but remains frail, weak, with poor oral intake.  Patient endorses multiple other symptoms including generalized abdominal pain, constipation, insomnia, and depression.  Patient had home health ordered at time of discharge from the hospital but they have yet to start services.  Husband is her primary caregiver at this point.   Patient has ordered a bariatric walker but is yet to receive it.  She is having difficulty ambulating around the house and is mostly sedentary.  She asks about home resources.  Will consult community palliative care and social work.  Patient says that her pain is improved when she takes oxycodone .  However, at present she is only utilizing it twice daily at most.  Will increase to every 8 hours dosing.  Discussed adding bowel regimen to prevent opioid-induced constipation.  Appetite is poor.  Will consult nutrition.  Recommended increased protein/calorie foods and adding regular nutritional supplements.  Patient also endorses depressive symptoms.  She feels down and tearful regarding her cancer.  She is also having difficulty sleeping.  Patient says that she was started recently on trazodone, which helped the insomnia, but she is out of this medication.  Will rotate from trazodone to mirtazapine  as this might also help her appetite.  Patient is aware that she has an advanced stage malignancy.  She says that she has strong faith that God will heal her.  She is in agreement with current plan for biopsy and is interested in discussing options with medical oncology.  Patient says that she is in process of completing 5 wishes ACP documents.  I requested that she bring in a copy to be scanned into her chart.  Also sent her home with a MOST form to review.  PLAN: - Continue current scope of treatment - Discontinue trazodone - Start mirtazapine  7.5 mg nightly - Refill oxycodone  and increase to every 8 hour dosing as needed - Daily bowel regimen with MiraLAX /Senokot - Referrals to nutrition, social work, community palliative care and [facility] - Follow up TBD  Case and plan discussed with Dr. Melanee  Patient expressed understanding and was in agreement with this plan. She also understands that She can call the clinic at any time with any questions, concerns, or complaints.    Addendum: CMP shows AKI  with creatinine worsening from 1.33-3.06 over the last 12 days.  Patient brought back to clinic for IV fluids.  Discussed option for outpatient management with fluid resuscitation and daily labs but patient states that she feels weak and would prefer to go to the hospital.  Report called to ED triage RN.   Time Total: 25 minutes  Visit consisted of counseling and education dealing with the complex and emotionally intense issues of symptom management and palliative care in the setting of serious and potentially life-threatening illness.Greater than 50%  of this time was spent counseling and coordinating care related to the above assessment and plan.  Signed by: Fonda Mower, PhD, NP-C

## 2024-07-13 NOTE — ED Notes (Signed)
 Call placed to luis at central monitoring

## 2024-07-13 NOTE — Assessment & Plan Note (Signed)
 Not in acute exacerbation Albuterol  2 puff inhalation as needed every 6 hours for wheezing and shortness of breath

## 2024-07-13 NOTE — ED Triage Notes (Signed)
 Abnormal blood work from today at [facility]. Results in chart.  Bun: 41  Cr: 3.06  Hx renal cell carcinoma and renal mass. Biliary drain placed.  AKI  1l/ NS given. PTA

## 2024-07-13 NOTE — Progress Notes (Signed)
 Alyssa Leonard was seen in clinic today by Dr. Melanee for RCC, obstructive jaundice and pancreas mass. She presents with a biliary drain in place. [facility] radiology and they are reaching out to her to arrange evaluation and management of drain in [place]. Dr. Melanee is requesting repeat EUS and biopsy of enlarging pancreas mass with elevated CA 19-9. Referral sent to [facility] and request made for [facility].

## 2024-07-13 NOTE — H&P (Signed)
 History and Physical   Alyssa Leonard FMW:[id number] DOB: Mar 16, 1956 DOA: 07/13/2024  PCP: [name] LABOR, MD Outpatient Specialists: Dr. Melanee, oncology Patient coming from: Home  I have personally briefly reviewed patient's old medical records in [facility] EMR.  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  Vitals in the ED showed t of 98, rr 18, heart rate 91, blood pressure 141/53, SpO2 100% on room air.  Serum sodium is 137, potassium 4.7, chloride 103, bicarb 21, BUN of 43, serum creatinine 3.05, eGFR 16, nonfasting blood glucose 139, WBC 8.4, hemoglobin 11.8, platelets of 231.  HS troponin was 11.  ED treatment: Sodium chloride  1 L bolus. --------------------------------- At bedside, patient she is able to tell me her first and last name, age, current calendar year, and location.   She has not been eating very well since the cancer diagnosis. She endorses nausea and chills. She denies vomiting, and fever, chest pain, dysuria, hematuria, diarrhea.   She denies trauma. She endorses constipation.   Social history: She lives with her life partner of 20 years, Morene. She denies tobacco, etoh, recreational drug use. She is retired.   ROS: Constitutional: no weight change, no fever, + chills ENT/Mouth: no sore throat, no rhinorrhea Eyes: no eye pain, no vision changes Cardiovascular: no chest pain, no dyspnea,  no edema, no palpitations Respiratory: no cough, no sputum, no wheezing Gastrointestinal: + nausea, no vomiting, no diarrhea, no constipation Genitourinary: no urinary incontinence, no dysuria, no  hematuria Musculoskeletal: no arthralgias, no myalgias Skin: no skin lesions, no pruritus, Neuro: + weakness, no loss of consciousness, no syncope Psych: no anxiety, no depression, + decrease appetite Heme/Lymph: no bruising, no bleeding  ED Course: Discussed with EDP, patient requiring hospitalization for chief concerns of acute kidney injury, not tolerating p.o. intake.  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 intake Status post sodium chloride  1 L bolus per EDP Continue with LR infusion, 125 mL/h, 1 day ordered Recheck BMP in a.m.  Paroxysmal atrial flutter (HCC) Home Eliquis  5 mg p.o. twice daily resumed  OSA (obstructive sleep apnea) CPAP nightly ordered  COPD (chronic obstructive pulmonary disease) (HCC) Not in acute exacerbation Albuterol  2 puff inhalation as needed every 6 hours for wheezing and shortness of breath  Neoplasm related pain PDMP reviewed Active prescription include oxycodone  5 mg tablet, 15 tablets, 8-day supply written and filled on 07/01/2024  Chart reviewed.   DVT prophylaxis: Eliquis  Code Status: full code   Diet: Heart healthy Family Communication: no  Disposition Plan: Pending clinical course Consults called: no  Admission status: Telemetry medical, observation  Past Medical History:  Diagnosis Date   Asthma    Atrial flutter (HCC)    COPD (chronic obstructive pulmonary disease) (HCC)    Diabetes mellitus  without complication (HCC)    GERD (gastroesophageal reflux disease)     History of colon polyps    Hypertension    Meniere disease    OSA (obstructive sleep apnea)    Renal cell carcinoma (HCC)    Sleep apnea    Went for sleep study test in January 2018 I haven't heard back from test   Past Surgical History:  Procedure Laterality Date   ABDOMINAL HYSTERECTOMY     BARIATRIC SURGERY     CHOLECYSTECTOMY     COLONOSCOPY WITH PROPOFOL  N/A 12/11/2016   Procedure: COLONOSCOPY WITH PROPOFOL ;  Surgeon: Lamar ONEIDA Holmes, MD;  Location: [facility] ENDOSCOPY;  Service: Endoscopy;  Laterality: N/A;   COLONOSCOPY WITH PROPOFOL  N/A 11/27/2021   Procedure: COLONOSCOPY WITH PROPOFOL ;  Surgeon: Maryruth Ole ONEIDA, MD;  Location: [facility] ENDOSCOPY;  Service: Endoscopy;  Laterality: N/A;   IR CONVERT BILIARY DRAIN TO INT EXT BILIARY DRAIN  06/29/2024   IR EXCHANGE BILIARY DRAIN  06/22/2024   IR INT EXT BILIARY DRAIN WITH CHOLANGIOGRAM  06/09/2024   IR RADIOLOGIST EVAL & MGMT  06/15/2024   Social History:  reports that she quit smoking about 30 years ago. Her smoking use included cigarettes. She has never used smokeless tobacco. She reports that she does not drink alcohol and does not use drugs.  Allergies  Allergen Reactions   Latex Other (See Comments), Dermatitis, Hives and Swelling    unknown  Other reaction(s): Other (See Comments)  unknown  Other reaction(s): Other (See Comments) unknown    unknown   Aspirin Other (See Comments) and Nausea And Vomiting    Makes stomach burn  Other reaction(s): Other (See Comments)  Makes stomach burn  Makes stomach burn    Other reaction(s): Other (See Comments) Makes stomach burn   Plasticized Base [Plastibase]    Family History  Problem Relation Age of Onset   Hypertension Mother    Kidney disease Sister    Kidney disease Brother    Heart disease Brother    Heart disease Brother    Breast cancer Neg Hx    Family history: Family history reviewed and not pertinent.  Prior to Admission medications   Medication Sig Start  Date End Date Taking? Authorizing Provider  acetaminophen  (TYLENOL ) 500 MG tablet Take 1,000 mg by mouth every 6 (six) hours as needed for mild pain (pain score 1-3).    [provider]  albuterol  (PROVENTIL  HFA;VENTOLIN  HFA) 108 (90 Base) MCG/ACT inhaler Inhale 2 puffs into the lungs every 6 (six) hours as needed for wheezing or shortness of breath.    [provider]  amiodarone  (PACERONE ) 200 MG tablet Take 1 tablet (200 mg total) by mouth daily. 04/20/24   Riddle, Suzann, NP  apixaban  (ELIQUIS ) 5 MG TABS tablet Take 1 tablet (5 mg total) by mouth 2 (two) times daily. 07/01/24   Patel, Sona, MD  diazepam  (VALIUM ) 2 MG tablet Take 1 tablet (2 mg total) by mouth 2 (two) times daily as needed for anxiety. Home med. 04/01/24   Awanda City, MD  dibucaine (NUPERCAINAL) 1 % OINT Place 1 Application rectally as needed for hemorrhoids.    [provider]  dorzolamide  (TRUSOPT ) 2 % ophthalmic solution Place 1 drop into both eyes 2 (two) times daily.    [provider]  famotidine  (PEPCID ) 40 MG tablet Take 40 mg by mouth at bedtime. 02/12/24   [provider]  feeding supplement (ENSURE PLUS HIGH PROTEIN) LIQD Take 237 mLs by mouth 3 (three)  times daily between meals. 07/01/24   Patel, Sona, MD  latanoprost  (XALATAN ) 0.005 % ophthalmic solution 1 drop at bedtime.    [provider]  loperamide  (IMODIUM ) 1 MG/5ML solution Take 1 mg by mouth daily as needed for diarrhea or loose stools.    [provider]  losartan  (COZAAR ) 25 MG tablet Take 1 tablet (25 mg total) by mouth daily. Patient taking differently: Take 25 mg by mouth at bedtime. 04/01/24   Awanda City, MD  meclizine  (ANTIVERT ) 25 MG tablet Take 25 mg by mouth 3 (three) times daily as needed for dizziness.    [provider]  metoprolol  tartrate (LOPRESSOR ) 25 MG tablet Take 0.5 tablets (12.5 mg total) by mouth 2 (two) times daily. Patient taking differently: Take 25 mg by mouth 2 (two) times  daily. 04/01/24   Awanda City, MD  mirtazapine  (REMERON ) 7.5 MG tablet Take 1 tablet (7.5 mg total) by mouth at bedtime. 07/13/24   Borders, Fonda SAUNDERS, NP  omeprazole 20 MG TBDD disintegrating tablet 1 capsule.    [provider]  ondansetron  (ZOFRAN ) 4 MG tablet Take 4 mg by mouth every 8 (eight) hours as needed for nausea or vomiting.    [provider]  oxyCODONE  (OXY IR/ROXICODONE ) 5 MG immediate release tablet Take 1 tablet (5 mg total) by mouth every 8 (eight) hours as needed for moderate pain (pain score 4-6). 07/13/24   Borders, Fonda SAUNDERS, NP  sodium chloride  flush 0.9 % SOLN injection Flush biliary cathter with 5-10mL saline daily to prevent clogging 06/11/24   Wouk, Devaughn Sayres, MD  Testosterone 12.5 MG/ACT (1%) GEL Apply 12.5 mg topically daily. 01/05/24   [provider]   Physical Exam Vitals:   07/13/24 1613 07/13/24 2032 07/13/24 2043 07/13/24 2100  BP: (!) 121/54  120/67 (!) 141/53  Pulse: 97  91 91  Resp: 16  18   Temp: (!) 97.5 F (36.4 C)  98 F (36.7 C)   TempSrc: Oral  Oral   SpO2: 100% 100% 100%   Weight:       Constitutional: appears age approrpriate, NAD, calm Eyes: PERRL, lids and conjunctivae normal ENMT: Mucous membranes are moist. Posterior pharynx clear of any exudate or lesions. Age-appropriate dentition. Hearing appropriate Neck: normal, supple, no masses, no thyromegaly Respiratory: clear to auscultation bilaterally, no wheezing, no crackles. Normal respiratory effort. No accessory muscle use.  Cardiovascular: Regular rate and rhythm, no murmurs / rubs / gallops. No extremity edema. 2+ pedal pulses. No carotid bruits.  Abdomen: obese abdomen, no tenderness, no masses palpated, no hepatosplenomegaly. Bowel sounds positive. Drain in place Musculoskeletal: no clubbing / cyanosis. No joint deformity upper and lower extremities. Good ROM, no contractures, no atrophy. Normal muscle tone.  Skin: no rashes, lesions, ulcers. No  induration Neurologic: Sensation intact. Strength 5/5 in all 4.  Psychiatric: Normal judgment and insight. Alert and oriented x 3. Normal mood.   EKG: independently reviewed, showing sinus rhythm with rate of 78, QTc 444  Chest x-ray on Admission: Not indicated at this time  No results found.  Labs on Admission: I have personally reviewed following labs  CBC: Recent Labs  Lab 07/13/24 1042 07/13/24 2041  WBC 8.2 8.4  NEUTROABS 5.8 5.5  HGB 12.4 11.8*  HCT 38.4 36.6  MCV 85.9 85.7  PLT 257 231   Basic Metabolic Panel: Recent Labs  Lab 07/13/24 1042 07/13/24 2041  NA 135 137  K 4.9 4.7  CL 102 103  CO2 20* 21*  GLUCOSE 240* 139*  BUN 41* 43*  CREATININE 3.06* 3.05*  CALCIUM  9.9 9.7   GFR: Estimated Creatinine Clearance: 19 mL/min (A) (by C-G formula based on SCr of 3.05 mg/dL (H)).  Liver Function Tests: Recent Labs  Lab 07/13/24 1042 07/13/24 2041  AST 59* 64*  ALT 43 45*  ALKPHOS 164* 149*  BILITOT 1.8* 1.6*  PROT 8.0 7.7  ALBUMIN 3.7 3.3*   Urine analysis:    Component Value Date/Time   COLORURINE AMBER (A) 06/30/2024 0522   APPEARANCEUR HAZY (A) 06/30/2024 0522   LABSPEC 1.016 06/30/2024 0522   PHURINE 5.0 06/30/2024 0522   GLUCOSEU NEGATIVE 06/30/2024 0522   HGBUR NEGATIVE 06/30/2024 0522   BILIRUBINUR NEGATIVE 06/30/2024 0522   KETONESUR 5 (A) 06/30/2024 0522   PROTEINUR NEGATIVE 06/30/2024 0522   NITRITE NEGATIVE 06/30/2024 0522   LEUKOCYTESUR NEGATIVE 06/30/2024 0522   This document was prepared using Dragon Voice Recognition software and may include unintentional dictation errors.  Dr. Sherre [facility]  If 7PM-7AM, please contact overnight-coverage provider If 7AM-7PM, please contact day attending provider [email or web address]  07/13/2024, 9:55 PM

## 2024-07-13 NOTE — Assessment & Plan Note (Signed)
Home Eliquis 5 mg p.o. twice daily resumed

## 2024-07-13 NOTE — Assessment & Plan Note (Signed)
 Secondary to poor p.o. intake Status post sodium chloride  1 L bolus per EDP Continue with LR infusion, 125 mL/h, 1 day ordered Recheck BMP in a.m.

## 2024-07-14 ENCOUNTER — Ambulatory Visit

## 2024-07-14 DIAGNOSIS — N179 Acute kidney failure, unspecified: Secondary | ICD-10-CM | POA: Diagnosis not present

## 2024-07-14 LAB — TROPONIN I (HIGH SENSITIVITY): Troponin I (High Sensitivity): 10 ng/L (ref <18)

## 2024-07-14 LAB — CBC
HCT: 30.7 % — ABNORMAL LOW (ref 36.0–46.0)
Hemoglobin: 10 g/dL — ABNORMAL LOW (ref 12.0–15.0)
MCH: 28.1 pg (ref 26.0–34.0)
MCHC: 32.6 g/dL (ref 30.0–36.0)
MCV: 86.2 fL (ref 80.0–100.0)
Platelets: 182 K/uL (ref 150–400)
RBC: 3.56 MIL/uL — ABNORMAL LOW (ref 3.87–5.11)
RDW: 16.7 % — ABNORMAL HIGH (ref 11.5–15.5)
WBC: 7.5 K/uL (ref 4.0–10.5)
nRBC: 0 % (ref 0.0–0.2)

## 2024-07-14 LAB — GLUCOSE, CAPILLARY
Glucose-Capillary: 119 mg/dL — ABNORMAL HIGH (ref 70–99)
Glucose-Capillary: 166 mg/dL — ABNORMAL HIGH (ref 70–99)

## 2024-07-14 LAB — BASIC METABOLIC PANEL WITH GFR
Anion gap: 10 (ref 5–15)
BUN: 40 mg/dL — ABNORMAL HIGH (ref 8–23)
CO2: 22 mmol/L (ref 22–32)
Calcium: 8.9 mg/dL (ref 8.9–10.3)
Chloride: 107 mmol/L (ref 98–111)
Creatinine, Ser: 2.79 mg/dL — ABNORMAL HIGH (ref 0.44–1.00)
GFR, Estimated: 18 mL/min — ABNORMAL LOW (ref >60)
Glucose, Bld: 96 mg/dL (ref 70–99)
Potassium: 4.7 mmol/L (ref 3.5–5.1)
Sodium: 139 mmol/L (ref 135–145)

## 2024-07-14 LAB — CA 19-9 (SERIAL): CA 19-9: 4303 U/mL — ABNORMAL HIGH (ref 0–35)

## 2024-07-14 LAB — CBG MONITORING, ED
Glucose-Capillary: 72 mg/dL (ref 70–99)
Glucose-Capillary: 81 mg/dL (ref 70–99)
Glucose-Capillary: 93 mg/dL (ref 70–99)

## 2024-07-14 LAB — CHROMOGRANIN A: Chromogranin A (ng/mL): 714.5 ng/mL — ABNORMAL HIGH (ref 0.0–101.8)

## 2024-07-14 MED ORDER — METOCLOPRAMIDE HCL 5 MG/ML IJ SOLN
5.0000 mg | Freq: Three times a day (TID) | INTRAMUSCULAR | Status: DC
  Administered 2024-07-14 – 2024-08-03 (×55): 5 mg via INTRAVENOUS
  Filled 2024-07-14 (×57): qty 2

## 2024-07-14 MED ORDER — FAMOTIDINE 20 MG PO TABS
20.0000 mg | ORAL_TABLET | Freq: Two times a day (BID) | ORAL | Status: DC | PRN
Start: 2024-07-14 — End: 2024-07-28
  Administered 2024-07-14 – 2024-07-23 (×5): 20 mg via ORAL
  Filled 2024-07-14 (×5): qty 1

## 2024-07-14 NOTE — Progress Notes (Addendum)
 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  Patient reports that she has had poor p.o. intake since cancer diagnosis.  She also endorses recurrent nausea.  She has recently met with oncology and palliative care and is planning for biopsy of the pancreatic mass  Subjective: Patient is endorsing abdominal fullness and nausea at this time.   Objective: Vitals:   07/14/24 1030 07/14/24 1100 07/14/24 1300 07/14/24 1600  BP: 136/88  108/67 (!) 152/95  Pulse: 60  66 66  Resp: 18  19 13   Temp:   98.2 F (36.8 C)   TempSrc:   Oral   SpO2: 100%  100% 100%  Weight:      Height:  5' (1.524 m)      Intake/Output Summary (Last 24 hours) at 07/14/2024 1622 Last data filed at 07/14/2024 0500 Gross per 24 hour  Intake --  Output 350 ml  Net -350 ml   Filed Weights   07/13/24 1612  Weight: 102 kg    Examination: General exam: Appears calm and comfortable, NAD  Respiratory system: No work of breathing, symmetric chest wall expansion Cardiovascular system: S1 & S2 heard, RRR.  Gastrointestinal system: Abdomen is nondistended, soft, palpation epigastrium and right upper quadrant Psychiatry: Mood & affect appropriate for situation.   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 intake certainly complicated by massive renal mass - Baseline creatinine 1 month ago 0.76 - Continue with fluid rehydration - Trend CMP - Encourage p.o. intake - Renally dosed with a creatinine clearance of 20 when needed - Avoid nephrotoxic medications  Nausea Abdominal fullness - Secondary to pancreatic mass - As needed Zofran  - Scheduled Reglan  - Palliative care consulted.  Patient is established with palliative outpatient  Pancreatic mass - Presumed primary cause of all of the above - CT scan from 8/12 reviewed: Right renal mass 11 x 9 x 9 cm, abutting the adjacent duodenum.  Mass of pancreatic head 3 x 5 cm. - Patient has recently established with oncology.  She is planning for outpatient biopsy - CA 19 recently ordered and pending - Palliative care consulted, at this time prognosis appears unfortunately poor  History renal cell carcinoma - Continue outpatient follow-up with oncology  Paroxysmal A-fib - Continue Eliquis   OSA - Continue CPAP  COPD, no acute exacerbation - As needed home meds  DVT prophylaxis: Eliquis    Code Status: Full Code Disposition:  Inpatient until clinical improvement  Consultants:    Procedures:    Antimicrobials:  Anti-infectives (From admission, onward)    None       Data Reviewed: I have personally reviewed following labs and imaging studies CBC: Recent Labs  Lab 07/13/24  1042 07/13/24 2041 07/14/24 0323  WBC 8.2 8.4 7.5  NEUTROABS 5.8 5.5  --   HGB 12.4 11.8* 10.0*  HCT 38.4 36.6 30.7*  MCV 85.9 85.7 86.2  PLT 257 231 182   Basic Metabolic Panel: Recent Labs  Lab 07/13/24 1042 07/13/24 2041 07/14/24 0323  NA 135 137 139  K 4.9 4.7 4.7  CL 102 103 107  CO2 20* 21* 22  GLUCOSE 240* 139* 96  BUN 41* 43* 40*  CREATININE 3.06* 3.05* 2.79*  CALCIUM  9.9 9.7 8.9   GFR: Estimated Creatinine  Clearance: 20.7 mL/min (A) (by C-G formula based on SCr of 2.79 mg/dL (H)). Liver Function Tests: Recent Labs  Lab 07/13/24 1042 07/13/24 2041  AST 59* 64*  ALT 43 45*  ALKPHOS 164* 149*  BILITOT 1.8* 1.6*  PROT 8.0 7.7  ALBUMIN 3.7 3.3*   CBG: Recent Labs  Lab 07/14/24 0007 07/14/24 0751 07/14/24 1155  GLUCAP 93 72 81    No results found for this or any previous visit (from the past 240 hours).   Radiology Studies: No results found.  Scheduled Meds:  amiodarone   200 mg Oral Daily   apixaban   5 mg Oral BID   docusate sodium   100 mg Oral BID   feeding supplement  237 mL Oral TID BM   insulin  aspart  0-15 Units Subcutaneous TID WC   insulin  aspart  0-5 Units Subcutaneous QHS   metoCLOPramide  (REGLAN ) injection  5 mg Intravenous Q8H   metoprolol  tartrate  25 mg Oral BID   mirtazapine   7.5 mg Oral QHS   Continuous Infusions:  lactated ringers  125 mL/hr at 07/14/24 0918     LOS: 0 days  MDM: Patient is high risk for one or more organ failure.  They necessitate ongoing hospitalization for continued IV therapies and subsequent lab monitoring. Total time spent interpreting labs and vitals, reviewing the medical record, coordinating care amongst consultants and care team members, directly assessing and discussing care with the patient and/or family: 55 min  Alyssa Cannella, DO [facility]  To contact the attending physician between 7A-7P please use Epic Chat. To contact the covering physician during after hours 7P-7A, please review Amion.  07/14/2024, 4:22 PM   *This document has been created with the assistance of dictation software. Please excuse typographical errors. *

## 2024-07-14 NOTE — Plan of Care (Signed)

## 2024-07-14 NOTE — Progress Notes (Signed)
 Attempted x 2 to call CCMD to set up telemetry, nurse placed on hold x 2 for extended amount of time, nurse will attempt again after patient care.

## 2024-07-14 NOTE — ED Notes (Signed)
 Pt ambulated to restroom with stand by assist.

## 2024-07-14 NOTE — Progress Notes (Signed)
 PT STATES SHE HAS A HOME CPAP UNIT BUT DOES NOT USE IT, PT OFFERED A HOSPITAL UNIT WHILE HERE AND SHE REFUSED. NOTIFIED PT THAT IF LATER CHANGES HER MIND AND WANTS A CPAP UNIT TO LET NURSING KNOW SO THEY CAN NOTIFY RT DEPT.

## 2024-07-14 NOTE — Progress Notes (Addendum)
 [facility] Room ED [facility] Liaison Note  [facility] received an outpatient palliative care  referral from [facility] prior to hospitalization.  HL notified [facility] of admission and  submitted hospital documentation to [facility] today.   Please call with any palliative care questions or concerns  Thank you for the opportunity to participate in this patient's care.  [facility] Liaison [phone]

## 2024-07-14 NOTE — ED Notes (Signed)
Pt ambulated to the restroom with standby assist.

## 2024-07-14 NOTE — ED Notes (Signed)
Pt given breakfast meal

## 2024-07-14 NOTE — Progress Notes (Signed)
 Patient arrives to floor RUQ drain, possibly biliary drain to pouch with brownish/orange drainage. Patient stated husband flushes drain once a day, messaged MD for orders.

## 2024-07-15 ENCOUNTER — Other Ambulatory Visit

## 2024-07-15 ENCOUNTER — Encounter

## 2024-07-15 DIAGNOSIS — N179 Acute kidney failure, unspecified: Secondary | ICD-10-CM | POA: Diagnosis not present

## 2024-07-15 DIAGNOSIS — Z515 Encounter for palliative care: Secondary | ICD-10-CM

## 2024-07-15 LAB — COMPREHENSIVE METABOLIC PANEL WITH GFR
ALT: 38 U/L (ref 0–44)
AST: 53 U/L — ABNORMAL HIGH (ref 15–41)
Albumin: 2.7 g/dL — ABNORMAL LOW (ref 3.5–5.0)
Alkaline Phosphatase: 127 U/L — ABNORMAL HIGH (ref 38–126)
Anion gap: 7 (ref 5–15)
BUN: 36 mg/dL — ABNORMAL HIGH (ref 8–23)
CO2: 24 mmol/L (ref 22–32)
Calcium: 9.1 mg/dL (ref 8.9–10.3)
Chloride: 108 mmol/L (ref 98–111)
Creatinine, Ser: 2.6 mg/dL — ABNORMAL HIGH (ref 0.44–1.00)
GFR, Estimated: 19 mL/min — ABNORMAL LOW (ref >60)
Glucose, Bld: 109 mg/dL — ABNORMAL HIGH (ref 70–99)
Potassium: 4.5 mmol/L (ref 3.5–5.1)
Sodium: 139 mmol/L (ref 135–145)
Total Bilirubin: 1.4 mg/dL — ABNORMAL HIGH (ref 0.0–1.2)
Total Protein: 6.5 g/dL (ref 6.5–8.1)

## 2024-07-15 LAB — GLUCOSE, CAPILLARY
Glucose-Capillary: 107 mg/dL — ABNORMAL HIGH (ref 70–99)
Glucose-Capillary: 107 mg/dL — ABNORMAL HIGH (ref 70–99)
Glucose-Capillary: 109 mg/dL — ABNORMAL HIGH (ref 70–99)
Glucose-Capillary: 120 mg/dL — ABNORMAL HIGH (ref 70–99)
Glucose-Capillary: 137 mg/dL — ABNORMAL HIGH (ref 70–99)
Glucose-Capillary: 93 mg/dL (ref 70–99)

## 2024-07-15 LAB — CBC WITH DIFFERENTIAL/PLATELET
Abs Immature Granulocytes: 0.03 K/uL (ref 0.00–0.07)
Basophils Absolute: 0 K/uL (ref 0.0–0.1)
Basophils Relative: 1 %
Eosinophils Absolute: 0.3 K/uL (ref 0.0–0.5)
Eosinophils Relative: 5 %
HCT: 31.8 % — ABNORMAL LOW (ref 36.0–46.0)
Hemoglobin: 10.4 g/dL — ABNORMAL LOW (ref 12.0–15.0)
Immature Granulocytes: 1 %
Lymphocytes Relative: 25 %
Lymphs Abs: 1.5 K/uL (ref 0.7–4.0)
MCH: 27.6 pg (ref 26.0–34.0)
MCHC: 32.7 g/dL (ref 30.0–36.0)
MCV: 84.4 fL (ref 80.0–100.0)
Monocytes Absolute: 0.8 K/uL (ref 0.1–1.0)
Monocytes Relative: 13 %
Neutro Abs: 3.3 K/uL (ref 1.7–7.7)
Neutrophils Relative %: 55 %
Platelets: 190 K/uL (ref 150–400)
RBC: 3.77 MIL/uL — ABNORMAL LOW (ref 3.87–5.11)
RDW: 16.6 % — ABNORMAL HIGH (ref 11.5–15.5)
WBC: 5.9 K/uL (ref 4.0–10.5)
nRBC: 0 % (ref 0.0–0.2)

## 2024-07-15 MED ORDER — SODIUM CHLORIDE 0.9% FLUSH
10.0000 mL | Freq: Two times a day (BID) | INTRAVENOUS | Status: DC
  Administered 2024-07-15 – 2024-08-03 (×36): 10 mL

## 2024-07-15 MED ORDER — SODIUM CHLORIDE 0.9% FLUSH: 10.0000 mL | INTRAVENOUS | Status: DC | PRN

## 2024-07-15 MED ORDER — SODIUM CHLORIDE 0.9 % IV SOLN: INTRAVENOUS | Status: AC

## 2024-07-15 NOTE — Progress Notes (Signed)
 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  Patient reports that she has had poor p.o. intake since cancer diagnosis.  She also endorses recurrent nausea.  She has recently met with oncology and palliative care and is planning for biopsy of the pancreatic mass  Subjective: Patient endorsing some improvement in her appetite today.  Was able to tolerate a small breakfast.  Endorsing some right upper quadrant pain but denies need for additional pain medications at this time  Objective: Vitals:   07/14/24 1650 07/14/24 2115 07/15/24 0428 07/15/24 0747  BP: 136/82 125/69 (!) 110/58 119/72  Pulse: 77 73 68 73  Resp: 17 16 16 16   Temp: 97.8 F (36.6 C) 97.7 F (36.5 C) 97.6 F (36.4 C) 98 F (36.7 C)  TempSrc: Oral Oral Oral Oral  SpO2: 99% 100% 100% 100%  Weight:      Height:        Intake/Output Summary (Last 24 hours) at 07/15/2024 1414 Last data filed at 07/15/2024 1300 Gross per 24 hour  Intake 1138.52 ml  Output 270 ml  Net 868.52 ml   Filed Weights   07/13/24 1612  Weight: 102 kg    Examination: General exam: Appears calm and comfortable, NAD  Respiratory system: No work of breathing, symmetric chest wall expansion Cardiovascular system: S1 & S2 heard, RRR.  Gastrointestinal system: Abdomen is nondistended, soft, tender to palpation in epigastrium and right upper quadrant Psychiatry: Mood & affect appropriate for situation.   Assessment & Plan:  Principal Problem:   AKI (acute kidney injury) (HCC) Active Problems:   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)   Mass of  head of pancreas   COPD (chronic obstructive pulmonary disease) (HCC)   OSA (obstructive sleep apnea)   Paroxysmal atrial flutter (HCC)   Hypertension   Diabetes mellitus without complication (HCC)   GERD (gastroesophageal reflux disease)   Neoplasm related pain   AKI - Suspect this is prerenal secondary to poor p.o. intake certainly complicated by massive renal mass - Baseline creatinine 1 month ago 0.76 - Restart IV fluids today - Encourage p.o. intake - Renally dose with a creatinine clearance of 22 when needed - Continue to trend CMP, creatinine is improving some - Avoid nephrotoxic medications  Nausea Abdominal fullness - Secondary to pancreatic mass and large renal mass - Continue with scheduled Reglan  which appears to be helping some - As needed Zofran  - Palliative care consulted.  Pancreatic mass - Presumed primary cause of all of the above - CT scan from 8/12 reviewed: Right renal mass 11 x 9 x 9 cm, abutting the adjacent duodenum.  Mass of pancreatic head 3 x 5 cm. - Patient has recently established with oncology.  She is planning for outpatient biopsy - CA 19-9 has resulted and is very elevated. - Palliative care consulted, at this time prognosis appears unfortunately poor  History renal cell carcinoma - Very large renal mass causing compression and upward displacement. - Needs outpatient follow-up with oncology   Paroxysmal A-fib - Continue Eliquis   OSA - Continue CPAP  COPD, no acute exacerbation - As needed home meds  DVT prophylaxis: Eliquis    Code Status: Full Code Disposition:  Inpatient until clinical improvement  Consultants:    Procedures:    Antimicrobials:  Anti-infectives (From admission, onward)    None       Data Reviewed: I have personally reviewed following labs and imaging studies CBC: Recent Labs  Lab 07/13/24 1042 07/13/24 2041 07/14/24 0323 07/15/24 0848  WBC 8.2 8.4 7.5 5.9  NEUTROABS 5.8 5.5  --  3.3  HGB 12.4  11.8* 10.0* 10.4*  HCT 38.4 36.6 30.7* 31.8*  MCV 85.9 85.7 86.2 84.4  PLT 257 231 182 190   Basic Metabolic Panel: Recent Labs  Lab 07/13/24 1042 07/13/24 2041 07/14/24 0323 07/15/24 0848  NA 135 137 139 139  K 4.9 4.7 4.7 4.5  CL 102 103 107 108  CO2 20* 21* 22 24  GLUCOSE 240* 139* 96 109*  BUN 41* 43* 40* 36*  CREATININE 3.06* 3.05* 2.79* 2.60*  CALCIUM  9.9 9.7 8.9 9.1   GFR: Estimated Creatinine Clearance: 22.3 mL/min (A) (by C-G formula based on SCr of 2.6 mg/dL (H)). Liver Function Tests: Recent Labs  Lab 07/13/24 1042 07/13/24 2041 07/15/24 0848  AST 59* 64* 53*  ALT 43 45* 38  ALKPHOS 164* 149* 127*  BILITOT 1.8* 1.6* 1.4*  PROT 8.0 7.7 6.5  ALBUMIN 3.7 3.3* 2.7*   CBG: Recent Labs  Lab 07/14/24 1947 07/15/24 0025 07/15/24 0432 07/15/24 0750 07/15/24 1201  GLUCAP 166* 107* 109* 93 107*    No results found for this or any previous visit (from the past 240 hours).   Radiology Studies: No results found.  Scheduled Meds:  amiodarone   200 mg Oral Daily   apixaban   5 mg Oral BID   docusate sodium   100 mg Oral BID   feeding supplement  237 mL Oral TID BM   insulin  aspart  0-15 Units Subcutaneous TID WC   insulin  aspart  0-5 Units Subcutaneous QHS   metoCLOPramide  (REGLAN ) injection  5 mg Intravenous Q8H   metoprolol  tartrate  25 mg Oral BID   mirtazapine   7.5 mg Oral QHS   sodium chloride  flush  10-40 mL Intracatheter Q12H   Continuous Infusions:     LOS: 0 days  MDM: Patient is high risk for one or more organ failure.  They necessitate ongoing hospitalization for continued IV therapies and subsequent lab monitoring. Total time spent interpreting labs and vitals, reviewing the medical record, coordinating care amongst consultants and care team members, directly assessing and discussing care with the patient and/or family: 55 min  Kailyn Vanderslice, DO [facility]  To contact the attending physician between 7A-7P please use Epic Chat. To  contact the covering physician during after hours 7P-7A, please review Amion.  07/15/2024, 2:14 PM   *This document has been created with the assistance of dictation software. Please excuse typographical errors. *

## 2024-07-15 NOTE — Progress Notes (Signed)

## 2024-07-15 NOTE — Plan of Care (Signed)
 Problem: Education: Goal: Ability to describe self-care measures that may prevent or decrease complications (Diabetes Survival Skills Education) will improve 07/15/2024 1149 by Jenel Candis HERO, RN Outcome: Progressing 07/15/2024 1145 by Jenel Candis HERO, RN Outcome: Progressing Goal: Individualized Educational Video(s) 07/15/2024 1149 by Jenel Candis HERO, RN Outcome: Progressing 07/15/2024 1145 by Jenel Candis HERO, RN Outcome: Progressing   Problem: Coping: Goal: Ability to adjust to condition or change in health will improve 07/15/2024 1149 by Jenel Candis HERO, RN Outcome: Progressing 07/15/2024 1145 by Jenel Candis HERO, RN Outcome: Progressing   Problem: Fluid Volume: Goal: Ability to maintain a balanced intake and output will improve 07/15/2024 1149 by Jenel Candis HERO, RN Outcome: Progressing 07/15/2024 1145 by Jenel Candis HERO, RN Outcome: Progressing   Problem: Health Behavior/Discharge Planning: Goal: Ability to identify and utilize available resources and services will improve 07/15/2024 1149 by Jenel Candis HERO, RN Outcome: Progressing 07/15/2024 1145 by Jenel Candis HERO, RN Outcome: Progressing Goal: Ability to manage health-related needs will improve 07/15/2024 1149 by Jenel Candis HERO, RN Outcome: Progressing 07/15/2024 1145 by Jenel Candis HERO, RN Outcome: Progressing   Problem: Metabolic: Goal: Ability to maintain appropriate glucose levels will improve 07/15/2024 1149 by Jenel Candis HERO, RN Outcome: Progressing 07/15/2024 1145 by Jenel Candis HERO, RN Outcome: Progressing   Problem: Nutritional: Goal: Maintenance of adequate nutrition will improve 07/15/2024 1149 by Jenel Candis HERO, RN Outcome: Progressing 07/15/2024 1145 by Jenel Candis HERO, RN Outcome: Progressing Goal: Progress toward achieving an optimal weight will improve 07/15/2024 1149 by Jenel Candis HERO, RN Outcome: Progressing 07/15/2024 1145 by Jenel Candis HERO, RN Outcome: Progressing   Problem: Skin Integrity: Goal: Risk for  impaired skin integrity will decrease 07/15/2024 1149 by Jenel Candis HERO, RN Outcome: Progressing 07/15/2024 1145 by Jenel Candis HERO, RN Outcome: Progressing   Problem: Tissue Perfusion: Goal: Adequacy of tissue perfusion will improve 07/15/2024 1149 by Jenel Candis HERO, RN Outcome: Progressing 07/15/2024 1145 by Jenel Candis HERO, RN Outcome: Progressing   Problem: Education: Goal: Knowledge of General Education information will improve Description: Including pain rating scale, medication(s)/side effects and non-pharmacologic comfort measures 07/15/2024 1149 by Jenel Candis HERO, RN Outcome: Progressing 07/15/2024 1145 by Jenel Candis HERO, RN Outcome: Progressing   Problem: Health Behavior/Discharge Planning: Goal: Ability to manage health-related needs will improve 07/15/2024 1149 by Jenel Candis HERO, RN Outcome: Progressing 07/15/2024 1145 by Jenel Candis HERO, RN Outcome: Progressing   Problem: Clinical Measurements: Goal: Ability to maintain clinical measurements within normal limits will improve 07/15/2024 1149 by Jenel Candis HERO, RN Outcome: Progressing 07/15/2024 1145 by Jenel Candis HERO, RN Outcome: Progressing Goal: Will remain free from infection 07/15/2024 1149 by Jenel Candis HERO, RN Outcome: Progressing 07/15/2024 1145 by Jenel Candis HERO, RN Outcome: Progressing Goal: Diagnostic test results will improve 07/15/2024 1149 by Jenel Candis HERO, RN Outcome: Progressing 07/15/2024 1145 by Jenel Candis HERO, RN Outcome: Progressing Goal: Respiratory complications will improve 07/15/2024 1149 by Jenel Candis HERO, RN Outcome: Progressing 07/15/2024 1145 by Jenel Candis HERO, RN Outcome: Progressing Goal: Cardiovascular complication will be avoided 07/15/2024 1149 by Jenel Candis HERO, RN Outcome: Progressing 07/15/2024 1145 by Jenel Candis HERO, RN Outcome: Progressing   Problem: Activity: Goal: Risk for activity intolerance will decrease 07/15/2024 1149 by Jenel Candis HERO, RN Outcome: Progressing 07/15/2024  1145 by Jenel Candis HERO, RN Outcome: Progressing   Problem: Nutrition: Goal: Adequate nutrition will be maintained 07/15/2024 1149 by Jenel Candis HERO, RN Outcome: Progressing 07/15/2024 1145 by Jenel Candis HERO, RN Outcome: Progressing  Problem: Coping: Goal: Level of anxiety will decrease 07/15/2024 1149 by Jenel Candis HERO, RN Outcome: Progressing 07/15/2024 1145 by Jenel Candis HERO, RN Outcome: Progressing   Problem: Elimination: Goal: Will not experience complications related to bowel motility 07/15/2024 1149 by Jenel Candis HERO, RN Outcome: Progressing 07/15/2024 1145 by Jenel Candis HERO, RN Outcome: Progressing Goal: Will not experience complications related to urinary retention 07/15/2024 1149 by Jenel Candis HERO, RN Outcome: Progressing 07/15/2024 1145 by Jenel Candis HERO, RN Outcome: Progressing   Problem: Pain Managment: Goal: General experience of comfort will improve and/or be controlled 07/15/2024 1149 by Jenel Candis HERO, RN Outcome: Progressing 07/15/2024 1145 by Jenel Candis HERO, RN Outcome: Progressing   Problem: Safety: Goal: Ability to remain free from injury will improve 07/15/2024 1149 by Jenel Candis HERO, RN Outcome: Progressing 07/15/2024 1145 by Jenel Candis HERO, RN Outcome: Progressing   Problem: Skin Integrity: Goal: Risk for impaired skin integrity will decrease 07/15/2024 1149 by Jenel Candis HERO, RN Outcome: Progressing 07/15/2024 1145 by Jenel Candis HERO, RN Outcome: Progressing

## 2024-07-15 NOTE — Plan of Care (Signed)

## 2024-07-15 NOTE — Consult Note (Signed)
 Palliative Medicine [facility] at [facility] [phone]) [phone] [phone]) [phone]   Name: Alyssa Leonard Date: 07/15/2024 MRN: [id number]  DOB: 1955-10-30  Patient Care Team: [name] LABOR, MD as PCP - General (Internal Medicine) Darron Deatrice LABOR, MD as PCP - Cardiology (Cardiology) Luke Elsie GRADE, MD as PCP - Hematology/Oncology (Oncology) Kennyth Chew, MD as PCP - Electrophysiology (Cardiology) Melanee Annah BROCKS, MD as Consulting Physician (Oncology) Maurie Rayfield BIRCH, RN as Oncology Nurse Navigator    REASON FOR CONSULTATION: Alyssa Leonard is a 68 y.o. female with multiple medical problems including morbid obesity, OSA on CPAP, a flutter who could not afford Xarelto  and amiodarone , diabetes, and RCC with intra-abdominal metastasis previously on treatment with immunotherapy.  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  She was receiving treatment at [facility] but transferred care.  Patient was admitted to the hospital with AKI and presumed dehydration.  Palliative care consulted to address goals of manage ongoing symptoms.   SOCIAL HISTORY:     reports that she quit smoking about 30 years ago. Her smoking use included cigarettes. She has never used smokeless tobacco. She reports that she does not drink alcohol and does not use drugs.  Patient married and lives at home with her husband.  She has 2 adult daughters who live nearby.  Patient previously worked in a [employer], technical [employer].  ADVANCE DIRECTIVES:  Does not have  CODE STATUS: DNR  PAST MEDICAL HISTORY: Past Medical History:  Diagnosis Date   Asthma    Atrial flutter (HCC)    COPD (chronic obstructive pulmonary disease) (HCC)    Diabetes mellitus without complication (HCC)    GERD (gastroesophageal reflux disease)    History of colon polyps    Hypertension    Meniere disease    OSA (obstructive sleep apnea)    Renal cell carcinoma  (HCC)    Sleep apnea    Went for sleep study test in January 2018 I haven't heard back from test    PAST SURGICAL HISTORY:  Past Surgical History:  Procedure Laterality Date   ABDOMINAL HYSTERECTOMY     BARIATRIC SURGERY     CHOLECYSTECTOMY     COLONOSCOPY WITH PROPOFOL  N/A 12/11/2016   Procedure: COLONOSCOPY WITH PROPOFOL ;  Surgeon: Lamar ONEIDA Holmes, MD;  Location: [facility] ENDOSCOPY;  Service: Endoscopy;  Laterality: N/A;   COLONOSCOPY WITH PROPOFOL  N/A 11/27/2021   Procedure: COLONOSCOPY WITH PROPOFOL ;  Surgeon: Maryruth Ole ONEIDA, MD;  Location: [facility] ENDOSCOPY;  Service: Endoscopy;  Laterality: N/A;   IR CONVERT BILIARY DRAIN TO INT EXT BILIARY DRAIN  06/29/2024   IR EXCHANGE BILIARY DRAIN  06/22/2024   IR INT EXT BILIARY DRAIN WITH CHOLANGIOGRAM  06/09/2024   IR RADIOLOGIST EVAL & MGMT  06/15/2024    HEMATOLOGY/ONCOLOGY HISTORY:  Oncology History   No history exists.    ALLERGIES:  is allergic to latex, aspirin, and plasticized base [plastibase].  MEDICATIONS:  Current Facility-Administered Medications  Medication Dose Route Frequency Provider Last Rate Last Admin   0.9 %  sodium chloride  infusion   Intravenous Continuous Dezii, Alexandra, DO 100 mL/hr at 07/15/24 1431 New Bag at 07/15/24 1431   acetaminophen  (TYLENOL ) tablet 650 mg  650 mg Oral Q6H PRN Cox, Amy N, DO       Or   acetaminophen  (TYLENOL ) suppository 650 mg  650 mg Rectal Q6H PRN Cox, Amy N, DO       albuterol  (PROVENTIL ) (2.5 MG/3ML)  0.083% nebulizer solution 2.5 mg  2.5 mg Nebulization Q6H PRN Dail Rankin RAMAN, RPH       amiodarone  (PACERONE ) tablet 200 mg  200 mg Oral Daily Cox, Amy N, DO   200 mg at 07/15/24 1040   apixaban  (ELIQUIS ) tablet 5 mg  5 mg Oral BID Cox, Amy N, DO   5 mg at 07/15/24 1040   bisacodyl  (DULCOLAX) EC tablet 5 mg  5 mg Oral Daily PRN Cox, Amy N, DO       diazepam  (VALIUM ) tablet 2 mg  2 mg Oral BID PRN Cox, Amy N, DO       docusate sodium  (COLACE) capsule 100 mg  100 mg Oral BID Cox, Amy  N, DO   100 mg at 07/15/24 1040   famotidine  (PEPCID ) tablet 20 mg  20 mg Oral BID BM & HS PRN Dezii, Alexandra, DO   20 mg at 07/14/24 1026   feeding supplement (ENSURE PLUS HIGH PROTEIN) liquid 237 mL  237 mL Oral TID BM Cox, Amy N, DO   237 mL at 07/15/24 1041   insulin  aspart (novoLOG ) injection 0-15 Units  0-15 Units Subcutaneous TID WC Cox, Amy N, DO       insulin  aspart (novoLOG ) injection 0-5 Units  0-5 Units Subcutaneous QHS Cox, Amy N, DO       meclizine  (ANTIVERT ) tablet 25 mg  25 mg Oral TID PRN Cox, Amy N, DO       metoCLOPramide  (REGLAN ) injection 5 mg  5 mg Intravenous Q8H Dezii, Alexandra, DO   5 mg at 07/15/24 1436   metoprolol  tartrate (LOPRESSOR ) tablet 25 mg  25 mg Oral BID Cox, Amy N, DO   25 mg at 07/15/24 1040   mirtazapine  (REMERON ) tablet 7.5 mg  7.5 mg Oral QHS Cox, Amy N, DO   7.5 mg at 07/14/24 2203   ondansetron  (ZOFRAN ) tablet 4 mg  4 mg Oral Q6H PRN Cox, Amy N, DO       Or   ondansetron  (ZOFRAN ) injection 4 mg  4 mg Intravenous Q6H PRN Cox, Amy N, DO       oxyCODONE  (Oxy IR/ROXICODONE ) immediate release tablet 5 mg  5 mg Oral Q8H PRN Cox, Amy N, DO   5 mg at 07/14/24 1850   polyethylene glycol (MIRALAX  / GLYCOLAX ) packet 17 g  17 g Oral BID PRN Cox, Amy N, DO       senna-docusate (Senokot-S) tablet 1 tablet  1 tablet Oral QHS PRN Cox, Amy N, DO       sodium chloride  flush (NS) 0.9 % injection 10-40 mL  10-40 mL Intracatheter Q12H Dezii, Alexandra, DO   10 mL at 07/15/24 1040   sodium chloride  flush (NS) 0.9 % injection 10-40 mL  10-40 mL Intracatheter PRN Dezii, Alexandra, DO        VITAL SIGNS: BP 119/72 (BP Location: Left Arm)   Pulse 73   Temp 98 F (36.7 C) (Oral)   Resp 16   Ht 5' (1.524 m)   Wt 224 lb 13.9 oz (102 kg)   SpO2 100%   BMI 43.92 kg/m  Filed Weights   07/13/24 1612  Weight: 224 lb 13.9 oz (102 kg)    Estimated body mass index is 43.92 kg/m as calculated from the following:   Height as of this encounter: 5' (1.524 m).   Weight as of  this encounter: 224 lb 13.9 oz (102 kg).  LABS: CBC:    Component Value Date/Time  WBC 5.9 07/15/2024 0848   HGB 10.4 (L) 07/15/2024 0848   HGB 12.2 08/15/2023 1147   HCT 31.8 (L) 07/15/2024 0848   PLT 190 07/15/2024 0848   PLT 181 08/15/2023 1147   MCV 84.4 07/15/2024 0848   NEUTROABS 3.3 07/15/2024 0848   LYMPHSABS 1.5 07/15/2024 0848   MONOABS 0.8 07/15/2024 0848   EOSABS 0.3 07/15/2024 0848   BASOSABS 0.0 07/15/2024 0848   Comprehensive Metabolic Panel:    Component Value Date/Time   NA 139 07/15/2024 0848   K 4.5 07/15/2024 0848   CL 108 07/15/2024 0848   CO2 24 07/15/2024 0848   BUN 36 (H) 07/15/2024 0848   CREATININE 2.60 (H) 07/15/2024 0848   CREATININE 1.01 (H) 08/15/2023 1147   GLUCOSE 109 (H) 07/15/2024 0848   CALCIUM  9.1 07/15/2024 0848   AST 53 (H) 07/15/2024 0848   AST 16 08/15/2023 1147   ALT 38 07/15/2024 0848   ALT 12 08/15/2023 1147   ALKPHOS 127 (H) 07/15/2024 0848   BILITOT 1.4 (H) 07/15/2024 0848   BILITOT 0.6 08/15/2023 1147   PROT 6.5 07/15/2024 0848   ALBUMIN 2.7 (L) 07/15/2024 0848    RADIOGRAPHIC STUDIES: DG Abd 2 Views Result Date: 07/01/2024 CLINICAL DATA:  Constipation. EXAM: ABDOMEN - 2 VIEW COMPARISON:  CT 06/08/2024 FINDINGS: No bowel dilatation or evidence of obstruction. Air and small volume of stool throughout nondilated colon. Small volume of stool in the rectum. Multiple surgical clips in the left upper quadrant. A few surgical clips in the pelvis. Right upper quadrant biliary stent. No free intra-abdominal air. IMPRESSION: Small volume formed stool in the colon. No bowel obstruction. Electronically Signed   By: Andrea Gasman M.D.   On: 07/01/2024 00:03   IR CONVERT BILIARY DRAIN TO INT EXT BILIARY DRAIN Result Date: 06/29/2024 INDICATION: Biliary obstruction in a patient with an indwelling biliary drain previously placed 06/09/2024. On 06/22/2024, the patient returned for further evaluation to find that the catheter had partially  retracted internally with a redundant loop of the 10 [place] biliary drain extra capsular to the liver. Despite multiple attempts to gain access across the common bile duct obstruction, the biliary drainage catheter would not track across this obstruction and a drain was left in the common bile duct. The patient returns today for further evaluation and potential new access or converting the catheter to a better positioned internal/external biliary drainage catheter EXAM: Drain exchange COMPARISON:  None Available. ANESTHESIA/SEDATION: Moderate (conscious) sedation was employed during this procedure. A total of Versed  3 mg and Fentanyl  200 mcg was administered intravenously by the radiology nurse. Total intra-service moderate Sedation Time: 30 minutes. The patient's level of consciousness and vital signs were monitored continuously by radiology nursing throughout the procedure under my direct supervision. CONTRAST:  25 mL Omnipaque  300-administered into the collecting system(s) FLUOROSCOPY: Radiation Exposure Index (as provided by the fluoroscopic device): 406 mGy Kerma COMPLICATIONS: None immediate. PROCEDURE: Informed written consent was obtained from the patient after a thorough discussion of the procedural risks, benefits and alternatives. All questions were addressed. Maximal Sterile Barrier Technique was utilized including caps, mask, sterile gowns, sterile gloves, sterile drape, hand hygiene and skin antiseptic. A timeout was performed prior to the initiation of the procedure. In a supine position, the right upper quadrant and external portion of the catheter were prepped and draped in usual sterile fashion. Dilute contrast was injected into the indwelling biliary drain filling the right lobe bile ducts as well as the common bile duct. Common  bile duct is markedly dilated with moderate dilatation of the intrahepatic ducts. Multiple attempts were then made to advance a guidewire through the pigtail catheter and  out through the ampulla into the duodenal without success. The pigtail catheter was cut and removed over a Bentson guidewire. An angled glide catheter was then advanced over the Bentson and attempts at reducing the redundant extra capsular loop were unsuccessful. The catheter was then directed toward the ampulla within the common bile duct and using a combination of Glidewire and Bentson guidewire finally gaining access into the duodenum. Once duodenal access was obtained, contrast was injected verifying intraluminal position within the bowel. The Bentson guidewire was then advanced into the jejunum until enough guidewire was demonstrated to be looping redundantly within the jejunum giving a long length of guidewire position within the small bowel. The glide catheter was then withdrawn. Slow gentle traction was then applied to the guidewire with minimal reduction in the redundant extra capsular loop. Rapid and somewhat forceful traction was then applied to the guidewire and the redundant loop was completely reduced. At this point a 6 French 25 cm sheath was advanced over the guidewire into the common bile duct and directed toward the ampulla. The introducer was then removed and the angled glide catheter was advanced over the guidewire into the jejunum. The guidewire was then removed and exchanged for an Amplatz wire. The sheath and catheter were then completely removed leaving the guidewire in position. An 8 [place] by 40 cm skater biliary drain was advanced over the guidewire and coiled within the duodenal. Contrast was again injected demonstrating final position. Retention suture was applied. Sterile dressing applied. Catheter was connected to gravity drainage. IMPRESSION: Satisfactory reduction of the extra capsular loop and a advancement of the distal tip of the catheter into the bowel completing the internal/external biliary drain placement in a good position for full function. The catheter will remain connected  to gravity drainage. The patient is being admitted for tachycardia, worsening renal function and likely underlying sepsis. IR will continue to monitor bilirubin and drain function. Plan for capping once bilirubin has normalized. The patient had previous difficulty with the internal external drain care and may need extra education and support. Short-term follow-up in the clinic after discharge would likely be beneficial. The patient will be scheduled for a 4 week return for drain change and evaluation as well. Electronically Signed   By: Cordella Banner   On: 06/29/2024 14:14   IR EXCHANGE BILIARY DRAIN Result Date: 06/22/2024 INDICATION: Status post percutaneous biliary drainage procedure with placement of 10 French internal/external biliary drainage catheter via right lobe percutaneous biliary access on 06/09/2024. There is significant leakage of bile around the tube exit site currently. EXAM: EXCHANGE OF BILIARY DRAINAGE CATHETER UNDER FLUOROSCOPY INCLUDING CHOLANGIOGRAM MEDICATIONS: None ANESTHESIA/SEDATION: None FLUOROSCOPY: Radiation Exposure Index (as provided by the fluoroscopic device): 541 mGy Kerma CONTRAST:  25 mL Omnipaque  300 COMPLICATIONS: None immediate. PROCEDURE: Informed written consent was obtained from the patient after a thorough discussion of the procedural risks, benefits and alternatives. All questions were addressed. Maximal Sterile Barrier Technique was utilized including caps, mask, sterile gowns, sterile gloves, sterile drape, hand hygiene and skin antiseptic. A timeout was performed prior to the initiation of the procedure. Initial fluoroscopy was performed of the indwelling biliary drainage catheter. The exiting drain was injected with contrast. The drainage catheter was then removed over a guidewire and a 5 French catheter advanced over the wire. Contrast was injected via the catheter to opacify  the drain tract. The catheter was then advanced over a hydrophilic guidewire into right  intrahepatic bile ducts followed by the common bile duct. The 5 French catheter was further advanced to the level of the duodenum and a guidewire advanced into the duodenum. A 10 French internal/external biliary drainage catheter was attempted to be advanced over various guidewires. Ultimately, a 10 [place] multipurpose drainage catheter was advanced over a guidewire and partially formed. The catheter was injected with contrast to confirm final positioning and attached to a gravity drainage bag. The catheter was secured at the skin exit site with a Prolene retention suture and StatLock device. FINDINGS: Initial fluoroscopy demonstrates severe retraction of the previously placed internal/external biliary drain nearly out of the liver with some drainage side holes located outside of the costal margin. Injection of contrast barely opacifies the liver. A 5 French catheter was placed and contrast injection did opacify a tract back into right intrahepatic bile ducts. There is a persistent redundant and tortuous tract peripheral to bile ducts outside of the liver. 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 Despite access through to the level of the duodenum, a 10 [place] internal/external biliary drain could not be fully advanced into the duodenum due to buckling of guidewire and catheter outside of the liver which prevented the drain to cross the distal CBD stricture. Ultimately, a standard 10 [place] multipurpose drain was advanced with sideholes predominantly in right-sided intrahepatic ducts and the distal portion of the catheter extending just to the origin of the common bile duct. This drain is draining bile and will be left to gravity drainage. As this drain will not be adequate for long-term drainage, the patient will be brought back for either re-attempted conversion to an  internal/external biliary drain or new biliary drain placement via new percutaneous biliary access under moderate IV conscious sedation. IMPRESSION: 1. Previously placed internal/external biliary drainage catheter had retracted nearly out of the liver and bile ducts. 2. Tract back into right intrahepatic bile ducts and across the CBD was not able to be recanalized by a 5 French catheter to the level of the duodenum. 3. A 10 French internal/external biliary drainage catheter could not be advanced into the duodenum due to buckling of guidewire and catheter outside of the liver, per venting the drain to cross the CBD. 4. A standard 10 [place] multipurpose drain was advanced into right-sided intrahepatic ducts and just to the level of the origin of the common bile duct. This will be left to external gravity drainage. 5. The patient will be brought back for re-attempted conversion versus new percutaneous biliary drainage catheter placement under moderate IV conscious sedation. Electronically Signed   By: Marcey Moan M.D.   On: 06/22/2024 15:54   IR Radiologist Eval & Mgmt Result Date: 06/16/2024 EXAM: Please see Epic Note CHIEF COMPLAINT: Please see Epic Note HISTORY OF PRESENT ILLNESS: Please see Epic Note REVIEW OF SYSTEMS: Please see Epic Note PHYSICAL EXAMINATION: Please see Epic Note ASSESSMENT AND PLAN: Please see Epic Note Electronically Signed   By: Cordella Banner   On: 06/16/2024 08:12    PERFORMANCE STATUS (ECOG) : 2 - Symptomatic, <50% confined to bed  Review of Systems Unless otherwise noted, a complete review of systems is negative.  Physical Exam General: NAD Cardiovascular: regular rate and rhythm Pulmonary: clear ant fields Abdomen: soft, nontender, +  bowel sounds GU: no suprapubic tenderness Extremities: no edema, no joint deformities Skin: no rashes Neurological: Weakness but otherwise nonfocal  IMPRESSION: Patient with RCC with intra-abdominal metastasis.  Is on treatment  with immunotherapy.  Now has pancreatic mass with history of obstructive jaundice status post biliary drain placement.  She was pending outpatient ERCP/biopsy.  Patient now admitted with AKI and presumed dehydration.  AKI slowly improving with IV fluids.  Patient reports still with poor oral intake.  She has abdominal pain.  Infrequently receiving oxycodone .  Met with patient and husband to discuss goals.  She recognizes that probably prognosis is poor but remains committed to pursuing biopsy and discussing treatment options.  Plan was for ERCP in outpatient setting.  Patient is interested in trying to coordinate care while she is hospitalized.  Will reach out to IR to explore feasibility of biliary stenting while she is here.  Discussed CODE STATUS.  Patient states clearly and repeatedly that she would not want to be resuscitated or have her life prolonged artificially on machines.  She is in agreement with DNR/DNI.  Husband also verbalized agreement.  PLAN: - Continue current scope of treatment - Plan for outpatient biopsy - Will reach out to IR regarding feasibility of biliary stenting - Continue IV fluids/trending of labs - Maximize use of oxycodone  for pain - Bowel regimen - Outpatient follow-up  Case and plan discussed with Dr. Melanee   Time Total: 45 minutes  Visit consisted of counseling and education dealing with the complex and emotionally intense issues of symptom management and palliative care in the setting of serious and potentially life-threatening illness.Greater than 50%  of this time was spent counseling and coordinating care related to the above assessment and plan.  Signed by: Fonda Mower, PhD, NP-C

## 2024-07-15 NOTE — Care Management Obs Status (Signed)
 MEDICARE OBSERVATION STATUS NOTIFICATION   Patient Details  Name: CINDRA AUSTAD MRN: [id number] Date of Birth: [birth year]   Medicare Observation Status Notification Given:  Yes    Corabelle Spackman W, CMA 07/15/2024, 11:20 AM

## 2024-07-16 ENCOUNTER — Observation Stay: Admitting: Radiology

## 2024-07-16 DIAGNOSIS — N1832 Chronic kidney disease, stage 3b: Secondary | ICD-10-CM | POA: Diagnosis present

## 2024-07-16 DIAGNOSIS — Z6841 Body Mass Index (BMI) 40.0 and over, adult: Secondary | ICD-10-CM | POA: Diagnosis not present

## 2024-07-16 DIAGNOSIS — E1122 Type 2 diabetes mellitus with diabetic chronic kidney disease: Secondary | ICD-10-CM | POA: Diagnosis present

## 2024-07-16 DIAGNOSIS — E66813 Obesity, class 3: Secondary | ICD-10-CM | POA: Diagnosis present

## 2024-07-16 DIAGNOSIS — J4489 Other specified chronic obstructive pulmonary disease: Secondary | ICD-10-CM | POA: Diagnosis present

## 2024-07-16 DIAGNOSIS — C259 Malignant neoplasm of pancreas, unspecified: Secondary | ICD-10-CM | POA: Diagnosis present

## 2024-07-16 DIAGNOSIS — J42 Unspecified chronic bronchitis: Secondary | ICD-10-CM | POA: Diagnosis not present

## 2024-07-16 DIAGNOSIS — D62 Acute posthemorrhagic anemia: Secondary | ICD-10-CM | POA: Diagnosis not present

## 2024-07-16 DIAGNOSIS — E870 Hyperosmolality and hypernatremia: Secondary | ICD-10-CM | POA: Diagnosis present

## 2024-07-16 DIAGNOSIS — N179 Acute kidney failure, unspecified: Secondary | ICD-10-CM | POA: Diagnosis present

## 2024-07-16 DIAGNOSIS — K7460 Unspecified cirrhosis of liver: Secondary | ICD-10-CM | POA: Diagnosis present

## 2024-07-16 DIAGNOSIS — E860 Dehydration: Secondary | ICD-10-CM | POA: Diagnosis present

## 2024-07-16 DIAGNOSIS — C649 Malignant neoplasm of unspecified kidney, except renal pelvis: Secondary | ICD-10-CM | POA: Diagnosis not present

## 2024-07-16 DIAGNOSIS — I480 Paroxysmal atrial fibrillation: Secondary | ICD-10-CM | POA: Diagnosis present

## 2024-07-16 DIAGNOSIS — Z66 Do not resuscitate: Secondary | ICD-10-CM | POA: Diagnosis present

## 2024-07-16 DIAGNOSIS — K219 Gastro-esophageal reflux disease without esophagitis: Secondary | ICD-10-CM | POA: Diagnosis present

## 2024-07-16 DIAGNOSIS — E46 Unspecified protein-calorie malnutrition: Secondary | ICD-10-CM | POA: Diagnosis not present

## 2024-07-16 DIAGNOSIS — D631 Anemia in chronic kidney disease: Secondary | ICD-10-CM | POA: Diagnosis present

## 2024-07-16 DIAGNOSIS — K831 Obstruction of bile duct: Secondary | ICD-10-CM | POA: Diagnosis present

## 2024-07-16 DIAGNOSIS — Z515 Encounter for palliative care: Secondary | ICD-10-CM | POA: Diagnosis not present

## 2024-07-16 DIAGNOSIS — E8721 Acute metabolic acidosis: Secondary | ICD-10-CM | POA: Diagnosis present

## 2024-07-16 DIAGNOSIS — G893 Neoplasm related pain (acute) (chronic): Secondary | ICD-10-CM | POA: Diagnosis present

## 2024-07-16 DIAGNOSIS — R627 Adult failure to thrive: Secondary | ICD-10-CM | POA: Diagnosis present

## 2024-07-16 DIAGNOSIS — I4892 Unspecified atrial flutter: Secondary | ICD-10-CM | POA: Diagnosis present

## 2024-07-16 DIAGNOSIS — I129 Hypertensive chronic kidney disease with stage 1 through stage 4 chronic kidney disease, or unspecified chronic kidney disease: Secondary | ICD-10-CM | POA: Diagnosis present

## 2024-07-16 DIAGNOSIS — G4733 Obstructive sleep apnea (adult) (pediatric): Secondary | ICD-10-CM | POA: Diagnosis present

## 2024-07-16 DIAGNOSIS — I10 Essential (primary) hypertension: Secondary | ICD-10-CM | POA: Diagnosis not present

## 2024-07-16 DIAGNOSIS — C7989 Secondary malignant neoplasm of other specified sites: Secondary | ICD-10-CM | POA: Diagnosis present

## 2024-07-16 DIAGNOSIS — C641 Malignant neoplasm of right kidney, except renal pelvis: Secondary | ICD-10-CM | POA: Diagnosis present

## 2024-07-16 HISTORY — PX: IR BILIARY STENT(S) EXIST ACCESS INC DILAT CATH EXCH/REMOVAL: IMG6048

## 2024-07-16 LAB — CBC WITH DIFFERENTIAL/PLATELET
Abs Immature Granulocytes: 0.03 K/uL (ref 0.00–0.07)
Basophils Absolute: 0 K/uL (ref 0.0–0.1)
Basophils Relative: 1 %
Eosinophils Absolute: 0.3 K/uL (ref 0.0–0.5)
Eosinophils Relative: 5 %
HCT: 30.3 % — ABNORMAL LOW (ref 36.0–46.0)
Hemoglobin: 9.6 g/dL — ABNORMAL LOW (ref 12.0–15.0)
Immature Granulocytes: 1 %
Lymphocytes Relative: 23 %
Lymphs Abs: 1.5 K/uL (ref 0.7–4.0)
MCH: 27.4 pg (ref 26.0–34.0)
MCHC: 31.7 g/dL (ref 30.0–36.0)
MCV: 86.6 fL (ref 80.0–100.0)
Monocytes Absolute: 0.8 K/uL (ref 0.1–1.0)
Monocytes Relative: 12 %
Neutro Abs: 3.9 K/uL (ref 1.7–7.7)
Neutrophils Relative %: 58 %
Platelets: 186 K/uL (ref 150–400)
RBC: 3.5 MIL/uL — ABNORMAL LOW (ref 3.87–5.11)
RDW: 16.8 % — ABNORMAL HIGH (ref 11.5–15.5)
WBC: 6.6 K/uL (ref 4.0–10.5)
nRBC: 0 % (ref 0.0–0.2)

## 2024-07-16 LAB — GLUCOSE, CAPILLARY
Glucose-Capillary: 106 mg/dL — ABNORMAL HIGH (ref 70–99)
Glucose-Capillary: 95 mg/dL (ref 70–99)
Glucose-Capillary: 95 mg/dL (ref 70–99)
Glucose-Capillary: 88 mg/dL (ref 70–99)
Glucose-Capillary: 179 mg/dL — ABNORMAL HIGH (ref 70–99)

## 2024-07-16 LAB — COMPREHENSIVE METABOLIC PANEL WITH GFR
ALT: 31 U/L (ref 0–44)
AST: 39 U/L (ref 15–41)
Albumin: 2.6 g/dL — ABNORMAL LOW (ref 3.5–5.0)
Alkaline Phosphatase: 127 U/L — ABNORMAL HIGH (ref 38–126)
Anion gap: 9 (ref 5–15)
BUN: 36 mg/dL — ABNORMAL HIGH (ref 8–23)
CO2: 19 mmol/L — ABNORMAL LOW (ref 22–32)
Calcium: 8.7 mg/dL — ABNORMAL LOW (ref 8.9–10.3)
Chloride: 112 mmol/L — ABNORMAL HIGH (ref 98–111)
Creatinine, Ser: 2.31 mg/dL — ABNORMAL HIGH (ref 0.44–1.00)
GFR, Estimated: 22 mL/min — ABNORMAL LOW (ref >60)
Glucose, Bld: 117 mg/dL — ABNORMAL HIGH (ref 70–99)
Potassium: 4.1 mmol/L (ref 3.5–5.1)
Sodium: 140 mmol/L (ref 135–145)
Total Bilirubin: 1 mg/dL (ref 0.0–1.2)
Total Protein: 6 g/dL — ABNORMAL LOW (ref 6.5–8.1)

## 2024-07-16 LAB — MAGNESIUM: Magnesium: 2 mg/dL (ref 1.7–2.4)

## 2024-07-16 LAB — PROTIME-INR
INR: 3 — ABNORMAL HIGH (ref 0.8–1.2)
Prothrombin Time: 32.8 s — ABNORMAL HIGH (ref 11.4–15.2)

## 2024-07-16 LAB — PHOSPHORUS: Phosphorus: 2.8 mg/dL (ref 2.5–4.6)

## 2024-07-16 MED ORDER — IOHEXOL 300 MG/ML  SOLN
50.0000 mL | Freq: Once | INTRAMUSCULAR | Status: AC | PRN
  Administered 2024-07-16: 40 mL

## 2024-07-16 MED ORDER — SODIUM CHLORIDE 0.9 % IV SOLN
2.0000 g | Freq: Once | INTRAVENOUS | Status: AC
  Administered 2024-07-16: 2 g via INTRAVENOUS
  Filled 2024-07-16: qty 2

## 2024-07-16 MED ORDER — FENTANYL CITRATE (PF) 100 MCG/2ML IJ SOLN
INTRAMUSCULAR | Status: AC
  Filled 2024-07-16: qty 2

## 2024-07-16 MED ORDER — LIDOCAINE HCL 1 % IJ SOLN
INTRAMUSCULAR | Status: AC
  Filled 2024-07-16: qty 20

## 2024-07-16 MED ORDER — MIDAZOLAM HCL 2 MG/2ML IJ SOLN
INTRAMUSCULAR | Status: AC
  Filled 2024-07-16: qty 2

## 2024-07-16 MED ORDER — HYDROMORPHONE HCL 1 MG/ML IJ SOLN
1.0000 mg | Freq: Once | INTRAMUSCULAR | Status: AC
  Administered 2024-07-16: 1 mg via INTRAVENOUS

## 2024-07-16 MED ORDER — LACTULOSE 10 GM/15ML PO SOLN
10.0000 g | Freq: Three times a day (TID) | ORAL | Status: DC
  Administered 2024-07-16 – 2024-07-23 (×6): 10 g via ORAL
  Filled 2024-07-16 (×12): qty 30

## 2024-07-16 MED ORDER — MIDAZOLAM HCL 2 MG/2ML IJ SOLN
INTRAMUSCULAR | Status: AC | PRN
  Administered 2024-07-16 (×3): 1 mg via INTRAVENOUS

## 2024-07-16 MED ORDER — HYDROMORPHONE HCL 1 MG/ML IJ SOLN
INTRAMUSCULAR | Status: AC
  Filled 2024-07-16: qty 1

## 2024-07-16 MED ORDER — LIDOCAINE HCL 1 % IJ SOLN
10.0000 mL | Freq: Once | INTRAMUSCULAR | Status: DC
  Filled 2024-07-16: qty 10

## 2024-07-16 MED ORDER — FENTANYL CITRATE (PF) 100 MCG/2ML IJ SOLN
INTRAMUSCULAR | Status: AC | PRN
  Administered 2024-07-16: 50 ug via INTRAVENOUS
  Administered 2024-07-16 (×3): 25 ug via INTRAVENOUS

## 2024-07-16 NOTE — Consult Note (Signed)
 Chief Complaint: Patient was seen in consultation today for  Chief Complaint  Patient presents with   abnormal labs   Referring Physician(s): Fonda Mower NP  Supervising Physician: [name] Aran  Patient Status: [facility] - In-pt  History of Present Illness: Alyssa Leonard is a 68 y.o. female  with a history of obesity s/p roux-en-Y and metastatic renal cell carcinoma who presented to the ED in mid-August with complaints of abdominal pain. Found to have obstructive jaundice secondary to intra-abdominal metastases and underwent transhepatic cholangiogram with percutaneous biliary drain placement on 8/13 with Dr. Karalee. At that time the drain was able to be internalized with the catheter in the duodenum. The drain was capped prior to patient discharge and she was instructed to uncap the drain and reach out to IR if she developed any new/worsening symptoms.    About 4 days following discharge, patient called IR with complaints of moderate RUQ/Epigastric pain and stated she did not have an additional bag to attach to the drain. She presented IR for evaluation. At the time the drain flushed easily and was connected to a gravity bag that immediately started draining bile. She was scheduled to return the following week for drain evaluation.   On 8/26 she returned for drain evaluation with Dr. [name]. Unfortunately the drain was almost completely retracted out of the liver and bile ducts. A new drain was able to be advanced into the right intrahepatic ducts and proximal CBD, but was unable to be advanced into the duodenum. She returned to IR 06/29/24 and the drain was able to be advanced into proper position within the bowel.   She presented to the ED 07/13/24 with abnormal labs secondary to dehydration/AKI. She was admitted for treatment and IR was requested to attempt biliary stent placement. Imaging reviewed and procedure approved by Dr. [name].    Past Medical History:  Diagnosis  Date   Asthma    Atrial flutter (HCC)    COPD (chronic obstructive pulmonary disease) (HCC)    Diabetes mellitus without complication (HCC)    GERD (gastroesophageal reflux disease)    History of colon polyps    Hypertension    Meniere disease    OSA (obstructive sleep apnea)    Renal cell carcinoma (HCC)    Sleep apnea    Went for sleep study test in January 2018 I haven't heard back from test    Past Surgical History:  Procedure Laterality Date   ABDOMINAL HYSTERECTOMY     BARIATRIC SURGERY     CHOLECYSTECTOMY     COLONOSCOPY WITH PROPOFOL  N/A 12/11/2016   Procedure: COLONOSCOPY WITH PROPOFOL ;  Surgeon: Lamar ONEIDA Holmes, MD;  Location: [facility] ENDOSCOPY;  Service: Endoscopy;  Laterality: N/A;   COLONOSCOPY WITH PROPOFOL  N/A 11/27/2021   Procedure: COLONOSCOPY WITH PROPOFOL ;  Surgeon: Maryruth Ole ONEIDA, MD;  Location: [facility] ENDOSCOPY;  Service: Endoscopy;  Laterality: N/A;   IR CONVERT BILIARY DRAIN TO INT EXT BILIARY DRAIN  06/29/2024   IR EXCHANGE BILIARY DRAIN  06/22/2024   IR INT EXT BILIARY DRAIN WITH CHOLANGIOGRAM  06/09/2024   IR RADIOLOGIST EVAL & MGMT  06/15/2024    Allergies: Latex, Aspirin, and Plasticized base [plastibase]  Medications: Prior to Admission medications   Medication Sig Start Date End Date Taking? Authorizing Provider  acetaminophen  (TYLENOL ) 500 MG tablet Take 1,000 mg by mouth every 6 (six) hours as needed for mild pain (pain score 1-3).   Yes [provider]  albuterol  (PROVENTIL  HFA;VENTOLIN  HFA) 108 (90 Base)  MCG/ACT inhaler Inhale 2 puffs into the lungs every 6 (six) hours as needed for wheezing or shortness of breath.   Yes [provider]  albuterol  (PROVENTIL ) (2.5 MG/3ML) 0.083% nebulizer solution Take 2.5 mg by nebulization every 4 (four) hours as needed for wheezing or shortness of breath. 04/05/24  Yes [provider]  amiodarone  (PACERONE ) 200 MG tablet Take 1 tablet (200 mg total) by mouth daily. 04/20/24  Yes Riddle,  Suzann, NP  diazepam  (VALIUM ) 2 MG tablet Take 1 tablet (2 mg total) by mouth 2 (two) times daily as needed for anxiety. Home med. 04/01/24  Yes Awanda City, MD  dibucaine (NUPERCAINAL) 1 % OINT Place 1 Application rectally as needed for hemorrhoids.   Yes [provider]  dorzolamide  (TRUSOPT ) 2 % ophthalmic solution Place 1 drop into both eyes 2 (two) times daily.   Yes [provider]  famotidine  (PEPCID ) 40 MG tablet Take 40 mg by mouth at bedtime. 02/12/24  Yes [provider]  HYDROmorphone  (DILAUDID ) 2 MG tablet Take 2 mg by mouth every 6 (six) hours as needed for moderate pain (pain score 4-6). 06/25/24 06/25/25 Yes [provider]  latanoprost  (XALATAN ) 0.005 % ophthalmic solution 1 drop at bedtime.   Yes [provider]  loperamide  (IMODIUM ) 1 MG/5ML solution Take 1 mg by mouth daily as needed for diarrhea or loose stools.   Yes [provider]  losartan -hydrochlorothiazide  (HYZAAR) 50-12.5 MG tablet Take 1 tablet by mouth daily.   Yes [provider]  meclizine  (ANTIVERT ) 25 MG tablet Take 25 mg by mouth 3 (three) times daily as needed for dizziness.   Yes [provider]  metoprolol  tartrate (LOPRESSOR ) 25 MG tablet Take 0.5 tablets (12.5 mg total) by mouth 2 (two) times daily. 04/01/24  Yes Awanda City, MD  omeprazole 20 MG TBDD disintegrating tablet 20 mg 2 (two) times daily before a meal.   Yes [provider]  ondansetron  (ZOFRAN ) 4 MG tablet Take 4 mg by mouth every 8 (eight) hours as needed for nausea or vomiting.   Yes [provider]  oxyCODONE  (OXY IR/ROXICODONE ) 5 MG immediate release tablet Take 1 tablet (5 mg total) by mouth every 8 (eight) hours as needed for moderate pain (pain score 4-6). 07/13/24  Yes Borders, Fonda SAUNDERS, NP  potassium chloride  (KLOR-CON ) 10 MEQ tablet Take 10 mEq by mouth daily. 04/09/23  Yes [provider]  sodium chloride  flush 0.9 % SOLN injection Flush biliary cathter  with 5-10mL saline daily to prevent clogging 06/11/24  Yes Wouk, Devaughn Sayres, MD  Testosterone 12.5 MG/ACT (1%) GEL Apply 12.5 mg topically daily. 01/05/24  Yes [provider]  apixaban  (ELIQUIS ) 5 MG TABS tablet Take 1 tablet (5 mg total) by mouth 2 (two) times daily. Patient not taking: Reported on 07/13/2024 07/01/24   Patel, Sona, MD  feeding supplement (ENSURE PLUS HIGH PROTEIN) LIQD Take 237 mLs by mouth 3 (three) times daily between meals. Patient not taking: Reported on 07/13/2024 07/01/24   Patel, Sona, MD  losartan  (COZAAR ) 25 MG tablet Take 1 tablet (25 mg total) by mouth daily. Patient not taking: Reported on 07/13/2024 04/01/24   Awanda City, MD  mirtazapine  (REMERON ) 7.5 MG tablet Take 1 tablet (7.5 mg total) by mouth at bedtime. Patient not taking: Reported on 07/13/2024 07/13/24   Borders, Fonda SAUNDERS, NP     Family History  Problem Relation Age of Onset   Hypertension Mother    Kidney disease Sister  Kidney disease Brother    Heart disease Brother    Heart disease Brother    Breast cancer Neg Hx     Social History   Socioeconomic History   Marital status: Married    Spouse name: Not on file   Number of children: Not on file   Years of education: Not on file   Highest education level: Not on file  Occupational History   Not on file  Tobacco Use   Smoking status: Former    Current packs/day: 0.00    Types: Cigarettes    Quit date: 11/14/1993    Years since quitting: 30.6   Smokeless tobacco: Never  Vaping Use   Vaping status: Never Used  Substance and Sexual Activity   Alcohol use: No   Drug use: No   Sexual activity: Not Currently  Other Topics Concern   Not on file  Social History Narrative   Not on file   Social Drivers of Health   Financial Resource Strain: Low Risk  (06/01/2024)   Received from [facility]   Overall Financial Resource Strain (CARDIA)    Difficulty of Paying Living Expenses: Not hard at all  Food Insecurity: No Food  Insecurity (07/14/2024)   Hunger Vital Sign    Worried About Running Out of Food in the Last Year: Never true    Ran Out of Food in the Last Year: Never true  Transportation Needs: No Transportation Needs (07/14/2024)   PRAPARE - Administrator, Civil Service (Medical): No    Lack of Transportation (Non-Medical): No  Physical Activity: Not on file  Stress: Not on file  Social Connections: Socially Integrated (07/14/2024)   Social Connection and Isolation Panel    Frequency of Communication with Friends and Family: More than three times a week    Frequency of Social Gatherings with Friends and Family: More than three times a week    Attends Religious Services: 1 to 4 times per year    Active Member of [employer] or Organizations: No    Attends Engineer, structural: More than 4 times per year    Marital Status: Married    Review of Systems: A 12 point ROS discussed and pertinent positives are indicated in the HPI above.  All other systems are negative.  Review of Systems  All other systems reviewed and are negative.   Vital Signs: BP 111/71   Pulse 67   Temp 97.6 F (36.4 C) (Oral)   Resp 16   Ht 5' (1.524 m)   Wt 224 lb 13.9 oz (102 kg)   SpO2 100%   BMI 43.92 kg/m   Physical Exam Constitutional:      General: She is not in acute distress.    Appearance: She is obese. She is not ill-appearing.  HENT:     Mouth/Throat:     Mouth: Mucous membranes are moist.     Pharynx: Oropharynx is clear.  Cardiovascular:     Rate and Rhythm: Normal rate.  Pulmonary:     Effort: Pulmonary effort is normal.  Abdominal:     Tenderness: There is no abdominal tenderness.     Comments: RUQ drain to gravity. Dressing is clean/dry. Approximately 25 ml of bile in gravity bag. Site is non-tender to palpation.   Skin:    General: Skin is warm and dry.  Neurological:     Mental Status: She is alert and oriented to person, place, and time.  Imaging:   Labs:  CBC: Recent Labs    07/13/24 2041 07/14/24 0323 07/15/24 0848 07/16/24 0543  WBC 8.4 7.5 5.9 6.6  HGB 11.8* 10.0* 10.4* 9.6*  HCT 36.6 30.7* 31.8* 30.3*  PLT 231 182 190 186    COAGS: Recent Labs    03/27/24 2104 06/09/24 1256 06/29/24 1141 06/29/24 1639 06/30/24 0337 07/16/24 0543  INR  --    < > 1.5* 1.4* 4.0* 3.0*  APTT 33  --   --  23*  --   --    < > = values in this interval not displayed.    BMP: Recent Labs    07/13/24 2041 07/14/24 0323 07/15/24 0848 07/16/24 0543  NA 137 139 139 140  K 4.7 4.7 4.5 4.1  CL 103 107 108 112*  CO2 21* 22 24 19*  GLUCOSE 139* 96 109* 117*  BUN 43* 40* 36* 36*  CALCIUM  9.7 8.9 9.1 8.7*  CREATININE 3.05* 2.79* 2.60* 2.31*  GFRNONAA 16* 18* 19* 22*    LIVER FUNCTION TESTS: Recent Labs    07/13/24 1042 07/13/24 2041 07/15/24 0848 07/16/24 0543  BILITOT 1.8* 1.6* 1.4* 1.0  AST 59* 64* 53* 39  ALT 43 45* 38 31  ALKPHOS 164* 149* 127* 127*  PROT 8.0 7.7 6.5 6.0*  ALBUMIN 3.7 3.3* 2.7* 2.6*    TUMOR MARKERS: No results for input(s): AFPTM, CEA, CA199, CHROMGRNA in the last 8760 hours.  Assessment and Plan:  Pancreatic cancer with obstructive jaundice s/p internal/external biliary drain placement in IR: Alyssa Leonard, 68 year old female, is scheduled today for an image-guided biliary stent placement.   Risks and benefits of this procedure were with the patient including, but not limited to bleeding, infection which may lead to sepsis or even death and damage to adjacent structures.  All of the patient's questions were answered, patient is agreeable to proceed.  Consent signed and in IR. She has been NPO. Last dose of Eliquis  was last night.   Thank you for this interesting consult.  I greatly enjoyed meeting Alyssa Leonard and look forward to participating in their care.  A copy of this report was sent to the requesting provider on this date.  Electronically Signed: Warren Dais, AGACNP-BC 07/16/2024, 9:35 AM   I spent a total of 20 Minutes    in face to face in clinical consultation, greater than 50% of which was counseling/coordinating care for pancreatic cancer.

## 2024-07-16 NOTE — Procedures (Signed)
 Interventional Radiology Procedure Note  Procedure: Cholangiogram through biliary drain; common bile duct stent placement with covered metallic stent  Complications: None  Estimated Blood Loss: < 5 mL  Findings: Cholangiogram demonstrates persistent stricture and obstruction of distal CBD extending to ampulla. Treated with deployment of 10 mm x 60 mm Wallflex, covered self-expanding metallic biliary stent.  Stent pos dilated with 8 mm balloon to treat stricture with improved biliary patency. Good drainage through stent on completion and internal/external biliary stent removed on completion.  Alyssa Leonard. [name], M.D Pager:  [phone]

## 2024-07-16 NOTE — Plan of Care (Signed)

## 2024-07-16 NOTE — Progress Notes (Signed)
 PROGRESS NOTE    Alyssa Leonard  FMW:[id number] DOB: 08-22-1956 DOA: 07/13/2024 PCP: [name] LABOR, MD  Chief Complaint  Patient presents with   abnormal labs    Hospital Course:  Alyssa Leonard 68 year old female with history of renal cell carcinoma, obstructive jaundice secondary to malignant neoplasm of the pancreas, paroxysmal A-fib on Eliquis , hypertension, OSA, GERD, morbid obesity, who presents to the ED with AKI.  Patient reports that she has had poor p.o. intake since cancer diagnosis.  She also endorses recurrent nausea.  She has recently met with oncology and palliative care and is planning for biopsy of the pancreatic mass.  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  Expanding metallic biliary stent was placed with improved biliary patency, external biliary stent was removed.  Subjective: No acute events overnight.  Still experiencing some nausea.  Patient was evaluated prior to procedure today.  Objective: Vitals:   07/16/24 1528 07/16/24 1545 07/16/24 1600 07/16/24 1614  BP: (!) 146/85 133/73 126/78   Pulse: 67 67 69   Resp: 10 13 13    Temp: (!) 97.5 F (36.4 C)   (!) 97.5 F (36.4 C)  TempSrc:      SpO2: 97% 96% 97%   Weight:      Height:        Intake/Output Summary (Last 24 hours) at 07/16/2024 1636 Last data filed at 07/16/2024 0630 Gross per 24 hour  Intake 1187.18 ml  Output 240 ml  Net 947.18 ml   Filed Weights   07/13/24 1612  Weight: 102 kg    Examination: General exam: Appears calm and comfortable, NAD  Respiratory system: No work of breathing, symmetric chest wall expansion Cardiovascular system: S1 & S2 heard, RRR.  Gastrointestinal system: Abdomen is nondistended, soft, tender to palpation in epigastrium and right upper quadrant Psychiatry: Mood & affect appropriate for situation.   Assessment & Plan:  Principal Problem:   AKI (acute kidney  injury) (HCC) Active Problems:   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)   Mass of head of pancreas   COPD (chronic obstructive pulmonary disease) (HCC)   OSA (obstructive sleep apnea)   Paroxysmal atrial flutter (HCC)   Hypertension   Diabetes mellitus without complication (HCC)   GERD (gastroesophageal reflux disease)   Neoplasm related pain   Palliative care encounter   AKI - Suspect this is prerenal secondary to poor p.o. intake, certainly complicated by massive renal mass - Baseline creatinine 1 month ago 0.76 - Continue with IV fluids until p.o. intake picks up - Continue to renally dosed with a creatinine clearance of 25 when needed - Continue to trend CMP, creatinine continues to improve - Avoid nephrotoxic medications  Nausea Abdominal fullness - Secondary to pancreatic mass and large renal mass - Scheduled Reglan  appears to be helping some.  Continue - Start full liquid diet after biliary stent placement today.  Advance as tolerated. - As needed Zofran   - Palliative care consulted.  Malignant pancreatic mass - Presumed primary cause of all of the above - CT scan from 8/12 reviewed: Right renal mass 11 x 9 x 9 cm, abutting the adjacent duodenum.  Mass of pancreatic head 3 x 5 cm. - Patient has recently established with oncology.  She is planning for outpatient biopsy - CA 19-9 has resulted and is very elevated. - Palliative care consulted, at this time prognosis appears unfortunately poor - 9/19: Cholangiogram through biliary drain,,  bile duct stent placement with covered metallic stent. - Monitor LFTs  History renal cell carcinoma - Very large renal mass causing compression and upward displacement. - Needs outpatient follow-up with oncology   Paroxysmal A-fib - Continue Eliquis   OSA - Continue CPAP at night  COPD, no acute exacerbation - As needed home meds  DVT prophylaxis: Eliquis    Code Status: Limited: Do not attempt resuscitation (DNR)  -DNR-LIMITED -Do Not Intubate/DNI  Disposition:  Inpatient until clinical improvement  Consultants:    Procedures:    Antimicrobials:  Anti-infectives (From admission, onward)    Start     Dose/Rate Route Frequency Ordered Stop   07/16/24 1515  cefOXitin  (MEFOXIN ) 2 g in sodium chloride  0.9 % 100 mL IVPB        2 g 200 mL/hr over 30 Minutes Intravenous  Once 07/16/24 1422 07/16/24 1513       Data Reviewed: I have personally reviewed following labs and imaging studies CBC: Recent Labs  Lab 07/13/24 1042 07/13/24 2041 07/14/24 0323 07/15/24 0848 07/16/24 0543  WBC 8.2 8.4 7.5 5.9 6.6  NEUTROABS 5.8 5.5  --  3.3 3.9  HGB 12.4 11.8* 10.0* 10.4* 9.6*  HCT 38.4 36.6 30.7* 31.8* 30.3*  MCV 85.9 85.7 86.2 84.4 86.6  PLT 257 231 182 190 186   Basic Metabolic Panel: Recent Labs  Lab 07/13/24 1042 07/13/24 2041 07/14/24 0323 07/15/24 0848 07/16/24 0543  NA 135 137 139 139 140  K 4.9 4.7 4.7 4.5 4.1  CL 102 103 107 108 112*  CO2 20* 21* 22 24 19*  GLUCOSE 240* 139* 96 109* 117*  BUN 41* 43* 40* 36* 36*  CREATININE 3.06* 3.05* 2.79* 2.60* 2.31*  CALCIUM  9.9 9.7 8.9 9.1 8.7*  MG  --   --   --   --  2.0  PHOS  --   --   --   --  2.8   GFR: Estimated Creatinine Clearance: 25.1 mL/min (A) (by C-G formula based on SCr of 2.31 mg/dL (H)). Liver Function Tests: Recent Labs  Lab 07/13/24 1042 07/13/24 2041 07/15/24 0848 07/16/24 0543  AST 59* 64* 53* 39  ALT 43 45* 38 31  ALKPHOS 164* 149* 127* 127*  BILITOT 1.8* 1.6* 1.4* 1.0  PROT 8.0 7.7 6.5 6.0*  ALBUMIN 3.7 3.3* 2.7* 2.6*   CBG: Recent Labs  Lab 07/15/24 1756 07/15/24 2044 07/16/24 0736 07/16/24 1126 07/16/24 1334  GLUCAP 120* 137* 106* 95 95    No results found for this or any previous visit (from the past 240 hours).   Radiology Studies: IR BILIARY STENT(S) EXIST ACCESS INC DILAT CATH EXCH/REMOVAL Result Date: 07/16/2024 INDICATION: Common bile duct obstruction secondary to pancreatic mass and  status post previous placement of internal/external biliary drain. Bilirubin has now normalized and the patient presents for possible internalization of biliary stenting via percutaneous approach. EXAM: BILIARY STENT PLACEMENT VIA PRE-EXISTING INTERNAL/EXTERNAL DRAIN APPROACH INCLUDING CHOLANGIOGRAM MEDICATIONS: 2 g IV cefoxitin ; The antibiotic was administered within an appropriate time frame prior to the initiation of the procedure. ANESTHESIA/SEDATION: Moderate (conscious) sedation was employed during this procedure. A total of Versed  3.0 mg and Fentanyl  125 mcg was administered intravenously by the radiology nurse. Total intra-service moderate Sedation Time: 51 minutes. The patient's level of consciousness and vital signs were monitored continuously by radiology nursing throughout the procedure under my direct supervision. FLUOROSCOPY: Radiation Exposure Index (as provided by the fluoroscopic device): 469 mGy Kerma COMPLICATIONS: None immediate. PROCEDURE: Informed written consent was obtained  from the patient after a thorough discussion of the procedural risks, benefits and alternatives. All questions were addressed. Maximal Sterile Barrier Technique was utilized including caps, mask, sterile gowns, sterile gloves, sterile drape, hand hygiene and skin antiseptic. A timeout was performed prior to the initiation of the procedure. Initial cholangiogram was performed through the pre-existing internal/external biliary drainage catheter. A radiopaque stent guided ruler had been placed along the patient's back in order to guide measurement assessment for stent placement. The drain was then cut and removed over a guidewire. A 7 French sheath was advanced over the wire to the level of the duodenum. Additional contrast and air was injected to outline the duodenal lumen. The sheath was retracted across the common bile duct in order to define length of common bile duct stricture/obstruction. A 10 mm x 60 mm Wallflex  covered biliary stent prosthesis was chosen for placement. The covered stent deployment system was advanced over a guidewire to the level of the duodenum and retracted into appropriate position. The stent was then deployed across the common bile duct. After stent deployment, the stent lumen was additionally dilated with an 8 mm x 40 mm [employer] balloon. Balloon dilatation was performed in 2 different segments in order to cover the stented segment. The balloon was deflated and removed. Additional cholangiogram was then performed through the 7 French sheath positioned at the confluence of right and left intrahepatic bile ducts as well as in the right intrahepatic bile ducts. Percutaneous right-sided biliary access was then removed with removal of guidewire and sheath. A gauze dressing was applied over the percutaneous access site. 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 Intrahepatic bile ducts are relatively decompressed. Based on length of estimated stricture, a 10 mm x 60 mm covered self expanding biliary stent was chosen for placement. After stent deployment, due to persistent narrowing of the stent in its midportion, the stent was further post dilated to 8 mm resulting in improved patency. The stent is well positioned extending from just below the biliary confluence of right and left intrahepatic ducts to the level of the duodenum. Excellent antegrade flow is documented through the stent after placement which allowed complete removal of percutaneous access on completion. 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 The covered stent was additionally post dilated with an 8 mm balloon and demonstrates excellent patency extending from just below the  biliary confluence to the duodenum with widely patent flow demonstrated after stent placement. Percutaneous biliary access was removed upon completion of the procedure. Electronically Signed   By: Marcey Moan M.D.   On: 07/16/2024 16:02    Scheduled Meds:  amiodarone   200 mg Oral Daily   apixaban   5 mg Oral BID   docusate sodium   100 mg Oral BID   feeding supplement  237 mL Oral TID BM   insulin  aspart  0-15 Units Subcutaneous TID WC   insulin  aspart  0-5 Units Subcutaneous QHS   lactulose   10 g Oral TID   lidocaine   10 mL Intradermal Once   metoCLOPramide  (REGLAN ) injection  5 mg Intravenous Q8H   metoprolol  tartrate  25 mg Oral BID   mirtazapine   7.5 mg Oral QHS   sodium chloride  flush  10-40 mL Intracatheter Q12H   Continuous Infusions:     LOS: 0  days  MDM: Patient is high risk for one or more organ failure.  They necessitate ongoing hospitalization for continued IV therapies and subsequent lab monitoring. Total time spent interpreting labs and vitals, reviewing the medical record, coordinating care amongst consultants and care team members, directly assessing and discussing care with the patient and/or family: 55 min  Heather Mckendree, DO [facility]  To contact the attending physician between 7A-7P please use Epic Chat. To contact the covering physician during after hours 7P-7A, please review Amion.  07/16/2024, 4:36 PM   *This document has been created with the assistance of dictation software. Please excuse typographical errors. *

## 2024-07-17 DIAGNOSIS — N179 Acute kidney failure, unspecified: Secondary | ICD-10-CM | POA: Diagnosis not present

## 2024-07-17 LAB — COMPREHENSIVE METABOLIC PANEL WITH GFR
ALT: 26 U/L (ref 0–44)
AST: 28 U/L (ref 15–41)
Albumin: 2.5 g/dL — ABNORMAL LOW (ref 3.5–5.0)
Alkaline Phosphatase: 126 U/L (ref 38–126)
Anion gap: 9 (ref 5–15)
BUN: 33 mg/dL — ABNORMAL HIGH (ref 8–23)
CO2: 22 mmol/L (ref 22–32)
Calcium: 8.7 mg/dL — ABNORMAL LOW (ref 8.9–10.3)
Chloride: 112 mmol/L — ABNORMAL HIGH (ref 98–111)
Creatinine, Ser: 2.44 mg/dL — ABNORMAL HIGH (ref 0.44–1.00)
GFR, Estimated: 21 mL/min — ABNORMAL LOW (ref >60)
Glucose, Bld: 109 mg/dL — ABNORMAL HIGH (ref 70–99)
Potassium: 4.1 mmol/L (ref 3.5–5.1)
Sodium: 143 mmol/L (ref 135–145)
Total Bilirubin: 0.9 mg/dL (ref 0.0–1.2)
Total Protein: 6 g/dL — ABNORMAL LOW (ref 6.5–8.1)

## 2024-07-17 LAB — CBC WITH DIFFERENTIAL/PLATELET
Abs Immature Granulocytes: 0.02 K/uL (ref 0.00–0.07)
Basophils Absolute: 0 K/uL (ref 0.0–0.1)
Basophils Relative: 0 %
Eosinophils Absolute: 0.2 K/uL (ref 0.0–0.5)
Eosinophils Relative: 3 %
HCT: 30.7 % — ABNORMAL LOW (ref 36.0–46.0)
Hemoglobin: 10 g/dL — ABNORMAL LOW (ref 12.0–15.0)
Immature Granulocytes: 0 %
Lymphocytes Relative: 19 %
Lymphs Abs: 1.3 K/uL (ref 0.7–4.0)
MCH: 27.7 pg (ref 26.0–34.0)
MCHC: 32.6 g/dL (ref 30.0–36.0)
MCV: 85 fL (ref 80.0–100.0)
Monocytes Absolute: 0.9 K/uL (ref 0.1–1.0)
Monocytes Relative: 12 %
Neutro Abs: 4.5 K/uL (ref 1.7–7.7)
Neutrophils Relative %: 66 %
Platelets: 181 K/uL (ref 150–400)
RBC: 3.61 MIL/uL — ABNORMAL LOW (ref 3.87–5.11)
RDW: 16.8 % — ABNORMAL HIGH (ref 11.5–15.5)
WBC: 6.9 K/uL (ref 4.0–10.5)
nRBC: 0 % (ref 0.0–0.2)

## 2024-07-17 LAB — GLUCOSE, CAPILLARY
Glucose-Capillary: 111 mg/dL — ABNORMAL HIGH (ref 70–99)
Glucose-Capillary: 121 mg/dL — ABNORMAL HIGH (ref 70–99)
Glucose-Capillary: 123 mg/dL — ABNORMAL HIGH (ref 70–99)
Glucose-Capillary: 120 mg/dL — ABNORMAL HIGH (ref 70–99)
Glucose-Capillary: 122 mg/dL — ABNORMAL HIGH (ref 70–99)

## 2024-07-17 LAB — PHOSPHORUS: Phosphorus: 3.3 mg/dL (ref 2.5–4.6)

## 2024-07-17 LAB — MAGNESIUM: Magnesium: 2 mg/dL (ref 1.7–2.4)

## 2024-07-17 NOTE — Progress Notes (Signed)
 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  Patient reports that she has had poor p.o. intake since cancer diagnosis.  She also endorses recurrent nausea.  She has recently met with oncology and palliative care and is planning for biopsy of the pancreatic mass.  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  Expanding metallic biliary stent was placed with improved biliary patency, external biliary stent was removed.  Subjective: No acute events overnight.  Patient is very grateful to have her exterior drain removed.  No vomiting, nausea appears to be improving.  Tolerating small amounts of food.  Objective: Vitals:   07/16/24 1943 07/17/24 0050 07/17/24 0355 07/17/24 0807  BP: (!) 147/82 130/70 111/70 116/74  Pulse: 95 70 69 73  Resp: 16 14 14 17   Temp: 98.2 F (36.8 C) 98.3 F (36.8 C) 97.9 F (36.6 C) 98.2 F (36.8 C)  TempSrc:    Oral  SpO2: 99% 100% 100% 100%  Weight:      Height:       No intake or output data in the 24 hours ending 07/17/24 1413  Filed Weights   07/13/24 1612  Weight: 102 kg    Examination: General exam: Appears calm and comfortable, NAD  Respiratory system: No work of breathing, symmetric chest wall expansion Cardiovascular system: S1 & S2 heard, RRR.  Gastrointestinal system: Abdomen is nondistended, soft, tender to palpation in epigastrium and right upper quadrant Psychiatry: Mood & affect appropriate for situation.   Assessment & Plan:  Principal Problem:   AKI (acute  kidney injury) (HCC) Active Problems:   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)   Mass of head of pancreas   COPD (chronic obstructive pulmonary disease) (HCC)   OSA (obstructive sleep apnea)   Paroxysmal atrial flutter (HCC)   Hypertension   Diabetes mellitus without complication (HCC)   GERD (gastroesophageal reflux disease)   Neoplasm related pain   Palliative care encounter   AKI - Suspect this is prerenal secondary to poor p.o. intake, certainly complicated by massive renal mass - Baseline creatinine 1 month ago 0.76 - Creatinine has improved some since arrival but appears to be plateauing - Continue to trend CMP - Renally dosed with a creatinine clearance of 23 when needed - Avoid nephrotoxic medications  Nausea Abdominal fullness - Secondary to pancreatic mass and large renal mass - Continue with scheduled Reglan  appears to be helping some.  -Advance to full diet today.  Patient is tolerating well - As needed Zofran   - Palliative care consulted.  Malignant pancreatic mass - Presumed primary cause of all of the above - CT scan from 8/12 reviewed: Right renal mass 11 x 9 x 9 cm, abutting the adjacent duodenum.  Mass of pancreatic head 3 x 5 cm. - Patient has recently established with oncology.  She is planning for outpatient biopsy - CA 19-9 has resulted and is very elevated. - Palliative care consulted, at this time prognosis appears unfortunately poor - 9/19: Cholangiogram through biliary drain,, bile duct stent placement with covered metallic stent. - Monitor LFTs  History renal cell carcinoma - Very large renal mass causing compression and upward displacement. - Needs outpatient follow-up with oncology   Paroxysmal A-fib - Continue Eliquis   OSA - Continue CPAP at night  COPD, no acute exacerbation - As needed home meds  DVT prophylaxis: Eliquis    Code Status: Limited: Do not attempt resuscitation (DNR) -DNR-LIMITED -Do Not Intubate/DNI   Disposition:  Inpatient until clinical improvement  Consultants:    Procedures:    Antimicrobials:  Anti-infectives (From admission, onward)    Start     Dose/Rate Route Frequency Ordered Stop   07/16/24 1515  cefOXitin  (MEFOXIN ) 2 g in sodium chloride  0.9 % 100 mL IVPB        2 g 200 mL/hr over 30 Minutes Intravenous  Once 07/16/24 1422 07/16/24 1513       Data Reviewed: I have personally reviewed following labs and imaging studies CBC: Recent Labs  Lab 07/13/24 1042 07/13/24 2041 07/14/24 0323 07/15/24 0848 07/16/24 0543 07/17/24 0319  WBC 8.2 8.4 7.5 5.9 6.6 6.9  NEUTROABS 5.8 5.5  --  3.3 3.9 4.5  HGB 12.4 11.8* 10.0* 10.4* 9.6* 10.0*  HCT 38.4 36.6 30.7* 31.8* 30.3* 30.7*  MCV 85.9 85.7 86.2 84.4 86.6 85.0  PLT 257 231 182 190 186 181   Basic Metabolic Panel: Recent Labs  Lab 07/13/24 2041 07/14/24 0323 07/15/24 0848 07/16/24 0543 07/17/24 0319  NA 137 139 139 140 143  K 4.7 4.7 4.5 4.1 4.1  CL 103 107 108 112* 112*  CO2 21* 22 24 19* 22  GLUCOSE 139* 96 109* 117* 109*  BUN 43* 40* 36* 36* 33*  CREATININE 3.05* 2.79* 2.60* 2.31* 2.44*  CALCIUM  9.7 8.9 9.1 8.7* 8.7*  MG  --   --   --  2.0 2.0  PHOS  --   --   --  2.8 3.3   GFR: Estimated Creatinine Clearance: 23.7 mL/min (A) (by C-G formula based on SCr of 2.44 mg/dL (H)). Liver Function Tests: Recent Labs  Lab 07/13/24 1042 07/13/24 2041 07/15/24 0848 07/16/24 0543 07/17/24 0319  AST 59* 64* 53* 39 28  ALT 43 45* 38 31 26  ALKPHOS 164* 149* 127* 127* 126  BILITOT 1.8* 1.6* 1.4* 1.0 0.9  PROT 8.0 7.7 6.5 6.0* 6.0*  ALBUMIN 3.7 3.3* 2.7* 2.6* 2.5*   CBG: Recent Labs  Lab 07/16/24 1630 07/16/24 2126 07/17/24 0806 07/17/24 1146 07/17/24 1308  GLUCAP 88 179* 111* 121* 123*    No results found for this or any previous visit (from the past 240 hours).   Radiology Studies: IR BILIARY STENT(S) EXIST ACCESS INC DILAT CATH EXCH/REMOVAL Result Date: 07/16/2024 INDICATION: Common bile  duct obstruction secondary to pancreatic mass and status post previous placement of internal/external biliary drain. Bilirubin has now normalized and the patient presents for possible internalization of biliary stenting via percutaneous approach. EXAM: BILIARY STENT PLACEMENT VIA PRE-EXISTING INTERNAL/EXTERNAL DRAIN APPROACH INCLUDING CHOLANGIOGRAM MEDICATIONS: 2 g IV cefoxitin ; The antibiotic was administered within an appropriate time frame prior to the initiation of the procedure. ANESTHESIA/SEDATION: Moderate (conscious) sedation was employed during this procedure. A total of Versed  3.0 mg and Fentanyl  125 mcg was administered intravenously by the radiology nurse. Total intra-service moderate Sedation Time: 51 minutes. The patient's level of consciousness and vital signs were monitored continuously by radiology nursing throughout the procedure under my direct supervision. FLUOROSCOPY: Radiation Exposure Index (as provided by the fluoroscopic device): 469 mGy Kerma COMPLICATIONS: None immediate. PROCEDURE: Informed written consent was obtained  from the patient after a thorough discussion of the procedural risks, benefits and alternatives. All questions were addressed. Maximal Sterile Barrier Technique was utilized including caps, mask, sterile gowns, sterile gloves, sterile drape, hand hygiene and skin antiseptic. A timeout was performed prior to the initiation of the procedure. Initial cholangiogram was performed through the pre-existing internal/external biliary drainage catheter. A radiopaque stent guided ruler had been placed along the patient's back in order to guide measurement assessment for stent placement. The drain was then cut and removed over a guidewire. A 7 French sheath was advanced over the wire to the level of the duodenum. Additional contrast and air was injected to outline the duodenal lumen. The sheath was retracted across the common bile duct in order to define length of common bile duct  stricture/obstruction. A 10 mm x 60 mm Wallflex covered biliary stent prosthesis was chosen for placement. The covered stent deployment system was advanced over a guidewire to the level of the duodenum and retracted into appropriate position. The stent was then deployed across the common bile duct. After stent deployment, the stent lumen was additionally dilated with an 8 mm x 40 mm Athletis balloon. Balloon dilatation was performed in 2 different segments in order to cover the stented segment. The balloon was deflated and removed. Additional cholangiogram was then performed through the 7 French sheath positioned at the confluence of right and left intrahepatic bile ducts as well as in the right intrahepatic bile ducts. Percutaneous right-sided biliary access was then removed with removal of guidewire and sheath. A gauze dressing was applied over the percutaneous access site. 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 Intrahepatic bile ducts are relatively decompressed. Based on length of estimated stricture, a 10 mm x 60 mm covered self expanding biliary stent was chosen for placement. After stent deployment, due to persistent narrowing of the stent in its midportion, the stent was further post dilated to 8 mm resulting in improved patency. The stent is well positioned extending from just below the biliary confluence of right and left intrahepatic ducts to the level of the duodenum. Excellent antegrade flow is documented through the stent after placement which allowed complete removal of percutaneous access on completion. 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 The covered stent was additionally post dilated with an 8 mm balloon and demonstrates  excellent patency extending from just below the biliary confluence to the duodenum with widely patent flow demonstrated after stent placement. Percutaneous biliary access was removed upon completion of the procedure. Electronically Signed   By: Marcey Moan M.D.   On: 07/16/2024 16:02    Scheduled Meds:  amiodarone   200 mg Oral Daily   apixaban   5 mg Oral BID   docusate sodium   100 mg Oral BID   feeding supplement  237 mL Oral TID BM   insulin  aspart  0-15 Units Subcutaneous TID WC   insulin  aspart  0-5 Units Subcutaneous QHS   lactulose   10 g Oral TID   lidocaine   10 mL Intradermal Once   metoCLOPramide  (REGLAN ) injection  5 mg Intravenous Q8H   metoprolol  tartrate  25 mg Oral BID   mirtazapine   7.5 mg Oral QHS   sodium chloride  flush  10-40 mL Intracatheter Q12H   Continuous Infusions:     LOS: 1  day  MDM: Patient is high risk for one or more organ failure.  They necessitate ongoing hospitalization for continued IV therapies and subsequent lab monitoring. Total time spent interpreting labs and vitals, reviewing the medical record, coordinating care amongst consultants and care team members, directly assessing and discussing care with the patient and/or family: 55 min  Mostafa Yuan, DO [facility]  To contact the attending physician between 7A-7P please use Epic Chat. To contact the covering physician during after hours 7P-7A, please review Amion.  07/17/2024, 2:13 PM   *This document has been created with the assistance of dictation software. Please excuse typographical errors. *

## 2024-07-17 NOTE — Plan of Care (Signed)

## 2024-07-18 DIAGNOSIS — N179 Acute kidney failure, unspecified: Secondary | ICD-10-CM | POA: Diagnosis not present

## 2024-07-18 LAB — COMPREHENSIVE METABOLIC PANEL WITH GFR
ALT: 21 U/L (ref 0–44)
AST: 25 U/L (ref 15–41)
Albumin: 2.2 g/dL — ABNORMAL LOW (ref 3.5–5.0)
Alkaline Phosphatase: 121 U/L (ref 38–126)
Anion gap: 8 (ref 5–15)
BUN: 35 mg/dL — ABNORMAL HIGH (ref 8–23)
CO2: 21 mmol/L — ABNORMAL LOW (ref 22–32)
Calcium: 8.7 mg/dL — ABNORMAL LOW (ref 8.9–10.3)
Chloride: 115 mmol/L — ABNORMAL HIGH (ref 98–111)
Creatinine, Ser: 2.6 mg/dL — ABNORMAL HIGH (ref 0.44–1.00)
GFR, Estimated: 19 mL/min — ABNORMAL LOW (ref >60)
Glucose, Bld: 116 mg/dL — ABNORMAL HIGH (ref 70–99)
Potassium: 4.3 mmol/L (ref 3.5–5.1)
Sodium: 144 mmol/L (ref 135–145)
Total Bilirubin: 0.9 mg/dL (ref 0.0–1.2)
Total Protein: 5.5 g/dL — ABNORMAL LOW (ref 6.5–8.1)

## 2024-07-18 LAB — CBC WITH DIFFERENTIAL/PLATELET
Abs Immature Granulocytes: 0.15 K/uL — ABNORMAL HIGH (ref 0.00–0.07)
Basophils Absolute: 0 K/uL (ref 0.0–0.1)
Basophils Relative: 0 %
Eosinophils Absolute: 0 K/uL (ref 0.0–0.5)
Eosinophils Relative: 0 %
HCT: 30 % — ABNORMAL LOW (ref 36.0–46.0)
Hemoglobin: 10 g/dL — ABNORMAL LOW (ref 12.0–15.0)
Immature Granulocytes: 1 %
Lymphocytes Relative: 8 %
Lymphs Abs: 1.3 K/uL (ref 0.7–4.0)
MCH: 28 pg (ref 26.0–34.0)
MCHC: 33.3 g/dL (ref 30.0–36.0)
MCV: 84 fL (ref 80.0–100.0)
Monocytes Absolute: 1.6 K/uL — ABNORMAL HIGH (ref 0.1–1.0)
Monocytes Relative: 10 %
Neutro Abs: 13.3 K/uL — ABNORMAL HIGH (ref 1.7–7.7)
Neutrophils Relative %: 81 %
Platelets: 171 K/uL (ref 150–400)
RBC: 3.57 MIL/uL — ABNORMAL LOW (ref 3.87–5.11)
RDW: 16.9 % — ABNORMAL HIGH (ref 11.5–15.5)
WBC: 16.4 K/uL — ABNORMAL HIGH (ref 4.0–10.5)
nRBC: 0 % (ref 0.0–0.2)

## 2024-07-18 LAB — GLUCOSE, CAPILLARY
Glucose-Capillary: 129 mg/dL — ABNORMAL HIGH (ref 70–99)
Glucose-Capillary: 106 mg/dL — ABNORMAL HIGH (ref 70–99)
Glucose-Capillary: 112 mg/dL — ABNORMAL HIGH (ref 70–99)
Glucose-Capillary: 133 mg/dL — ABNORMAL HIGH (ref 70–99)

## 2024-07-18 LAB — MAGNESIUM: Magnesium: 1.8 mg/dL (ref 1.7–2.4)

## 2024-07-18 LAB — PHOSPHORUS: Phosphorus: 2.9 mg/dL (ref 2.5–4.6)

## 2024-07-18 MED ORDER — GLUCERNA SHAKE PO LIQD
237.0000 mL | Freq: Three times a day (TID) | ORAL | Status: DC
  Administered 2024-07-18 – 2024-07-21 (×7): 237 mL via ORAL

## 2024-07-18 MED ORDER — LACTATED RINGERS IV SOLN: INTRAVENOUS | Status: AC

## 2024-07-18 NOTE — Progress Notes (Signed)
 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  Patient reports that she has had poor p.o. intake since cancer diagnosis.  She also endorses recurrent nausea.  She has recently met with oncology and palliative care and is planning for biopsy of the pancreatic mass.  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  Expanding metallic biliary stent was placed with improved biliary patency, external biliary stent was removed.  Subjective: Patient still complaining of low appetite today.  We again discussed the masses in her abdomen.  She has requested I call her husband to discuss this as well.  Objective: Vitals:   07/17/24 1558 07/17/24 2054 07/18/24 0511 07/18/24 0733  BP: 128/64 133/85 (!) 95/58 94/64  Pulse: 86 75 94 91  Resp: 16 14 18 19   Temp: 97.8 F (36.6 C) 98.3 F (36.8 C) 98.7 F (37.1 C) 98.2 F (36.8 C)  TempSrc:   Oral Oral  SpO2: 100% 99% 100% 100%  Weight:      Height:        Intake/Output Summary (Last 24 hours) at 07/18/2024 1434 Last data filed at 07/17/2024 1957 Gross per 24 hour  Intake 80 ml  Output --  Net 80 ml    Filed Weights   07/13/24 1612  Weight: 102 kg    Examination: General exam: Appears calm and comfortable, NAD  Respiratory system: No work of breathing, symmetric chest wall expansion Cardiovascular system: S1 & S2 heard, RRR.  Gastrointestinal system: Abdomen is nondistended, soft, tender to palpation in epigastrium and right upper quadrant Psychiatry: Mood & affect  appropriate for situation.   Assessment & Plan:  Principal Problem:   AKI (acute kidney injury) (HCC) Active Problems:   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)   Mass of head of pancreas   COPD (chronic obstructive pulmonary disease) (HCC)   OSA (obstructive sleep apnea)   Paroxysmal atrial flutter (HCC)   Hypertension   Diabetes mellitus without complication (HCC)   GERD (gastroesophageal reflux disease)   Neoplasm related pain   Palliative care encounter   AKI - Suspect this is prerenal secondary to poor p.o. intake, certainly complicated by massive renal mass - Baseline creatinine 1 month ago 0.76 - Creatinine has improved some since arrival but appears to be plateauing - Will discuss directly with nephrology - Continue to trend CMP - Renally dosed with a creatinine clearance of 22 and needed - Avoid nephrotoxic medications - Continue IV fluids until p.o. intake has improved  Nausea Abdominal fullness - Secondary to pancreatic mass and large renal mass - Has been on scheduled Reglan  and is having minimal benefit - Continue with current diet, nausea appears resolving but appetite remains poor  Malignant pancreatic mass - Presumed primary cause of all of the above - CT scan from 8/12 reviewed: Right renal mass 11 x 9 x 9 cm, abutting the adjacent duodenum.  Mass of pancreatic head 3 x 5 cm. - Patient has recently established with oncology.  She is planning for outpatient biopsy - CA 19-9 has resulted and is very elevated. -  Palliative care consulted, at this time prognosis appears unfortunately poor - Had extensive discussion with the patient's husband today regarding overall prognosis.  Have also reached out to oncology for their engagement. - Continue to monitor CMP - 9/19: Cholangiogram through biliary drain,, bile duct stent placement with covered metallic stent.  History renal cell carcinoma - Very large renal mass causing compression and upward  displacement. - Needs outpatient follow-up with oncology   Paroxysmal A-fib - Continue Eliquis   OSA - Continue CPAP at night  COPD, no acute exacerbation - As needed home meds  DVT prophylaxis: Eliquis    Code Status: Limited: Do not attempt resuscitation (DNR) -DNR-LIMITED -Do Not Intubate/DNI  Disposition:  Inpatient until clinical improvement  Consultants:    Procedures:    Antimicrobials:  Anti-infectives (From admission, onward)    Start     Dose/Rate Route Frequency Ordered Stop   07/16/24 1515  cefOXitin  (MEFOXIN ) 2 g in sodium chloride  0.9 % 100 mL IVPB        2 g 200 mL/hr over 30 Minutes Intravenous  Once 07/16/24 1422 07/16/24 1513       Data Reviewed: I have personally reviewed following labs and imaging studies CBC: Recent Labs  Lab 07/13/24 2041 07/14/24 0323 07/15/24 0848 07/16/24 0543 07/17/24 0319 07/18/24 0442  WBC 8.4 7.5 5.9 6.6 6.9 16.4*  NEUTROABS 5.5  --  3.3 3.9 4.5 13.3*  HGB 11.8* 10.0* 10.4* 9.6* 10.0* 10.0*  HCT 36.6 30.7* 31.8* 30.3* 30.7* 30.0*  MCV 85.7 86.2 84.4 86.6 85.0 84.0  PLT 231 182 190 186 181 171   Basic Metabolic Panel: Recent Labs  Lab 07/14/24 0323 07/15/24 0848 07/16/24 0543 07/17/24 0319 07/18/24 0442  NA 139 139 140 143 144  K 4.7 4.5 4.1 4.1 4.3  CL 107 108 112* 112* 115*  CO2 22 24 19* 22 21*  GLUCOSE 96 109* 117* 109* 116*  BUN 40* 36* 36* 33* 35*  CREATININE 2.79* 2.60* 2.31* 2.44* 2.60*  CALCIUM  8.9 9.1 8.7* 8.7* 8.7*  MG  --   --  2.0 2.0 1.8  PHOS  --   --  2.8 3.3 2.9   GFR: Estimated Creatinine Clearance: 22.3 mL/min (A) (by C-G formula based on SCr of 2.6 mg/dL (H)). Liver Function Tests: Recent Labs  Lab 07/13/24 2041 07/15/24 0848 07/16/24 0543 07/17/24 0319 07/18/24 0442  AST 64* 53* 39 28 25  ALT 45* 38 31 26 21   ALKPHOS 149* 127* 127* 126 121  BILITOT 1.6* 1.4* 1.0 0.9 0.9  PROT 7.7 6.5 6.0* 6.0* 5.5*  ALBUMIN 3.3* 2.7* 2.6* 2.5* 2.2*   CBG: Recent Labs  Lab  07/17/24 1308 07/17/24 1557 07/17/24 1956 07/18/24 0759 07/18/24 1115  GLUCAP 123* 120* 122* 129* 106*    No results found for this or any previous visit (from the past 240 hours).   Radiology Studies: IR BILIARY STENT(S) EXIST ACCESS INC DILAT CATH EXCH/REMOVAL Result Date: 07/16/2024 INDICATION: Common bile duct obstruction secondary to pancreatic mass and status post previous placement of internal/external biliary drain. Bilirubin has now normalized and the patient presents for possible internalization of biliary stenting via percutaneous approach. EXAM: BILIARY STENT PLACEMENT VIA PRE-EXISTING INTERNAL/EXTERNAL DRAIN APPROACH INCLUDING CHOLANGIOGRAM MEDICATIONS: 2 g IV cefoxitin ; The antibiotic was administered within an appropriate time frame prior to the initiation of the procedure. ANESTHESIA/SEDATION: Moderate (conscious) sedation was employed during this procedure. A total of Versed  3.0 mg and Fentanyl  125 mcg was administered intravenously by the radiology nurse. Total intra-service  moderate Sedation Time: 51 minutes. The patient's level of consciousness and vital signs were monitored continuously by radiology nursing throughout the procedure under my direct supervision. FLUOROSCOPY: Radiation Exposure Index (as provided by the fluoroscopic device): 469 mGy Kerma COMPLICATIONS: None immediate. PROCEDURE: Informed written consent was obtained from the patient after a thorough discussion of the procedural risks, benefits and alternatives. All questions were addressed. Maximal Sterile Barrier Technique was utilized including caps, mask, sterile gowns, sterile gloves, sterile drape, hand hygiene and skin antiseptic. A timeout was performed prior to the initiation of the procedure. Initial cholangiogram was performed through the pre-existing internal/external biliary drainage catheter. A radiopaque stent guided ruler had been placed along the patient's back in order to guide measurement assessment  for stent placement. The drain was then cut and removed over a guidewire. A 7 French sheath was advanced over the wire to the level of the duodenum. Additional contrast and air was injected to outline the duodenal lumen. The sheath was retracted across the common bile duct in order to define length of common bile duct stricture/obstruction. A 10 mm x 60 mm Wallflex covered biliary stent prosthesis was chosen for placement. The covered stent deployment system was advanced over a guidewire to the level of the duodenum and retracted into appropriate position. The stent was then deployed across the common bile duct. After stent deployment, the stent lumen was additionally dilated with an 8 mm x 40 mm Athletis balloon. Balloon dilatation was performed in 2 different segments in order to cover the stented segment. The balloon was deflated and removed. Additional cholangiogram was then performed through the 7 French sheath positioned at the confluence of right and left intrahepatic bile ducts as well as in the right intrahepatic bile ducts. Percutaneous right-sided biliary access was then removed with removal of guidewire and sheath. A gauze dressing was applied over the percutaneous access site. 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 Intrahepatic bile ducts are relatively decompressed. Based on length of estimated stricture, a 10 mm x 60 mm covered self expanding biliary stent was chosen for placement. After stent deployment, due to persistent narrowing of the stent in its midportion, the stent was further post dilated to 8 mm resulting in improved patency. The stent is well positioned extending from just below the biliary confluence of right and left intrahepatic ducts to the level of the duodenum. Excellent antegrade flow is documented through the stent after placement which allowed complete removal of  percutaneous access on completion. 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 The covered stent was additionally post dilated with an 8 mm balloon and demonstrates excellent patency extending from just below the biliary confluence to the duodenum with widely patent flow demonstrated after stent placement. Percutaneous biliary access was removed upon completion of the procedure. Electronically Signed   By: Marcey Moan M.D.   On: 07/16/2024 16:02    Scheduled Meds:  amiodarone   200 mg Oral Daily   apixaban   5 mg Oral BID   docusate sodium   100 mg Oral BID   feeding supplement  237 mL Oral TID BM   feeding supplement (GLUCERNA SHAKE)  237 mL Oral TID BM   insulin  aspart  0-15 Units Subcutaneous TID WC   insulin  aspart  0-5 Units Subcutaneous QHS   lactulose   10 g Oral TID   lidocaine   10 mL Intradermal Once   metoCLOPramide  (REGLAN ) injection  5 mg Intravenous Q8H   metoprolol  tartrate  25 mg Oral BID   mirtazapine   7.5 mg Oral QHS   sodium chloride  flush  10-40 mL Intracatheter Q12H   Continuous Infusions:  lactated ringers  100 mL/hr at 07/18/24 1030      LOS: 2 days  MDM: Patient is high risk for one or more organ failure.  They necessitate ongoing hospitalization for continued IV therapies and subsequent lab monitoring. Total time spent interpreting labs and vitals, reviewing the medical record, coordinating care amongst consultants and care team members, directly assessing and discussing care with the patient and/or family: 55 min  Zayon Trulson, DO [facility]  To contact the attending physician between 7A-7P please use Epic Chat. To contact the covering physician during after hours 7P-7A, please review Amion.  07/18/2024, 2:34 PM   *This document has been created with the assistance of dictation software. Please excuse  typographical errors. *

## 2024-07-18 NOTE — Plan of Care (Signed)

## 2024-07-18 NOTE — Plan of Care (Signed)
 Problem: Education: Goal: Ability to describe self-care measures that may prevent or decrease complications (Diabetes Survival Skills Education) will improve 07/18/2024 0518 by Angelica Credit, RN Outcome: Progressing 07/18/2024 0518 by Angelica Credit, RN Outcome: Progressing Goal: Individualized Educational Video(s) 07/18/2024 0518 by Angelica Credit, RN Outcome: Progressing 07/18/2024 0518 by Angelica Credit, RN Outcome: Progressing   Problem: Coping: Goal: Ability to adjust to condition or change in health will improve 07/18/2024 0518 by Angelica Credit, RN Outcome: Progressing 07/18/2024 0518 by Angelica Credit, RN Outcome: Progressing   Problem: Fluid Volume: Goal: Ability to maintain a balanced intake and output will improve 07/18/2024 0518 by Angelica Credit, RN Outcome: Progressing 07/18/2024 0518 by Angelica Credit, RN Outcome: Progressing   Problem: Health Behavior/Discharge Planning: Goal: Ability to identify and utilize available resources and services will improve 07/18/2024 0518 by Angelica Credit, RN Outcome: Progressing 07/18/2024 0518 by Angelica Credit, RN Outcome: Progressing Goal: Ability to manage health-related needs will improve 07/18/2024 0518 by Angelica Credit, RN Outcome: Progressing 07/18/2024 0518 by Angelica Credit, RN Outcome: Progressing   Problem: Metabolic: Goal: Ability to maintain appropriate glucose levels will improve 07/18/2024 0518 by Angelica Credit, RN Outcome: Progressing 07/18/2024 0518 by Angelica Credit, RN Outcome: Progressing   Problem: Nutritional: Goal: Maintenance of adequate nutrition will improve 07/18/2024 0518 by Angelica Credit, RN Outcome: Progressing 07/18/2024 0518 by Angelica Credit, RN Outcome: Progressing Goal: Progress toward achieving an optimal weight will improve 07/18/2024 0518 by Angelica Credit, RN Outcome: Progressing 07/18/2024 0518 by Angelica Credit, RN Outcome: Progressing   Problem: Skin Integrity: Goal: Risk for impaired skin  integrity will decrease 07/18/2024 0518 by Angelica Credit, RN Outcome: Progressing 07/18/2024 0518 by Angelica Credit, RN Outcome: Progressing   Problem: Tissue Perfusion: Goal: Adequacy of tissue perfusion will improve 07/18/2024 0518 by Angelica Credit, RN Outcome: Progressing 07/18/2024 0518 by Angelica Credit, RN Outcome: Progressing   Problem: Education: Goal: Knowledge of General Education information will improve Description: Including pain rating scale, medication(s)/side effects and non-pharmacologic comfort measures 07/18/2024 0518 by Angelica Credit, RN Outcome: Progressing 07/18/2024 0518 by Angelica Credit, RN Outcome: Progressing   Problem: Health Behavior/Discharge Planning: Goal: Ability to manage health-related needs will improve 07/18/2024 0518 by Angelica Credit, RN Outcome: Progressing 07/18/2024 0518 by Angelica Credit, RN Outcome: Progressing   Problem: Clinical Measurements: Goal: Ability to maintain clinical measurements within normal limits will improve 07/18/2024 0518 by Angelica Credit, RN Outcome: Progressing 07/18/2024 0518 by Angelica Credit, RN Outcome: Progressing Goal: Will remain free from infection 07/18/2024 0518 by Angelica Credit, RN Outcome: Progressing 07/18/2024 0518 by Angelica Credit, RN Outcome: Progressing Goal: Diagnostic test results will improve 07/18/2024 0518 by Angelica Credit, RN Outcome: Progressing 07/18/2024 0518 by Angelica Credit, RN Outcome: Progressing Goal: Respiratory complications will improve 07/18/2024 0518 by Angelica Credit, RN Outcome: Progressing 07/18/2024 0518 by Angelica Credit, RN Outcome: Progressing Goal: Cardiovascular complication will be avoided 07/18/2024 0518 by Angelica Credit, RN Outcome: Progressing 07/18/2024 0518 by Angelica Credit, RN Outcome: Progressing   Problem: Activity: Goal: Risk for activity intolerance will decrease 07/18/2024 0518 by Angelica Credit, RN Outcome: Progressing 07/18/2024 0518 by Angelica Credit, RN Outcome:  Progressing   Problem: Nutrition: Goal: Adequate nutrition will be maintained 07/18/2024 0518 by Angelica Credit, RN Outcome: Progressing 07/18/2024 0518 by Angelica Credit, RN Outcome: Progressing   Problem: Coping: Goal: Level of anxiety will decrease 07/18/2024 0518 by Angelica Credit, RN Outcome: Progressing 07/18/2024 0518 by Angelica Credit, RN Outcome: Progressing   Problem: Elimination: Goal: Will not experience complications related to bowel motility 07/18/2024 0518  by Angelica Credit, RN Outcome: Progressing 07/18/2024 0518 by Angelica Credit, RN Outcome: Progressing Goal: Will not experience complications related to urinary retention 07/18/2024 0518 by Angelica Credit, RN Outcome: Progressing 07/18/2024 0518 by Angelica Credit, RN Outcome: Progressing   Problem: Pain Managment: Goal: General experience of comfort will improve and/or be controlled 07/18/2024 0518 by Angelica Credit, RN Outcome: Progressing 07/18/2024 0518 by Angelica Credit, RN Outcome: Progressing   Problem: Safety: Goal: Ability to remain free from injury will improve Outcome: Progressing   Problem: Skin Integrity: Goal: Risk for impaired skin integrity will decrease Outcome: Progressing

## 2024-07-19 DIAGNOSIS — N179 Acute kidney failure, unspecified: Secondary | ICD-10-CM | POA: Diagnosis not present

## 2024-07-19 DIAGNOSIS — Z515 Encounter for palliative care: Secondary | ICD-10-CM | POA: Diagnosis not present

## 2024-07-19 LAB — COMPREHENSIVE METABOLIC PANEL WITH GFR
ALT: 17 U/L (ref 0–44)
AST: 17 U/L (ref 15–41)
Albumin: 2.1 g/dL — ABNORMAL LOW (ref 3.5–5.0)
Alkaline Phosphatase: 100 U/L (ref 38–126)
Anion gap: 7 (ref 5–15)
BUN: 43 mg/dL — ABNORMAL HIGH (ref 8–23)
CO2: 20 mmol/L — ABNORMAL LOW (ref 22–32)
Calcium: 8.3 mg/dL — ABNORMAL LOW (ref 8.9–10.3)
Chloride: 111 mmol/L (ref 98–111)
Creatinine, Ser: 2.72 mg/dL — ABNORMAL HIGH (ref 0.44–1.00)
GFR, Estimated: 18 mL/min — ABNORMAL LOW (ref >60)
Glucose, Bld: 105 mg/dL — ABNORMAL HIGH (ref 70–99)
Potassium: 4.1 mmol/L (ref 3.5–5.1)
Sodium: 138 mmol/L (ref 135–145)
Total Bilirubin: 1 mg/dL (ref 0.0–1.2)
Total Protein: 5.4 g/dL — ABNORMAL LOW (ref 6.5–8.1)

## 2024-07-19 LAB — GLUCOSE, CAPILLARY
Glucose-Capillary: 111 mg/dL — ABNORMAL HIGH (ref 70–99)
Glucose-Capillary: 129 mg/dL — ABNORMAL HIGH (ref 70–99)
Glucose-Capillary: 99 mg/dL (ref 70–99)
Glucose-Capillary: 84 mg/dL (ref 70–99)

## 2024-07-19 LAB — CBC WITH DIFFERENTIAL/PLATELET
Abs Immature Granulocytes: 0.1 K/uL — ABNORMAL HIGH (ref 0.00–0.07)
Basophils Absolute: 0 K/uL (ref 0.0–0.1)
Basophils Relative: 0 %
Eosinophils Absolute: 0.1 K/uL (ref 0.0–0.5)
Eosinophils Relative: 1 %
HCT: 26.3 % — ABNORMAL LOW (ref 36.0–46.0)
Hemoglobin: 8.8 g/dL — ABNORMAL LOW (ref 12.0–15.0)
Immature Granulocytes: 1 %
Lymphocytes Relative: 11 %
Lymphs Abs: 1.4 K/uL (ref 0.7–4.0)
MCH: 28.3 pg (ref 26.0–34.0)
MCHC: 33.5 g/dL (ref 30.0–36.0)
MCV: 84.6 fL (ref 80.0–100.0)
Monocytes Absolute: 1.4 K/uL — ABNORMAL HIGH (ref 0.1–1.0)
Monocytes Relative: 11 %
Neutro Abs: 9.9 K/uL — ABNORMAL HIGH (ref 1.7–7.7)
Neutrophils Relative %: 76 %
Platelets: 153 K/uL (ref 150–400)
RBC: 3.11 MIL/uL — ABNORMAL LOW (ref 3.87–5.11)
RDW: 16.9 % — ABNORMAL HIGH (ref 11.5–15.5)
WBC: 12.9 K/uL — ABNORMAL HIGH (ref 4.0–10.5)
nRBC: 0 % (ref 0.0–0.2)

## 2024-07-19 LAB — PHOSPHORUS: Phosphorus: 2.7 mg/dL (ref 2.5–4.6)

## 2024-07-19 LAB — MAGNESIUM: Magnesium: 1.8 mg/dL (ref 1.7–2.4)

## 2024-07-19 MED ORDER — PANTOPRAZOLE SODIUM 40 MG PO TBEC
40.0000 mg | DELAYED_RELEASE_TABLET | Freq: Every day | ORAL | Status: DC
  Administered 2024-07-19 – 2024-07-28 (×9): 40 mg via ORAL
  Filled 2024-07-19 (×9): qty 1

## 2024-07-19 MED ORDER — LACTATED RINGERS IV SOLN: INTRAVENOUS | Status: AC

## 2024-07-19 MED ORDER — OLANZAPINE 5 MG PO TABS
10.0000 mg | ORAL_TABLET | Freq: Every day | ORAL | Status: DC
  Administered 2024-07-19 – 2024-07-26 (×8): 10 mg via ORAL
  Filled 2024-07-19 (×8): qty 2

## 2024-07-19 NOTE — Progress Notes (Signed)
 Palliative Medicine [facility] at [facility] [phone] [phone]   Name: Alyssa Leonard Date: 07/19/2024 MRN: [id number]  DOB: 07/18/1956  Patient Care Team: [name] LABOR, MD as PCP - General (Internal Medicine) Darron Deatrice LABOR, MD as PCP - Cardiology (Cardiology) Luke Elsie GRADE, MD as PCP - Hematology/Oncology (Oncology) Kennyth Chew, MD as PCP - Electrophysiology (Cardiology) Melanee Annah BROCKS, MD as Consulting Physician (Oncology) Maurie Rayfield BIRCH, RN as Oncology Nurse Navigator    REASON FOR CONSULTATION: Alyssa Leonard is a 68 y.o. female with multiple medical problems including morbid obesity, OSA on CPAP, a flutter who could not afford Xarelto  and amiodarone , diabetes, and RCC with intra-abdominal metastasis previously on treatment with immunotherapy.  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  She was receiving treatment at [facility] but transferred care.  Patient was admitted to the hospital with AKI and presumed dehydration.  Palliative care consulted to address goals of manage ongoing symptoms.    CODE STATUS: DNR  PAST MEDICAL HISTORY: Past Medical History:  Diagnosis Date   Asthma    Atrial flutter (HCC)    COPD (chronic obstructive pulmonary disease) (HCC)    Diabetes mellitus without complication (HCC)    GERD (gastroesophageal reflux disease)    History of colon polyps    Hypertension    Meniere disease    OSA (obstructive sleep apnea)    Renal cell carcinoma (HCC)    Sleep apnea    Went for sleep study test in January 2018 I haven't heard back from test    PAST SURGICAL HISTORY:  Past Surgical History:  Procedure Laterality Date   ABDOMINAL HYSTERECTOMY     BARIATRIC SURGERY     CHOLECYSTECTOMY     COLONOSCOPY WITH PROPOFOL  N/A 12/11/2016   Procedure: COLONOSCOPY WITH PROPOFOL ;  Surgeon: Lamar ONEIDA Holmes, MD;  Location: [facility] ENDOSCOPY;  Service: Endoscopy;   Laterality: N/A;   COLONOSCOPY WITH PROPOFOL  N/A 11/27/2021   Procedure: COLONOSCOPY WITH PROPOFOL ;  Surgeon: Maryruth Ole ONEIDA, MD;  Location: [facility] ENDOSCOPY;  Service: Endoscopy;  Laterality: N/A;   IR BILIARY STENT(S) EXIST ACCESS INC DILAT CATH EXCH/REMOVAL  07/16/2024   IR CONVERT BILIARY DRAIN TO INT EXT BILIARY DRAIN  06/29/2024   IR EXCHANGE BILIARY DRAIN  06/22/2024   IR INT EXT BILIARY DRAIN WITH CHOLANGIOGRAM  06/09/2024   IR RADIOLOGIST EVAL & MGMT  06/15/2024    HEMATOLOGY/ONCOLOGY HISTORY:  Oncology History   No history exists.    ALLERGIES:  is allergic to latex, aspirin, and plasticized base [plastibase].  MEDICATIONS:  Current Facility-Administered Medications  Medication Dose Route Frequency Provider Last Rate Last Admin   albuterol  (PROVENTIL ) (2.5 MG/3ML) 0.083% nebulizer solution 2.5 mg  2.5 mg Nebulization Q6H PRN Dail Rankin RAMAN, RPH       amiodarone  (PACERONE ) tablet 200 mg  200 mg Oral Daily Cox, Amy N, DO   200 mg at 07/18/24 1020   apixaban  (ELIQUIS ) tablet 5 mg  5 mg Oral BID Cox, Amy N, DO   5 mg at 07/18/24 2124   bisacodyl  (DULCOLAX) EC tablet 5 mg  5 mg Oral Daily PRN Cox, Amy N, DO       diazepam  (VALIUM ) tablet 2 mg  2 mg Oral BID PRN Cox, Amy N, DO       docusate sodium  (COLACE) capsule 100 mg  100 mg Oral BID Cox, Amy N, DO   100 mg at 07/18/24 2123  famotidine  (PEPCID ) tablet 20 mg  20 mg Oral BID BM & HS PRN Dezii, Alexandra, DO   20 mg at 07/14/24 1026   feeding supplement (ENSURE PLUS HIGH PROTEIN) liquid 237 mL  237 mL Oral TID BM Cox, Amy N, DO   237 mL at 07/15/24 1041   feeding supplement (GLUCERNA SHAKE) (GLUCERNA SHAKE) liquid 237 mL  237 mL Oral TID BM Dezii, Alexandra, DO   237 mL at 07/18/24 1544   insulin  aspart (novoLOG ) injection 0-15 Units  0-15 Units Subcutaneous TID WC Cox, Amy N, DO   2 Units at 07/18/24 0808   insulin  aspart (novoLOG ) injection 0-5 Units  0-5 Units Subcutaneous QHS Cox, Amy N, DO       lactulose  (CHRONULAC ) 10  GM/15ML solution 10 g  10 g Oral TID Dezii, Alexandra, DO   10 g at 07/17/24 1056   lidocaine  (XYLOCAINE ) 1 % (with pres) injection 10 mL  10 mL Intradermal Once [name] Aran, MD       meclizine  (ANTIVERT ) tablet 25 mg  25 mg Oral TID PRN Cox, Amy N, DO       metoCLOPramide  (REGLAN ) injection 5 mg  5 mg Intravenous Q8H Dezii, Alexandra, DO   5 mg at 07/19/24 0525   metoprolol  tartrate (LOPRESSOR ) tablet 25 mg  25 mg Oral BID Cox, Amy N, DO   25 mg at 07/18/24 2124   mirtazapine  (REMERON ) tablet 7.5 mg  7.5 mg Oral QHS Cox, Amy N, DO   7.5 mg at 07/18/24 2124   oxyCODONE  (Oxy IR/ROXICODONE ) immediate release tablet 5 mg  5 mg Oral Q8H PRN Cox, Amy N, DO   5 mg at 07/19/24 0526   senna-docusate (Senokot-S) tablet 1 tablet  1 tablet Oral QHS PRN Cox, Amy N, DO       sodium chloride  flush (NS) 0.9 % injection 10-40 mL  10-40 mL Intracatheter Q12H Dezii, Alexandra, DO   10 mL at 07/18/24 2125   sodium chloride  flush (NS) 0.9 % injection 10-40 mL  10-40 mL Intracatheter PRN Dezii, Alexandra, DO        VITAL SIGNS: BP 115/68 (BP Location: Left Arm)   Pulse 68   Temp 98 F (36.7 C) (Oral)   Resp 17   Ht 5' (1.524 m)   Wt 224 lb 13.9 oz (102 kg)   SpO2 100%   BMI 43.92 kg/m  Filed Weights   07/13/24 1612  Weight: 224 lb 13.9 oz (102 kg)    Estimated body mass index is 43.92 kg/m as calculated from the following:   Height as of this encounter: 5' (1.524 m).   Weight as of this encounter: 224 lb 13.9 oz (102 kg).  LABS: CBC:    Component Value Date/Time   WBC 12.9 (H) 07/19/2024 0416   HGB 8.8 (L) 07/19/2024 0416   HGB 12.2 08/15/2023 1147   HCT 26.3 (L) 07/19/2024 0416   PLT 153 07/19/2024 0416   PLT 181 08/15/2023 1147   MCV 84.6 07/19/2024 0416   NEUTROABS 9.9 (H) 07/19/2024 0416   LYMPHSABS 1.4 07/19/2024 0416   MONOABS 1.4 (H) 07/19/2024 0416   EOSABS 0.1 07/19/2024 0416   BASOSABS 0.0 07/19/2024 0416   Comprehensive Metabolic Panel:    Component Value Date/Time   NA  138 07/19/2024 0416   K 4.1 07/19/2024 0416   CL 111 07/19/2024 0416   CO2 20 (L) 07/19/2024 0416   BUN 43 (H) 07/19/2024 0416   CREATININE 2.72 (H) 07/19/2024  0416   CREATININE 1.01 (H) 08/15/2023 1147   GLUCOSE 105 (H) 07/19/2024 0416   CALCIUM  8.3 (L) 07/19/2024 0416   AST 17 07/19/2024 0416   AST 16 08/15/2023 1147   ALT 17 07/19/2024 0416   ALT 12 08/15/2023 1147   ALKPHOS 100 07/19/2024 0416   BILITOT 1.0 07/19/2024 0416   BILITOT 0.6 08/15/2023 1147   PROT 5.4 (L) 07/19/2024 0416   ALBUMIN 2.1 (L) 07/19/2024 0416    RADIOGRAPHIC STUDIES: IR BILIARY STENT(S) EXIST ACCESS INC DILAT CATH EXCH/REMOVAL Result Date: 07/16/2024 INDICATION: Common bile duct obstruction secondary to pancreatic mass and status post previous placement of internal/external biliary drain. Bilirubin has now normalized and the patient presents for possible internalization of biliary stenting via percutaneous approach. EXAM: BILIARY STENT PLACEMENT VIA PRE-EXISTING INTERNAL/EXTERNAL DRAIN APPROACH INCLUDING CHOLANGIOGRAM MEDICATIONS: 2 g IV cefoxitin ; The antibiotic was administered within an appropriate time frame prior to the initiation of the procedure. ANESTHESIA/SEDATION: Moderate (conscious) sedation was employed during this procedure. A total of Versed  3.0 mg and Fentanyl  125 mcg was administered intravenously by the radiology nurse. Total intra-service moderate Sedation Time: 51 minutes. The patient's level of consciousness and vital signs were monitored continuously by radiology nursing throughout the procedure under my direct supervision. FLUOROSCOPY: Radiation Exposure Index (as provided by the fluoroscopic device): 469 mGy Kerma COMPLICATIONS: None immediate. PROCEDURE: Informed written consent was obtained from the patient after a thorough discussion of the procedural risks, benefits and alternatives. All questions were addressed. Maximal Sterile Barrier Technique was utilized including caps, mask, sterile  gowns, sterile gloves, sterile drape, hand hygiene and skin antiseptic. A timeout was performed prior to the initiation of the procedure. Initial cholangiogram was performed through the pre-existing internal/external biliary drainage catheter. A radiopaque stent guided ruler had been placed along the patient's back in order to guide measurement assessment for stent placement. The drain was then cut and removed over a guidewire. A 7 French sheath was advanced over the wire to the level of the duodenum. Additional contrast and air was injected to outline the duodenal lumen. The sheath was retracted across the common bile duct in order to define length of common bile duct stricture/obstruction. A 10 mm x 60 mm Wallflex covered biliary stent prosthesis was chosen for placement. The covered stent deployment system was advanced over a guidewire to the level of the duodenum and retracted into appropriate position. The stent was then deployed across the common bile duct. After stent deployment, the stent lumen was additionally dilated with an 8 mm x 40 mm Athletis balloon. Balloon dilatation was performed in 2 different segments in order to cover the stented segment. The balloon was deflated and removed. Additional cholangiogram was then performed through the 7 French sheath positioned at the confluence of right and left intrahepatic bile ducts as well as in the right intrahepatic bile ducts. Percutaneous right-sided biliary access was then removed with removal of guidewire and sheath. A gauze dressing was applied over the percutaneous access site. 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 Intrahepatic bile ducts are relatively decompressed. Based on length of estimated stricture, a 10 mm x 60 mm covered self expanding biliary stent was chosen for placement. After stent deployment, due to persistent narrowing of  the stent in its midportion, the stent was further post dilated to 8 mm resulting in improved patency. The stent is well positioned extending from just below the biliary  confluence of right and left intrahepatic ducts to the level of the duodenum. Excellent antegrade flow is documented through the stent after placement which allowed complete removal of percutaneous access on completion. 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 The covered stent was additionally post dilated with an 8 mm balloon and demonstrates excellent patency extending from just below the biliary confluence to the duodenum with widely patent flow demonstrated after stent placement. Percutaneous biliary access was removed upon completion of the procedure. Electronically Signed   By: Marcey Moan M.D.   On: 07/16/2024 16:02   DG Abd 2 Views Result Date: 07/01/2024 CLINICAL DATA:  Constipation. EXAM: ABDOMEN - 2 VIEW COMPARISON:  CT 06/08/2024 FINDINGS: No bowel dilatation or evidence of obstruction. Air and small volume of stool throughout nondilated colon. Small volume of stool in the rectum. Multiple surgical clips in the left upper quadrant. A few surgical clips in the pelvis. Right upper quadrant biliary stent. No free intra-abdominal air. IMPRESSION: Small volume formed stool in the colon. No bowel obstruction. Electronically Signed   By: Andrea Gasman M.D.   On: 07/01/2024 00:03   IR CONVERT BILIARY DRAIN TO INT EXT BILIARY DRAIN Result Date: 06/29/2024 INDICATION: Biliary obstruction in a patient with an indwelling biliary drain previously placed 06/09/2024. On 06/22/2024, the patient returned for further evaluation to find that the catheter had partially retracted internally with a redundant loop of the 10 [place] biliary drain extra capsular to the liver. Despite multiple  attempts to gain access across the common bile duct obstruction, the biliary drainage catheter would not track across this obstruction and a drain was left in the common bile duct. The patient returns today for further evaluation and potential new access or converting the catheter to a better positioned internal/external biliary drainage catheter EXAM: Drain exchange COMPARISON:  None Available. ANESTHESIA/SEDATION: Moderate (conscious) sedation was employed during this procedure. A total of Versed  3 mg and Fentanyl  200 mcg was administered intravenously by the radiology nurse. Total intra-service moderate Sedation Time: 30 minutes. The patient's level of consciousness and vital signs were monitored continuously by radiology nursing throughout the procedure under my direct supervision. CONTRAST:  25 mL Omnipaque  300-administered into the collecting system(s) FLUOROSCOPY: Radiation Exposure Index (as provided by the fluoroscopic device): 406 mGy Kerma COMPLICATIONS: None immediate. PROCEDURE: Informed written consent was obtained from the patient after a thorough discussion of the procedural risks, benefits and alternatives. All questions were addressed. Maximal Sterile Barrier Technique was utilized including caps, mask, sterile gowns, sterile gloves, sterile drape, hand hygiene and skin antiseptic. A timeout was performed prior to the initiation of the procedure. In a supine position, the right upper quadrant and external portion of the catheter were prepped and draped in usual sterile fashion. Dilute contrast was injected into the indwelling biliary drain filling the right lobe bile ducts as well as the common bile duct. Common bile duct is markedly dilated with moderate dilatation of the intrahepatic ducts. Multiple attempts were then made to advance a guidewire through the pigtail catheter and out through the ampulla into the duodenal without success. The pigtail catheter was cut and removed over a Bentson  guidewire. An angled glide catheter was then advanced over the Bentson and attempts at reducing the redundant extra capsular loop were unsuccessful. The catheter was then directed toward the ampulla within the common bile duct and using a combination  of Glidewire and Bentson guidewire finally gaining access into the duodenum. Once duodenal access was obtained, contrast was injected verifying intraluminal position within the bowel. The Bentson guidewire was then advanced into the jejunum until enough guidewire was demonstrated to be looping redundantly within the jejunum giving a long length of guidewire position within the small bowel. The glide catheter was then withdrawn. Slow gentle traction was then applied to the guidewire with minimal reduction in the redundant extra capsular loop. Rapid and somewhat forceful traction was then applied to the guidewire and the redundant loop was completely reduced. At this point a 6 French 25 cm sheath was advanced over the guidewire into the common bile duct and directed toward the ampulla. The introducer was then removed and the angled glide catheter was advanced over the guidewire into the jejunum. The guidewire was then removed and exchanged for an Amplatz wire. The sheath and catheter were then completely removed leaving the guidewire in position. An 8 [place] by 40 cm skater biliary drain was advanced over the guidewire and coiled within the duodenal. Contrast was again injected demonstrating final position. Retention suture was applied. Sterile dressing applied. Catheter was connected to gravity drainage. IMPRESSION: Satisfactory reduction of the extra capsular loop and a advancement of the distal tip of the catheter into the bowel completing the internal/external biliary drain placement in a good position for full function. The catheter will remain connected to gravity drainage. The patient is being admitted for tachycardia, worsening renal function and likely underlying  sepsis. IR will continue to monitor bilirubin and drain function. Plan for capping once bilirubin has normalized. The patient had previous difficulty with the internal external drain care and may need extra education and support. Short-term follow-up in the clinic after discharge would likely be beneficial. The patient will be scheduled for a 4 week return for drain change and evaluation as well. Electronically Signed   By: Cordella Banner   On: 06/29/2024 14:14   IR EXCHANGE BILIARY DRAIN Result Date: 06/22/2024 INDICATION: Status post percutaneous biliary drainage procedure with placement of 10 French internal/external biliary drainage catheter via right lobe percutaneous biliary access on 06/09/2024. There is significant leakage of bile around the tube exit site currently. EXAM: EXCHANGE OF BILIARY DRAINAGE CATHETER UNDER FLUOROSCOPY INCLUDING CHOLANGIOGRAM MEDICATIONS: None ANESTHESIA/SEDATION: None FLUOROSCOPY: Radiation Exposure Index (as provided by the fluoroscopic device): 541 mGy Kerma CONTRAST:  25 mL Omnipaque  300 COMPLICATIONS: None immediate. PROCEDURE: Informed written consent was obtained from the patient after a thorough discussion of the procedural risks, benefits and alternatives. All questions were addressed. Maximal Sterile Barrier Technique was utilized including caps, mask, sterile gowns, sterile gloves, sterile drape, hand hygiene and skin antiseptic. A timeout was performed prior to the initiation of the procedure. Initial fluoroscopy was performed of the indwelling biliary drainage catheter. The exiting drain was injected with contrast. The drainage catheter was then removed over a guidewire and a 5 French catheter advanced over the wire. Contrast was injected via the catheter to opacify the drain tract. The catheter was then advanced over a hydrophilic guidewire into right intrahepatic bile ducts followed by the common bile duct. The 5 French catheter was further advanced to the level  of the duodenum and a guidewire advanced into the duodenum. A 10 French internal/external biliary drainage catheter was attempted to be advanced over various guidewires. Ultimately, a 10 [place] multipurpose drainage catheter was advanced over a guidewire and partially formed. The catheter was injected with contrast to confirm final positioning and  attached to a gravity drainage bag. The catheter was secured at the skin exit site with a Prolene retention suture and StatLock device. FINDINGS: Initial fluoroscopy demonstrates severe retraction of the previously placed internal/external biliary drain nearly out of the liver with some drainage side holes located outside of the costal margin. Injection of contrast barely opacifies the liver. A 5 French catheter was placed and contrast injection did opacify a tract back into right intrahepatic bile ducts. There is a persistent redundant and tortuous tract peripheral to bile ducts outside of the liver. 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 Despite access through to the level of the duodenum, a 10 [place] internal/external biliary drain could not be fully advanced into the duodenum due to buckling of guidewire and catheter outside of the liver which prevented the drain to cross the distal CBD stricture. Ultimately, a standard 10 [place] multipurpose drain was advanced with sideholes predominantly in right-sided intrahepatic ducts and the distal portion of the catheter extending just to the origin of the common bile duct. This drain is draining bile and will be left to gravity drainage. As this drain will not be adequate for long-term drainage, the patient will be brought back for either re-attempted conversion to an internal/external biliary drain or new biliary drain placement via new percutaneous biliary access under moderate IV  conscious sedation. IMPRESSION: 1. Previously placed internal/external biliary drainage catheter had retracted nearly out of the liver and bile ducts. 2. Tract back into right intrahepatic bile ducts and across the CBD was not able to be recanalized by a 5 French catheter to the level of the duodenum. 3. A 10 French internal/external biliary drainage catheter could not be advanced into the duodenum due to buckling of guidewire and catheter outside of the liver, per venting the drain to cross the CBD. 4. A standard 10 [place] multipurpose drain was advanced into right-sided intrahepatic ducts and just to the level of the origin of the common bile duct. This will be left to external gravity drainage. 5. The patient will be brought back for re-attempted conversion versus new percutaneous biliary drainage catheter placement under moderate IV conscious sedation. Electronically Signed   By: Marcey Moan M.D.   On: 06/22/2024 15:54    PERFORMANCE STATUS (ECOG) : 4 - Bedbound  Review of Systems Unless otherwise noted, a complete review of systems is negative.  Physical Exam General: NAD Cardiovascular: regular rate and rhythm Pulmonary: clear ant fields Abdomen: soft, nontender, + bowel sounds GU: no suprapubic tenderness Extremities: no edema, no joint deformities Skin: no rashes Neurological: Weakness but otherwise nonfocal  IMPRESSION: Follow-up visit.  We get notes reviewed.  Patient remains weak.  No significant improvement in renal function.  Oral intake remains poor with patient only taking bites and sips.  She reports some improvement in nausea.  In light of poor performance status and impaired renal function, likely cancer treatment options would be limited.  We discussed possible option of foregoing biopsy and pursuing hospice involvement.  Patient's husband will be at the hospital later today for further discussion.  Symptomatically, patient complains of heartburn, nausea, and poor oral  intake.  Will rotate from mirtazapine  to olanzapine , which may help with nausea and appetite.  Will also start on PPI.  PLAN: - Recommend best supportive care - Husband coming to the hospital later today to  discuss goals - DC mirtazapine  - Start olanzapine  10 mg nightly - Start pantoprazole  40 mg daily  Case and plan discussed with Dr. Melanee and Dr. Leesa   Time Total: 25 minutes  Visit consisted of counseling and education dealing with the complex and emotionally intense issues of symptom management and palliative care in the setting of serious and potentially life-threatening illness.Greater than 50%  of this time was spent counseling and coordinating care related to the above assessment and plan.  Signed by: Fonda Mower, PhD, NP-C

## 2024-07-19 NOTE — Consult Note (Signed)
 Hematology/Oncology Consult note [facility] Telephone:([phone] [phone]  Patient Care Team: Entzminger, Ethridge LABOR, MD as PCP - General (Internal Medicine) Darron Deatrice LABOR, MD as PCP - Cardiology (Cardiology) Luke Elsie GRADE, MD as PCP - Hematology/Oncology (Oncology) Kennyth Chew, MD as PCP - Electrophysiology (Cardiology) Melanee Annah BROCKS, MD as Consulting Physician (Oncology) Maurie Rayfield BIRCH, RN as Oncology Nurse Navigator   Name of the patient: Alyssa Leonard  [id number]  [birth year]    Reason for referral- ***   Referring physician- ***  Date of visit: @TODAY @   History of presenting illness- ***  ECOG PS- ***  Pain scale- ***   Review of systems- ROS  Allergies  Allergen Reactions   Latex Other (See Comments), Dermatitis, Hives and Swelling    unknown  Other reaction(s): Other (See Comments)  unknown  Other reaction(s): Other (See Comments) unknown    unknown   Aspirin Other (See Comments) and Nausea And Vomiting    Makes stomach burn  Other reaction(s): Other (See Comments)  Makes stomach burn  Makes stomach burn    Other reaction(s): Other (See Comments) Makes stomach burn   Plasticized Base [Plastibase]     Patient Active Problem List   Diagnosis Date Noted   Palliative care encounter 07/15/2024   Neoplasm related pain 07/13/2024   AKI (acute kidney injury) 07/13/2024   Sepsis (HCC) 06/29/2024   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) 06/09/2024   Mass of head of pancreas 06/09/2024   Paroxysmal atrial flutter (HCC) 03/27/2024   Left foot pain 03/26/2024   Hypokalemia 03/26/2024   Morbid obesity with BMI of 50.0-59.9, adult (HCC) 03/26/2024   OSA (obstructive sleep apnea) 03/26/2024   Asthma, chronic 03/26/2024   Cellulitis of left foot 03/26/2024   Renal cell carcinoma (HCC)    Hypertension    GERD (gastroesophageal reflux disease)    Diabetes mellitus without complication (HCC)     COPD (chronic obstructive pulmonary disease) (HCC)      Past Medical History:  Diagnosis Date   Asthma    Atrial flutter (HCC)    COPD (chronic obstructive pulmonary disease) (HCC)    Diabetes mellitus without complication (HCC)    GERD (gastroesophageal reflux disease)    History of colon polyps    Hypertension    Meniere disease    OSA (obstructive sleep apnea)    Renal cell carcinoma (HCC)    Sleep apnea    Went for sleep study test in January 2018 I haven't heard back from test     Past Surgical History:  Procedure Laterality Date   ABDOMINAL HYSTERECTOMY     BARIATRIC SURGERY     CHOLECYSTECTOMY     COLONOSCOPY WITH PROPOFOL  N/A 12/11/2016   Procedure: COLONOSCOPY WITH PROPOFOL ;  Surgeon: Lamar ONEIDA Holmes, MD;  Location: [facility] ENDOSCOPY;  Service: Endoscopy;  Laterality: N/A;   COLONOSCOPY WITH PROPOFOL  N/A 11/27/2021   Procedure: COLONOSCOPY WITH PROPOFOL ;  Surgeon: Maryruth Ole ONEIDA, MD;  Location: [facility] ENDOSCOPY;  Service: Endoscopy;  Laterality: N/A;   IR BILIARY STENT(S) EXIST ACCESS INC DILAT CATH EXCH/REMOVAL  07/16/2024   IR CONVERT BILIARY DRAIN TO INT EXT BILIARY DRAIN  06/29/2024   IR EXCHANGE BILIARY DRAIN  06/22/2024   IR INT EXT BILIARY DRAIN WITH CHOLANGIOGRAM  06/09/2024   IR RADIOLOGIST EVAL & MGMT  06/15/2024    Social History   Socioeconomic History   Marital status: Married    Spouse name: Not on file   Number  of children: Not on file   Years of education: Not on file   Highest education level: Not on file  Occupational History   Not on file  Tobacco Use   Smoking status: Former    Current packs/day: 0.00    Types: Cigarettes    Quit date: 11/14/1993    Years since quitting: 30.6   Smokeless tobacco: Never  Vaping Use   Vaping status: Never Used  Substance and Sexual Activity   Alcohol use: No   Drug use: No   Sexual activity: Not Currently  Other Topics Concern   Not on file  Social History Narrative   Not on file   Social Drivers  of Health   Financial Resource Strain: Low Risk  (06/01/2024)   Received from [facility]   Overall Financial Resource Strain (CARDIA)    Difficulty of Paying Living Expenses: Not hard at all  Food Insecurity: No Food Insecurity (07/14/2024)   Hunger Vital Sign    Worried About Running Out of Food in the Last Year: Never true    Ran Out of Food in the Last Year: Never true  Transportation Needs: No Transportation Needs (07/14/2024)   PRAPARE - Administrator, Civil Service (Medical): No    Lack of Transportation (Non-Medical): No  Physical Activity: Not on file  Stress: Not on file  Social Connections: Socially Integrated (07/14/2024)   Social Connection and Isolation Panel    Frequency of Communication with Friends and Family: More than three times a week    Frequency of Social Gatherings with Friends and Family: More than three times a week    Attends Religious Services: 1 to 4 times per year    Active Member of [employer] or Organizations: No    Attends Engineer, structural: More than 4 times per year    Marital Status: Married  Catering manager Violence: Not At Risk (07/14/2024)   Humiliation, Afraid, Rape, and Kick questionnaire    Fear of Current or Ex-Partner: No    Emotionally Abused: No    Physically Abused: No    Sexually Abused: No     Family History  Problem Relation Age of Onset   Hypertension Mother    Kidney disease Sister    Kidney disease Brother    Heart disease Brother    Heart disease Brother    Breast cancer Neg Hx      Current Facility-Administered Medications:    albuterol  (PROVENTIL ) (2.5 MG/3ML) 0.083% nebulizer solution 2.5 mg, 2.5 mg, Nebulization, Q6H PRN, Dail Rankin RAMAN, RPH   amiodarone  (PACERONE ) tablet 200 mg, 200 mg, Oral, Daily, Cox, Amy N, DO, 200 mg at 07/19/24 1048   apixaban  (ELIQUIS ) tablet 5 mg, 5 mg, Oral, BID, Cox, Amy N, DO, 5 mg at 07/19/24 1049   bisacodyl  (DULCOLAX) EC tablet 5 mg, 5 mg, Oral,  Daily PRN, Cox, Amy N, DO   diazepam  (VALIUM ) tablet 2 mg, 2 mg, Oral, BID PRN, Cox, Amy N, DO   docusate sodium  (COLACE) capsule 100 mg, 100 mg, Oral, BID, Cox, Amy N, DO, 100 mg at 07/19/24 1049   famotidine  (PEPCID ) tablet 20 mg, 20 mg, Oral, BID BM & HS PRN, Dezii, Alexandra, DO, 20 mg at 07/19/24 1402   feeding supplement (ENSURE PLUS HIGH PROTEIN) liquid 237 mL, 237 mL, Oral, TID BM, Cox, Amy N, DO, 237 mL at 07/15/24 1041   feeding supplement (GLUCERNA SHAKE) (GLUCERNA SHAKE) liquid 237 mL, 237 mL, Oral,  TID BM, Dezii, Alexandra, DO, 237 mL at 07/19/24 1051   insulin  aspart (novoLOG ) injection 0-15 Units, 0-15 Units, Subcutaneous, TID WC, Cox, Amy N, DO, 2 Units at 07/19/24 1237   insulin  aspart (novoLOG ) injection 0-5 Units, 0-5 Units, Subcutaneous, QHS, Cox, Amy N, DO   lactated ringers  infusion, , Intravenous, Continuous, Breeze, [place], NP, Last Rate: 100 mL/hr at 07/19/24 1417, New Bag at 07/19/24 1417   lactulose  (CHRONULAC ) 10 GM/15ML solution 10 g, 10 g, Oral, TID, Dezii, Alexandra, DO, 10 g at 07/17/24 1056   lidocaine  (XYLOCAINE ) 1 % (with pres) injection 10 mL, 10 mL, Intradermal, Once, [name] Aran, MD   meclizine  (ANTIVERT ) tablet 25 mg, 25 mg, Oral, TID PRN, Cox, Amy N, DO   metoCLOPramide  (REGLAN ) injection 5 mg, 5 mg, Intravenous, Q8H, Dezii, Alexandra, DO, 5 mg at 07/19/24 1347   metoprolol  tartrate (LOPRESSOR ) tablet 25 mg, 25 mg, Oral, BID, Cox, Amy N, DO, 25 mg at 07/19/24 1049   OLANZapine  (ZYPREXA ) tablet 10 mg, 10 mg, Oral, QHS, Borders, Joshua R, NP   oxyCODONE  (Oxy IR/ROXICODONE ) immediate release tablet 5 mg, 5 mg, Oral, Q8H PRN, Cox, Amy N, DO, 5 mg at 07/19/24 1347   pantoprazole  (PROTONIX ) EC tablet 40 mg, 40 mg, Oral, Daily, Borders, Fonda R, NP, 40 mg at 07/19/24 1049   senna-docusate (Senokot-S) tablet 1 tablet, 1 tablet, Oral, QHS PRN, Cox, Amy N, DO   sodium chloride  flush (NS) 0.9 % injection 10-40 mL, 10-40 mL, Intracatheter, Q12H, Dezii, Alexandra,  DO, 10 mL at 07/19/24 1238   sodium chloride  flush (NS) 0.9 % injection 10-40 mL, 10-40 mL, Intracatheter, PRN, Leesa Kast, DO   Physical exam:  Vitals:   07/18/24 2111 07/19/24 0531 07/19/24 0800 07/19/24 1046  BP: 118/60 102/62 115/68 120/73  Pulse: 100 88 68 92  Resp: 18 18 17    Temp: 98.2 F (36.8 C) 98 F (36.7 C) 98 F (36.7 C)   TempSrc: Oral Oral Oral   SpO2: 100% 100% 100% 100%  Weight:      Height:       Physical Exam        Latest Ref Rng & Units 07/19/2024    4:16 AM  CMP  Glucose 70 - 99 mg/dL 894   BUN 8 - 23 mg/dL 43   Creatinine 9.55 - 1.00 mg/dL 7.27   Sodium 864 - 854 mmol/L 138   Potassium 3.5 - 5.1 mmol/L 4.1   Chloride 98 - 111 mmol/L 111   CO2 22 - 32 mmol/L 20   Calcium  8.9 - 10.3 mg/dL 8.3   Total Protein 6.5 - 8.1 g/dL 5.4   Total Bilirubin 0.0 - 1.2 mg/dL 1.0   Alkaline Phos 38 - 126 U/L 100   AST 15 - 41 U/L 17   ALT 0 - 44 U/L 17       Latest Ref Rng & Units 07/19/2024    4:16 AM  CBC  WBC 4.0 - 10.5 K/uL 12.9   Hemoglobin 12.0 - 15.0 g/dL 8.8   Hematocrit 63.9 - 46.0 % 26.3   Platelets 150 - 400 K/uL 153     @IMAGES @  IR BILIARY STENT(S) EXIST ACCESS INC DILAT CATH EXCH/REMOVAL Result Date: 07/16/2024 INDICATION: Common bile duct obstruction secondary to pancreatic mass and status post previous placement of internal/external biliary drain. Bilirubin has now normalized and the patient presents for possible internalization of biliary stenting via percutaneous approach. EXAM: BILIARY STENT PLACEMENT VIA PRE-EXISTING INTERNAL/EXTERNAL DRAIN APPROACH INCLUDING  CHOLANGIOGRAM MEDICATIONS: 2 g IV cefoxitin ; The antibiotic was administered within an appropriate time frame prior to the initiation of the procedure. ANESTHESIA/SEDATION: Moderate (conscious) sedation was employed during this procedure. A total of Versed  3.0 mg and Fentanyl  125 mcg was administered intravenously by the radiology nurse. Total intra-service moderate Sedation  Time: 51 minutes. The patient's level of consciousness and vital signs were monitored continuously by radiology nursing throughout the procedure under my direct supervision. FLUOROSCOPY: Radiation Exposure Index (as provided by the fluoroscopic device): 469 mGy Kerma COMPLICATIONS: None immediate. PROCEDURE: Informed written consent was obtained from the patient after a thorough discussion of the procedural risks, benefits and alternatives. All questions were addressed. Maximal Sterile Barrier Technique was utilized including caps, mask, sterile gowns, sterile gloves, sterile drape, hand hygiene and skin antiseptic. A timeout was performed prior to the initiation of the procedure. Initial cholangiogram was performed through the pre-existing internal/external biliary drainage catheter. A radiopaque stent guided ruler had been placed along the patient's back in order to guide measurement assessment for stent placement. The drain was then cut and removed over a guidewire. A 7 French sheath was advanced over the wire to the level of the duodenum. Additional contrast and air was injected to outline the duodenal lumen. The sheath was retracted across the common bile duct in order to define length of common bile duct stricture/obstruction. A 10 mm x 60 mm Wallflex covered biliary stent prosthesis was chosen for placement. The covered stent deployment system was advanced over a guidewire to the level of the duodenum and retracted into appropriate position. The stent was then deployed across the common bile duct. After stent deployment, the stent lumen was additionally dilated with an 8 mm x 40 mm Athletis balloon. Balloon dilatation was performed in 2 different segments in order to cover the stented segment. The balloon was deflated and removed. Additional cholangiogram was then performed through the 7 French sheath positioned at the confluence of right and left intrahepatic bile ducts as well as in the right intrahepatic  bile ducts. Percutaneous right-sided biliary access was then removed with removal of guidewire and sheath. A gauze dressing was applied over the percutaneous access site. 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 Intrahepatic bile ducts are relatively decompressed. Based on length of estimated stricture, a 10 mm x 60 mm covered self expanding biliary stent was chosen for placement. After stent deployment, due to persistent narrowing of the stent in its midportion, the stent was further post dilated to 8 mm resulting in improved patency. The stent is well positioned extending from just below the biliary confluence of right and left intrahepatic ducts to the level of the duodenum. Excellent antegrade flow is documented through the stent after placement which allowed complete removal of percutaneous access on completion. 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 The covered stent was additionally post dilated with an 8 mm balloon and demonstrates excellent patency extending from just below the biliary confluence to the duodenum with widely patent flow demonstrated after stent placement. Percutaneous biliary access was removed upon completion of the procedure. Electronically Signed   By: Marcey Moan M.D.   On: 07/16/2024 16:02   DG Abd 2 Views Result Date: 07/01/2024 CLINICAL DATA:  Constipation. EXAM: ABDOMEN - 2 VIEW COMPARISON:  CT 06/08/2024 FINDINGS:  No bowel dilatation or evidence of obstruction. Air and small volume of stool throughout nondilated colon. Small volume of stool in the rectum. Multiple surgical clips in the left upper quadrant. A few surgical clips in the pelvis. Right upper quadrant biliary stent. No free intra-abdominal air.  IMPRESSION: Small volume formed stool in the colon. No bowel obstruction. Electronically Signed   By: Andrea Gasman M.D.   On: 07/01/2024 00:03   IR CONVERT BILIARY DRAIN TO INT EXT BILIARY DRAIN Result Date: 06/29/2024 INDICATION: Biliary obstruction in a patient with an indwelling biliary drain previously placed 06/09/2024. On 06/22/2024, the patient returned for further evaluation to find that the catheter had partially retracted internally with a redundant loop of the 10 [place] biliary drain extra capsular to the liver. Despite multiple attempts to gain access across the common bile duct obstruction, the biliary drainage catheter would not track across this obstruction and a drain was left in the common bile duct. The patient returns today for further evaluation and potential new access or converting the catheter to a better positioned internal/external biliary drainage catheter EXAM: Drain exchange COMPARISON:  None Available. ANESTHESIA/SEDATION: Moderate (conscious) sedation was employed during this procedure. A total of Versed  3 mg and Fentanyl  200 mcg was administered intravenously by the radiology nurse. Total intra-service moderate Sedation Time: 30 minutes. The patient's level of consciousness and vital signs were monitored continuously by radiology nursing throughout the procedure under my direct supervision. CONTRAST:  25 mL Omnipaque  300-administered into the collecting system(s) FLUOROSCOPY: Radiation Exposure Index (as provided by the fluoroscopic device): 406 mGy Kerma COMPLICATIONS: None immediate. PROCEDURE: Informed written consent was obtained from the patient after a thorough discussion of the procedural risks, benefits and alternatives. All questions were addressed. Maximal Sterile Barrier Technique was utilized including caps, mask, sterile gowns, sterile gloves, sterile drape, hand hygiene and skin antiseptic. A timeout was performed prior to the initiation of the procedure. In a  supine position, the right upper quadrant and external portion of the catheter were prepped and draped in usual sterile fashion. Dilute contrast was injected into the indwelling biliary drain filling the right lobe bile ducts as well as the common bile duct. Common bile duct is markedly dilated with moderate dilatation of the intrahepatic ducts. Multiple attempts were then made to advance a guidewire through the pigtail catheter and out through the ampulla into the duodenal without success. The pigtail catheter was cut and removed over a Bentson guidewire. An angled glide catheter was then advanced over the Bentson and attempts at reducing the redundant extra capsular loop were unsuccessful. The catheter was then directed toward the ampulla within the common bile duct and using a combination of Glidewire and Bentson guidewire finally gaining access into the duodenum. Once duodenal access was obtained, contrast was injected verifying intraluminal position within the bowel. The Bentson guidewire was then advanced into the jejunum until enough guidewire was demonstrated to be looping redundantly within the jejunum giving a long length of guidewire position within the small bowel. The glide catheter was then withdrawn. Slow gentle traction was then applied to the guidewire with minimal reduction in the redundant extra capsular loop. Rapid and somewhat forceful traction was then applied to the guidewire and the redundant loop was completely reduced. At this point a 6 French 25 cm sheath was advanced over the guidewire into the common bile duct and directed toward the ampulla. The introducer was then removed and the angled glide catheter was advanced over the guidewire into the  jejunum. The guidewire was then removed and exchanged for an Amplatz wire. The sheath and catheter were then completely removed leaving the guidewire in position. An 8 [place] by 40 cm skater biliary drain was advanced over the guidewire and coiled  within the duodenal. Contrast was again injected demonstrating final position. Retention suture was applied. Sterile dressing applied. Catheter was connected to gravity drainage. IMPRESSION: Satisfactory reduction of the extra capsular loop and a advancement of the distal tip of the catheter into the bowel completing the internal/external biliary drain placement in a good position for full function. The catheter will remain connected to gravity drainage. The patient is being admitted for tachycardia, worsening renal function and likely underlying sepsis. IR will continue to monitor bilirubin and drain function. Plan for capping once bilirubin has normalized. The patient had previous difficulty with the internal external drain care and may need extra education and support. Short-term follow-up in the clinic after discharge would likely be beneficial. The patient will be scheduled for a 4 week return for drain change and evaluation as well. Electronically Signed   By: Cordella Banner   On: 06/29/2024 14:14   IR EXCHANGE BILIARY DRAIN Result Date: 06/22/2024 INDICATION: Status post percutaneous biliary drainage procedure with placement of 10 French internal/external biliary drainage catheter via right lobe percutaneous biliary access on 06/09/2024. There is significant leakage of bile around the tube exit site currently. EXAM: EXCHANGE OF BILIARY DRAINAGE CATHETER UNDER FLUOROSCOPY INCLUDING CHOLANGIOGRAM MEDICATIONS: None ANESTHESIA/SEDATION: None FLUOROSCOPY: Radiation Exposure Index (as provided by the fluoroscopic device): 541 mGy Kerma CONTRAST:  25 mL Omnipaque  300 COMPLICATIONS: None immediate. PROCEDURE: Informed written consent was obtained from the patient after a thorough discussion of the procedural risks, benefits and alternatives. All questions were addressed. Maximal Sterile Barrier Technique was utilized including caps, mask, sterile gowns, sterile gloves, sterile drape, hand hygiene and skin  antiseptic. A timeout was performed prior to the initiation of the procedure. Initial fluoroscopy was performed of the indwelling biliary drainage catheter. The exiting drain was injected with contrast. The drainage catheter was then removed over a guidewire and a 5 French catheter advanced over the wire. Contrast was injected via the catheter to opacify the drain tract. The catheter was then advanced over a hydrophilic guidewire into right intrahepatic bile ducts followed by the common bile duct. The 5 French catheter was further advanced to the level of the duodenum and a guidewire advanced into the duodenum. A 10 French internal/external biliary drainage catheter was attempted to be advanced over various guidewires. Ultimately, a 10 [place] multipurpose drainage catheter was advanced over a guidewire and partially formed. The catheter was injected with contrast to confirm final positioning and attached to a gravity drainage bag. The catheter was secured at the skin exit site with a Prolene retention suture and StatLock device. FINDINGS: Initial fluoroscopy demonstrates severe retraction of the previously placed internal/external biliary drain nearly out of the liver with some drainage side holes located outside of the costal margin. Injection of contrast barely opacifies the liver. A 5 French catheter was placed and contrast injection did opacify a tract back into right intrahepatic bile ducts. There is a persistent redundant and tortuous tract peripheral to bile ducts outside of the liver. 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 Despite access through to the level of the duodenum, a 10 [place] internal/external  biliary drain could not be fully advanced into the duodenum due to buckling of guidewire and catheter outside of the liver which prevented the drain to cross the  distal CBD stricture. Ultimately, a standard 10 [place] multipurpose drain was advanced with sideholes predominantly in right-sided intrahepatic ducts and the distal portion of the catheter extending just to the origin of the common bile duct. This drain is draining bile and will be left to gravity drainage. As this drain will not be adequate for long-term drainage, the patient will be brought back for either re-attempted conversion to an internal/external biliary drain or new biliary drain placement via new percutaneous biliary access under moderate IV conscious sedation. IMPRESSION: 1. Previously placed internal/external biliary drainage catheter had retracted nearly out of the liver and bile ducts. 2. Tract back into right intrahepatic bile ducts and across the CBD was not able to be recanalized by a 5 French catheter to the level of the duodenum. 3. A 10 French internal/external biliary drainage catheter could not be advanced into the duodenum due to buckling of guidewire and catheter outside of the liver, per venting the drain to cross the CBD. 4. A standard 10 [place] multipurpose drain was advanced into right-sided intrahepatic ducts and just to the level of the origin of the common bile duct. This will be left to external gravity drainage. 5. The patient will be brought back for re-attempted conversion versus new percutaneous biliary drainage catheter placement under moderate IV conscious sedation. Electronically Signed   By: Marcey Moan M.D.   On: 06/22/2024 15:54    Assessment and plan- Patient is a 68 y.o. female ***   Thank you for this kind referral and the opportunity to participate in the care of this  Patient   Visit Diagnosis 1. AKI (acute kidney injury)   2. Dehydration   3. Lack of appetite   4. Mass of head of pancreas     Dr. Annah Skene, MD, MPH [facility] [phone] 07/19/2024

## 2024-07-19 NOTE — Consult Note (Signed)
 [facility]  CONSULT NOTE    Date: 07/19/2024                  Patient Name:  Alyssa Leonard  MRN: [id number]  DOB: 30-Oct-1955  Age / Sex: 68 y.o., female         PCP: [name] LABOR, MD                 Service Requesting Consult: TRH                 Reason for Consult: Acute kidney injury            History of Present Illness: Alyssa Leonard is a 68 y.o.  female with past medical history of hypertension, GERD, OSA, obesity, pAfib, renal cell carcinoma, and malignant neoplasm of the pancreas, who was admitted to [facility] on 07/13/2024 for Dehydration [E86.0] Lack of appetite [R63.0] AKI (acute kidney injury) (HCC) [N17.9]  Patient presents to ED with abnormal labs work. 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 Patient seen resting in bed. Currently eating an [place]. States she does not have an appetite and hasn't for a few days now. She is completely alert and oriented. Denies shortness of breath, room air.   Labs on ED concerning for creatinine 3.06 with GFR 16. Baseline creatinine 1.33 with GFR 44 earlier this month. Creatinine has shown some improvement since admission. No imaging or urine studies. Outpatient MRCP in July shows pancreatic mass.    Medications: Outpatient medications: Medications Prior to Admission  Medication Sig Dispense Refill Last Dose/Taking   acetaminophen  (TYLENOL ) 500 MG tablet Take 1,000 mg by mouth every 6 (six) hours as needed for mild pain (pain score 1-3).   Past Week   albuterol  (PROVENTIL  HFA;VENTOLIN  HFA) 108 (90 Base) MCG/ACT inhaler Inhale 2 puffs into the lungs every 6 (six) hours as needed for wheezing or shortness of breath.   Unknown   albuterol  (PROVENTIL ) (2.5 MG/3ML) 0.083% nebulizer solution Take 2.5 mg by nebulization every 4 (four) hours as needed for wheezing or shortness of breath.   Past Week   amiodarone  (PACERONE ) 200 MG tablet Take 1  tablet (200 mg total) by mouth daily. 90 tablet 3 07/12/2024 at 11:00 AM   diazepam  (VALIUM ) 2 MG tablet Take 1 tablet (2 mg total) by mouth 2 (two) times daily as needed for anxiety. Home med.   Past Month   dibucaine (NUPERCAINAL) 1 % OINT Place 1 Application rectally as needed for hemorrhoids.   Unknown   dorzolamide  (TRUSOPT ) 2 % ophthalmic solution Place 1 drop into both eyes 2 (two) times daily.   07/13/2024 Morning   famotidine  (PEPCID ) 40 MG tablet Take 40 mg by mouth at bedtime.   07/12/2024 Bedtime   HYDROmorphone  (DILAUDID ) 2 MG tablet Take 2 mg by mouth every 6 (six) hours as needed for moderate pain (pain score 4-6).   07/12/2024 Evening   latanoprost  (XALATAN ) 0.005 % ophthalmic solution 1 drop at bedtime.   07/12/2024 Bedtime   loperamide  (IMODIUM ) 1 MG/5ML solution Take 1 mg by mouth daily as needed for diarrhea or loose stools.   Unknown   losartan -hydrochlorothiazide  (HYZAAR) 50-12.5 MG tablet Take 1 tablet by mouth daily.   07/12/2024 Morning   meclizine  (ANTIVERT ) 25 MG tablet Take 25 mg by mouth 3 (three) times daily as needed for dizziness.   Past Week   metoprolol  tartrate (LOPRESSOR ) 25  MG tablet Take 0.5 tablets (12.5 mg total) by mouth 2 (two) times daily. 15 tablet 2 07/12/2024 Evening   omeprazole 20 MG TBDD disintegrating tablet 20 mg 2 (two) times daily before a meal.   07/12/2024 Evening   ondansetron  (ZOFRAN ) 4 MG tablet Take 4 mg by mouth every 8 (eight) hours as needed for nausea or vomiting.   Unknown   oxyCODONE  (OXY IR/ROXICODONE ) 5 MG immediate release tablet Take 1 tablet (5 mg total) by mouth every 8 (eight) hours as needed for moderate pain (pain score 4-6). 30 tablet 0 07/13/2024 at  1:00 PM   potassium chloride  (KLOR-CON ) 10 MEQ tablet Take 10 mEq by mouth daily.   07/12/2024 Morning   sodium chloride  flush 0.9 % SOLN injection Flush biliary cathter with 5-10mL saline daily to prevent clogging 300 mL 1 Unknown   Testosterone 12.5 MG/ACT (1%) GEL Apply 12.5 mg  topically daily.   Past Week   apixaban  (ELIQUIS ) 5 MG TABS tablet Take 1 tablet (5 mg total) by mouth 2 (two) times daily. (Patient not taking: Reported on 07/13/2024) 60 tablet 3 Not Taking   feeding supplement (ENSURE PLUS HIGH PROTEIN) LIQD Take 237 mLs by mouth 3 (three) times daily between meals. (Patient not taking: Reported on 07/13/2024) 237 mL 0 Not Taking   losartan  (COZAAR ) 25 MG tablet Take 1 tablet (25 mg total) by mouth daily. (Patient not taking: Reported on 07/13/2024) 30 tablet 2 Not Taking   mirtazapine  (REMERON ) 7.5 MG tablet Take 1 tablet (7.5 mg total) by mouth at bedtime. (Patient not taking: Reported on 07/13/2024) 30 tablet 0 Not Taking    Current medications: Current Facility-Administered Medications  Medication Dose Route Frequency Provider Last Rate Last Admin   albuterol  (PROVENTIL ) (2.5 MG/3ML) 0.083% nebulizer solution 2.5 mg  2.5 mg Nebulization Q6H PRN Dail Rankin RAMAN, RPH       amiodarone  (PACERONE ) tablet 200 mg  200 mg Oral Daily Cox, Amy N, DO   200 mg at 07/19/24 1048   apixaban  (ELIQUIS ) tablet 5 mg  5 mg Oral BID Cox, Amy N, DO   5 mg at 07/19/24 1049   bisacodyl  (DULCOLAX) EC tablet 5 mg  5 mg Oral Daily PRN Cox, Amy N, DO       diazepam  (VALIUM ) tablet 2 mg  2 mg Oral BID PRN Cox, Amy N, DO       docusate sodium  (COLACE) capsule 100 mg  100 mg Oral BID Cox, Amy N, DO   100 mg at 07/19/24 1049   famotidine  (PEPCID ) tablet 20 mg  20 mg Oral BID BM & HS PRN Dezii, Alexandra, DO   20 mg at 07/14/24 1026   feeding supplement (ENSURE PLUS HIGH PROTEIN) liquid 237 mL  237 mL Oral TID BM Cox, Amy N, DO   237 mL at 07/15/24 1041   feeding supplement (GLUCERNA SHAKE) (GLUCERNA SHAKE) liquid 237 mL  237 mL Oral TID BM Dezii, Alexandra, DO   237 mL at 07/19/24 1051   insulin  aspart (novoLOG ) injection 0-15 Units  0-15 Units Subcutaneous TID WC Cox, Amy N, DO   2 Units at 07/18/24 0808   insulin  aspart (novoLOG ) injection 0-5 Units  0-5 Units Subcutaneous QHS Cox, Amy N,  DO       lactulose  (CHRONULAC ) 10 GM/15ML solution 10 g  10 g Oral TID Dezii, Alexandra, DO   10 g at 07/17/24 1056   lidocaine  (XYLOCAINE ) 1 % (with pres) injection 10 mL  10 mL  Intradermal Once [name] Aran, MD       meclizine  (ANTIVERT ) tablet 25 mg  25 mg Oral TID PRN Cox, Amy N, DO       metoCLOPramide  (REGLAN ) injection 5 mg  5 mg Intravenous Q8H Dezii, Alexandra, DO   5 mg at 07/19/24 0525   metoprolol  tartrate (LOPRESSOR ) tablet 25 mg  25 mg Oral BID Cox, Amy N, DO   25 mg at 07/19/24 1049   OLANZapine  (ZYPREXA ) tablet 10 mg  10 mg Oral QHS Borders, Joshua R, NP       oxyCODONE  (Oxy IR/ROXICODONE ) immediate release tablet 5 mg  5 mg Oral Q8H PRN Cox, Amy N, DO   5 mg at 07/19/24 0526   pantoprazole  (PROTONIX ) EC tablet 40 mg  40 mg Oral Daily Borders, Joshua R, NP   40 mg at 07/19/24 1049   senna-docusate (Senokot-S) tablet 1 tablet  1 tablet Oral QHS PRN Cox, Amy N, DO       sodium chloride  flush (NS) 0.9 % injection 10-40 mL  10-40 mL Intracatheter Q12H Dezii, Alexandra, DO   10 mL at 07/18/24 2125   sodium chloride  flush (NS) 0.9 % injection 10-40 mL  10-40 mL Intracatheter PRN Dezii, Lorane, DO          Allergies: Allergies  Allergen Reactions   Latex Other (See Comments), Dermatitis, Hives and Swelling    unknown  Other reaction(s): Other (See Comments)  unknown  Other reaction(s): Other (See Comments) unknown    unknown   Aspirin Other (See Comments) and Nausea And Vomiting    Makes stomach burn  Other reaction(s): Other (See Comments)  Makes stomach burn  Makes stomach burn    Other reaction(s): Other (See Comments) Makes stomach burn   Plasticized Base [Plastibase]       Past Medical History: Past Medical History:  Diagnosis Date   Asthma    Atrial flutter (HCC)    COPD (chronic obstructive pulmonary disease) (HCC)    Diabetes mellitus without complication (HCC)    GERD (gastroesophageal reflux disease)    History of colon polyps    Hypertension     Meniere disease    OSA (obstructive sleep apnea)    Renal cell carcinoma (HCC)    Sleep apnea    Went for sleep study test in January 2018 I haven't heard back from test     Past Surgical History: Past Surgical History:  Procedure Laterality Date   ABDOMINAL HYSTERECTOMY     BARIATRIC SURGERY     CHOLECYSTECTOMY     COLONOSCOPY WITH PROPOFOL  N/A 12/11/2016   Procedure: COLONOSCOPY WITH PROPOFOL ;  Surgeon: Lamar ONEIDA Holmes, MD;  Location[facility] ENDOSCOPY;  Service: Endoscopy;  Laterality: N/A;   COLONOSCOPY WITH PROPOFOL  N/A 11/27/2021   Procedure: COLONOSCOPY WITH PROPOFOL ;  Surgeon: Maryruth Ole ONEIDA, MD;  Location: [facility] ENDOSCOPY;  Service: Endoscopy;  Laterality: N/A;   IR BILIARY STENT(S) EXIST ACCESS INC DILAT CATH EXCH/REMOVAL  07/16/2024   IR CONVERT BILIARY DRAIN TO INT EXT BILIARY DRAIN  06/29/2024   IR EXCHANGE BILIARY DRAIN  06/22/2024   IR INT EXT BILIARY DRAIN WITH CHOLANGIOGRAM  06/09/2024   IR RADIOLOGIST EVAL & MGMT  06/15/2024     Family History: Family History  Problem Relation Age of Onset   Hypertension Mother    Kidney disease Sister    Kidney disease Brother    Heart disease Brother    Heart disease Brother    Breast cancer Neg Hx  Social History: Social History   Socioeconomic History   Marital status: Married    Spouse name: Not on file   Number of children: Not on file   Years of education: Not on file   Highest education level: Not on file  Occupational History   Not on file  Tobacco Use   Smoking status: Former    Current packs/day: 0.00    Types: Cigarettes    Quit date: 11/14/1993    Years since quitting: 30.6   Smokeless tobacco: Never  Vaping Use   Vaping status: Never Used  Substance and Sexual Activity   Alcohol use: No   Drug use: No   Sexual activity: Not Currently  Other Topics Concern   Not on file  Social History Narrative   Not on file   Social Drivers of Health   Financial Resource Strain: Low Risk   (06/01/2024)   Received from [facility]   Overall Financial Resource Strain (CARDIA)    Difficulty of Paying Living Expenses: Not hard at all  Food Insecurity: No Food Insecurity (07/14/2024)   Hunger Vital Sign    Worried About Running Out of Food in the Last Year: Never true    Ran Out of Food in the Last Year: Never true  Transportation Needs: No Transportation Needs (07/14/2024)   PRAPARE - Administrator, Civil Service (Medical): No    Lack of Transportation (Non-Medical): No  Physical Activity: Not on file  Stress: Not on file  Social Connections: Socially Integrated (07/14/2024)   Social Connection and Isolation Panel    Frequency of Communication with Friends and Family: More than three times a week    Frequency of Social Gatherings with Friends and Family: More than three times a week    Attends Religious Services: 1 to 4 times per year    Active Member of [employer] or Organizations: No    Attends Engineer, structural: More than 4 times per year    Marital Status: Married  Catering manager Violence: Not At Risk (07/14/2024)   Humiliation, Afraid, Rape, and Kick questionnaire    Fear of Current or Ex-Partner: No    Emotionally Abused: No    Physically Abused: No    Sexually Abused: No     Review of Systems: Review of Systems  Constitutional:  Negative for chills, fever and malaise/fatigue.       Abnormal labs  HENT:  Negative for congestion, sore throat and tinnitus.   Eyes:  Negative for blurred vision and redness.  Respiratory:  Negative for cough, shortness of breath and wheezing.   Cardiovascular:  Negative for chest pain, palpitations, claudication and leg swelling.  Gastrointestinal:  Negative for abdominal pain, blood in stool, diarrhea, nausea and vomiting.  Genitourinary:  Negative for flank pain, frequency and hematuria.  Musculoskeletal:  Negative for back pain, falls and myalgias.  Skin:  Negative for rash.  Neurological:   Negative for dizziness, weakness and headaches.  Endo/Heme/Allergies:  Does not bruise/bleed easily.  Psychiatric/Behavioral:  Negative for depression. The patient is not nervous/anxious and does not have insomnia.     Vital Signs: Blood pressure 120/73, pulse 92, temperature 98 F (36.7 C), temperature source Oral, resp. rate 17, height 5' (1.524 m), weight 102 kg, SpO2 100%.  Weight trends: Filed Weights   07/13/24 1612  Weight: 102 kg    Physical Exam: General: NAD  Head: Normocephalic, atraumatic. Moist oral mucosal membranes  Eyes: Anicteric  Lungs:  Clear to auscultation, normal effort  Heart: Regular rate and rhythm  Abdomen:  Soft, nontender  Extremities:  No peripheral edema.  Neurologic: Alert  Skin: No lesions        Lab results: Basic Metabolic Panel: Recent Labs  Lab 07/16/24 0543 07/17/24 0319 07/18/24 0442 07/19/24 0416  NA 140 143 144 138  K 4.1 4.1 4.3 4.1  CL 112* 112* 115* 111  CO2 19* 22 21* 20*  GLUCOSE 117* 109* 116* 105*  BUN 36* 33* 35* 43*  CREATININE 2.31* 2.44* 2.60* 2.72*  CALCIUM  8.7* 8.7* 8.7* 8.3*  MG 2.0 2.0 1.8 1.8  PHOS 2.8 3.3 2.9 2.7    Liver Function Tests: Recent Labs  Lab 07/17/24 0319 07/18/24 0442 07/19/24 0416  AST 28 25 17   ALT 26 21 17   ALKPHOS 126 121 100  BILITOT 0.9 0.9 1.0  PROT 6.0* 5.5* 5.4*  ALBUMIN 2.5* 2.2* 2.1*   No results for input(s): LIPASE, AMYLASE in the last 168 hours. No results for input(s): AMMONIA in the last 168 hours.  CBC: Recent Labs  Lab 07/15/24 0848 07/16/24 0543 07/17/24 0319 07/18/24 0442 07/19/24 0416  WBC 5.9 6.6 6.9 16.4* 12.9*  NEUTROABS 3.3 3.9 4.5 13.3* 9.9*  HGB 10.4* 9.6* 10.0* 10.0* 8.8*  HCT 31.8* 30.3* 30.7* 30.0* 26.3*  MCV 84.4 86.6 85.0 84.0 84.6  PLT 190 186 181 171 153    Cardiac Enzymes: No results for input(s): CKTOTAL, CKMB, CKMBINDEX, TROPONINI in the last 168 hours.  BNP: Invalid input(s): POCBNP  CBG: Recent Labs  Lab  07/18/24 1115 07/18/24 1613 07/18/24 2145 07/19/24 0745 07/19/24 1143  GLUCAP 106* 112* 133* 111* 129*    Microbiology: Results for orders placed or performed during the hospital encounter of 06/29/24  Culture, blood (x 2)     Status: None   Collection Time: 06/29/24  4:39 PM   Specimen: BLOOD  Result Value Ref Range Status   Specimen Description BLOOD LEFT ANTECUBITAL  Final   Special Requests   Final    BOTTLES DRAWN AEROBIC AND ANAEROBIC Blood Culture adequate volume   Culture   Final    NO GROWTH 5 DAYS Performed at [facility], [address] [place], [place] [address]    Report Status 07/04/2024 FINAL  Final  Culture, blood (Routine X 2) w Reflex to ID Panel     Status: None   Collection Time: 06/29/24  7:02 PM   Specimen: BLOOD  Result Value Ref Range Status   Specimen Description BLOOD BLOOD LEFT HAND  Final   Special Requests   Final    BOTTLES DRAWN AEROBIC ONLY Blood Culture results may not be optimal due to an inadequate volume of blood received in culture bottles   Culture   Final    NO GROWTH 5 DAYS Performed at [facility], [address] [place], [place] [address]    Report Status 07/04/2024 FINAL  Final    Coagulation Studies: No results for input(s): LABPROT, INR in the last 72 hours.  Urinalysis: No results for input(s): COLORURINE, LABSPEC, PHURINE, GLUCOSEU, HGBUR, BILIRUBINUR, KETONESUR, PROTEINUR, UROBILINOGEN, NITRITE, LEUKOCYTESUR in the last 72 hours.  Invalid input(s): APPERANCEUR    Imaging: No results found.   Assessment & Plan: Alyssa Leonard is a 68 y.o.  female with past medical history of hypertension, GERD, OSA, obesity, pAfib, renal cell carcinoma, and malignant neoplasm of the pancreas, who was admitted to [facility] on 07/13/2024 for Dehydration [E86.0] Lack of  appetite [R63.0] AKI (acute kidney injury) (HCC) [N17.9]  Acute kidney injury on chronic kidney disease stage IIIb.  Acute kidney injury appears prerenal due to persistent poor oral intake.Baseline creatinine 1.33 with GFR 44.  Agree with IVF. No acute indication for dialysis. Continue to encourage oral supplements. Due to history of renal cell carcinoma and presence of pancreatic mass, palliative care consult would be appropriate to consider goals of care. Will continue to follow this patient during this admission.   2. Acute metabolic acidosis, S bicarb 20 on admission. Likely secondary to kidney injury. Should correct with kidney function. Can consider oral supplementation if worse tomorrow.   3. Anemia of chronic kidney disease Lab Results  Component Value Date   HGB 8.8 (L) 07/19/2024    Hgb below desired range. Due to cancer history, will avoid ESA.   4. Hypertension with chronic kidney disease. Prescribed Metoprolol  only.    LOS: 3 Kyung Muto 9/22/202511:50 AM

## 2024-07-19 NOTE — Progress Notes (Signed)
 Hematology/Oncology Consult note [facility]  [phone] [phone]  Patient Care Team: Entzminger, Ethridge LABOR, MD as PCP - General (Internal Medicine) Darron Deatrice LABOR, MD as PCP - Cardiology (Cardiology) Luke Elsie GRADE, MD as PCP - Hematology/Oncology (Oncology) Kennyth Chew, MD as PCP - Electrophysiology (Cardiology) Melanee Annah BROCKS, MD as Consulting Physician (Oncology) Maurie Rayfield BIRCH, RN as Oncology Nurse Navigator   Name of the patient: Alyssa Leonard  [id number]  October 28, 1956   Date of visit: 07/19/24  Diagnosis- 1.  Stage IIIa metastatic renal cell carcinoma 2.  Pancreatic mass concerning for pancreatic cancer locally advanced  Chief complaint/ Reason for visit-transfer of care from [facility]  Heme/Onc history: Patient is a 68 year old female with a past medical history significant for hypertension hyperlipidemia type 2 diabetes, paroxysmal atrial fibrillation sleep apnea and obesity among other medical problems.  She was seen by me in October 2024 after she was incidentally found to have a large exophytic mass in the right kidney measuring 8.9 cm approaching the duodenum.At that time she was also found to have a pancreatic mass which was approaching the duodenum.  Patient had a biopsy of the pancreatic mass at [facility] which was consistent with IPMN and therefore this was being monitored conservatively.  Subsequently patient was found to have obstructive jaundice from pancreatic mass in August 2025 and underwent PTC drain placement with a plan to eventually put in the stent by interventional radiology  With regards to locally advanced metastatic renal cell carcinoma she was also found to have possible pulmonary involvement due to subcentimeter lung nodules.  T4 N0 possibly M1 and was started on immunotherapy with combination ipilimumab and nivolumab with a last cycle of nivolumab that was given in August 2025.  Subsequently cycles were held due  to concern for obstructive jaundice.  Patient has now been referred back to me for management of her pancreatic mass and renal cell carcinoma.  She was supposed to get a repeat pancreatic biopsy which had to be postponed due to her recent episode of obstructive jaundice.   Interval history-overall patient feels poorly today.  She has no appetite and reports significant fatigue.  She has lost 50 pounds in the last 3 months.  ECOG PS- 3 Pain scale- 4 Opioid associated constipation- no  Review of systems- Review of Systems  Constitutional:  Positive for malaise/fatigue and weight loss. Negative for chills and fever.  HENT:  Negative for congestion, ear discharge and nosebleeds.   Eyes:  Negative for blurred vision.  Respiratory:  Negative for cough, hemoptysis, sputum production, shortness of breath and wheezing.   Cardiovascular:  Negative for chest pain, palpitations, orthopnea and claudication.  Gastrointestinal:  Positive for abdominal pain. Negative for blood in stool, constipation, diarrhea, heartburn, melena, nausea and vomiting.  Genitourinary:  Negative for dysuria, flank pain, frequency, hematuria and urgency.  Musculoskeletal:  Negative for back pain, joint pain and myalgias.  Skin:  Negative for rash.  Neurological:  Negative for dizziness, tingling, focal weakness, seizures, weakness and headaches.  Endo/Heme/Allergies:  Does not bruise/bleed easily.  Psychiatric/Behavioral:  Positive for depression. Negative for suicidal ideas. The patient does not have insomnia.       Allergies  Allergen Reactions   Latex Other (See Comments), Dermatitis, Hives and Swelling    unknown  Other reaction(s): Other (See Comments)  unknown  Other reaction(s): Other (See Comments) unknown    unknown   Aspirin Other (See Comments) and Nausea And Vomiting  Makes stomach burn  Other reaction(s): Other (See Comments)  Makes stomach burn  Makes stomach burn    Other reaction(s): Other  (See Comments) Makes stomach burn   Plasticized Base [Plastibase]      Past Medical History:  Diagnosis Date   Asthma    Atrial flutter (HCC)    COPD (chronic obstructive pulmonary disease) (HCC)    Diabetes mellitus without complication (HCC)    GERD (gastroesophageal reflux disease)    History of colon polyps    Hypertension    Meniere disease    OSA (obstructive sleep apnea)    Renal cell carcinoma (HCC)    Sleep apnea    Went for sleep study test in January 2018 I haven't heard back from test     Past Surgical History:  Procedure Laterality Date   ABDOMINAL HYSTERECTOMY     BARIATRIC SURGERY     CHOLECYSTECTOMY     COLONOSCOPY WITH PROPOFOL  N/A 12/11/2016   Procedure: COLONOSCOPY WITH PROPOFOL ;  Surgeon: Lamar ONEIDA Holmes, MD;  Location: [facility] ENDOSCOPY;  Service: Endoscopy;  Laterality: N/A;   COLONOSCOPY WITH PROPOFOL  N/A 11/27/2021   Procedure: COLONOSCOPY WITH PROPOFOL ;  Surgeon: Maryruth Ole ONEIDA, MD;  Location: [facility] ENDOSCOPY;  Service: Endoscopy;  Laterality: N/A;   IR BILIARY STENT(S) EXIST ACCESS INC DILAT CATH EXCH/REMOVAL  07/16/2024   IR CONVERT BILIARY DRAIN TO INT EXT BILIARY DRAIN  06/29/2024   IR EXCHANGE BILIARY DRAIN  06/22/2024   IR INT EXT BILIARY DRAIN WITH CHOLANGIOGRAM  06/09/2024   IR RADIOLOGIST EVAL & MGMT  06/15/2024    Social History   Socioeconomic History   Marital status: Married    Spouse name: Not on file   Number of children: Not on file   Years of education: Not on file   Highest education level: Not on file  Occupational History   Not on file  Tobacco Use   Smoking status: Former    Current packs/day: 0.00    Types: Cigarettes    Quit date: 11/14/1993    Years since quitting: 30.6   Smokeless tobacco: Never  Vaping Use   Vaping status: Never Used  Substance and Sexual Activity   Alcohol use: No   Drug use: No   Sexual activity: Not Currently  Other Topics Concern   Not on file  Social History Narrative   Not on file    Social Drivers of Health   Financial Resource Strain: Low Risk  (06/01/2024)   Received from [facility]   Overall Financial Resource Strain (CARDIA)    Difficulty of Paying Living Expenses: Not hard at all  Food Insecurity: No Food Insecurity (07/14/2024)   Hunger Vital Sign    Worried About Running Out of Food in the Last Year: Never true    Ran Out of Food in the Last Year: Never true  Transportation Needs: No Transportation Needs (07/14/2024)   PRAPARE - Administrator, Civil Service (Medical): No    Lack of Transportation (Non-Medical): No  Physical Activity: Not on file  Stress: Not on file  Social Connections: Socially Integrated (07/14/2024)   Social Connection and Isolation Panel    Frequency of Communication with Friends and Family: More than three times a week    Frequency of Social Gatherings with Friends and Family: More than three times a week    Attends Religious Services: 1 to 4 times per year    Active Member of Clubs or Organizations: No  Attends Banker Meetings: More than 4 times per year    Marital Status: Married  Catering manager Violence: Not At Risk (07/14/2024)   Humiliation, Afraid, Rape, and Kick questionnaire    Fear of Current or Ex-Partner: No    Emotionally Abused: No    Physically Abused: No    Sexually Abused: No    Family History  Problem Relation Age of Onset   Hypertension Mother    Kidney disease Sister    Kidney disease Brother    Heart disease Brother    Heart disease Brother    Breast cancer Neg Hx     No current facility-administered medications for this visit. No current outpatient medications on file.  Facility-Administered Medications Ordered in Other Visits:    albuterol  (PROVENTIL ) (2.5 MG/3ML) 0.083% nebulizer solution 2.5 mg, 2.5 mg, Nebulization, Q6H PRN, Dail Rankin RAMAN, RPH   amiodarone  (PACERONE ) tablet 200 mg, 200 mg, Oral, Daily, Cox, Amy N, DO, 200 mg at 07/18/24 1020    apixaban  (ELIQUIS ) tablet 5 mg, 5 mg, Oral, BID, Cox, Amy N, DO, 5 mg at 07/18/24 2124   bisacodyl  (DULCOLAX) EC tablet 5 mg, 5 mg, Oral, Daily PRN, Cox, Amy N, DO   diazepam  (VALIUM ) tablet 2 mg, 2 mg, Oral, BID PRN, Cox, Amy N, DO   docusate sodium  (COLACE) capsule 100 mg, 100 mg, Oral, BID, Cox, Amy N, DO, 100 mg at 07/18/24 2123   famotidine  (PEPCID ) tablet 20 mg, 20 mg, Oral, BID BM & HS PRN, Dezii, Alexandra, DO, 20 mg at 07/14/24 1026   feeding supplement (ENSURE PLUS HIGH PROTEIN) liquid 237 mL, 237 mL, Oral, TID BM, Cox, Amy N, DO, 237 mL at 07/15/24 1041   feeding supplement (GLUCERNA SHAKE) (GLUCERNA SHAKE) liquid 237 mL, 237 mL, Oral, TID BM, Dezii, Alexandra, DO, 237 mL at 07/18/24 1544   insulin  aspart (novoLOG ) injection 0-15 Units, 0-15 Units, Subcutaneous, TID WC, Cox, Amy N, DO, 2 Units at 07/18/24 0808   insulin  aspart (novoLOG ) injection 0-5 Units, 0-5 Units, Subcutaneous, QHS, Cox, Amy N, DO   lactated ringers  infusion, , Intravenous, Continuous, Dezii, Alexandra, DO, Last Rate: 100 mL/hr at 07/18/24 2129, New Bag at 07/18/24 2129   lactulose  (CHRONULAC ) 10 GM/15ML solution 10 g, 10 g, Oral, TID, Dezii, Alexandra, DO, 10 g at 07/17/24 1056   lidocaine  (XYLOCAINE ) 1 % (with pres) injection 10 mL, 10 mL, Intradermal, Once, [name] Aran, MD   meclizine  (ANTIVERT ) tablet 25 mg, 25 mg, Oral, TID PRN, Cox, Amy N, DO   metoCLOPramide  (REGLAN ) injection 5 mg, 5 mg, Intravenous, Q8H, Dezii, Alexandra, DO, 5 mg at 07/19/24 0525   metoprolol  tartrate (LOPRESSOR ) tablet 25 mg, 25 mg, Oral, BID, Cox, Amy N, DO, 25 mg at 07/18/24 2124   mirtazapine  (REMERON ) tablet 7.5 mg, 7.5 mg, Oral, QHS, Cox, Amy N, DO, 7.5 mg at 07/18/24 2124   oxyCODONE  (Oxy IR/ROXICODONE ) immediate release tablet 5 mg, 5 mg, Oral, Q8H PRN, Cox, Amy N, DO, 5 mg at 07/19/24 0526   senna-docusate (Senokot-S) tablet 1 tablet, 1 tablet, Oral, QHS PRN, Cox, Amy N, DO   sodium chloride  flush (NS) 0.9 % injection 10-40 mL,  10-40 mL, Intracatheter, Q12H, Dezii, Alexandra, DO, 10 mL at 07/18/24 2125   sodium chloride  flush (NS) 0.9 % injection 10-40 mL, 10-40 mL, Intracatheter, PRN, Dezii, Alexandra, DO  Physical exam:  Vitals:   07/13/24 0919  BP: 107/77  Pulse: (!) 119  Resp: 20  Temp: 97.8 F (36.6  C)  SpO2: 100%  Weight: 225 lb 1.6 oz (102.1 kg)   Physical Exam Constitutional:      Comments: Sitting in a wheelchair.  Appears fatigued  Eyes:     Pupils: Pupils are equal, round, and reactive to light.  Cardiovascular:     Rate and Rhythm: Normal rate and regular rhythm.     Heart sounds: Normal heart sounds.  Pulmonary:     Effort: Pulmonary effort is normal.     Breath sounds: Normal breath sounds.  Abdominal:     General: Bowel sounds are normal.     Palpations: Abdomen is soft.     Comments: PT CA in place  Skin:    General: Skin is warm and dry.  Neurological:     Mental Status: She is alert and oriented to person, place, and time.      I have personally reviewed labs listed below:    Latest Ref Rng & Units 07/19/2024    4:16 AM  CMP  Glucose 70 - 99 mg/dL 894   BUN 8 - 23 mg/dL 43   Creatinine 9.55 - 1.00 mg/dL 7.27   Sodium 864 - 854 mmol/L 138   Potassium 3.5 - 5.1 mmol/L 4.1   Chloride 98 - 111 mmol/L 111   CO2 22 - 32 mmol/L 20   Calcium  8.9 - 10.3 mg/dL 8.3   Total Protein 6.5 - 8.1 g/dL 5.4   Total Bilirubin 0.0 - 1.2 mg/dL 1.0   Alkaline Phos 38 - 126 U/L 100   AST 15 - 41 U/L 17   ALT 0 - 44 U/L 17       Latest Ref Rng & Units 07/19/2024    4:16 AM  CBC  WBC 4.0 - 10.5 K/uL 12.9   Hemoglobin 12.0 - 15.0 g/dL 8.8   Hematocrit 63.9 - 46.0 % 26.3   Platelets 150 - 400 K/uL 153    I have personally reviewed Radiology images listed below: No images are attached to the encounter.  IR BILIARY STENT(S) EXIST ACCESS INC DILAT CATH EXCH/REMOVAL Result Date: 07/16/2024 INDICATION: Common bile duct obstruction secondary to pancreatic mass and status post previous  placement of internal/external biliary drain. Bilirubin has now normalized and the patient presents for possible internalization of biliary stenting via percutaneous approach. EXAM: BILIARY STENT PLACEMENT VIA PRE-EXISTING INTERNAL/EXTERNAL DRAIN APPROACH INCLUDING CHOLANGIOGRAM MEDICATIONS: 2 g IV cefoxitin ; The antibiotic was administered within an appropriate time frame prior to the initiation of the procedure. ANESTHESIA/SEDATION: Moderate (conscious) sedation was employed during this procedure. A total of Versed  3.0 mg and Fentanyl  125 mcg was administered intravenously by the radiology nurse. Total intra-service moderate Sedation Time: 51 minutes. The patient's level of consciousness and vital signs were monitored continuously by radiology nursing throughout the procedure under my direct supervision. FLUOROSCOPY: Radiation Exposure Index (as provided by the fluoroscopic device): 469 mGy Kerma COMPLICATIONS: None immediate. PROCEDURE: Informed written consent was obtained from the patient after a thorough discussion of the procedural risks, benefits and alternatives. All questions were addressed. Maximal Sterile Barrier Technique was utilized including caps, mask, sterile gowns, sterile gloves, sterile drape, hand hygiene and skin antiseptic. A timeout was performed prior to the initiation of the procedure. Initial cholangiogram was performed through the pre-existing internal/external biliary drainage catheter. A radiopaque stent guided ruler had been placed along the patient's back in order to guide measurement assessment for stent placement. The drain was then cut and removed over a guidewire. A 7 French sheath was  advanced over the wire to the level of the duodenum. Additional contrast and air was injected to outline the duodenal lumen. The sheath was retracted across the common bile duct in order to define length of common bile duct stricture/obstruction. A 10 mm x 60 mm Wallflex covered biliary stent  prosthesis was chosen for placement. The covered stent deployment system was advanced over a guidewire to the level of the duodenum and retracted into appropriate position. The stent was then deployed across the common bile duct. After stent deployment, the stent lumen was additionally dilated with an 8 mm x 40 mm Athletis balloon. Balloon dilatation was performed in 2 different segments in order to cover the stented segment. The balloon was deflated and removed. Additional cholangiogram was then performed through the 7 French sheath positioned at the confluence of right and left intrahepatic bile ducts as well as in the right intrahepatic bile ducts. Percutaneous right-sided biliary access was then removed with removal of guidewire and sheath. A gauze dressing was applied over the percutaneous access site. 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 Intrahepatic bile ducts are relatively decompressed. Based on length of estimated stricture, a 10 mm x 60 mm covered self expanding biliary stent was chosen for placement. After stent deployment, due to persistent narrowing of the stent in its midportion, the stent was further post dilated to 8 mm resulting in improved patency. The stent is well positioned extending from just below the biliary confluence of right and left intrahepatic ducts to the level of the duodenum. Excellent antegrade flow is documented through the stent after placement which allowed complete removal of percutaneous access on completion. 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 The covered stent was additionally post dilated with an 8 mm balloon and demonstrates excellent patency extending from just below the biliary confluence to the  duodenum with widely patent flow demonstrated after stent placement. Percutaneous biliary access was removed upon completion of the procedure. Electronically Signed   By: Marcey Moan M.D.   On: 07/16/2024 16:02   DG Abd 2 Views Result Date: 07/01/2024 CLINICAL DATA:  Constipation. EXAM: ABDOMEN - 2 VIEW COMPARISON:  CT 06/08/2024 FINDINGS: No bowel dilatation or evidence of obstruction. Air and small volume of stool throughout nondilated colon. Small volume of stool in the rectum. Multiple surgical clips in the left upper quadrant. A few surgical clips in the pelvis. Right upper quadrant biliary stent. No free intra-abdominal air. IMPRESSION: Small volume formed stool in the colon. No bowel obstruction. Electronically Signed   By: Andrea Gasman M.D.   On: 07/01/2024 00:03   IR CONVERT BILIARY DRAIN TO INT EXT BILIARY DRAIN Result Date: 06/29/2024 INDICATION: Biliary obstruction in a patient with an indwelling biliary drain previously placed 06/09/2024. On 06/22/2024, the patient returned for further evaluation to find that the catheter had partially retracted internally with a redundant loop of the 10 [place] biliary drain extra capsular to the liver. Despite multiple attempts to gain access across the common bile duct obstruction, the biliary drainage catheter would not track across this obstruction and a drain was left in the common bile duct. The patient returns today for further evaluation and potential new access or converting the catheter to a better positioned internal/external biliary drainage catheter EXAM: Drain exchange COMPARISON:  None Available.  ANESTHESIA/SEDATION: Moderate (conscious) sedation was employed during this procedure. A total of Versed  3 mg and Fentanyl  200 mcg was administered intravenously by the radiology nurse. Total intra-service moderate Sedation Time: 30 minutes. The patient's level of consciousness and vital signs were monitored continuously by radiology nursing  throughout the procedure under my direct supervision. CONTRAST:  25 mL Omnipaque  300-administered into the collecting system(s) FLUOROSCOPY: Radiation Exposure Index (as provided by the fluoroscopic device): 406 mGy Kerma COMPLICATIONS: None immediate. PROCEDURE: Informed written consent was obtained from the patient after a thorough discussion of the procedural risks, benefits and alternatives. All questions were addressed. Maximal Sterile Barrier Technique was utilized including caps, mask, sterile gowns, sterile gloves, sterile drape, hand hygiene and skin antiseptic. A timeout was performed prior to the initiation of the procedure. In a supine position, the right upper quadrant and external portion of the catheter were prepped and draped in usual sterile fashion. Dilute contrast was injected into the indwelling biliary drain filling the right lobe bile ducts as well as the common bile duct. Common bile duct is markedly dilated with moderate dilatation of the intrahepatic ducts. Multiple attempts were then made to advance a guidewire through the pigtail catheter and out through the ampulla into the duodenal without success. The pigtail catheter was cut and removed over a Bentson guidewire. An angled glide catheter was then advanced over the Bentson and attempts at reducing the redundant extra capsular loop were unsuccessful. The catheter was then directed toward the ampulla within the common bile duct and using a combination of Glidewire and Bentson guidewire finally gaining access into the duodenum. Once duodenal access was obtained, contrast was injected verifying intraluminal position within the bowel. The Bentson guidewire was then advanced into the jejunum until enough guidewire was demonstrated to be looping redundantly within the jejunum giving a long length of guidewire position within the small bowel. The glide catheter was then withdrawn. Slow gentle traction was then applied to the guidewire with  minimal reduction in the redundant extra capsular loop. Rapid and somewhat forceful traction was then applied to the guidewire and the redundant loop was completely reduced. At this point a 6 French 25 cm sheath was advanced over the guidewire into the common bile duct and directed toward the ampulla. The introducer was then removed and the angled glide catheter was advanced over the guidewire into the jejunum. The guidewire was then removed and exchanged for an Amplatz wire. The sheath and catheter were then completely removed leaving the guidewire in position. An 8 [place] by 40 cm skater biliary drain was advanced over the guidewire and coiled within the duodenal. Contrast was again injected demonstrating final position. Retention suture was applied. Sterile dressing applied. Catheter was connected to gravity drainage. IMPRESSION: Satisfactory reduction of the extra capsular loop and a advancement of the distal tip of the catheter into the bowel completing the internal/external biliary drain placement in a good position for full function. The catheter will remain connected to gravity drainage. The patient is being admitted for tachycardia, worsening renal function and likely underlying sepsis. IR will continue to monitor bilirubin and drain function. Plan for capping once bilirubin has normalized. The patient had previous difficulty with the internal external drain care and may need extra education and support. Short-term follow-up in the clinic after discharge would likely be beneficial. The patient will be scheduled for a 4 week return for drain change and evaluation as well. Electronically Signed   By: Cordella Banner   On: 06/29/2024  14:14   IR EXCHANGE BILIARY DRAIN Result Date: 06/22/2024 INDICATION: Status post percutaneous biliary drainage procedure with placement of 10 French internal/external biliary drainage catheter via right lobe percutaneous biliary access on 06/09/2024. There is significant  leakage of bile around the tube exit site currently. EXAM: EXCHANGE OF BILIARY DRAINAGE CATHETER UNDER FLUOROSCOPY INCLUDING CHOLANGIOGRAM MEDICATIONS: None ANESTHESIA/SEDATION: None FLUOROSCOPY: Radiation Exposure Index (as provided by the fluoroscopic device): 541 mGy Kerma CONTRAST:  25 mL Omnipaque  300 COMPLICATIONS: None immediate. PROCEDURE: Informed written consent was obtained from the patient after a thorough discussion of the procedural risks, benefits and alternatives. All questions were addressed. Maximal Sterile Barrier Technique was utilized including caps, mask, sterile gowns, sterile gloves, sterile drape, hand hygiene and skin antiseptic. A timeout was performed prior to the initiation of the procedure. Initial fluoroscopy was performed of the indwelling biliary drainage catheter. The exiting drain was injected with contrast. The drainage catheter was then removed over a guidewire and a 5 French catheter advanced over the wire. Contrast was injected via the catheter to opacify the drain tract. The catheter was then advanced over a hydrophilic guidewire into right intrahepatic bile ducts followed by the common bile duct. The 5 French catheter was further advanced to the level of the duodenum and a guidewire advanced into the duodenum. A 10 French internal/external biliary drainage catheter was attempted to be advanced over various guidewires. Ultimately, a 10 [place] multipurpose drainage catheter was advanced over a guidewire and partially formed. The catheter was injected with contrast to confirm final positioning and attached to a gravity drainage bag. The catheter was secured at the skin exit site with a Prolene retention suture and StatLock device. FINDINGS: Initial fluoroscopy demonstrates severe retraction of the previously placed internal/external biliary drain nearly out of the liver with some drainage side holes located outside of the costal margin. Injection of contrast barely opacifies the  liver. A 5 French catheter was placed and contrast injection did opacify a tract back into right intrahepatic bile ducts. There is a persistent redundant and tortuous tract peripheral to bile ducts outside of the liver. 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 Despite access through to the level of the duodenum, a 10 [place] internal/external biliary drain could not be fully advanced into the duodenum due to buckling of guidewire and catheter outside of the liver which prevented the drain to cross the distal CBD stricture. Ultimately, a standard 10 [place] multipurpose drain was advanced with sideholes predominantly in right-sided intrahepatic ducts and the distal portion of the catheter extending just to the origin of the common bile duct. This drain is draining bile and will be left to gravity drainage. As this drain will not be adequate for long-term drainage, the patient will be brought back for either re-attempted conversion to an internal/external biliary drain or new biliary drain placement via new percutaneous biliary access under moderate IV conscious sedation. IMPRESSION: 1. Previously placed internal/external biliary drainage catheter had retracted nearly out of the liver and bile ducts. 2. Tract back into right intrahepatic bile ducts and across the CBD was not able to be recanalized by a 5 French catheter to the level of the duodenum. 3. A 10 French internal/external biliary drainage catheter could not be advanced into the duodenum due to buckling of guidewire and catheter outside of the liver, per venting the drain to  cross the CBD. 4. A standard 10 [place] multipurpose drain was advanced into right-sided intrahepatic ducts and just to the level of the origin of the common bile duct. This will be left to external gravity drainage. 5. The patient will be brought  back for re-attempted conversion versus new percutaneous biliary drainage catheter placement under moderate IV conscious sedation. Electronically Signed   By: Marcey Moan M.D.   On: 06/22/2024 15:54     Assessment and plan- Patient is a 68 y.o. female here to reestablish care for following issues:  Locally advanced renal cell carcinoma T4 N0 possibly M1 with subcentimeter lung nodules: Patient was previously on combination ipilimumab and nivolumab and was last given maintenance nivolumab in early August 2025.  Subsequently cycles were held due to obstructive jaundice.  We need to look into her pancreatic mass before we can restart immunotherapy for her renal cell carcinoma  Pancreatic mass:Most recent CT abdomen and pelvis with contrast from 06/08/2024 showed interval increase in the size of pancreatic head mass.  Loss of fat plane between pancreatic mass and gastric antrum and duodenum worrisome for invasion interval increase in size of renal cell mass.  Also her CA 19-9 is significantly elevated at 4303.  This is all highly concerning for pancreatic cancer and patient ideally needs an EUS for biopsy confirmation.  I am concerned that even if we make a diagnosis of pancreatic cancer overall patient's performance status is poor and she is likely not a candidate for any active systemic treatment  I also checked her labs today in the clinic including CBC with differential and CMP.  Her CMP was significantly abnormal with a creatinine of 3.05 as compared to her baseline creatinine of 1.09 in July 2025.  We are therefore sending the patient to emergency room for her AKI  Neoplasm related pain: She is meeting with palliative care today to manage her abdominal pain as well as to establish and start goals of care conversation  While she is in the hospital we will need to come up with a long-term plan with regards to her nutritional status and AKI.She will eventually need outpatient EUS guided pancreatic  biopsy we will also consult IR for biliary stent placement   Visit Diagnosis 1. Pancreatic mass   2. Obstructive jaundice   3. Renal cell carcinoma of right kidney (HCC)   4. AKI (acute kidney injury) (HCC)      Dr. Annah Skene, MD, MPH [facility] [phone] 07/19/2024 8:44 AM

## 2024-07-19 NOTE — Progress Notes (Signed)
 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  Patient reports that she has had poor p.o. intake since cancer diagnosis.  She also endorses recurrent nausea.  She has recently met with oncology and palliative care and is planning for biopsy of the pancreatic mass.  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  Expanding metallic biliary stent was placed with improved biliary patency, external biliary stent was removed.  Subjective: No acute events overnight.  Still having difficulty with appetite.  Reports she is unable to eat much.  Is trialing popsicles this morning.  Objective: Vitals:   07/18/24 2111 07/19/24 0531 07/19/24 0800 07/19/24 1046  BP: 118/60 102/62 115/68 120/73  Pulse: 100 88 68 92  Resp: 18 18 17    Temp: 98.2 F (36.8 C) 98 F (36.7 C) 98 F (36.7 C)   TempSrc: Oral Oral Oral   SpO2: 100% 100% 100% 100%  Weight:      Height:        Intake/Output Summary (Last 24 hours) at 07/19/2024 1136 Last data filed at 07/19/2024 0850 Gross per 24 hour  Intake 2071.74 ml  Output --  Net 2071.74 ml    Filed Weights   07/13/24 1612  Weight: 102 kg    Examination: General exam: Appears calm and comfortable, NAD  Respiratory system: No work of breathing, symmetric chest wall expansion Cardiovascular system: S1 & S2 heard, RRR.  Gastrointestinal system: Abdomen is nondistended, soft, tender to palpation in epigastrium and right upper quadrant Psychiatry: Mood & affect appropriate for situation.    Assessment & Plan:  Principal Problem:   AKI (acute kidney injury) (HCC) Active Problems:   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)   Mass of head of pancreas   COPD (chronic obstructive pulmonary disease) (HCC)   OSA (obstructive sleep apnea)   Paroxysmal atrial flutter (HCC)   Hypertension   Diabetes mellitus without complication (HCC)   GERD (gastroesophageal reflux disease)   Neoplasm related pain   Palliative care encounter   AKI - Suspect this is prerenal secondary to poor p.o. intake, certainly complicated by massive renal mass - Baseline creatinine 1 month ago 0.76 - Have consulted nephrology, appreciate any additional input - Continue to trend CMP - Continue with IV fluids until p.o. intake has improved. - Creatinine clearance 21, renally dose when needed  Nausea Abdominal fullness - Secondary to pancreatic mass and large renal mass - Has been on scheduled Reglan  and is having minimal benefit - Continue with current diet, nausea appears resolving but appetite remains poor - History of Roux-en-Y procedure, large pancreatic mass, renal mass, not a candidate for PEG tube placement.  Malignant pancreatic mass - Presumed primary cause of all of the above - Patient previously seen [facility] oncology.  Prior biopsy revealed a IPMN and patient was being monitored under surveillance.  Appears she has not developed pancreatic cancer, but was still pending biopsy via EUS at time of admission. - CT scan from 8/12 reviewed: Right renal mass 11 x 9 x 9 cm, abutting the adjacent duodenum.  Mass  of pancreatic head 3 x 5 cm. - Patient has recently established with oncology.  She was planning for outpatient biopsy but unfortunately with her decline in appetite, worsening kidney function, and poor functional status she would not be a candidate for any cancer therapies at this time.  She is not a surgical candidate. - Have discussed again with oncology who will see the patient  today. - Palliative care has been consulted and prognosis is very poor.   - CA 19-9 has resulted and is very elevated. - Palliative care consulted, at this time prognosis appears unfortunately poor - 9/19: Cholangiogram through biliary drain,, bile duct stent placement with covered metallic stent.  Stage III metastatic renal cell carcinoma - Very large renal mass causing compression and upward displacement. -Previously on immunotherapy - Oncology follow-up as above  Paroxysmal A-fib - Continue Eliquis   OSA - Continue CPAP at night  COPD, no acute exacerbation - As needed home meds  DVT prophylaxis: Eliquis    Code Status: Limited: Do not attempt resuscitation (DNR) -DNR-LIMITED -Do Not Intubate/DNI  Disposition:  Inpatient until clinical improvement  Consultants:    Procedures:    Antimicrobials:  Anti-infectives (From admission, onward)    Start     Dose/Rate Route Frequency Ordered Stop   07/16/24 1515  cefOXitin  (MEFOXIN ) 2 g in sodium chloride  0.9 % 100 mL IVPB        2 g 200 mL/hr over 30 Minutes Intravenous  Once 07/16/24 1422 07/16/24 1513       Data Reviewed: I have personally reviewed following labs and imaging studies CBC: Recent Labs  Lab 07/15/24 0848 07/16/24 0543 07/17/24 0319 07/18/24 0442 07/19/24 0416  WBC 5.9 6.6 6.9 16.4* 12.9*  NEUTROABS 3.3 3.9 4.5 13.3* 9.9*  HGB 10.4* 9.6* 10.0* 10.0* 8.8*  HCT 31.8* 30.3* 30.7* 30.0* 26.3*  MCV 84.4 86.6 85.0 84.0 84.6  PLT 190 186 181 171 153   Basic Metabolic Panel: Recent Labs  Lab 07/15/24 0848 07/16/24 0543 07/17/24 0319 07/18/24 0442 07/19/24 0416  NA 139 140 143 144 138  K 4.5 4.1 4.1 4.3 4.1  CL 108 112* 112* 115* 111  CO2 24 19* 22 21* 20*  GLUCOSE 109* 117* 109* 116* 105*  BUN 36* 36* 33* 35* 43*  CREATININE 2.60* 2.31* 2.44* 2.60* 2.72*  CALCIUM  9.1 8.7* 8.7* 8.7* 8.3*  MG  --  2.0 2.0 1.8 1.8  PHOS  --  2.8 3.3 2.9 2.7   GFR: Estimated Creatinine Clearance: 21.3 mL/min (A)  (by C-G formula based on SCr of 2.72 mg/dL (H)). Liver Function Tests: Recent Labs  Lab 07/15/24 0848 07/16/24 0543 07/17/24 0319 07/18/24 0442 07/19/24 0416  AST 53* 39 28 25 17   ALT 38 31 26 21 17   ALKPHOS 127* 127* 126 121 100  BILITOT 1.4* 1.0 0.9 0.9 1.0  PROT 6.5 6.0* 6.0* 5.5* 5.4*  ALBUMIN 2.7* 2.6* 2.5* 2.2* 2.1*   CBG: Recent Labs  Lab 07/18/24 0759 07/18/24 1115 07/18/24 1613 07/18/24 2145 07/19/24 0745  GLUCAP 129* 106* 112* 133* 111*    No results found for this or any previous visit (from the past 240 hours).   Radiology Studies: No results found.   Scheduled Meds:  amiodarone   200 mg Oral Daily   apixaban   5 mg Oral BID   docusate sodium   100 mg Oral BID   feeding supplement  237 mL Oral TID BM   feeding supplement (GLUCERNA SHAKE)  237 mL Oral TID BM  insulin  aspart  0-15 Units Subcutaneous TID WC   insulin  aspart  0-5 Units Subcutaneous QHS   lactulose   10 g Oral TID   lidocaine   10 mL Intradermal Once   metoCLOPramide  (REGLAN ) injection  5 mg Intravenous Q8H   metoprolol  tartrate  25 mg Oral BID   OLANZapine   10 mg Oral QHS   pantoprazole   40 mg Oral Daily   sodium chloride  flush  10-40 mL Intracatheter Q12H   Continuous Infusions:      LOS: 3 days  MDM: Patient is high risk for one or more organ failure.  They necessitate ongoing hospitalization for continued IV therapies and subsequent lab monitoring. Total time spent interpreting labs and vitals, reviewing the medical record, coordinating care amongst consultants and care team members, directly assessing and discussing care with the patient and/or family: 55 min  Elton Heid, DO [facility]  To contact the attending physician between 7A-7P please use Epic Chat. To contact the covering physician during after hours 7P-7A, please review Amion.  07/19/2024, 11:36 AM   *This document has been created with the assistance of dictation software. Please excuse typographical errors.  *

## 2024-07-20 ENCOUNTER — Other Ambulatory Visit: Payer: Self-pay

## 2024-07-20 DIAGNOSIS — R627 Adult failure to thrive: Secondary | ICD-10-CM

## 2024-07-20 DIAGNOSIS — C649 Malignant neoplasm of unspecified kidney, except renal pelvis: Secondary | ICD-10-CM

## 2024-07-20 DIAGNOSIS — Z515 Encounter for palliative care: Secondary | ICD-10-CM | POA: Diagnosis not present

## 2024-07-20 DIAGNOSIS — E860 Dehydration: Secondary | ICD-10-CM

## 2024-07-20 DIAGNOSIS — N179 Acute kidney failure, unspecified: Secondary | ICD-10-CM | POA: Diagnosis not present

## 2024-07-20 LAB — COMPREHENSIVE METABOLIC PANEL WITH GFR
ALT: 15 U/L (ref 0–44)
AST: 17 U/L (ref 15–41)
Albumin: 2.1 g/dL — ABNORMAL LOW (ref 3.5–5.0)
Alkaline Phosphatase: 114 U/L (ref 38–126)
Anion gap: 7 (ref 5–15)
BUN: 44 mg/dL — ABNORMAL HIGH (ref 8–23)
CO2: 21 mmol/L — ABNORMAL LOW (ref 22–32)
Calcium: 8.4 mg/dL — ABNORMAL LOW (ref 8.9–10.3)
Chloride: 112 mmol/L — ABNORMAL HIGH (ref 98–111)
Creatinine, Ser: 2.61 mg/dL — ABNORMAL HIGH (ref 0.44–1.00)
GFR, Estimated: 19 mL/min — ABNORMAL LOW (ref >60)
Glucose, Bld: 101 mg/dL — ABNORMAL HIGH (ref 70–99)
Potassium: 4 mmol/L (ref 3.5–5.1)
Sodium: 140 mmol/L (ref 135–145)
Total Bilirubin: 1.1 mg/dL (ref 0.0–1.2)
Total Protein: 5.5 g/dL — ABNORMAL LOW (ref 6.5–8.1)

## 2024-07-20 LAB — CBC WITH DIFFERENTIAL/PLATELET
Abs Immature Granulocytes: 0.08 K/uL — ABNORMAL HIGH (ref 0.00–0.07)
Basophils Absolute: 0 K/uL (ref 0.0–0.1)
Basophils Relative: 0 %
Eosinophils Absolute: 0.2 K/uL (ref 0.0–0.5)
Eosinophils Relative: 2 %
HCT: 26.7 % — ABNORMAL LOW (ref 36.0–46.0)
Hemoglobin: 8.7 g/dL — ABNORMAL LOW (ref 12.0–15.0)
Immature Granulocytes: 1 %
Lymphocytes Relative: 13 %
Lymphs Abs: 1.3 K/uL (ref 0.7–4.0)
MCH: 27.8 pg (ref 26.0–34.0)
MCHC: 32.6 g/dL (ref 30.0–36.0)
MCV: 85.3 fL (ref 80.0–100.0)
Monocytes Absolute: 1.2 K/uL — ABNORMAL HIGH (ref 0.1–1.0)
Monocytes Relative: 12 %
Neutro Abs: 7.3 K/uL (ref 1.7–7.7)
Neutrophils Relative %: 72 %
Platelets: 160 K/uL (ref 150–400)
RBC: 3.13 MIL/uL — ABNORMAL LOW (ref 3.87–5.11)
RDW: 16.9 % — ABNORMAL HIGH (ref 11.5–15.5)
WBC: 10 K/uL (ref 4.0–10.5)
nRBC: 0 % (ref 0.0–0.2)

## 2024-07-20 LAB — GLUCOSE, CAPILLARY
Glucose-Capillary: 96 mg/dL (ref 70–99)
Glucose-Capillary: 109 mg/dL — ABNORMAL HIGH (ref 70–99)
Glucose-Capillary: 116 mg/dL — ABNORMAL HIGH (ref 70–99)
Glucose-Capillary: 127 mg/dL — ABNORMAL HIGH (ref 70–99)

## 2024-07-20 LAB — PHOSPHORUS: Phosphorus: 2.7 mg/dL (ref 2.5–4.6)

## 2024-07-20 LAB — MAGNESIUM: Magnesium: 1.8 mg/dL (ref 1.7–2.4)

## 2024-07-20 MED ORDER — ENOXAPARIN SODIUM 100 MG/ML IJ SOSY
100.0000 mg | PREFILLED_SYRINGE | INTRAMUSCULAR | Status: DC
  Administered 2024-07-20: 100 mg via SUBCUTANEOUS
  Filled 2024-07-20 (×2): qty 1

## 2024-07-20 MED ORDER — LACTATED RINGERS IV SOLN
INTRAVENOUS | Status: AC
Start: 2024-07-20 — End: 2024-07-21

## 2024-07-20 NOTE — Progress Notes (Signed)
 PROGRESS NOTE    Alyssa Leonard  FMW:[id number] DOB: [birth year] DOA: 07/13/2024 PCP: [name] LABOR, MD  Chief Complaint  Patient presents with   abnormal labs    Hospital Course:  Alyssa Leonard 68 year old female with history of renal cell carcinoma, obstructive jaundice secondary to malignant neoplasm of the pancreas, paroxysmal A-fib on Eliquis , hypertension, OSA, GERD, morbid obesity, who presents to the ED with AKI.  Patient reports that she has had poor p.o. intake since cancer diagnosis.  She also endorses recurrent nausea.  She has recently met with oncology and palliative care and is planning for biopsy of the pancreatic mass.  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  Expanding metallic biliary stent was placed with improved biliary patency, external biliary stent was removed.  Cirrhosis and further complicated by ongoing AKI, and goals of care discussions.   Ultimately patient has opted to proceed with surgical J-tube placement.  Subjective: No acute events overnight.  Patient reports that she has been considering her options and would like to proceed with J-tube placement.  She understands that this means that she will be in the hospital longer.  Objective: Vitals:   07/19/24 1759 07/19/24 2019 07/20/24 0455 07/20/24 0743  BP: 115/63 119/74 108/67 116/71  Pulse: 82  82 85  Resp: 16 18 17 15   Temp: 98.4 F (36.9 C) 98.7 F (37.1 C) 98.7 F (37.1 C) 98.4 F (36.9 C)  TempSrc:  Oral Oral Oral  SpO2: 100%  100% 100%  Weight:      Height:        Intake/Output Summary (Last 24 hours) at 07/20/2024 1441 Last data filed at 07/20/2024 0900 Gross per 24 hour  Intake 1323.98 ml  Output --  Net 1323.98 ml    Filed Weights   07/13/24 1612  Weight: 102 kg    Examination: General exam: Appears calm and comfortable, NAD  Respiratory system: No work of breathing, symmetric  chest wall expansion Cardiovascular system: S1 & S2 heard, RRR.  Gastrointestinal system: Abdomen is nondistended, soft, tender to palpation in epigastrium and right upper quadrant Psychiatry: Mood & affect appropriate for situation.   Assessment & Plan:  Principal Problem:   AKI (acute kidney injury) Active Problems:   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)   Mass of head of pancreas   COPD (chronic obstructive pulmonary disease) (HCC)   OSA (obstructive sleep apnea)   Paroxysmal atrial flutter (HCC)   Hypertension   Diabetes mellitus without complication (HCC)   GERD (gastroesophageal reflux disease)   Neoplasm related pain   Palliative care encounter   AKI on CKD 3B - Multifactorial.  Prerenal secondary to poor p.o. intake, complicated by massive renal mass - Baseline creatinine 0.76  75month ago. - Nephrology was consulted but agrees with plan as above - Continue IV fluids - J-tube placement hopefully to help with consistent source of hydration - Not a candidate for renal replacement therapy  Nausea Abdominal fullness - Secondary to pancreatic mass and large renal mass - Has been on scheduled Reglan  and is having minimal benefit - Continue with p.o. as tolerated - Planning for J-tube placement with general surgery on 9/25.  This will likely be an open procedure. - History of Roux-en-Y procedure, large pancreatic mass, renal mass, not a candidate for PEG tube placement.  Malignant pancreatic mass - Presumed primary cause of all of the above - Patient previously seen [facility]  oncology.  Prior biopsy revealed IPMN and patient was being monitored .  Appears she has now developed pancreatic cancer, but was still pending biopsy via EUS at time of admission. - CT scan from 8/12 reviewed: Right renal mass 11 x 9 x 9 cm, abutting the adjacent duodenum.  Mass of pancreatic head 3 x 5 cm. - Patient has recently established with oncology.  She was planning for outpatient  biopsy but unfortunately with her decline in appetite, worsening kidney function, and poor functional status she would not be a candidate for any cancer therapies at this time.  She is not a surgical candidate.  Please see oncology notes from 9/22 for more detail - Patient was initially considering discharge home with hospice but has now opted for J-tube placement with hopes that this will increase her functional status. - CA 19-9 has resulted and is very elevated. - Palliative care consulted - 9/19: Cholangiogram through biliary drain,, bile duct stent placement with covered metallic stent.  Stage III metastatic renal cell carcinoma - Very large renal mass causing compression and upward displacement. - Previously on immunotherapy - Oncology follow-up as above  Paroxysmal A-fib - Continue Eliquis   OSA - Continue CPAP at night  COPD, no acute exacerbation - As needed home meds  DVT prophylaxis: Eliquis    Code Status: Limited: Do not attempt resuscitation (DNR) -DNR-LIMITED -Do Not Intubate/DNI  Disposition:  Inpatient until clinical improvement  Consultants:  Treatment Team:  Consulting Physician: Melanee Annah BROCKS, MD Consulting Physician: Jordis Laneta FALCON, MD  Procedures:    Antimicrobials:  Anti-infectives (From admission, onward)    Start     Dose/Rate Route Frequency Ordered Stop   07/16/24 1515  cefOXitin  (MEFOXIN ) 2 g in sodium chloride  0.9 % 100 mL IVPB        2 g 200 mL/hr over 30 Minutes Intravenous  Once 07/16/24 1422 07/16/24 1513       Data Reviewed: I have personally reviewed following labs and imaging studies CBC: Recent Labs  Lab 07/16/24 0543 07/17/24 0319 07/18/24 0442 07/19/24 0416 07/20/24 0238  WBC 6.6 6.9 16.4* 12.9* 10.0  NEUTROABS 3.9 4.5 13.3* 9.9* 7.3  HGB 9.6* 10.0* 10.0* 8.8* 8.7*  HCT 30.3* 30.7* 30.0* 26.3* 26.7*  MCV 86.6 85.0 84.0 84.6 85.3  PLT 186 181 171 153 160   Basic Metabolic Panel: Recent Labs  Lab 07/16/24 0543  07/17/24 0319 07/18/24 0442 07/19/24 0416 07/20/24 0238  NA 140 143 144 138 140  K 4.1 4.1 4.3 4.1 4.0  CL 112* 112* 115* 111 112*  CO2 19* 22 21* 20* 21*  GLUCOSE 117* 109* 116* 105* 101*  BUN 36* 33* 35* 43* 44*  CREATININE 2.31* 2.44* 2.60* 2.72* 2.61*  CALCIUM  8.7* 8.7* 8.7* 8.3* 8.4*  MG 2.0 2.0 1.8 1.8 1.8  PHOS 2.8 3.3 2.9 2.7 2.7   GFR: Estimated Creatinine Clearance: 22.2 mL/min (A) (by C-G formula based on SCr of 2.61 mg/dL (H)). Liver Function Tests: Recent Labs  Lab 07/16/24 0543 07/17/24 0319 07/18/24 0442 07/19/24 0416 07/20/24 0238  AST 39 28 25 17 17   ALT 31 26 21 17 15   ALKPHOS 127* 126 121 100 114  BILITOT 1.0 0.9 0.9 1.0 1.1  PROT 6.0* 6.0* 5.5* 5.4* 5.5*  ALBUMIN 2.6* 2.5* 2.2* 2.1* 2.1*   CBG: Recent Labs  Lab 07/19/24 1143 07/19/24 1639 07/19/24 2139 07/20/24 0747 07/20/24 1203  GLUCAP 129* 99 84 96 109*    No results found for this or any  previous visit (from the past 240 hours).   Radiology Studies: No results found.   Scheduled Meds:  amiodarone   200 mg Oral Daily   docusate sodium   100 mg Oral BID   enoxaparin  (LOVENOX ) injection  100 mg Subcutaneous Q24H   feeding supplement  237 mL Oral TID BM   feeding supplement (GLUCERNA SHAKE)  237 mL Oral TID BM   insulin  aspart  0-15 Units Subcutaneous TID WC   insulin  aspart  0-5 Units Subcutaneous QHS   lactulose   10 g Oral TID   lidocaine   10 mL Intradermal Once   metoCLOPramide  (REGLAN ) injection  5 mg Intravenous Q8H   metoprolol  tartrate  25 mg Oral BID   OLANZapine   10 mg Oral QHS   pantoprazole   40 mg Oral Daily   sodium chloride  flush  10-40 mL Intracatheter Q12H   Continuous Infusions:      LOS: 4 days  MDM: Patient is high risk for one or more organ failure.  They necessitate ongoing hospitalization for continued IV therapies and subsequent lab monitoring. Total time spent interpreting labs and vitals, reviewing the medical record, coordinating care amongst  consultants and care team members, directly assessing and discussing care with the patient and/or family: 55 min  Odena Mcquaid, DO [facility]  To contact the attending physician between 7A-7P please use Epic Chat. To contact the covering physician during after hours 7P-7A, please review Amion.  07/20/2024, 2:41 PM   *This document has been created with the assistance of dictation software. Please excuse typographical errors. *

## 2024-07-20 NOTE — Plan of Care (Signed)
  Problem: Education: Goal: Ability to describe self-care measures that may prevent or decrease complications (Diabetes Survival Skills Education) will improve Outcome: Progressing   Problem: Fluid Volume: Goal: Ability to maintain a balanced intake and output will improve Outcome: Progressing   Problem: Metabolic: Goal: Ability to maintain appropriate glucose levels will improve Outcome: Progressing   Problem: Nutritional: Goal: Maintenance of adequate nutrition will improve Outcome: Progressing   Problem: Skin Integrity: Goal: Risk for impaired skin integrity will decrease Outcome: Progressing   Problem: Tissue Perfusion: Goal: Adequacy of tissue perfusion will improve Outcome: Progressing

## 2024-07-20 NOTE — Consult Note (Addendum)
 Pharmacy Consult Note - Anticoagulation  Pharmacy Consult for enoxaparin  Indication: atrial fibrillation  PATIENT MEASUREMENTS: Height: 5' (152.4 cm) Weight: 102 kg (224 lb 13.9 oz) IBW/kg (Calculated) : 45.5    VITAL SIGNS: Temp: 97.6 F (36.4 C) (09/23 1446) Temp Source: Oral (09/23 1446) BP: 111/67 (09/23 1446) Pulse Rate: 83 (09/23 1446)  Recent Labs    07/20/24 0238  HGB 8.7*  HCT 26.7*  PLT 160  CREATININE 2.61*    Estimated Creatinine Clearance: 22.2 mL/min (A) (by C-G formula based on SCr of 2.61 mg/dL (H)).  PAST MEDICAL HISTORY: Past Medical History:  Diagnosis Date   Asthma    Atrial flutter (HCC)    COPD (chronic obstructive pulmonary disease) (HCC)    Diabetes mellitus without complication (HCC)    GERD (gastroesophageal reflux disease)    History of colon polyps    Hypertension    Meniere disease    OSA (obstructive sleep apnea)    Renal cell carcinoma (HCC)    Sleep apnea    Went for sleep study test in January 2018 I haven't heard back from test    ASSESSMENT: 68 y.o. female with PMH including renal cell carcinoma, pancreatic malignant neoplasm, Afib on Eliquis , HTN, morbid obesity is presenting with AKI and for J-tube placement. Procedure planned for 9/24. Prior to admission patient was not taking prescribed DOAC due to cost, however she has been placed on Eliquis  this admission , most recent dose this morning on 9/23. Pharmacy has been consulted to initiate and manage enoxaparin .   Pertinent medications: Eliquis  5mg , last dose 9/23 morning  Goal(s) of therapy: Monitor platelets by anticoagulation protocol: Yes   Baseline anticoagulation labs: Recent Labs    07/18/24 0442 07/19/24 0416 07/20/24 0238  HGB 10.0* 8.8* 8.7*  PLT 171 153 160    PLAN: Hold apixaban  Initiate enoxaparin  100mg  (1 mg/kg) subQ q24H (CrCl <30) Continue to monitor renal function for necessary dose adjustments Monitor CBC daily  Will M. Lenon, PharmD,  BCPS Clinical Pharmacist 07/20/2024 2:52 PM

## 2024-07-20 NOTE — Care Management Important Message (Signed)
 Important Message  Patient Details  Name: Alyssa Leonard MRN: [id number] Date of Birth: 03/03/1956   Important Message Given:  Yes - Medicare IM     Mikelle Myrick W, CMA 07/20/2024, 9:45 AM

## 2024-07-20 NOTE — Progress Notes (Signed)
 Palliative Medicine [facility] at [facility] [phone] [phone]   Name: LAINE FONNER Date: 07/20/2024 MRN: [id number]  DOB: 17-Nov-1955  Patient Care Team: [name] LABOR, MD as PCP - General (Internal Medicine) Darron Deatrice LABOR, MD as PCP - Cardiology (Cardiology) Luke Elsie GRADE, MD as PCP - Hematology/Oncology (Oncology) Kennyth Chew, MD as PCP - Electrophysiology (Cardiology) Melanee Annah BROCKS, MD as Consulting Physician (Oncology) Maurie Rayfield BIRCH, RN as Oncology Nurse Navigator    REASON FOR CONSULTATION: DAGNY FIORENTINO is a 68 y.o. female with multiple medical problems including morbid obesity, OSA on CPAP, a flutter who could not afford Xarelto  and amiodarone , diabetes, and RCC with intra-abdominal metastasis previously on treatment with immunotherapy.  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  She was receiving treatment at [facility] but transferred care.  Patient was admitted to the hospital with AKI and presumed dehydration.  Palliative care consulted to address goals of manage ongoing symptoms.    CODE STATUS: DNR  PAST MEDICAL HISTORY: Past Medical History:  Diagnosis Date   Asthma    Atrial flutter (HCC)    COPD (chronic obstructive pulmonary disease) (HCC)    Diabetes mellitus without complication (HCC)    GERD (gastroesophageal reflux disease)    History of colon polyps    Hypertension    Meniere disease    OSA (obstructive sleep apnea)    Renal cell carcinoma (HCC)    Sleep apnea    Went for sleep study test in January 2018 I haven't heard back from test    PAST SURGICAL HISTORY:  Past Surgical History:  Procedure Laterality Date   ABDOMINAL HYSTERECTOMY     BARIATRIC SURGERY     CHOLECYSTECTOMY     COLONOSCOPY WITH PROPOFOL  N/A 12/11/2016   Procedure: COLONOSCOPY WITH PROPOFOL ;  Surgeon: Lamar ONEIDA Holmes, MD;  Location: [facility] ENDOSCOPY;  Service: Endoscopy;   Laterality: N/A;   COLONOSCOPY WITH PROPOFOL  N/A 11/27/2021   Procedure: COLONOSCOPY WITH PROPOFOL ;  Surgeon: Maryruth Ole ONEIDA, MD;  Location: [facility] ENDOSCOPY;  Service: Endoscopy;  Laterality: N/A;   IR BILIARY STENT(S) EXIST ACCESS INC DILAT CATH EXCH/REMOVAL  07/16/2024   IR CONVERT BILIARY DRAIN TO INT EXT BILIARY DRAIN  06/29/2024   IR EXCHANGE BILIARY DRAIN  06/22/2024   IR INT EXT BILIARY DRAIN WITH CHOLANGIOGRAM  06/09/2024   IR RADIOLOGIST EVAL & MGMT  06/15/2024    HEMATOLOGY/ONCOLOGY HISTORY:  Oncology History   No history exists.    ALLERGIES:  is allergic to latex, aspirin, and plasticized base [plastibase].  MEDICATIONS:  Current Facility-Administered Medications  Medication Dose Route Frequency Provider Last Rate Last Admin   albuterol  (PROVENTIL ) (2.5 MG/3ML) 0.083% nebulizer solution 2.5 mg  2.5 mg Nebulization Q6H PRN Dail Rankin RAMAN, RPH       amiodarone  (PACERONE ) tablet 200 mg  200 mg Oral Daily Cox, Amy N, DO   200 mg at 07/20/24 0900   bisacodyl  (DULCOLAX) EC tablet 5 mg  5 mg Oral Daily PRN Cox, Amy N, DO       diazepam  (VALIUM ) tablet 2 mg  2 mg Oral BID PRN Cox, Amy N, DO       docusate sodium  (COLACE) capsule 100 mg  100 mg Oral BID Cox, Amy N, DO   100 mg at 07/20/24 0900   famotidine  (PEPCID ) tablet 20 mg  20 mg Oral BID BM & HS PRN Dezii, Alexandra, DO   20 mg  at 07/19/24 1402   feeding supplement (ENSURE PLUS HIGH PROTEIN) liquid 237 mL  237 mL Oral TID BM Cox, Amy N, DO   237 mL at 07/15/24 1041   feeding supplement (GLUCERNA SHAKE) (GLUCERNA SHAKE) liquid 237 mL  237 mL Oral TID BM Dezii, Alexandra, DO   237 mL at 07/20/24 0901   insulin  aspart (novoLOG ) injection 0-15 Units  0-15 Units Subcutaneous TID WC Cox, Amy N, DO   2 Units at 07/19/24 1237   insulin  aspart (novoLOG ) injection 0-5 Units  0-5 Units Subcutaneous QHS Cox, Amy N, DO       lactulose  (CHRONULAC ) 10 GM/15ML solution 10 g  10 g Oral TID Dezii, Alexandra, DO   10 g at 07/17/24 1056    lidocaine  (XYLOCAINE ) 1 % (with pres) injection 10 mL  10 mL Intradermal Once [name] Aran, MD       meclizine  (ANTIVERT ) tablet 25 mg  25 mg Oral TID PRN Cox, Amy N, DO       metoCLOPramide  (REGLAN ) injection 5 mg  5 mg Intravenous Q8H Dezii, Alexandra, DO   5 mg at 07/20/24 9471   metoprolol  tartrate (LOPRESSOR ) tablet 25 mg  25 mg Oral BID Cox, Amy N, DO   25 mg at 07/20/24 0901   OLANZapine  (ZYPREXA ) tablet 10 mg  10 mg Oral QHS Claudean Leavelle R, NP   10 mg at 07/19/24 2206   oxyCODONE  (Oxy IR/ROXICODONE ) immediate release tablet 5 mg  5 mg Oral Q8H PRN Cox, Amy N, DO   5 mg at 07/20/24 0901   pantoprazole  (PROTONIX ) EC tablet 40 mg  40 mg Oral Daily Pamela Intrieri R, NP   40 mg at 07/20/24 0900   senna-docusate (Senokot-S) tablet 1 tablet  1 tablet Oral QHS PRN Cox, Amy N, DO       sodium chloride  flush (NS) 0.9 % injection 10-40 mL  10-40 mL Intracatheter Q12H Dezii, Alexandra, DO   10 mL at 07/20/24 1145   sodium chloride  flush (NS) 0.9 % injection 10-40 mL  10-40 mL Intracatheter PRN Dezii, Alexandra, DO        VITAL SIGNS: BP 116/71 (BP Location: Left Arm)   Pulse 85   Temp 98.4 F (36.9 C) (Oral)   Resp 15   Ht 5' (1.524 m)   Wt 224 lb 13.9 oz (102 kg)   SpO2 100%   BMI 43.92 kg/m  Filed Weights   07/13/24 1612  Weight: 224 lb 13.9 oz (102 kg)    Estimated body mass index is 43.92 kg/m as calculated from the following:   Height as of this encounter: 5' (1.524 m).   Weight as of this encounter: 224 lb 13.9 oz (102 kg).  LABS: CBC:    Component Value Date/Time   WBC 10.0 07/20/2024 0238   HGB 8.7 (L) 07/20/2024 0238   HGB 12.2 08/15/2023 1147   HCT 26.7 (L) 07/20/2024 0238   PLT 160 07/20/2024 0238   PLT 181 08/15/2023 1147   MCV 85.3 07/20/2024 0238   NEUTROABS 7.3 07/20/2024 0238   LYMPHSABS 1.3 07/20/2024 0238   MONOABS 1.2 (H) 07/20/2024 0238   EOSABS 0.2 07/20/2024 0238   BASOSABS 0.0 07/20/2024 0238   Comprehensive Metabolic Panel:    Component  Value Date/Time   NA 140 07/20/2024 0238   K 4.0 07/20/2024 0238   CL 112 (H) 07/20/2024 0238   CO2 21 (L) 07/20/2024 0238   BUN 44 (H) 07/20/2024 0238   CREATININE 2.61 (  H) 07/20/2024 0238   CREATININE 1.01 (H) 08/15/2023 1147   GLUCOSE 101 (H) 07/20/2024 0238   CALCIUM  8.4 (L) 07/20/2024 0238   AST 17 07/20/2024 0238   AST 16 08/15/2023 1147   ALT 15 07/20/2024 0238   ALT 12 08/15/2023 1147   ALKPHOS 114 07/20/2024 0238   BILITOT 1.1 07/20/2024 0238   BILITOT 0.6 08/15/2023 1147   PROT 5.5 (L) 07/20/2024 0238   ALBUMIN 2.1 (L) 07/20/2024 0238    RADIOGRAPHIC STUDIES: IR BILIARY STENT(S) EXIST ACCESS INC DILAT CATH EXCH/REMOVAL Result Date: 07/16/2024 INDICATION: Common bile duct obstruction secondary to pancreatic mass and status post previous placement of internal/external biliary drain. Bilirubin has now normalized and the patient presents for possible internalization of biliary stenting via percutaneous approach. EXAM: BILIARY STENT PLACEMENT VIA PRE-EXISTING INTERNAL/EXTERNAL DRAIN APPROACH INCLUDING CHOLANGIOGRAM MEDICATIONS: 2 g IV cefoxitin ; The antibiotic was administered within an appropriate time frame prior to the initiation of the procedure. ANESTHESIA/SEDATION: Moderate (conscious) sedation was employed during this procedure. A total of Versed  3.0 mg and Fentanyl  125 mcg was administered intravenously by the radiology nurse. Total intra-service moderate Sedation Time: 51 minutes. The patient's level of consciousness and vital signs were monitored continuously by radiology nursing throughout the procedure under my direct supervision. FLUOROSCOPY: Radiation Exposure Index (as provided by the fluoroscopic device): 469 mGy Kerma COMPLICATIONS: None immediate. PROCEDURE: Informed written consent was obtained from the patient after a thorough discussion of the procedural risks, benefits and alternatives. All questions were addressed. Maximal Sterile Barrier Technique was utilized  including caps, mask, sterile gowns, sterile gloves, sterile drape, hand hygiene and skin antiseptic. A timeout was performed prior to the initiation of the procedure. Initial cholangiogram was performed through the pre-existing internal/external biliary drainage catheter. A radiopaque stent guided ruler had been placed along the patient's back in order to guide measurement assessment for stent placement. The drain was then cut and removed over a guidewire. A 7 French sheath was advanced over the wire to the level of the duodenum. Additional contrast and air was injected to outline the duodenal lumen. The sheath was retracted across the common bile duct in order to define length of common bile duct stricture/obstruction. A 10 mm x 60 mm Wallflex covered biliary stent prosthesis was chosen for placement. The covered stent deployment system was advanced over a guidewire to the level of the duodenum and retracted into appropriate position. The stent was then deployed across the common bile duct. After stent deployment, the stent lumen was additionally dilated with an 8 mm x 40 mm Athletis balloon. Balloon dilatation was performed in 2 different segments in order to cover the stented segment. The balloon was deflated and removed. Additional cholangiogram was then performed through the 7 French sheath positioned at the confluence of right and left intrahepatic bile ducts as well as in the right intrahepatic bile ducts. Percutaneous right-sided biliary access was then removed with removal of guidewire and sheath. A gauze dressing was applied over the percutaneous access site. 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 Intrahepatic bile ducts are relatively decompressed. Based on length of estimated stricture, a 10 mm x 60 mm covered self expanding biliary stent was chosen for placement. After stent deployment,  due to persistent narrowing of the stent in its midportion, the stent was further post dilated to 8 mm resulting in improved patency. The stent is well positioned extending from just below  the biliary confluence of right and left intrahepatic ducts to the level of the duodenum. Excellent antegrade flow is documented through the stent after placement which allowed complete removal of percutaneous access on completion. 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 The covered stent was additionally post dilated with an 8 mm balloon and demonstrates excellent patency extending from just below the biliary confluence to the duodenum with widely patent flow demonstrated after stent placement. Percutaneous biliary access was removed upon completion of the procedure. Electronically Signed   By: Marcey Moan M.D.   On: 07/16/2024 16:02   DG Abd 2 Views Result Date: 07/01/2024 CLINICAL DATA:  Constipation. EXAM: ABDOMEN - 2 VIEW COMPARISON:  CT 06/08/2024 FINDINGS: No bowel dilatation or evidence of obstruction. Air and small volume of stool throughout nondilated colon. Small volume of stool in the rectum. Multiple surgical clips in the left upper quadrant. A few surgical clips in the pelvis. Right upper quadrant biliary stent. No free intra-abdominal air. IMPRESSION: Small volume formed stool in the colon. No bowel obstruction. Electronically Signed   By: Andrea Gasman M.D.   On: 07/01/2024 00:03   IR CONVERT BILIARY DRAIN TO INT EXT BILIARY DRAIN Result Date: 06/29/2024 INDICATION: Biliary obstruction in a patient with an indwelling biliary drain previously placed 06/09/2024. On 06/22/2024, the patient returned for further evaluation to find that the catheter had partially retracted internally with a redundant loop of the 10 [place] biliary drain extra capsular to the  liver. Despite multiple attempts to gain access across the common bile duct obstruction, the biliary drainage catheter would not track across this obstruction and a drain was left in the common bile duct. The patient returns today for further evaluation and potential new access or converting the catheter to a better positioned internal/external biliary drainage catheter EXAM: Drain exchange COMPARISON:  None Available. ANESTHESIA/SEDATION: Moderate (conscious) sedation was employed during this procedure. A total of Versed  3 mg and Fentanyl  200 mcg was administered intravenously by the radiology nurse. Total intra-service moderate Sedation Time: 30 minutes. The patient's level of consciousness and vital signs were monitored continuously by radiology nursing throughout the procedure under my direct supervision. CONTRAST:  25 mL Omnipaque  300-administered into the collecting system(s) FLUOROSCOPY: Radiation Exposure Index (as provided by the fluoroscopic device): 406 mGy Kerma COMPLICATIONS: None immediate. PROCEDURE: Informed written consent was obtained from the patient after a thorough discussion of the procedural risks, benefits and alternatives. All questions were addressed. Maximal Sterile Barrier Technique was utilized including caps, mask, sterile gowns, sterile gloves, sterile drape, hand hygiene and skin antiseptic. A timeout was performed prior to the initiation of the procedure. In a supine position, the right upper quadrant and external portion of the catheter were prepped and draped in usual sterile fashion. Dilute contrast was injected into the indwelling biliary drain filling the right lobe bile ducts as well as the common bile duct. Common bile duct is markedly dilated with moderate dilatation of the intrahepatic ducts. Multiple attempts were then made to advance a guidewire through the pigtail catheter and out through the ampulla into the duodenal without success. The pigtail catheter was cut and  removed over a Bentson guidewire. An angled glide catheter was then advanced over the Bentson and attempts at reducing the redundant extra capsular loop were unsuccessful. The catheter was then directed toward the ampulla within the common bile duct and using  a combination of Glidewire and Bentson guidewire finally gaining access into the duodenum. Once duodenal access was obtained, contrast was injected verifying intraluminal position within the bowel. The Bentson guidewire was then advanced into the jejunum until enough guidewire was demonstrated to be looping redundantly within the jejunum giving a long length of guidewire position within the small bowel. The glide catheter was then withdrawn. Slow gentle traction was then applied to the guidewire with minimal reduction in the redundant extra capsular loop. Rapid and somewhat forceful traction was then applied to the guidewire and the redundant loop was completely reduced. At this point a 6 French 25 cm sheath was advanced over the guidewire into the common bile duct and directed toward the ampulla. The introducer was then removed and the angled glide catheter was advanced over the guidewire into the jejunum. The guidewire was then removed and exchanged for an Amplatz wire. The sheath and catheter were then completely removed leaving the guidewire in position. An 8 [place] by 40 cm skater biliary drain was advanced over the guidewire and coiled within the duodenal. Contrast was again injected demonstrating final position. Retention suture was applied. Sterile dressing applied. Catheter was connected to gravity drainage. IMPRESSION: Satisfactory reduction of the extra capsular loop and a advancement of the distal tip of the catheter into the bowel completing the internal/external biliary drain placement in a good position for full function. The catheter will remain connected to gravity drainage. The patient is being admitted for tachycardia, worsening renal  function and likely underlying sepsis. IR will continue to monitor bilirubin and drain function. Plan for capping once bilirubin has normalized. The patient had previous difficulty with the internal external drain care and may need extra education and support. Short-term follow-up in the clinic after discharge would likely be beneficial. The patient will be scheduled for a 4 week return for drain change and evaluation as well. Electronically Signed   By: Cordella Banner   On: 06/29/2024 14:14   IR EXCHANGE BILIARY DRAIN Result Date: 06/22/2024 INDICATION: Status post percutaneous biliary drainage procedure with placement of 10 French internal/external biliary drainage catheter via right lobe percutaneous biliary access on 06/09/2024. There is significant leakage of bile around the tube exit site currently. EXAM: EXCHANGE OF BILIARY DRAINAGE CATHETER UNDER FLUOROSCOPY INCLUDING CHOLANGIOGRAM MEDICATIONS: None ANESTHESIA/SEDATION: None FLUOROSCOPY: Radiation Exposure Index (as provided by the fluoroscopic device): 541 mGy Kerma CONTRAST:  25 mL Omnipaque  300 COMPLICATIONS: None immediate. PROCEDURE: Informed written consent was obtained from the patient after a thorough discussion of the procedural risks, benefits and alternatives. All questions were addressed. Maximal Sterile Barrier Technique was utilized including caps, mask, sterile gowns, sterile gloves, sterile drape, hand hygiene and skin antiseptic. A timeout was performed prior to the initiation of the procedure. Initial fluoroscopy was performed of the indwelling biliary drainage catheter. The exiting drain was injected with contrast. The drainage catheter was then removed over a guidewire and a 5 French catheter advanced over the wire. Contrast was injected via the catheter to opacify the drain tract. The catheter was then advanced over a hydrophilic guidewire into right intrahepatic bile ducts followed by the common bile duct. The 5 French catheter was  further advanced to the level of the duodenum and a guidewire advanced into the duodenum. A 10 French internal/external biliary drainage catheter was attempted to be advanced over various guidewires. Ultimately, a 10 [place] multipurpose drainage catheter was advanced over a guidewire and partially formed. The catheter was injected with contrast to confirm final  positioning and attached to a gravity drainage bag. The catheter was secured at the skin exit site with a Prolene retention suture and StatLock device. FINDINGS: Initial fluoroscopy demonstrates severe retraction of the previously placed internal/external biliary drain nearly out of the liver with some drainage side holes located outside of the costal margin. Injection of contrast barely opacifies the liver. A 5 French catheter was placed and contrast injection did opacify a tract back into right intrahepatic bile ducts. There is a persistent redundant and tortuous tract peripheral to bile ducts outside of the liver. 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 Despite access through to the level of the duodenum, a 10 [place] internal/external biliary drain could not be fully advanced into the duodenum due to buckling of guidewire and catheter outside of the liver which prevented the drain to cross the distal CBD stricture. Ultimately, a standard 10 [place] multipurpose drain was advanced with sideholes predominantly in right-sided intrahepatic ducts and the distal portion of the catheter extending just to the origin of the common bile duct. This drain is draining bile and will be left to gravity drainage. As this drain will not be adequate for long-term drainage, the patient will be brought back for either re-attempted conversion to an internal/external biliary drain or new biliary drain placement via new percutaneous  biliary access under moderate IV conscious sedation. IMPRESSION: 1. Previously placed internal/external biliary drainage catheter had retracted nearly out of the liver and bile ducts. 2. Tract back into right intrahepatic bile ducts and across the CBD was not able to be recanalized by a 5 French catheter to the level of the duodenum. 3. A 10 French internal/external biliary drainage catheter could not be advanced into the duodenum due to buckling of guidewire and catheter outside of the liver, per venting the drain to cross the CBD. 4. A standard 10 [place] multipurpose drain was advanced into right-sided intrahepatic ducts and just to the level of the origin of the common bile duct. This will be left to external gravity drainage. 5. The patient will be brought back for re-attempted conversion versus new percutaneous biliary drainage catheter placement under moderate IV conscious sedation. Electronically Signed   By: Marcey Moan M.D.   On: 06/22/2024 15:54    PERFORMANCE STATUS (ECOG) : 4 - Bedbound  Review of Systems Unless otherwise noted, a complete review of systems is negative.  Physical Exam General: NAD Cardiovascular: regular rate and rhythm Pulmonary: clear ant fields Abdomen: soft, nontender, + bowel sounds GU: no suprapubic tenderness Extremities: no edema, no joint deformities Skin: no rashes Neurological: Weakness but otherwise nonfocal  IMPRESSION: Follow-up visit.    Oral intake remains poor.  Patient voiced frustration as she was so spaghetti, which she describes as her favorite meal, but was unable to eat any.  Initially, patient says that she had decided that she would go home, pray, and live out her days for however long she has.  However, she then said that she would be interested in pursuing surgical J-tube given her ongoing poor oral intake.  Patient also still seems undecided on whether to pursue biopsy, despite an understanding that she is likely not a treatment  candidate at this point.  PLAN: - Recommend best supportive care - Patient interested in surgical J-tube - Will follow  Case and plan discussed with Dr. Melanee and  Dr. Leesa   Time Total: 25 minutes  Visit consisted of counseling and education dealing with the complex and emotionally intense issues of symptom management and palliative care in the setting of serious and potentially life-threatening illness.Greater than 50%  of this time was spent counseling and coordinating care related to the above assessment and plan.  Signed by: Fonda Mower, PhD, NP-C

## 2024-07-20 NOTE — Consult Note (Signed)
 Patient ID: Alyssa Leonard, female   DOB: 01-06-1956, 68 y.o.   MRN: [id number]  HPI Alyssa Leonard is a 68 y.o. female  is a pleasant 68 y.o. 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 He was seen at [facility] and started on immunotherapy.  Cancer was deemed nonoperable.  She does have large renal cell carcinoma extending to the duodenum.  She does also have IPMN of the pancreas that has enlarged and is concerning for cancer and possible lung nodules. She is fully aware that potential feeding tube placement is purely palliative. She reports that she did well with prior gastric sleeve and she also had an open cholecystectomy as a teenager She also had a common bile duct stent recently by interventional radiology and her bili obstruction is improving. Recent CT scan personal review showing evidence of a large right renal mass that is increasing in size as well as increasing pancreatic mass  HPI  Past Medical History:  Diagnosis Date   Asthma    Atrial flutter (HCC)    COPD (chronic obstructive pulmonary disease) (HCC)    Diabetes mellitus without complication (HCC)    GERD (gastroesophageal reflux disease)    History of colon polyps    Hypertension    Meniere disease    OSA (obstructive sleep apnea)    Renal cell carcinoma (HCC)    Sleep apnea    Went for sleep study test in January 2018 I haven't heard back from test    Past Surgical History:  Procedure Laterality Date   ABDOMINAL HYSTERECTOMY     BARIATRIC SURGERY     CHOLECYSTECTOMY     COLONOSCOPY WITH PROPOFOL  N/A 12/11/2016   Procedure: COLONOSCOPY WITH  PROPOFOL ;  Surgeon: Lamar ONEIDA Holmes, MD;  Location: [facility] ENDOSCOPY;  Service: Endoscopy;  Laterality: N/A;   COLONOSCOPY WITH PROPOFOL  N/A 11/27/2021   Procedure: COLONOSCOPY WITH PROPOFOL ;  Surgeon: Maryruth Ole ONEIDA, MD;  Location: [facility] ENDOSCOPY;  Service: Endoscopy;  Laterality: N/A;   IR BILIARY STENT(S) EXIST ACCESS INC DILAT CATH EXCH/REMOVAL  07/16/2024   IR CONVERT BILIARY DRAIN TO INT EXT BILIARY DRAIN  06/29/2024   IR EXCHANGE BILIARY DRAIN  06/22/2024   IR INT EXT BILIARY DRAIN WITH CHOLANGIOGRAM  06/09/2024   IR RADIOLOGIST EVAL & MGMT  06/15/2024    Family History  Problem Relation Age of Onset   Hypertension Mother    Kidney disease Sister    Kidney disease Brother    Heart disease Brother    Heart disease Brother    Breast cancer Neg Hx     Social History Social History   Tobacco Use   Smoking status: Former    Current packs/day: 0.00    Types: Cigarettes    Quit date: 11/14/1993    Years since quitting: 30.7   Smokeless tobacco: Never  Vaping Use   Vaping status: Never Used  Substance Use Topics   Alcohol use: No   Drug use: No    Allergies  Allergen Reactions   Latex Other (See Comments), Dermatitis, Hives and Swelling    unknown  Other reaction(s): Other (See Comments)  unknown  Other reaction(s): Other (See  Comments) unknown    unknown   Aspirin Other (See Comments) and Nausea And Vomiting    Makes stomach burn  Other reaction(s): Other (See Comments)  Makes stomach burn  Makes stomach burn    Other reaction(s): Other (See Comments) Makes stomach burn   Plasticized Base [Plastibase]     Current Facility-Administered Medications  Medication Dose Route Frequency Provider Last Rate Last Admin   albuterol  (PROVENTIL ) (2.5 MG/3ML) 0.083% nebulizer solution 2.5 mg  2.5 mg Nebulization Q6H PRN Dail Rankin RAMAN, RPH       amiodarone  (PACERONE ) tablet 200 mg  200 mg Oral Daily Cox, Amy N, DO   200 mg at 07/20/24 0900   apixaban  (ELIQUIS ) tablet 5  mg  5 mg Oral BID Cox, Amy N, DO   5 mg at 07/20/24 0900   bisacodyl  (DULCOLAX) EC tablet 5 mg  5 mg Oral Daily PRN Cox, Amy N, DO       diazepam  (VALIUM ) tablet 2 mg  2 mg Oral BID PRN Cox, Amy N, DO       docusate sodium  (COLACE) capsule 100 mg  100 mg Oral BID Cox, Amy N, DO   100 mg at 07/20/24 0900   famotidine  (PEPCID ) tablet 20 mg  20 mg Oral BID BM & HS PRN Dezii, Alexandra, DO   20 mg at 07/19/24 1402   feeding supplement (ENSURE PLUS HIGH PROTEIN) liquid 237 mL  237 mL Oral TID BM Cox, Amy N, DO   237 mL at 07/15/24 1041   feeding supplement (GLUCERNA SHAKE) (GLUCERNA SHAKE) liquid 237 mL  237 mL Oral TID BM Dezii, Alexandra, DO   237 mL at 07/20/24 0901   insulin  aspart (novoLOG ) injection 0-15 Units  0-15 Units Subcutaneous TID WC Cox, Amy N, DO   2 Units at 07/19/24 1237   insulin  aspart (novoLOG ) injection 0-5 Units  0-5 Units Subcutaneous QHS Cox, Amy N, DO       lactulose  (CHRONULAC ) 10 GM/15ML solution 10 g  10 g Oral TID Dezii, Alexandra, DO   10 g at 07/17/24 1056   lidocaine  (XYLOCAINE ) 1 % (with pres) injection 10 mL  10 mL Intradermal Once [name] Aran, MD       meclizine  (ANTIVERT ) tablet 25 mg  25 mg Oral TID PRN Cox, Amy N, DO       metoCLOPramide  (REGLAN ) injection 5 mg  5 mg Intravenous Q8H Dezii, Alexandra, DO   5 mg at 07/20/24 0528   metoprolol  tartrate (LOPRESSOR ) tablet 25 mg  25 mg Oral BID Cox, Amy N, DO   25 mg at 07/20/24 0901   OLANZapine  (ZYPREXA ) tablet 10 mg  10 mg Oral QHS Borders, Joshua R, NP   10 mg at 07/19/24 2206   oxyCODONE  (Oxy IR/ROXICODONE ) immediate release tablet 5 mg  5 mg Oral Q8H PRN Cox, Amy N, DO   5 mg at 07/20/24 0901   pantoprazole  (PROTONIX ) EC tablet 40 mg  40 mg Oral Daily Borders, Joshua R, NP   40 mg at 07/20/24 0900   senna-docusate (Senokot-S) tablet 1 tablet  1 tablet Oral QHS PRN Cox, Amy N, DO       sodium chloride  flush (NS) 0.9 % injection 10-40 mL  10-40 mL Intracatheter Q12H Dezii, Alexandra, DO   10 mL at 07/20/24 1145    sodium chloride  flush (NS) 0.9 % injection 10-40 mL  10-40 mL Intracatheter PRN Dezii, Lorane, DO         Review of  Systems Full ROS  was asked and was negative except for the information on the HPI  Physical Exam Blood pressure 116/71, pulse 85, temperature 98.4 F (36.9 C), temperature source Oral, resp. rate 15, height 5' (1.524 m), weight 102 kg, SpO2 100%. CONSTITUTIONAL: chronically ill. EYES: Pupils are equal, round, Sclera are non-icteric. EARS, NOSE, MOUTH AND THROAT: The oropharynx is clear. The oral mucosa is pink and moist. Hearing is intact to voice. LYMPH NODES:  Lymph nodes in the neck are normal. RESPIRATORY:  Lungs are clear. There is normal respiratory effort, with equal breath sounds bilaterally, and without pathologic use of accessory muscles. CARDIOVASCULAR: Heart is regular without murmurs, gallops, or rubs. GI: The abdomen is  soft, nontender, and nondistended. There are no palpable masses.  There is a large right subcostal scar .there is no hepatosplenomegaly. There are normal bowel sounds  GU: Rectal deferred.   MUSCULOSKELETAL: Normal muscle strength and tone. No cyanosis or edema.   SKIN: Turgor is good and there are no pathologic skin lesions or ulcers. NEUROLOGIC: Motor and sensation is grossly normal. Cranial nerves are grossly intact. PSYCH:  Oriented to person, place and time. Affect is normal.  Data Reviewed I have personally reviewed the patient's imaging, laboratory findings and medical records.    Assessment/Plan 68 year old female with locally advanced renal cell carcinoma and possible pulmonary as well as duodenal masses and pancreatic mass with some obstructive component.  Very unfortunate situation as I think that her disease is progressing SHe presents with failure to thrive dehydration and in need for enteral nutrition and hydration.  Discussed with the patient and with oncologist in detail.  Given prior gastric of sleeve I would not  necessarily recommend G-tube and alternatively will be jejunostomy.  We can tentatively put on the schedule for robotic jejunostomy tube placement.  She understands that this is only for palliation purposes and she also understands that there is a chance that is technically not feasible.  Given that she was anticoagulated we will transition her to Lovenox  and plan to do robotic jejunostomy this Thursday I personally spent a total of 75 minutes in the care of the patient today including performing a medically appropriate exam/evaluation, counseling and educating, placing orders, referring and communicating with other health care professionals, documenting clinical information in the EHR, independently interpreting and reviewing images studies and coordinating care.    Laneta Luna, MD FACS General Surgeon 07/20/2024, 2:23 PM

## 2024-07-20 NOTE — Progress Notes (Signed)
 [facility]  ROUNDING NOTE   Subjective:   Patient seen resting in bed Alert and oriented No family present Remains on room air Appetite remains poor  Objective:  Vital signs in last 24 hours:  Temp:  [98.4 F (36.9 C)-98.7 F (37.1 C)] 98.4 F (36.9 C) (09/23 0743) Pulse Rate:  [82-85] 85 (09/23 0743) Resp:  [15-18] 15 (09/23 0743) BP: (108-119)/(63-74) 116/71 (09/23 0743) SpO2:  [100 %] 100 % (09/23 0743)  Weight change:  Filed Weights   07/13/24 1612  Weight: 102 kg    Intake/Output: I/O last 3 completed shifts: In: 2461.8 [P.O.:240; I.V.:2221.8] Out: -    Intake/Output this shift:  No intake/output data recorded.  Physical Exam: General: NAD  Head: Normocephalic, atraumatic. Moist oral mucosal membranes  Eyes: Anicteric  Lungs:  Clear to auscultation, normal effort  Heart: Regular rate and rhythm  Abdomen:  Soft, nontender  Extremities: No peripheral edema.  Neurologic: Awake, alert, conversant  Skin: Warm,dry, no rash       Basic Metabolic Panel: Recent Labs  Lab 07/16/24 0543 07/17/24 0319 07/18/24 0442 07/19/24 0416 07/20/24 0238  NA 140 143 144 138 140  K 4.1 4.1 4.3 4.1 4.0  CL 112* 112* 115* 111 112*  CO2 19* 22 21* 20* 21*  GLUCOSE 117* 109* 116* 105* 101*  BUN 36* 33* 35* 43* 44*  CREATININE 2.31* 2.44* 2.60* 2.72* 2.61*  CALCIUM  8.7* 8.7* 8.7* 8.3* 8.4*  MG 2.0 2.0 1.8 1.8 1.8  PHOS 2.8 3.3 2.9 2.7 2.7    Liver Function Tests: Recent Labs  Lab 07/16/24 0543 07/17/24 0319 07/18/24 0442 07/19/24 0416 07/20/24 0238  AST 39 28 25 17 17   ALT 31 26 21 17 15   ALKPHOS 127* 126 121 100 114  BILITOT 1.0 0.9 0.9 1.0 1.1  PROT 6.0* 6.0* 5.5* 5.4* 5.5*  ALBUMIN 2.6* 2.5* 2.2* 2.1* 2.1*   No results for input(s): LIPASE, AMYLASE in the last 168 hours. No results for input(s): AMMONIA in the last 168 hours.  CBC: Recent Labs  Lab 07/16/24 0543 07/17/24 0319 07/18/24 0442 07/19/24 0416 07/20/24 0238  WBC  6.6 6.9 16.4* 12.9* 10.0  NEUTROABS 3.9 4.5 13.3* 9.9* 7.3  HGB 9.6* 10.0* 10.0* 8.8* 8.7*  HCT 30.3* 30.7* 30.0* 26.3* 26.7*  MCV 86.6 85.0 84.0 84.6 85.3  PLT 186 181 171 153 160    Cardiac Enzymes: No results for input(s): CKTOTAL, CKMB, CKMBINDEX, TROPONINI in the last 168 hours.  BNP: Invalid input(s): POCBNP  CBG: Recent Labs  Lab 07/19/24 1143 07/19/24 1639 07/19/24 2139 07/20/24 0747 07/20/24 1203  GLUCAP 129* 99 84 96 109*    Microbiology: Results for orders placed or performed during the hospital encounter of 06/29/24  Culture, blood (x 2)     Status: None   Collection Time: 06/29/24  4:39 PM   Specimen: BLOOD  Result Value Ref Range Status   Specimen Description BLOOD LEFT ANTECUBITAL  Final   Special Requests   Final    BOTTLES DRAWN AEROBIC AND ANAEROBIC Blood Culture adequate volume   Culture   Final    NO GROWTH 5 DAYS Performed at [facility], [address], [place], [place] [address]    Report Status 07/04/2024 FINAL  Final  Culture, blood (Routine X 2) w Reflex to ID Panel     Status: None   Collection Time: 06/29/24  7:02 PM   Specimen: BLOOD  Result Value Ref Range Status   Specimen Description BLOOD  BLOOD LEFT HAND  Final   Special Requests   Final    BOTTLES DRAWN AEROBIC ONLY Blood Culture results may not be optimal due to an inadequate volume of blood received in culture bottles   Culture   Final    NO GROWTH 5 DAYS Performed at [facility], [address] [place], [place] [address]    Report Status 07/04/2024 FINAL  Final    Coagulation Studies: No results for input(s): LABPROT, INR in the last 72 hours.  Urinalysis: No results for input(s): COLORURINE, LABSPEC, PHURINE, GLUCOSEU, HGBUR, BILIRUBINUR, KETONESUR, PROTEINUR, UROBILINOGEN, NITRITE, LEUKOCYTESUR in the last 72 hours.  Invalid input(s): APPERANCEUR    Imaging: No results found.   Medications:     lactated ringers  100 mL/hr at 07/19/24 1417    amiodarone   200 mg Oral Daily   apixaban   5 mg Oral BID   docusate sodium   100 mg Oral BID   feeding supplement  237 mL Oral TID BM   feeding supplement (GLUCERNA SHAKE)  237 mL Oral TID BM   insulin  aspart  0-15 Units Subcutaneous TID WC   insulin  aspart  0-5 Units Subcutaneous QHS   lactulose   10 g Oral TID   lidocaine   10 mL Intradermal Once   metoCLOPramide  (REGLAN ) injection  5 mg Intravenous Q8H   metoprolol  tartrate  25 mg Oral BID   OLANZapine   10 mg Oral QHS   pantoprazole   40 mg Oral Daily   sodium chloride  flush  10-40 mL Intracatheter Q12H   albuterol , bisacodyl , diazepam , famotidine , meclizine , oxyCODONE , senna-docusate, sodium chloride  flush  Assessment/ Plan:  [name] is a 68 y.o.  female with past medical history of hypertension, GERD, OSA, obesity, pAfib, renal cell carcinoma, and malignant neoplasm of the pancreas, who was admitted to [facility] on 07/13/2024 for Dehydration [E86.0] Lack of appetite [R63.0] AKI (acute kidney injury) [N17.9]   Acute kidney injury on chronic kidney disease stage IIIb. Acute kidney injury appears prerenal due to persistent poor oral intake.Baseline creatinine 1.33 with GFR 44. Due to history of renal cell carcinoma and presence of pancreatic mass, palliative care consult would be appropriate to consider goals of care  Creatinine appears stable today.  Will continue IV fluids for now.  No acute indication for dialysis.  We feel that renal replacement therapy without adequate oral intake may not be an appropriate option for this patient. Lab Results  Component Value Date   CREATININE 2.61 (H) 07/20/2024   CREATININE 2.72 (H) 07/19/2024   CREATININE 2.60 (H) 07/18/2024    Intake/Output Summary (Last 24 hours) at 07/20/2024 1259 Last data filed at 07/20/2024 0900 Gross per 24 hour  Intake 1323.98 ml  Output --  Net 1323.98 ml   2. Acute metabolic acidosis, S bicarb 20 on admission.  Likely secondary to kidney injury. Should correct with kidney function.  Slowly correcting, 21.  3. Anemia of chronic kidney disease Lab Results  Component Value Date   HGB 8.7 (L) 07/20/2024    Hemoglobin slightly decreased but stable.  Avoiding ESA's due to cancer history.  4. Hypertension with chronic kidney disease. Prescribed Metoprolol  only.  Blood pressure acceptable, 116/71.   LOS: 4 Rishit Burkhalter 9/23/202512:59 PM

## 2024-07-21 DIAGNOSIS — N179 Acute kidney failure, unspecified: Secondary | ICD-10-CM | POA: Diagnosis not present

## 2024-07-21 LAB — CREATININE, SERUM
Creatinine, Ser: 2.44 mg/dL — ABNORMAL HIGH (ref 0.44–1.00)
GFR, Estimated: 21 mL/min — ABNORMAL LOW (ref >60)

## 2024-07-21 LAB — GLUCOSE, CAPILLARY
Glucose-Capillary: 103 mg/dL — ABNORMAL HIGH (ref 70–99)
Glucose-Capillary: 128 mg/dL — ABNORMAL HIGH (ref 70–99)
Glucose-Capillary: 70 mg/dL (ref 70–99)
Glucose-Capillary: 73 mg/dL (ref 70–99)

## 2024-07-21 MED ORDER — SODIUM CHLORIDE 0.9 % IV SOLN
2.0000 g | INTRAVENOUS | Status: AC
Start: 2024-07-22 — End: 2024-07-23
  Administered 2024-07-22: 2 g via INTRAVENOUS
  Filled 2024-07-21: qty 2

## 2024-07-21 MED ORDER — ENOXAPARIN SODIUM 100 MG/ML IJ SOSY
100.0000 mg | PREFILLED_SYRINGE | INTRAMUSCULAR | Status: AC
Start: 2024-07-21 — End: 2024-07-21
  Administered 2024-07-21: 100 mg via SUBCUTANEOUS
  Filled 2024-07-21: qty 1

## 2024-07-21 MED ORDER — CHLORHEXIDINE GLUCONATE CLOTH 2 % EX PADS
6.0000 | MEDICATED_PAD | Freq: Every day | CUTANEOUS | Status: DC
  Administered 2024-07-22 – 2024-08-03 (×11): 6 via TOPICAL

## 2024-07-21 MED ORDER — LACTATED RINGERS IV SOLN: INTRAVENOUS | Status: AC

## 2024-07-21 NOTE — Plan of Care (Signed)
  Problem: Coping: Goal: Ability to adjust to condition or change in health will improve Outcome: Progressing   Problem: Metabolic: Goal: Ability to maintain appropriate glucose levels will improve Outcome: Progressing   Problem: Skin Integrity: Goal: Risk for impaired skin integrity will decrease Outcome: Progressing   Problem: Health Behavior/Discharge Planning: Goal: Ability to manage health-related needs will improve Outcome: Progressing   Problem: Clinical Measurements: Goal: Ability to maintain clinical measurements within normal limits will improve Outcome: Progressing

## 2024-07-21 NOTE — Progress Notes (Signed)
 PROGRESS NOTE    Alyssa Leonard  FMW:[id number] DOB: February 10, 1956 DOA: 07/13/2024 PCP: [name] LABOR, MD  [id number]  LOS: 5 days   Brief hospital course:   Assessment & Plan: Alyssa Leonard Mae 68 year old female with history of renal cell carcinoma, obstructive jaundice secondary to malignant neoplasm of the pancreas, paroxysmal A-fib on Eliquis , hypertension, OSA, GERD, morbid obesity, who presents to the ED with AKI.  Patient reports that she has had poor p.o. intake since cancer diagnosis.  She also endorses recurrent nausea.  She has recently met with oncology and palliative care and is planning for biopsy of the pancreatic mass.  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  Expanding metallic biliary stent was placed with improved biliary patency, external biliary stent was removed.  Cirrhosis and further complicated by ongoing AKI, and goals of care discussions.   Ultimately patient has opted to proceed with surgical J-tube placement.  AKI on CKD 3B - Multifactorial.  Prerenal secondary to poor p.o. intake, complicated by massive renal mass - Baseline creatinine 0.76  30month ago. - Nephrology consulted  - s/p IV fluids - Not a candidate for renal replacement therapy --plan for J-tube tomorrow to help with consistent hydration  Nausea Abdominal fullness - Secondary to pancreatic mass and large renal mass - Has been on scheduled Reglan  and is having minimal benefit - History of Roux-en-Y procedure, large pancreatic mass, renal mass, not a candidate for PEG tube placement. - Continue with p.o. as tolerated --plan for J-tube tomorrow with GenSurg   Malignant pancreatic mass - Presumed primary cause of all of the above - Patient previously seen [facility] - Closed oncology.  Prior biopsy revealed IPMN and patient was being monitored .  Appears she has now developed pancreatic cancer, but was still pending  biopsy via EUS at time of admission. - CT scan from 8/12 reviewed: Right renal mass 11 x 9 x 9 cm, abutting the adjacent duodenum.  Mass of pancreatic head 3 x 5 cm. - Patient has recently established with oncology.  She was planning for outpatient biopsy but unfortunately with her decline in appetite, worsening kidney function, and poor functional status she would not be a candidate for any cancer therapies at this time.  She is not a surgical candidate.  Please see oncology notes from 9/22 for more detail - Patient was initially considering discharge home with hospice but has now opted for J-tube placement with hopes that this will increase her functional status. - CA 19-9 has resulted and is very elevated. - Palliative care consulted - 9/19: Cholangiogram through biliary drain, bile duct stent placement with covered metallic stent. --pt asked for code status to be changed to Full Code today   Stage III metastatic renal cell carcinoma - Very large renal mass causing compression and upward displacement. - Previously on immunotherapy - Oncology follow-up as above   Paroxysmal A-fib --hold Eliquis  for upcoming surgery   OSA - Continue CPAP at night   COPD, no acute exacerbation  Morbid obesity, BMI 43.92  Hx of DM, not currently active --d/c BG checks and SSI    DVT prophylaxis: Lovenox  SQ Code Status: Full code  Family Communication:  Level of care: Telemetry Medical Dispo:   The patient is from: home Anticipated d/c is to: home Anticipated d/c date is: 2 days   Subjective and Interval History:  Pt requested her code status to be changed to Full code today.  Pt said she wanted everything done to keep her alive and to be resuscitated.    Objective: Vitals:   07/20/24 1927 07/21/24 0331 07/21/24 0821 07/21/24 1654  BP: 129/76 125/72 112/68 126/83  Pulse: 84 94 85 93  Resp: 17 16 18 17   Temp: 98 F (36.7 C) 98.3 F (36.8 C) 98.5 F (36.9 C) 98.6 F (37 C)  TempSrc:  Oral Oral Oral   SpO2: 100% 99% 100% 100%  Weight:      Height:        Intake/Output Summary (Last 24 hours) at 07/21/2024 1959 Last data filed at 07/21/2024 1943 Gross per 24 hour  Intake 2324.3 ml  Output --  Net 2324.3 ml   Filed Weights   07/13/24 1612  Weight: 102 kg    Examination:   Constitutional: NAD, AAOx3 HEENT: conjunctivae and lids normal, EOMI CV: No cyanosis.   RESP: normal respiratory effort, on RA Neuro: II - XII grossly intact.   Psych: Normal mood and affect.     Data Reviewed: I have personally reviewed labs and imaging studies  Time spent: 50 minutes  Ellouise Haber, MD [facility] If 7PM-7AM, please contact night-coverage 07/21/2024, 7:59 PM

## 2024-07-21 NOTE — Progress Notes (Signed)
   07/21/24 1005  Spiritual Encounters  Type of Visit Initial  Care provided to: Patient  Conversation partners present during encounter Nurse  Referral source Other (comment) (Spiritual Consult)  Reason for visit Advance directives  Spiritual Framework  Presenting Themes Goals in life/care;Values and beliefs;Significant life change  Interventions  Spiritual Care Interventions Made Established relationship of care and support;Compassionate presence;Prayer  Intervention Outcomes  Outcomes Connection to spiritual care;Awareness around self/spiritual resourses  Spiritual Care Plan  Spiritual Care Issues Still Outstanding No further spiritual care needs at this time (see row info)   Chaplain responded to spiritual care consult to pt who has advance directives and wants to change her wishes, however, pt is DNR-Limited and chaplain explained to the pt that only the doctor can change your DNR-Limited status. Pt then asked for prayer and chaplain prayed for pt.

## 2024-07-21 NOTE — Progress Notes (Signed)
 [facility]  ROUNDING NOTE   Subjective:   Patient seen resting in bed No family present Alert and oriented Appetite remains poor Patient states she will receive a feeding tube in a.m.  Creatinine 2.44  Objective:  Vital signs in last 24 hours:  Temp:  [97.6 F (36.4 C)-98.5 F (36.9 C)] 98.5 F (36.9 C) (09/24 0821) Pulse Rate:  [83-94] 85 (09/24 0821) Resp:  [15-18] 18 (09/24 0821) BP: (111-129)/(67-76) 112/68 (09/24 0821) SpO2:  [99 %-100 %] 100 % (09/24 0821)  Weight change:  Filed Weights   07/13/24 1612  Weight: 102 kg    Intake/Output: I/O last 3 completed shifts: In: 3639.1 [P.O.:480; I.V.:3159.1] Out: -    Intake/Output this shift:  No intake/output data recorded.  Physical Exam: General: NAD  Head: Normocephalic, atraumatic. Moist oral mucosal membranes  Eyes: Anicteric  Lungs:  Clear to auscultation, normal effort  Heart: Regular rate and rhythm  Abdomen:  Soft, nontender  Extremities: No peripheral edema.  Neurologic: Awake, alert, conversant  Skin: Warm,dry, no rash       Basic Metabolic Panel: Recent Labs  Lab 07/16/24 0543 07/17/24 0319 07/18/24 0442 07/19/24 0416 07/20/24 0238 07/21/24 0435  NA 140 143 144 138 140  --   K 4.1 4.1 4.3 4.1 4.0  --   CL 112* 112* 115* 111 112*  --   CO2 19* 22 21* 20* 21*  --   GLUCOSE 117* 109* 116* 105* 101*  --   BUN 36* 33* 35* 43* 44*  --   CREATININE 2.31* 2.44* 2.60* 2.72* 2.61* 2.44*  CALCIUM  8.7* 8.7* 8.7* 8.3* 8.4*  --   MG 2.0 2.0 1.8 1.8 1.8  --   PHOS 2.8 3.3 2.9 2.7 2.7  --     Liver Function Tests: Recent Labs  Lab 07/16/24 0543 07/17/24 0319 07/18/24 0442 07/19/24 0416 07/20/24 0238  AST 39 28 25 17 17   ALT 31 26 21 17 15   ALKPHOS 127* 126 121 100 114  BILITOT 1.0 0.9 0.9 1.0 1.1  PROT 6.0* 6.0* 5.5* 5.4* 5.5*  ALBUMIN 2.6* 2.5* 2.2* 2.1* 2.1*   No results for input(s): LIPASE, AMYLASE in the last 168 hours. No results for input(s): AMMONIA in the  last 168 hours.  CBC: Recent Labs  Lab 07/16/24 0543 07/17/24 0319 07/18/24 0442 07/19/24 0416 07/20/24 0238  WBC 6.6 6.9 16.4* 12.9* 10.0  NEUTROABS 3.9 4.5 13.3* 9.9* 7.3  HGB 9.6* 10.0* 10.0* 8.8* 8.7*  HCT 30.3* 30.7* 30.0* 26.3* 26.7*  MCV 86.6 85.0 84.0 84.6 85.3  PLT 186 181 171 153 160    Cardiac Enzymes: No results for input(s): CKTOTAL, CKMB, CKMBINDEX, TROPONINI in the last 168 hours.  BNP: Invalid input(s): POCBNP  CBG: Recent Labs  Lab 07/20/24 1203 07/20/24 1615 07/20/24 2154 07/21/24 0818 07/21/24 1145  GLUCAP 109* 116* 127* 103* 128*    Microbiology: Results for orders placed or performed during the hospital encounter of 06/29/24  Culture, blood (x 2)     Status: None   Collection Time: 06/29/24  4:39 PM   Specimen: BLOOD  Result Value Ref Range Status   Specimen Description BLOOD LEFT ANTECUBITAL  Final   Special Requests   Final    BOTTLES DRAWN AEROBIC AND ANAEROBIC Blood Culture adequate volume   Culture   Final    NO GROWTH 5 DAYS Performed at [facility], [address], [place], [place] [address]    Report Status 07/04/2024 FINAL  Final  Culture, blood (Routine X 2) w Reflex to ID Panel     Status: None   Collection Time: 06/29/24  7:02 PM   Specimen: BLOOD  Result Value Ref Range Status   Specimen Description BLOOD BLOOD LEFT HAND  Final   Special Requests   Final    BOTTLES DRAWN AEROBIC ONLY Blood Culture results may not be optimal due to an inadequate volume of blood received in culture bottles   Culture   Final    NO GROWTH 5 DAYS Performed at [facility], [address] [place], [place] [address]    Report Status 07/04/2024 FINAL  Final    Coagulation Studies: No results for input(s): LABPROT, INR in the last 72 hours.  Urinalysis: No results for input(s): COLORURINE, LABSPEC, PHURINE, GLUCOSEU, HGBUR, BILIRUBINUR, KETONESUR, PROTEINUR, UROBILINOGEN, NITRITE,  LEUKOCYTESUR in the last 72 hours.  Invalid input(s): APPERANCEUR    Imaging: No results found.   Medications:    [START ON 07/22/2024] cefoTEtan  (CEFOTAN ) IV     lactated ringers  100 mL/hr at 07/21/24 0259    amiodarone   200 mg Oral Daily   Chlorhexidine  Gluconate Cloth  6 each Topical Q0600   docusate sodium   100 mg Oral BID   enoxaparin  (LOVENOX ) injection  100 mg Subcutaneous Q24H   feeding supplement  237 mL Oral TID BM   feeding supplement (GLUCERNA SHAKE)  237 mL Oral TID BM   insulin  aspart  0-15 Units Subcutaneous TID WC   insulin  aspart  0-5 Units Subcutaneous QHS   lactulose   10 g Oral TID   lidocaine   10 mL Intradermal Once   metoCLOPramide  (REGLAN ) injection  5 mg Intravenous Q8H   metoprolol  tartrate  25 mg Oral BID   OLANZapine   10 mg Oral QHS   pantoprazole   40 mg Oral Daily   sodium chloride  flush  10-40 mL Intracatheter Q12H   albuterol , bisacodyl , diazepam , famotidine , meclizine , oxyCODONE , senna-docusate, sodium chloride  flush  Assessment/ Plan:  Alyssa Leonard is a 68 y.o.  female with past medical history of hypertension, GERD, OSA, obesity, pAfib, renal cell carcinoma, and malignant neoplasm of the pancreas, who was admitted to [facility] on 07/13/2024 for Dehydration [E86.0] Lack of appetite [R63.0] AKI (acute kidney injury) [N17.9]   Acute kidney injury on chronic kidney disease stage IIIb. Acute kidney injury appears prerenal due to persistent poor oral intake.Baseline creatinine 1.33 with GFR 44. Due to history of renal cell carcinoma and presence of pancreatic mass, palliative care consult would be appropriate to consider goals of care  Creatinine slightly improved today.  Continue IV fluids.  No acute indication for dialysis.  We feel that renal replacement therapy without adequate oral intake may not be an appropriate option for this patient.  Will monitor progress as feeding tube placement scheduled for tomorrow. Lab Results  Component Value  Date   CREATININE 2.44 (H) 07/21/2024   CREATININE 2.61 (H) 07/20/2024   CREATININE 2.72 (H) 07/19/2024    Intake/Output Summary (Last 24 hours) at 07/21/2024 1237 Last data filed at 07/21/2024 0500 Gross per 24 hour  Intake 2686.29 ml  Output --  Net 2686.29 ml   2. Acute metabolic acidosis, S bicarb 20 on admission. Likely secondary to kidney injury. Should correct with kidney function.    3. Anemia of chronic kidney disease Lab Results  Component Value Date   HGB 8.7 (L) 07/20/2024    Hemoglobin stable.  Avoiding ESA's due to cancer history.  4. Hypertension with chronic kidney  disease. Prescribed Metoprolol  only.  Blood pressure acceptable for this patient   LOS: 5 Michaella Imai 9/24/202512:37 PM

## 2024-07-22 ENCOUNTER — Inpatient Hospital Stay

## 2024-07-22 ENCOUNTER — Other Ambulatory Visit: Payer: Self-pay | Admitting: Oncology

## 2024-07-22 ENCOUNTER — Other Ambulatory Visit: Payer: Self-pay

## 2024-07-22 ENCOUNTER — Encounter
Admission: EM | Disposition: A | Discharge status: Home Health | Payer: Self-pay | Source: Ambulatory Visit | Attending: Family Medicine

## 2024-07-22 ENCOUNTER — Encounter: Payer: Self-pay | Admitting: Internal Medicine

## 2024-07-22 DIAGNOSIS — E46 Unspecified protein-calorie malnutrition: Secondary | ICD-10-CM | POA: Diagnosis not present

## 2024-07-22 DIAGNOSIS — N179 Acute kidney failure, unspecified: Secondary | ICD-10-CM | POA: Diagnosis not present

## 2024-07-22 DIAGNOSIS — Z515 Encounter for palliative care: Secondary | ICD-10-CM | POA: Diagnosis not present

## 2024-07-22 DIAGNOSIS — E860 Dehydration: Secondary | ICD-10-CM | POA: Diagnosis not present

## 2024-07-22 HISTORY — PX: INSERTION, JEJUNOSTOMY TUBE, ROBOT-ASSISTED: SHX7603

## 2024-07-22 LAB — VITAMIN D 25 HYDROXY (VIT D DEFICIENCY, FRACTURES): Vit D, 25-Hydroxy: 36.32 ng/mL (ref 30–100)

## 2024-07-22 LAB — GLUCOSE, CAPILLARY
Glucose-Capillary: 103 mg/dL — ABNORMAL HIGH (ref 70–99)
Glucose-Capillary: 121 mg/dL — ABNORMAL HIGH (ref 70–99)
Glucose-Capillary: 159 mg/dL — ABNORMAL HIGH (ref 70–99)
Glucose-Capillary: 159 mg/dL — ABNORMAL HIGH (ref 70–99)

## 2024-07-22 LAB — CREATININE, SERUM
Creatinine, Ser: 2.33 mg/dL — ABNORMAL HIGH (ref 0.44–1.00)
GFR, Estimated: 22 mL/min — ABNORMAL LOW (ref >60)

## 2024-07-22 LAB — PHOSPHORUS: Phosphorus: 3.3 mg/dL (ref 2.5–4.6)

## 2024-07-22 LAB — VITAMIN B12: Vitamin B-12: 551 pg/mL (ref 180–914)

## 2024-07-22 LAB — IRON: Iron: 21 ug/dL — ABNORMAL LOW (ref 28–170)

## 2024-07-22 LAB — MAGNESIUM: Magnesium: 1.7 mg/dL (ref 1.7–2.4)

## 2024-07-22 LAB — CALCIUM: Calcium: 8.1 mg/dL — ABNORMAL LOW (ref 8.9–10.3)

## 2024-07-22 SURGERY — INSERTION, JEJUNOSTOMY TUBE, ROBOT-ASSISTED
Anesthesia: General

## 2024-07-22 MED ORDER — LIDOCAINE HCL (PF) 2 % IJ SOLN
INTRAMUSCULAR | Status: AC
  Filled 2024-07-22: qty 5

## 2024-07-22 MED ORDER — GLUCERNA SHAKE PO LIQD
237.0000 mL | Freq: Three times a day (TID) | ORAL | Status: DC
  Administered 2024-07-22: 237 mL via ORAL

## 2024-07-22 MED ORDER — MORPHINE SULFATE (PF) 2 MG/ML IV SOLN
2.0000 mg | INTRAVENOUS | Status: DC | PRN
  Administered 2024-07-22 – 2024-07-23 (×3): 2 mg via INTRAVENOUS
  Filled 2024-07-22 (×3): qty 1

## 2024-07-22 MED ORDER — ROCURONIUM BROMIDE 100 MG/10ML IV SOLN
INTRAVENOUS | Status: DC | PRN
  Administered 2024-07-22: 20 mg via INTRAVENOUS
  Administered 2024-07-22: 70 mg via INTRAVENOUS

## 2024-07-22 MED ORDER — ONDANSETRON HCL 4 MG/2ML IJ SOLN
INTRAMUSCULAR | Status: AC
Start: 2024-07-22 — End: 2024-07-22
  Filled 2024-07-22: qty 2

## 2024-07-22 MED ORDER — ENSURE PLUS HIGH PROTEIN PO LIQD: 237.0000 mL | Freq: Two times a day (BID) | ORAL | Status: DC

## 2024-07-22 MED ORDER — APIXABAN 5 MG PO TABS
5.0000 mg | ORAL_TABLET | Freq: Two times a day (BID) | ORAL | Status: DC
Start: 2024-07-22 — End: 2024-07-27
  Administered 2024-07-22 – 2024-07-27 (×10): 5 mg via ORAL
  Filled 2024-07-22 (×10): qty 1

## 2024-07-22 MED ORDER — MIDAZOLAM HCL 2 MG/2ML IJ SOLN
INTRAMUSCULAR | Status: DC | PRN
  Administered 2024-07-22 (×2): 1 mg via INTRAVENOUS

## 2024-07-22 MED ORDER — DEXAMETHASONE SODIUM PHOSPHATE 10 MG/ML IJ SOLN
INTRAMUSCULAR | Status: DC | PRN
  Administered 2024-07-22: 4 mg via INTRAVENOUS

## 2024-07-22 MED ORDER — ROCURONIUM BROMIDE 10 MG/ML (PF) SYRINGE
PREFILLED_SYRINGE | INTRAVENOUS | Status: AC
  Filled 2024-07-22: qty 10

## 2024-07-22 MED ORDER — ONDANSETRON HCL 4 MG/2ML IJ SOLN
INTRAMUSCULAR | Status: DC | PRN
  Administered 2024-07-22: 4 mg via INTRAVENOUS

## 2024-07-22 MED ORDER — CALCIUM CARBONATE ANTACID 500 MG PO CHEW
1.0000 | CHEWABLE_TABLET | Freq: Three times a day (TID) | ORAL | Status: DC
  Administered 2024-07-22 – 2024-07-28 (×16): 200 mg via ORAL
  Filled 2024-07-22 (×19): qty 1

## 2024-07-22 MED ORDER — BUPIVACAINE-EPINEPHRINE (PF) 0.25% -1:200000 IJ SOLN
INTRAMUSCULAR | Status: AC
  Filled 2024-07-22: qty 30

## 2024-07-22 MED ORDER — FREE WATER: 30.0000 mL | Status: DC

## 2024-07-22 MED ORDER — VITAL HP 1.0 CAL PO LIQD: 1000.0000 mL | ORAL | Status: DC

## 2024-07-22 MED ORDER — PHENYLEPHRINE 80 MCG/ML (10ML) SYRINGE FOR IV PUSH (FOR BLOOD PRESSURE SUPPORT)
PREFILLED_SYRINGE | INTRAVENOUS | Status: DC | PRN
  Administered 2024-07-22 (×2): 80 ug via INTRAVENOUS
  Administered 2024-07-22 (×2): 160 ug via INTRAVENOUS
  Administered 2024-07-22 (×3): 80 ug via INTRAVENOUS

## 2024-07-22 MED ORDER — THIAMINE MONONITRATE 100 MG PO TABS
100.0000 mg | ORAL_TABLET | Freq: Every day | ORAL | Status: DC
  Administered 2024-07-22 – 2024-07-27 (×6): 100 mg
  Filled 2024-07-22 (×6): qty 1

## 2024-07-22 MED ORDER — SODIUM CHLORIDE 0.9 % IV SOLN
2.0000 g | Freq: Two times a day (BID) | INTRAVENOUS | Status: AC
  Administered 2024-07-22 – 2024-07-23 (×2): 2 g via INTRAVENOUS
  Filled 2024-07-22 (×2): qty 2

## 2024-07-22 MED ORDER — MIDAZOLAM HCL 2 MG/2ML IJ SOLN
INTRAMUSCULAR | Status: AC
  Filled 2024-07-22: qty 2

## 2024-07-22 MED ORDER — PROPOFOL 10 MG/ML IV BOLUS
INTRAVENOUS | Status: AC
  Filled 2024-07-22: qty 20

## 2024-07-22 MED ORDER — SUGAMMADEX SODIUM 200 MG/2ML IV SOLN
INTRAVENOUS | Status: DC | PRN
  Administered 2024-07-22: 200 mg via INTRAVENOUS

## 2024-07-22 MED ORDER — MORPHINE SULFATE (PF) 2 MG/ML IV SOLN
2.0000 mg | Freq: Once | INTRAVENOUS | Status: AC
  Administered 2024-07-22: 2 mg via INTRAVENOUS
  Filled 2024-07-22: qty 1

## 2024-07-22 MED ORDER — EPHEDRINE SULFATE-NACL 50-0.9 MG/10ML-% IV SOSY
PREFILLED_SYRINGE | INTRAVENOUS | Status: DC | PRN
  Administered 2024-07-22 (×4): 5 mg via INTRAVENOUS

## 2024-07-22 MED ORDER — ACETAMINOPHEN 500 MG PO TABS
1000.0000 mg | ORAL_TABLET | Freq: Four times a day (QID) | ORAL | Status: DC
  Administered 2024-07-22 – 2024-07-24 (×6): 1000 mg via ORAL
  Filled 2024-07-22 (×9): qty 2

## 2024-07-22 MED ORDER — FREE WATER
30.0000 mL | Status: DC
  Administered 2024-07-22 – 2024-07-24 (×11): 30 mL

## 2024-07-22 MED ORDER — FOLIC ACID 1 MG PO TABS
1.0000 mg | ORAL_TABLET | Freq: Every day | ORAL | Status: DC
  Administered 2024-07-22 – 2024-07-31 (×10): 1 mg
  Filled 2024-07-22 (×10): qty 1

## 2024-07-22 MED ORDER — EPHEDRINE 5 MG/ML INJ
INTRAVENOUS | Status: AC
  Filled 2024-07-22: qty 5

## 2024-07-22 MED ORDER — 0.9 % SODIUM CHLORIDE (POUR BTL) OPTIME
TOPICAL | Status: DC | PRN
  Administered 2024-07-22: 1000 mL

## 2024-07-22 MED ORDER — LIDOCAINE HCL (CARDIAC) PF 100 MG/5ML IV SOSY
PREFILLED_SYRINGE | INTRAVENOUS | Status: DC | PRN
  Administered 2024-07-22: 60 mg via INTRAVENOUS
  Administered 2024-07-22: 40 mg via INTRAVENOUS

## 2024-07-22 MED ORDER — OSMOLITE 1.5 CAL PO LIQD
1000.0000 mL | ORAL | Status: DC
  Administered 2024-07-22 – 2024-07-23 (×2): 1000 mL

## 2024-07-22 MED ORDER — ADULT MULTIVITAMIN W/MINERALS CH: 1.0000 | ORAL_TABLET | Freq: Two times a day (BID) | ORAL | Status: DC

## 2024-07-22 MED ORDER — OSMOLITE 1.5 CAL PO LIQD: 1000.0000 mL | ORAL | Status: DC

## 2024-07-22 MED ORDER — DEXAMETHASONE SODIUM PHOSPHATE 10 MG/ML IJ SOLN
INTRAMUSCULAR | Status: AC
  Filled 2024-07-22: qty 1

## 2024-07-22 MED ORDER — BUPIVACAINE-EPINEPHRINE 0.25% -1:200000 IJ SOLN
INTRAMUSCULAR | Status: DC | PRN
  Administered 2024-07-22: 30 mL

## 2024-07-22 MED ORDER — ADULT MULTIVITAMIN W/MINERALS CH
1.0000 | ORAL_TABLET | Freq: Two times a day (BID) | ORAL | Status: DC
  Administered 2024-07-22 – 2024-07-31 (×19): 1
  Filled 2024-07-22 (×19): qty 1

## 2024-07-22 MED ORDER — SODIUM CHLORIDE 0.9 % IV SOLN: INTRAVENOUS | Status: DC | PRN

## 2024-07-22 MED ORDER — CEFOTETAN DISODIUM 2 G IJ SOLR
INTRAMUSCULAR | Status: AC
  Filled 2024-07-22: qty 2

## 2024-07-22 MED ORDER — ACETAMINOPHEN 500 MG PO TABS: 1000.0000 mg | ORAL_TABLET | Freq: Four times a day (QID) | ORAL | Status: DC

## 2024-07-22 MED ORDER — FENTANYL CITRATE (PF) 100 MCG/2ML IJ SOLN
INTRAMUSCULAR | Status: AC
  Filled 2024-07-22: qty 2

## 2024-07-22 MED ORDER — FENTANYL CITRATE (PF) 100 MCG/2ML IJ SOLN
INTRAMUSCULAR | Status: DC | PRN
  Administered 2024-07-22: 25 ug via INTRAVENOUS
  Administered 2024-07-22: 50 ug via INTRAVENOUS
  Administered 2024-07-22: 25 ug via INTRAVENOUS

## 2024-07-22 MED ORDER — PROPOFOL 10 MG/ML IV BOLUS
INTRAVENOUS | Status: DC | PRN
  Administered 2024-07-22: 170 mg via INTRAVENOUS
  Administered 2024-07-22: 30 mg via INTRAVENOUS

## 2024-07-22 SURGICAL SUPPLY — 53 items
CANNULA REDUCER 12-8 DVNC XI (CANNULA) ×1 IMPLANT
CATH FOLEY 2WAY 5CC 18FR (CATHETERS) IMPLANT
COVER TIP SHEARS 8 DVNC (MISCELLANEOUS) ×1 IMPLANT
COVER WAND RF STERILE (DRAPES) ×1 IMPLANT
DEFOGGER SCOPE WARM SEASHARP (MISCELLANEOUS) ×1 IMPLANT
DERMABOND ADVANCED .7 DNX12 (GAUZE/BANDAGES/DRESSINGS) ×1 IMPLANT
DRAPE ARM DVNC X/XI (DISPOSABLE) ×3 IMPLANT
DRAPE COLUMN DVNC XI (DISPOSABLE) ×1 IMPLANT
ELECTRODE REM PT RTRN 9FT ADLT (ELECTROSURGICAL) ×1 IMPLANT
FORCEPS BPLR R/ABLATION 8 DVNC (INSTRUMENTS) ×1 IMPLANT
G-TUBE MIC 18FR ENFIT ADLT (TUBING) IMPLANT
G-TUBE MIC ADLT 16FR ENFIT (TUBING) IMPLANT
GLOVE BIO SURGEON STRL SZ7 (GLOVE) ×2 IMPLANT
GOWN STRL REUS W/ TWL LRG LVL3 (GOWN DISPOSABLE) ×3 IMPLANT
GRASPER TIP-UP FEN DVNC XI (INSTRUMENTS) ×1 IMPLANT
IRRIGATION STRYKERFLOW (MISCELLANEOUS) IMPLANT
KIT PINK PAD W/HEAD ARM REST (MISCELLANEOUS) ×1 IMPLANT
LABEL OR SOLS (LABEL) ×1 IMPLANT
MANIFOLD NEPTUNE II (INSTRUMENTS) ×1 IMPLANT
NDL DRIVE SUT CUT DVNC (INSTRUMENTS) ×1 IMPLANT
NDL HYPO 22X1.5 SAFETY MO (MISCELLANEOUS) ×1 IMPLANT
NDL INSUFFLATION 14GA 120MM (NEEDLE) ×1 IMPLANT
NEEDLE DRIVE SUT CUT DVNC (INSTRUMENTS) ×1 IMPLANT
NEEDLE HYPO 22X1.5 SAFETY MO (MISCELLANEOUS) ×1 IMPLANT
NEEDLE INSUFFLATION 14GA 120MM (NEEDLE) ×1 IMPLANT
OBTURATOR OPTICALSTD 8 DVNC (TROCAR) ×1 IMPLANT
PACK LAP CHOLECYSTECTOMY (MISCELLANEOUS) ×1 IMPLANT
PLUG CATH AND CAP STRL 200 (CATHETERS) IMPLANT
SCISSORS LAP 5X35 DISP (ENDOMECHANICALS) IMPLANT
SCISSORS MNPLR CVD DVNC XI (INSTRUMENTS) ×1 IMPLANT
SEAL UNIV 5-12 XI (MISCELLANEOUS) ×3 IMPLANT
SEALER VESSEL EXT DVNC XI (MISCELLANEOUS) IMPLANT
SET TUBE SMOKE EVAC HIGH FLOW (TUBING) ×1 IMPLANT
SOLUTION ELECTROSURG ANTI STCK (MISCELLANEOUS) ×1 IMPLANT
SPONGE DRAIN TRACH 4X4 STRL 2S (GAUZE/BANDAGES/DRESSINGS) ×1 IMPLANT
SPONGE T-LAP 18X18 ~~LOC~~+RFID (SPONGE) ×1 IMPLANT
SPONGE T-LAP 4X18 ~~LOC~~+RFID (SPONGE) ×1 IMPLANT
SUT STRATA 2-0 23CM CT-2 (SUTURE) ×1 IMPLANT
SUT STRATA 3-0 SH (SUTURE) ×1 IMPLANT
SUT STRATA PDS 2-0 23 CT-1 (SUTURE) ×1 IMPLANT
SUT VIC AB 2-0 SH 27XBRD (SUTURE) IMPLANT
SUT VICRYL 0 UR6 27IN ABS (SUTURE) ×2 IMPLANT
SUTURE EHLN 3-0 FS-10 30 BLK (SUTURE) ×1 IMPLANT
SUTURE MNCRL 4-0 27XMF (SUTURE) ×1 IMPLANT
SYR 20ML LL LF (SYRINGE) ×1 IMPLANT
SYR 30ML LL (SYRINGE) ×1 IMPLANT
TAPE TRANSPORE STRL 2 31045 (GAUZE/BANDAGES/DRESSINGS) ×1 IMPLANT
TRAP FLUID SMOKE EVACUATOR (MISCELLANEOUS) ×1 IMPLANT
TROCAR Z-THREAD FIOS 5X100MM (TROCAR) IMPLANT
TUBE GSTRM 10.5X14FR YPRT (TUBING) IMPLANT
ULTRASOUND BK UROLOGY (MISCELLANEOUS) ×1 IMPLANT
ULTRASOUND BK UROLOGY PROCEDUR (MISCELLANEOUS) IMPLANT
WATER STERILE IRR 500ML POUR (IV SOLUTION) ×1 IMPLANT

## 2024-07-22 NOTE — Anesthesia Postprocedure Evaluation (Signed)
 Anesthesia Post Note  Patient: Alyssa Leonard  Procedure(s) Performed: INSERTION, JEJUNOSTOMY TUBE, ROBOT-ASSISTED  Patient location during evaluation: PACU Anesthesia Type: General Level of consciousness: awake and alert Pain management: pain level controlled Vital Signs Assessment: post-procedure vital signs reviewed and stable Respiratory status: spontaneous breathing, nonlabored ventilation and respiratory function stable Cardiovascular status: blood pressure returned to baseline and stable Postop Assessment: no apparent nausea or vomiting Anesthetic complications: no   No notable events documented.   Last Vitals:  Vitals:   07/22/24 1130 07/22/24 1146  BP: 135/85 137/84  Pulse: 93 91  Resp: (!) 21 16  Temp:  37 C  SpO2: 100% 99%    Last Pain:  Vitals:   07/22/24 1146  TempSrc: Oral  PainSc:                  Camellia Merilee Louder

## 2024-07-22 NOTE — Progress Notes (Addendum)
 Initial Nutrition Assessment  DOCUMENTATION CODES:   Morbid obesity  INTERVENTION:   -Glucerna Shake po TID, each supplement provides 220 kcal and 10 grams of protein  -D/c Ensure Plus High Protein po BID, each supplement provides 350 kcal and 20 grams of protein  -MVI with minerals BID -500 mg calcium  carbonate TID -Continue regular diet -TF via j-tube:   Initiate Osmolite 1.5 @ 20 ml/hr (trickles per MD)  Once able, increase by 10 ml every 12 hours to goal rate of 50 ml/hr.   60 ml Prosource TF20 daily  30 ml free water  flush every 4 hours  Tube feeding regimen provides 1880 kcal (100% of needs), 95 grams of protein, and 914 ml of H2O. Total free water : 1094 ml daily   -100 mg thiamine  daily x 7 days -Monitor Mg, K, and Phos and replete as needed secondary to high refeeding risk -Pt with history of gastric sleeve with poor oral intake and significant weight loss. RD will draw labs to rule out potential micronutrient deficiencies: iron, folic acid , thiamine , copper , zinc , vitamin B-12, vitamin D , vitamin A, vitamin E, vitamin A, and calcium  -When transitioning to home, recommend transition to nocturnal feedings. Pt is not a candidate for bolus feedings secondary to j-tube:  Osmolite 1.5 @ 80 ml/hr x 16 hours  30 ml free water  flush every 4 hours  Tube feeding regimen provides 1920 kcals, 80 grams of protein, and 975 ml of H2O.  Total free water : 1155 ml free water  daily   NUTRITION DIAGNOSIS:   Increased nutrient needs related to chronic illness (pancreatic mass) as evidenced by estimated needs.  GOAL:   Patient will meet greater than or equal to 90% of their needs  MONITOR:   PO intake, Supplement acceptance, TF tolerance  REASON FOR ASSESSMENT:   Consult Enteral/tube feeding initiation and management  ASSESSMENT:   Pt with history of renal cell carcinoma, obstructive jaundice secondary to malignant neoplasm, mass in the pancreas, paroxysmal atrial  fibrillation on Eliquis , hypertension, obstructive sleep apnea, GERD, morbid obesity, who presents for chief concerns of acute kidney injury.  Pt admitted with AKI and failure to thrive.   9/19- s/p Cholangiogram through biliary drain; common bile duct stent placement with covered metallic stent  9/25- s/p j-tube placement by surgery  Reviewed I/O's: +1.5 L x 24 hours and +9.2 L since admission  Case discussed with RN and general surgery. J-tube has been placed today. Plan to start trickle feeds this evening with plans to possible start advancing feeds tomorrow.   Pt lying in bed at time of visit. Pt just came back from j-tube placement and was lethargic at time of visit. She was unable to arouse enough to participate in conversation.   Per H&P, pt with pancreatic mass with obstructive jaundice status post biliary drain. Oncology reports pancreatic mass had grown further with a CA 19-9 that was significantly elevated raising the concern for pancreatic cancer. This pancreatic mass was also abutting the gastric antrum and duodenum. This was complicated by obstructive jaundice requiring PTC which was recently changed for an internal stent by IR.   Per general surgery notes, pt with prior history of gastric sleeve and recommended g-tube placement. Per [facility]where, pt underwent gastric sleeve on 10/15/24 at [facility].   Palliative care following for goals of care. Pt is still undecided but plan for supportive care for now. She is unsure if she wants to pursue biopsy.   Staff reports history of poor oral intake. Pt complained  of being unable to eat spaghetti a few days ago (which is her favorite meal). Noted meal completions 0-100%.   Reviewed wt hx; pt has experienced a 19.2% wt loss over the past 3 months, which is significant for time frame. Noted pt with mild edema, which is likely masking further weight loss as well as fat and muscle depletions.   ADDENDUM (1630): Case discussed with MD.  Per palliative care notes, plan to transition home with hospice. MD requesting assistance with transitions of care for home re: home tube feeding and husband has multiple questions regarding feedings. RD was not on site at the time message was received; RD called into pt room, however, did not receive answer. Pt would not be a candidate for bolus feeds due to j-tube (would require continuous vs nocturnal feeds). TF will not start tonight, but will place recommendations for home per request of TOC.   Medications reviewed and include colace, lactulose , reglan , protonix , and lactated ringers  @ 100 ml/hr.   Labs reviewed: CBGS: 70-128 (inpatient orders for glycemic control are none).    NUTRITION - FOCUSED PHYSICAL EXAM:  Flowsheet Row Most Recent Value  Orbital Region No depletion  Upper Arm Region No depletion  Thoracic and Lumbar Region No depletion  Buccal Region No depletion  Temple Region No depletion  Clavicle Bone Region No depletion  Clavicle and Acromion Bone Region No depletion  Scapular Bone Region No depletion  Dorsal Hand No depletion  Patellar Region No depletion  Anterior Thigh Region No depletion  Posterior Calf Region No depletion  Edema (RD Assessment) Mild  Hair Reviewed  Eyes Reviewed  Mouth Reviewed  Skin Reviewed  Nails Reviewed    Diet Order:   Diet Order             Diet regular Room service appropriate? Yes; Fluid consistency: Thin  Diet effective now                   EDUCATION NEEDS:   Not appropriate for education at this time  Skin:  Skin Assessment: Skin Integrity Issues: Skin Integrity Issues:: Incisions Incisions: closed abdomen  Last BM:  07/22/24  Height:   Ht Readings from Last 1 Encounters:  07/22/24 5' (1.524 m)    Weight:   Wt Readings from Last 1 Encounters:  07/22/24 102 kg    Ideal Body Weight:  45.5 kg  BMI:  Body mass index is 43.92 kg/m.  Estimated Nutritional Needs:   Kcal:  1800-2000  Protein:  90-105  grams  Fluid:  1.8-2.0 L    Margery ORN, RD, LDN, CDCES Registered Dietitian III Certified Diabetes Care and Education Specialist If unable to reach this RD, please use RD Inpatient group chat on secure chat between hours of 8am-4 pm daily

## 2024-07-22 NOTE — Progress Notes (Signed)
 Preoperative Review   Patient is met in the preoperative holding area. The history is reviewed in the chart and with the patient. I personally reviewed the options and rationale as well as the risks of this procedure that have been previously discussed with the patient. All questions asked by the patient and/or family were answered to their satisfaction.  Patient agrees to proceed with this procedure at this time.  Laneta Luna M.D. FACS

## 2024-07-22 NOTE — Anesthesia Procedure Notes (Signed)
 Procedure Name: Intubation Date/Time: 07/22/2024 7:54 AM  Performed by: Trudy Rankin LABOR, CRNAPre-anesthesia Checklist: Patient identified, Patient being monitored, Timeout performed, Emergency Drugs available and Suction available Patient Re-evaluated:Patient Re-evaluated prior to induction Oxygen Delivery Method: Circle System Utilized Preoxygenation: Pre-oxygenation with 100% oxygen Induction Type: IV induction and Rapid sequence Laryngoscope Size: Mac, 3 and McGrath Grade View: Grade I Tube type: Oral Tube size: 7.0 mm Number of attempts: 1 Airway Equipment and Method: Stylet Placement Confirmation: ETT inserted through vocal cords under direct vision, positive ETCO2 and breath sounds checked- equal and bilateral Secured at: 20 cm Tube secured with: Tape Dental Injury: Teeth and Oropharynx as per pre-operative assessment

## 2024-07-22 NOTE — Progress Notes (Signed)
 PROGRESS NOTE    Alyssa Leonard  FMW:[id number] DOB: [birth year] DOA: 07/13/2024 PCP: [name] LABOR, MD  [id number]  LOS: 6 days   Brief hospital course:   Assessment & Plan: Alyssa Leonard Mae 68 year old female with history of renal cell carcinoma, obstructive jaundice secondary to malignant neoplasm of the pancreas, paroxysmal A-fib on Eliquis , hypertension, OSA, GERD, morbid obesity, who presents to the ED with AKI.  Patient reports that she has had poor p.o. intake since cancer diagnosis.  She also endorses recurrent nausea.  She has recently met with oncology and palliative care and is planning for biopsy of the pancreatic mass.  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  Expanding metallic biliary stent was placed with improved biliary patency, external biliary stent was removed.  Cirrhosis and further complicated by ongoing AKI, and goals of care discussions.   Ultimately patient has opted to proceed with surgical J-tube placement.  AKI on CKD 3B - Multifactorial.  Prerenal secondary to poor p.o. intake, complicated by massive renal mass - Baseline creatinine 0.76  30month ago. - Nephrology consulted  - s/p IV fluids - Not a candidate for renal replacement therapy --J-tube placement today  Nausea Abdominal fullness Poor oral intake - Secondary to pancreatic mass and large renal mass - Has been on scheduled Reglan  and is having minimal benefit - History of Roux-en-Y procedure, large pancreatic mass, renal mass, not a candidate for PEG tube placement. - Continue with p.o. as tolerated --J-tube placement today, will start tube feed tonight.   Malignant pancreatic mass - Presumed primary cause of all of the above - Patient previously seen [facility] oncology.  Prior biopsy revealed IPMN and patient was being monitored .  Appears she has now developed pancreatic cancer, but was still pending  biopsy via EUS at time of admission. - CT scan from 8/12 reviewed: Right renal mass 11 x 9 x 9 cm, abutting the adjacent duodenum.  Mass of pancreatic head 3 x 5 cm. - Patient has recently established with oncology.  She was planning for outpatient biopsy but unfortunately with her decline in appetite, worsening kidney function, and poor functional status she would not be a candidate for any cancer therapies at this time.  She is not a surgical candidate.  Please see oncology notes from 9/22 for more detail - Patient was initially considering discharge home with hospice but has now opted for J-tube placement with hopes that this will increase her functional status. - CA 19-9 has resulted and is very elevated. - Palliative care consulted - 9/19: Cholangiogram through biliary drain, bile duct stent placement with covered metallic stent. --pt asked for code status to be changed to Full Code on 9/24 --pt asked for code status to be changed back to DNR-limited today.   Stage III metastatic renal cell carcinoma - Very large renal mass causing compression and upward displacement. - Previously on immunotherapy - Oncology follow-up as above   Paroxysmal A-fib --resume eliquis  tonight   OSA - Continue CPAP at night   COPD, no acute exacerbation  Morbid obesity, BMI 43.92  Hx of DM, not currently active --d/c'ed BG checks and SSI    DVT prophylaxis: Lovenox  SQ Code Status: Full code  Family Communication: husband updated at bedside today Level of care: Telemetry Medical Dispo:   The patient is from: home Anticipated d/c is to: home Anticipated d/c date is: 1-2 days   Subjective and Interval History:  Pt received J tube placement today.  Reported pain afterwards.  Per Onc palliative care provider, pt changed her mind again today about her code status, and now is back to DNR-limited.   Objective: Vitals:   07/22/24 1115 07/22/24 1130 07/22/24 1146 07/22/24 1546  BP: 133/76 135/85  137/84 134/86  Pulse: 93 93 91 92  Resp: 20 (!) 21 16 17   Temp:   98.6 F (37 C) 97.6 F (36.4 C)  TempSrc:   Oral   SpO2: 99% 100% 99% 100%  Weight:      Height:        Intake/Output Summary (Last 24 hours) at 07/22/2024 1853 Last data filed at 07/22/2024 1800 Gross per 24 hour  Intake 2160.51 ml  Output 0 ml  Net 2160.51 ml   Filed Weights   07/13/24 1612 07/22/24 0720  Weight: 102 kg 102 kg    Examination:   Constitutional: NAD, AAOx3 HEENT: conjunctivae and lids normal, EOMI CV: No cyanosis.   RESP: normal respiratory effort, on RA Neuro: II - XII grossly intact.     Data Reviewed: I have personally reviewed labs and imaging studies  Time spent: 35 minutes  Ellouise Haber, MD [facility] If 7PM-7AM, please contact night-coverage 07/22/2024, 6:53 PM

## 2024-07-22 NOTE — Progress Notes (Signed)
 Palliative Medicine [facility] [phone] [phone]   Name: Alyssa Leonard Date: 07/22/2024 MRN: [id number]  DOB: 09-02-1956  Patient Care Team: [name] LABOR, MD as PCP - General (Internal Medicine) Darron Deatrice LABOR, MD as PCP - Cardiology (Cardiology) Luke Elsie GRADE, MD as PCP - Hematology/Oncology (Oncology) Kennyth Chew, MD as PCP - Electrophysiology (Cardiology) Melanee Annah BROCKS, MD as Consulting Physician (Oncology) Maurie Rayfield BIRCH, RN as Oncology Nurse Navigator    REASON FOR CONSULTATION: Alyssa Leonard is a 68 y.o. female with multiple medical problems including morbid obesity, OSA on CPAP, a flutter who could not afford Xarelto  and amiodarone , diabetes, and RCC with intra-abdominal metastasis previously on treatment with immunotherapy.  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  She was receiving treatment at [facility] but transferred care.  Patient was admitted to the hospital with AKI and presumed dehydration.  Palliative care consulted to address goals of manage ongoing symptoms.    CODE STATUS: DNR  PAST MEDICAL HISTORY: Past Medical History:  Diagnosis Date   Asthma    Atrial flutter (HCC)    COPD (chronic obstructive pulmonary disease) (HCC)    Diabetes mellitus without complication (HCC)    GERD (gastroesophageal reflux disease)    History of colon polyps    Hypertension    Meniere disease    OSA (obstructive sleep apnea)    Renal cell carcinoma (HCC)    Sleep apnea    Went for sleep study test in January 2018 I haven't heard back from test    PAST SURGICAL HISTORY:  Past Surgical History:  Procedure Laterality Date   ABDOMINAL HYSTERECTOMY     BARIATRIC SURGERY     CHOLECYSTECTOMY     COLONOSCOPY WITH PROPOFOL  N/A 12/11/2016   Procedure: COLONOSCOPY WITH PROPOFOL ;  Surgeon: Lamar ONEIDA Holmes, MD;  Location: [facility] ENDOSCOPY;  Service: Endoscopy;   Laterality: N/A;   COLONOSCOPY WITH PROPOFOL  N/A 11/27/2021   Procedure: COLONOSCOPY WITH PROPOFOL ;  Surgeon: Maryruth Ole ONEIDA, MD;  Location: [facility] ENDOSCOPY;  Service: Endoscopy;  Laterality: N/A;   IR BILIARY STENT(S) EXIST ACCESS INC DILAT CATH EXCH/REMOVAL  07/16/2024   IR CONVERT BILIARY DRAIN TO INT EXT BILIARY DRAIN  06/29/2024   IR EXCHANGE BILIARY DRAIN  06/22/2024   IR INT EXT BILIARY DRAIN WITH CHOLANGIOGRAM  06/09/2024   IR RADIOLOGIST EVAL & MGMT  06/15/2024    HEMATOLOGY/ONCOLOGY HISTORY:  Oncology History  Renal cell carcinoma (HCC)  03/26/2024 Initial Diagnosis   Renal cell carcinoma (HCC)   07/22/2024 Cancer Staging   Staging form: Kidney, AJCC 8th Edition - Clinical: Stage IV (cT4, cN0, cM0) - Signed by Melanee Annah BROCKS, MD on 07/22/2024     ALLERGIES:  is allergic to latex, aspirin, and plasticized base [plastibase].  MEDICATIONS:  Current Facility-Administered Medications  Medication Dose Route Frequency Provider Last Rate Last Admin   acetaminophen  (TYLENOL ) tablet 1,000 mg  1,000 mg Oral Q6H Awanda City, MD       albuterol  (PROVENTIL ) (2.5 MG/3ML) 0.083% nebulizer solution 2.5 mg  2.5 mg Nebulization Q6H PRN Pabon, Diego F, MD       amiodarone  (PACERONE ) tablet 200 mg  200 mg Oral Daily Pabon, Diego F, MD   200 mg at 07/21/24 9073   bisacodyl  (DULCOLAX) EC tablet 5 mg  5 mg Oral Daily PRN Pabon, Diego F, MD       calcium  carbonate (TUMS - dosed in mg  elemental calcium ) chewable tablet 200 mg of elemental calcium   1 tablet Oral TID Awanda City, MD       cefoTEtan  (CEFOTAN ) 2 g in sodium chloride  0.9 % 100 mL IVPB  2 g Intravenous Q12H Pabon, Diego F, MD       Chlorhexidine  Gluconate Cloth 2 % PADS 6 each  6 each Topical Q0600 Jordis Laneta FALCON, MD   6 each at 07/22/24 9364   diazepam  (VALIUM ) tablet 2 mg  2 mg Oral BID PRN Pabon, Laneta FALCON, MD       docusate sodium  (COLACE) capsule 100 mg  100 mg Oral BID Pabon, Diego F, MD   100 mg at 07/21/24 2228   famotidine  (PEPCID )  tablet 20 mg  20 mg Oral BID BM & HS PRN Pabon, Diego F, MD   20 mg at 07/21/24 1652   feeding supplement (ENSURE PLUS HIGH PROTEIN) liquid 237 mL  237 mL Oral BID BM Awanda City, MD       feeding supplement (OSMOLITE 1.5 CAL) liquid 1,000 mL  1,000 mL Per Tube Continuous Awanda City, MD       folic acid  (FOLVITE ) tablet 1 mg  1 mg Per Tube Daily Awanda City, MD       free water  30 mL  30 mL Per Tube Q4H Awanda City, MD       lactated ringers  infusion   Intravenous Continuous Pabon, [place] F, MD 100 mL/hr at 07/22/24 1239 New Bag at 07/22/24 1239   lactulose  (CHRONULAC ) 10 GM/15ML solution 10 g  10 g Oral TID Jordis Laneta F, MD   10 g at 07/20/24 2126   lidocaine  (XYLOCAINE ) 1 % (with pres) injection 10 mL  10 mL Intradermal Once Pabon, Diego F, MD       meclizine  (ANTIVERT ) tablet 25 mg  25 mg Oral TID PRN Pabon, Diego F, MD       metoCLOPramide  (REGLAN ) injection 5 mg  5 mg Intravenous Q8H Pabon, Diego F, MD   5 mg at 07/22/24 0635   metoprolol  tartrate (LOPRESSOR ) tablet 25 mg  25 mg Oral BID Jordis Laneta F, MD   25 mg at 07/21/24 2228   morphine  (PF) 2 MG/ML injection 2 mg  2 mg Intravenous Q2H PRN Pabon, Laneta F, MD   2 mg at 07/22/24 1233   multivitamin with minerals tablet 1 tablet  1 tablet Per Tube BID Clair Marolyn NOVAK, RPH       OLANZapine  (ZYPREXA ) tablet 10 mg  10 mg Oral QHS Pabon, [place] F, MD   10 mg at 07/21/24 2228   oxyCODONE  (Oxy IR/ROXICODONE ) immediate release tablet 5 mg  5 mg Oral Q8H PRN Pabon, Diego F, MD   5 mg at 07/21/24 1653   pantoprazole  (PROTONIX ) EC tablet 40 mg  40 mg Oral Daily Pabon, Diego F, MD   40 mg at 07/21/24 9073   senna-docusate (Senokot-S) tablet 1 tablet  1 tablet Oral QHS PRN Pabon, Diego F, MD       sodium chloride  flush (NS) 0.9 % injection 10-40 mL  10-40 mL Intracatheter Q12H Pabon, Diego F, MD   10 mL at 07/21/24 2235   sodium chloride  flush (NS) 0.9 % injection 10-40 mL  10-40 mL Intracatheter PRN Pabon, Diego F, MD       thiamine  (VITAMIN B1) tablet 100  mg  100 mg Per Tube Daily Awanda City, MD        VITAL SIGNS: BP 137/84 (BP Location: Left  Arm)   Pulse 91   Temp 98.6 F (37 C) (Oral)   Resp 16   Ht 5' (1.524 m)   Wt 224 lb 13.9 oz (102 kg)   SpO2 99%   BMI 43.92 kg/m  Filed Weights   07/13/24 1612 07/22/24 0720  Weight: 224 lb 13.9 oz (102 kg) 224 lb 13.9 oz (102 kg)    Estimated body mass index is 43.92 kg/m as calculated from the following:   Height as of this encounter: 5' (1.524 m).   Weight as of this encounter: 224 lb 13.9 oz (102 kg).  LABS: CBC:    Component Value Date/Time   WBC 10.0 07/20/2024 0238   HGB 8.7 (L) 07/20/2024 0238   HGB 12.2 08/15/2023 1147   HCT 26.7 (L) 07/20/2024 0238   PLT 160 07/20/2024 0238   PLT 181 08/15/2023 1147   MCV 85.3 07/20/2024 0238   NEUTROABS 7.3 07/20/2024 0238   LYMPHSABS 1.3 07/20/2024 0238   MONOABS 1.2 (H) 07/20/2024 0238   EOSABS 0.2 07/20/2024 0238   BASOSABS 0.0 07/20/2024 0238   Comprehensive Metabolic Panel:    Component Value Date/Time   NA 140 07/20/2024 0238   K 4.0 07/20/2024 0238   CL 112 (H) 07/20/2024 0238   CO2 21 (L) 07/20/2024 0238   BUN 44 (H) 07/20/2024 0238   CREATININE 2.33 (H) 07/22/2024 0216   CREATININE 1.01 (H) 08/15/2023 1147   GLUCOSE 101 (H) 07/20/2024 0238   CALCIUM  8.4 (L) 07/20/2024 0238   AST 17 07/20/2024 0238   AST 16 08/15/2023 1147   ALT 15 07/20/2024 0238   ALT 12 08/15/2023 1147   ALKPHOS 114 07/20/2024 0238   BILITOT 1.1 07/20/2024 0238   BILITOT 0.6 08/15/2023 1147   PROT 5.5 (L) 07/20/2024 0238   ALBUMIN 2.1 (L) 07/20/2024 0238    RADIOGRAPHIC STUDIES: IR BILIARY STENT(S) EXIST ACCESS INC DILAT CATH EXCH/REMOVAL Result Date: 07/16/2024 INDICATION: Common bile duct obstruction secondary to pancreatic mass and status post previous placement of internal/external biliary drain. Bilirubin has now normalized and the patient presents for possible internalization of biliary stenting via percutaneous approach. EXAM: BILIARY  STENT PLACEMENT VIA PRE-EXISTING INTERNAL/EXTERNAL DRAIN APPROACH INCLUDING CHOLANGIOGRAM MEDICATIONS: 2 g IV cefoxitin ; The antibiotic was administered within an appropriate time frame prior to the initiation of the procedure. ANESTHESIA/SEDATION: Moderate (conscious) sedation was employed during this procedure. A total of Versed  3.0 mg and Fentanyl  125 mcg was administered intravenously by the radiology nurse. Total intra-service moderate Sedation Time: 51 minutes. The patient's level of consciousness and vital signs were monitored continuously by radiology nursing throughout the procedure under my direct supervision. FLUOROSCOPY: Radiation Exposure Index (as provided by the fluoroscopic device): 469 mGy Kerma COMPLICATIONS: None immediate. PROCEDURE: Informed written consent was obtained from the patient after a thorough discussion of the procedural risks, benefits and alternatives. All questions were addressed. Maximal Sterile Barrier Technique was utilized including caps, mask, sterile gowns, sterile gloves, sterile drape, hand hygiene and skin antiseptic. A timeout was performed prior to the initiation of the procedure. Initial cholangiogram was performed through the pre-existing internal/external biliary drainage catheter. A radiopaque stent guided ruler had been placed along the patient's back in order to guide measurement assessment for stent placement. The drain was then cut and removed over a guidewire. A 7 French sheath was advanced over the wire to the level of the duodenum. Additional contrast and air was injected to outline the duodenal lumen. The sheath was retracted across the common bile  duct in order to define length of common bile duct stricture/obstruction. A 10 mm x 60 mm Wallflex covered biliary stent prosthesis was chosen for placement. The covered stent deployment system was advanced over a guidewire to the level of the duodenum and retracted into appropriate position. The stent was then  deployed across the common bile duct. After stent deployment, the stent lumen was additionally dilated with an 8 mm x 40 mm Athletis balloon. Balloon dilatation was performed in 2 different segments in order to cover the stented segment. The balloon was deflated and removed. Additional cholangiogram was then performed through the 7 French sheath positioned at the confluence of right and left intrahepatic bile ducts as well as in the right intrahepatic bile ducts. Percutaneous right-sided biliary access was then removed with removal of guidewire and sheath. A gauze dressing was applied over the percutaneous access site. 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 Intrahepatic bile ducts are relatively decompressed. Based on length of estimated stricture, a 10 mm x 60 mm covered self expanding biliary stent was chosen for placement. After stent deployment, due to persistent narrowing of the stent in its midportion, the stent was further post dilated to 8 mm resulting in improved patency. The stent is well positioned extending from just below the biliary confluence of right and left intrahepatic ducts to the level of the duodenum. Excellent antegrade flow is documented through the stent after placement which allowed complete removal of percutaneous access on completion. 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 The covered stent was additionally post dilated with an 8 mm balloon and demonstrates excellent patency extending from just below the biliary confluence to the duodenum with widely patent flow demonstrated after stent placement. Percutaneous biliary access was removed upon completion of the procedure. Electronically Signed   By: Marcey Moan M.D.    On: 07/16/2024 16:02   DG Abd 2 Views Result Date: 07/01/2024 CLINICAL DATA:  Constipation. EXAM: ABDOMEN - 2 VIEW COMPARISON:  CT 06/08/2024 FINDINGS: No bowel dilatation or evidence of obstruction. Air and small volume of stool throughout nondilated colon. Small volume of stool in the rectum. Multiple surgical clips in the left upper quadrant. A few surgical clips in the pelvis. Right upper quadrant biliary stent. No free intra-abdominal air. IMPRESSION: Small volume formed stool in the colon. No bowel obstruction. Electronically Signed   By: Andrea Gasman M.D.   On: 07/01/2024 00:03   IR CONVERT BILIARY DRAIN TO INT EXT BILIARY DRAIN Result Date: 06/29/2024 INDICATION: Biliary obstruction in a patient with an indwelling biliary drain previously placed 06/09/2024. On 06/22/2024, the patient returned for further evaluation to find that the catheter had partially retracted internally with a redundant loop of the 10 [place] biliary drain extra capsular to the liver. Despite multiple attempts to gain access across the common bile duct obstruction, the biliary drainage catheter would not track across this obstruction and a drain was left in the common bile duct. The patient returns today for further evaluation and potential new access or converting the catheter to a better positioned internal/external biliary drainage catheter EXAM: Drain exchange COMPARISON:  None Available. ANESTHESIA/SEDATION: Moderate (conscious) sedation was employed during this procedure. A total of Versed  3 mg and Fentanyl  200 mcg was administered intravenously by the radiology nurse. Total intra-service moderate  Sedation Time: 30 minutes. The patient's level of consciousness and vital signs were monitored continuously by radiology nursing throughout the procedure under my direct supervision. CONTRAST:  25 mL Omnipaque  300-administered into the collecting system(s) FLUOROSCOPY: Radiation Exposure Index (as provided by the fluoroscopic  device): 406 mGy Kerma COMPLICATIONS: None immediate. PROCEDURE: Informed written consent was obtained from the patient after a thorough discussion of the procedural risks, benefits and alternatives. All questions were addressed. Maximal Sterile Barrier Technique was utilized including caps, mask, sterile gowns, sterile gloves, sterile drape, hand hygiene and skin antiseptic. A timeout was performed prior to the initiation of the procedure. In a supine position, the right upper quadrant and external portion of the catheter were prepped and draped in usual sterile fashion. Dilute contrast was injected into the indwelling biliary drain filling the right lobe bile ducts as well as the common bile duct. Common bile duct is markedly dilated with moderate dilatation of the intrahepatic ducts. Multiple attempts were then made to advance a guidewire through the pigtail catheter and out through the ampulla into the duodenal without success. The pigtail catheter was cut and removed over a Bentson guidewire. An angled glide catheter was then advanced over the Bentson and attempts at reducing the redundant extra capsular loop were unsuccessful. The catheter was then directed toward the ampulla within the common bile duct and using a combination of Glidewire and Bentson guidewire finally gaining access into the duodenum. Once duodenal access was obtained, contrast was injected verifying intraluminal position within the bowel. The Bentson guidewire was then advanced into the jejunum until enough guidewire was demonstrated to be looping redundantly within the jejunum giving a long length of guidewire position within the small bowel. The glide catheter was then withdrawn. Slow gentle traction was then applied to the guidewire with minimal reduction in the redundant extra capsular loop. Rapid and somewhat forceful traction was then applied to the guidewire and the redundant loop was completely reduced. At this point a 6 French 25 cm  sheath was advanced over the guidewire into the common bile duct and directed toward the ampulla. The introducer was then removed and the angled glide catheter was advanced over the guidewire into the jejunum. The guidewire was then removed and exchanged for an Amplatz wire. The sheath and catheter were then completely removed leaving the guidewire in position. An 8 [place] by 40 cm skater biliary drain was advanced over the guidewire and coiled within the duodenal. Contrast was again injected demonstrating final position. Retention suture was applied. Sterile dressing applied. Catheter was connected to gravity drainage. IMPRESSION: Satisfactory reduction of the extra capsular loop and a advancement of the distal tip of the catheter into the bowel completing the internal/external biliary drain placement in a good position for full function. The catheter will remain connected to gravity drainage. The patient is being admitted for tachycardia, worsening renal function and likely underlying sepsis. IR will continue to monitor bilirubin and drain function. Plan for capping once bilirubin has normalized. The patient had previous difficulty with the internal external drain care and may need extra education and support. Short-term follow-up in the clinic after discharge would likely be beneficial. The patient will be scheduled for a 4 week return for drain change and evaluation as well. Electronically Signed   By: Cordella Banner   On: 06/29/2024 14:14   IR EXCHANGE BILIARY DRAIN Result Date: 06/22/2024 INDICATION: Status post percutaneous biliary drainage procedure with placement of 10 French internal/external biliary drainage catheter via right lobe  percutaneous biliary access on 06/09/2024. There is significant leakage of bile around the tube exit site currently. EXAM: EXCHANGE OF BILIARY DRAINAGE CATHETER UNDER FLUOROSCOPY INCLUDING CHOLANGIOGRAM MEDICATIONS: None ANESTHESIA/SEDATION: None FLUOROSCOPY: Radiation  Exposure Index (as provided by the fluoroscopic device): 541 mGy Kerma CONTRAST:  25 mL Omnipaque  300 COMPLICATIONS: None immediate. PROCEDURE: Informed written consent was obtained from the patient after a thorough discussion of the procedural risks, benefits and alternatives. All questions were addressed. Maximal Sterile Barrier Technique was utilized including caps, mask, sterile gowns, sterile gloves, sterile drape, hand hygiene and skin antiseptic. A timeout was performed prior to the initiation of the procedure. Initial fluoroscopy was performed of the indwelling biliary drainage catheter. The exiting drain was injected with contrast. The drainage catheter was then removed over a guidewire and a 5 French catheter advanced over the wire. Contrast was injected via the catheter to opacify the drain tract. The catheter was then advanced over a hydrophilic guidewire into right intrahepatic bile ducts followed by the common bile duct. The 5 French catheter was further advanced to the level of the duodenum and a guidewire advanced into the duodenum. A 10 French internal/external biliary drainage catheter was attempted to be advanced over various guidewires. Ultimately, a 10 [place] multipurpose drainage catheter was advanced over a guidewire and partially formed. The catheter was injected with contrast to confirm final positioning and attached to a gravity drainage bag. The catheter was secured at the skin exit site with a Prolene retention suture and StatLock device. FINDINGS: Initial fluoroscopy demonstrates severe retraction of the previously placed internal/external biliary drain nearly out of the liver with some drainage side holes located outside of the costal margin. Injection of contrast barely opacifies the liver. A 5 French catheter was placed and contrast injection did opacify a tract back into right intrahepatic bile ducts. There is a persistent redundant and tortuous tract peripheral to bile ducts outside  of the liver. 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 Despite access through to the level of the duodenum, a 10 [place] internal/external biliary drain could not be fully advanced into the duodenum due to buckling of guidewire and catheter outside of the liver which prevented the drain to cross the distal CBD stricture. Ultimately, a standard 10 [place] multipurpose drain was advanced with sideholes predominantly in right-sided intrahepatic ducts and the distal portion of the catheter extending just to the origin of the common bile duct. This drain is draining bile and will be left to gravity drainage. As this drain will not be adequate for long-term drainage, the patient will be brought back for either re-attempted conversion to an internal/external biliary drain or new biliary drain placement via new percutaneous biliary access under moderate IV conscious sedation. IMPRESSION: 1. Previously placed internal/external biliary drainage catheter had retracted nearly out of the liver and bile ducts. 2. Tract back into right intrahepatic bile ducts and across the CBD was not able to be recanalized by a 5 French catheter to the level of the duodenum. 3. A 10 French internal/external biliary drainage catheter could not be advanced into the duodenum due to buckling of guidewire and catheter outside of the liver, per venting the drain to cross the CBD. 4. A standard 10 [place] multipurpose drain was advanced into right-sided intrahepatic ducts and just to the level of the origin of the common bile duct.  This will be left to external gravity drainage. 5. The patient will be brought back for re-attempted conversion versus new percutaneous biliary drainage catheter placement under moderate IV conscious sedation. Electronically Signed   By: Marcey Moan M.D.   On: 06/22/2024 15:54     PERFORMANCE STATUS (ECOG) : 4 - Bedbound  Review of Systems Unless otherwise noted, a complete review of systems is negative.  Physical Exam General: NAD Cardiovascular: regular rate and rhythm Pulmonary: clear ant fields Abdomen: soft, nontender, + bowel sounds GU: no suprapubic tenderness Extremities: no edema, no joint deformities Skin: no rashes Neurological: Weakness but otherwise nonfocal  IMPRESSION: Follow-up visit.  Patient accompanied by her husband.  Patient status post surgical J-tube earlier today.  Met with patient and husband to discuss goals.  Performance status is still poor and although serum creatinine slowly improving, renal function remains problematic.  He is factors are likely to limit patient's ability to receive chemotherapy.  We discussed her wishes and if she would want to pursue biopsy.  However, patient says that if she is not a candidate for treatment, she would be okay with foregoing biopsy and instead pursuing hospice care at home.  We discussed what hospice would entail and that they would focus on keeping her comfortable and supporting her at home until end-of-life.  Both patient and husband verbalized agreement.  Of note, patient apparently reversed her CODE STATUS yesterday.  Today, she shows me her 5 wishes document which we reviewed in detail.  We also had an extensive conversation about resuscitation and end-of-life care.  At this time, patient states clearly and repeatedly that she would NOT want CPR, defibrillation, or intubation.  She says she would prefer a natural death and does NOT want broken bones or other futile measures.  Her husband verbalized agreement with DNR/DNI.   PLAN: - Recommend best supportive care - DNR/DNI - Referral for hospice services at home - Will follow  Case and plan discussed with Dr. Melanee and Dr. Awanda   Time Total: 30 minutes  Visit consisted of counseling and education dealing with the complex and  emotionally intense issues of symptom management and palliative care in the setting of serious and potentially life-threatening illness.Greater than 50%  of this time was spent counseling and coordinating care related to the above assessment and plan.  Signed by: Fonda Mower, PhD, NP-C

## 2024-07-22 NOTE — Op Note (Signed)
 Robotic assisted laparoscopic jejunostomy tube placement # 18 FR   Pre-operative Diagnosis: malnutrition   Post-operative Diagnosis: same   Procedure:  Robotic assisted laparoscopic jejunostomy tube placement # 18 FR   Surgeon: Laneta Luna, MD FACS   Anesthesia: Gen. with endotracheal tube   Findings: No evidence of carcinomatosis  Extensive adhesions from prior surgeries, small bowel and omentum to the abdominal wall no bowel injuries J tube within the proximal, intraluminally and well positioned Very difficult case due to labor intensive enterolysis and patient body habitus   Estimated Blood Loss:10 cc             Complications: none     Procedure Details  The patient was seen again in the Holding Room. The benefits, complications, treatment options, and expected outcomes were discussed with the Family. The risks of bleeding, infection, recurrence of symptoms, failure to resolve symptoms, bowel injury, any of which could require further surgery were reviewed .   The  family concurred with the proposed plan, giving informed consent.  The patient was taken to Operating Room, identified  and the procedure verified. A Time Out was held and the above information confirmed.   Prior to the induction of general anesthesia, antibiotic prophylaxis was administered. VTE prophylaxis was in place. General endotracheal anesthesia was then administered and tolerated well. After the induction, the abdomen was prepped with Chloraprep and draped in the sterile fashion. The patient was positioned in the supine position.   Palmer's point selected to insert veres needle. Appropriate insufflation pressures seen as well as saline drop test ; pneumoperitoneum was established. Left LQ incision created and using Optiview technique 8 mm trochar placed. No evidence of any injuries observed. Three 8-mm ports were placed under direct vision.  There were significant adhesion that were extensive and required  about 45 minutes of additional operative time, I did enterolysis with laparoscopic scissors prior to docking the robot, this maneuver allowed me to restore the anatomy and proceed in a safe fashion, No bowel injuries identified.    The patient was positioned  in reverse Trendelenburg, robot was brought to the surgical field and docked in the standard fashion.  We made sure all the instrumentation was kept indirect view at all times and that there were no collision between the arms. I scrubbed out and went to the console. The T colon was elevated and we identified treitz and proximal jejunum. Using the scissors I performed a jejunotomy and confirmed that I was intraluminally. I had already placed a 18 FR tube w balloon within the LUQ. A partial pursestring suture was placed using 3 0 V-Loc suture around the J-tube in an inner fashion and the catheter was placed inside the lumen of the bowel. I was able to get my scrub to flush saline via the J-tube confirming the proper position of the tube intraluminally.  The balloon was inflated w 3 cc I did a witzel reinforcement to prevent any leaks and then pexed the bowel to the abdominal wall. NO kinks observed No bleeding, injury or leak, or bowel injury was noted. All the needles were removed under direct visualization. Robotic instruments and robotic arms were undocked in the standard fashion.  I scrubbed back in. Marcaine  was used to infiltrate the abdominal wall at all incision sites The catheter secure to the abdominal wall with two 3-0 nylon on each side of the catheter defect on the abdominal wall.   Pneumoperitoneum was released.  Bumper was placed against abdominal wall. 4-0  subcuticular Monocryl was used to close the skin. Dermabond was  applied.  The patient was then extubated and brought to the recovery room in stable condition. Sponge, lap, and needle counts were correct at closure and at the conclusion of the case.               Laneta Luna,  MD, FACS

## 2024-07-22 NOTE — Progress Notes (Addendum)
 [facility] Liaison Note  Received a referral from [facility], Antion Johnson, LCSW, for hospice services at home.  Patient's home tube feedings would not be covered by the Hospice Medicare Benefit.  As a result, patient and spouse decided that they would not go home with hospice follow up.  They would prefer to go home with regular home health so there would be some insurance coverage for home tube feedings. TOC and hospital team aware.  Patient would also like to have a hospital bed, over bed table, WC, Shower chair and BSC delivered to the home.   [facility] will provide palliative care follow up at discharge.    Please call with any Hospice or palliative care questions or concerns  Thank you for the opportunity to participate in this patient's care.  [facility] Liaison [phone]

## 2024-07-22 NOTE — Anesthesia Preprocedure Evaluation (Addendum)
 Anesthesia Evaluation  Patient identified by MRN, date of birth, ID band Patient awake    Reviewed: Allergy & Precautions, H&P , NPO status , Patient's Chart, lab work & pertinent test results  Airway Mallampati: IV  TM Distance: >3 FB Neck ROM: full    Dental  (+) Poor Dentition,    Pulmonary shortness of breath and with exertion, sleep apnea , COPD, former smoker   Pulmonary exam normal        Cardiovascular hypertension, + dysrhythmias Atrial Fibrillation  Rhythm:Regular Rate:Normal + Peripheral Edema    Neuro/Psych negative neurological ROS  negative psych ROS   GI/Hepatic ,GERD  ,,(+) Cirrhosis        History of Roux-en-Y procedure, large pancreatic mass, renal mass, not a candidate for PEG tube placement.   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  Patient reports that she has had poor p.o. intake since cancer diagnosis.  She also endorses recurrent nausea.  She has recently met with oncology and palliative care and is planning for biopsy of the pancreatic mass.  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  Expanding metallic biliary stent was placed with improved biliary patency, external biliary stent was removed.  Cirrhosis and further complicated by ongoing AKI, and goals of care discussions.   Ultimately patient has opted to proceed with surgical J-tube placement.   Past Medical History: No date: Asthma No date: Atrial  flutter (HCC) No date: COPD (chronic obstructive pulmonary disease) (HCC) No date: Diabetes mellitus without complication (HCC) No date: GERD (gastroesophageal reflux disease) No date: History of colon polyps No date: Hypertension No date: Meniere disease No date: OSA (obstructive sleep apnea) No date: Renal cell carcinoma (HCC) No date: Sleep apnea     Comment:  Went for sleep study test in January 2018 I haven't               heard back from test  Past Surgical History: No date: ABDOMINAL HYSTERECTOMY No date: BARIATRIC SURGERY No date: CHOLECYSTECTOMY 12/11/2016: COLONOSCOPY WITH PROPOFOL ; N/A     Comment:  Procedure: COLONOSCOPY WITH PROPOFOL ;  Surgeon: Lamar ONEIDA Holmes, MD;  Location: [facility] ENDOSCOPY;  Service:               Endoscopy;  Laterality: N/A; 11/27/2021: COLONOSCOPY WITH PROPOFOL ; N/A     Comment:  Procedure: COLONOSCOPY WITH PROPOFOL ;  Surgeon:               Maryruth Ole ONEIDA, MD;  Location: [facility] ENDOSCOPY;                Service: Endoscopy;  Laterality: N/A; 07/16/2024: IR BILIARY STENT(S) EXIST ACCESS INC DILAT CATH EXCH/ REMOVAL 06/29/2024: IR CONVERT BILIARY DRAIN TO INT EXT BILIARY DRAIN 06/22/2024: IR EXCHANGE BILIARY DRAIN 06/09/2024: IR INT EXT BILIARY DRAIN WITH CHOLANGIOGRAM 06/15/2024: IR RADIOLOGIST EVAL & MGMT  BMI    Body Mass Index: 43.92 kg/m      Reproductive/Obstetrics negative OB  ROS                              Anesthesia Physical Anesthesia Plan  ASA: 4  Anesthesia Plan: General ETT   Post-op Pain Management: Ofirmev  IV (intra-op)*   Induction: Intravenous  PONV Risk Score and Plan: 2 and Ondansetron  and Dexamethasone   Airway Management Planned: Oral ETT  Additional Equipment:   Intra-op Plan:   Post-operative Plan: Extubation in OR  Informed Consent: I have reviewed the patients History and Physical, chart, labs and discussed the procedure including the risks, benefits and  alternatives for the proposed anesthesia with the patient or authorized representative who has indicated his/her understanding and acceptance.     Dental Advisory Given  Plan Discussed with: CRNA and Surgeon  Anesthesia Plan Comments:          Anesthesia Quick Evaluation

## 2024-07-22 NOTE — Transfer of Care (Signed)
 Immediate Anesthesia Transfer of Care Note  Patient: Alyssa Leonard  Procedure(s) Performed: INSERTION, JEJUNOSTOMY TUBE, ROBOT-ASSISTED  Patient Location: PACU  Anesthesia Type:General  Level of Consciousness: awake and drowsy  Airway & Oxygen Therapy: Patient Spontanous Breathing and Patient connected to face mask oxygen  Post-op Assessment: Report given to RN and Post -op Vital signs reviewed and stable  Post vital signs: Reviewed and stable  Last Vitals:  Vitals Value Taken Time  BP 121/43 07/22/24 10:30  Temp    Pulse 100 07/22/24 10:33  Resp 22 07/22/24 10:33  SpO2 100 % 07/22/24 10:33  Vitals shown include unfiled device data.  Last Pain:  Vitals:   07/22/24 0720  TempSrc: Temporal  PainSc: 0-No pain      Patients Stated Pain Goal: 2 (07/13/24 1611)  Complications: No notable events documented.

## 2024-07-22 NOTE — Progress Notes (Signed)
 [facility]  ROUNDING NOTE   Subjective:   Patient seen laying in bed Remains somnolent post procedure  Creatinine 2.33  Objective:  Vital signs in last 24 hours:  Temp:  [96.8 F (36 C)-98.6 F (37 C)] 98.6 F (37 C) (09/25 1146) Pulse Rate:  [80-103] 91 (09/25 1146) Resp:  [16-25] 16 (09/25 1146) BP: (105-139)/(42-92) 137/84 (09/25 1146) SpO2:  [98 %-100 %] 99 % (09/25 1146) Weight:  [102 kg] 102 kg (09/25 0720)  Weight change:  Filed Weights   07/13/24 1612 07/22/24 0720  Weight: 102 kg 102 kg    Intake/Output: I/O last 3 completed shifts: In: 2743.8 [P.O.:300; I.V.:2443.8] Out: -    Intake/Output this shift:  Total I/O In: 900 [I.V.:900] Out: 0   Physical Exam: General: NAD  Head: Normocephalic, atraumatic. Moist oral mucosal membranes  Eyes: Anicteric  Lungs:  Clear to auscultation, normal effort  Heart: Regular rate and rhythm  Abdomen:  Soft, nontender  Extremities: No peripheral edema.  Neurologic: Somnolent   Skin: Warm,dry, no rash       Basic Metabolic Panel: Recent Labs  Lab 07/16/24 0543 07/17/24 0319 07/18/24 0442 07/19/24 0416 07/20/24 0238 07/21/24 0435 07/22/24 0216  NA 140 143 144 138 140  --   --   K 4.1 4.1 4.3 4.1 4.0  --   --   CL 112* 112* 115* 111 112*  --   --   CO2 19* 22 21* 20* 21*  --   --   GLUCOSE 117* 109* 116* 105* 101*  --   --   BUN 36* 33* 35* 43* 44*  --   --   CREATININE 2.31* 2.44* 2.60* 2.72* 2.61* 2.44* 2.33*  CALCIUM  8.7* 8.7* 8.7* 8.3* 8.4*  --   --   MG 2.0 2.0 1.8 1.8 1.8  --   --   PHOS 2.8 3.3 2.9 2.7 2.7  --   --     Liver Function Tests: Recent Labs  Lab 07/16/24 0543 07/17/24 0319 07/18/24 0442 07/19/24 0416 07/20/24 0238  AST 39 28 25 17 17   ALT 31 26 21 17 15   ALKPHOS 127* 126 121 100 114  BILITOT 1.0 0.9 0.9 1.0 1.1  PROT 6.0* 6.0* 5.5* 5.4* 5.5*  ALBUMIN 2.6* 2.5* 2.2* 2.1* 2.1*   No results for input(s): LIPASE, AMYLASE in the last 168 hours. No results for  input(s): AMMONIA in the last 168 hours.  CBC: Recent Labs  Lab 07/16/24 0543 07/17/24 0319 07/18/24 0442 07/19/24 0416 07/20/24 0238  WBC 6.6 6.9 16.4* 12.9* 10.0  NEUTROABS 3.9 4.5 13.3* 9.9* 7.3  HGB 9.6* 10.0* 10.0* 8.8* 8.7*  HCT 30.3* 30.7* 30.0* 26.3* 26.7*  MCV 86.6 85.0 84.0 84.6 85.3  PLT 186 181 171 153 160    Cardiac Enzymes: No results for input(s): CKTOTAL, CKMB, CKMBINDEX, TROPONINI in the last 168 hours.  BNP: Invalid input(s): POCBNP  CBG: Recent Labs  Lab 07/21/24 1145 07/21/24 1647 07/21/24 2129 07/22/24 0716 07/22/24 1036  GLUCAP 128* 70 73 103* 121*    Microbiology: Results for orders placed or performed during the hospital encounter of 06/29/24  Culture, blood (x 2)     Status: None   Collection Time: 06/29/24  4:39 PM   Specimen: BLOOD  Result Value Ref Range Status   Specimen Description BLOOD LEFT ANTECUBITAL  Final   Special Requests   Final    BOTTLES DRAWN AEROBIC AND ANAEROBIC Blood Culture adequate volume   Culture  Final    NO GROWTH 5 DAYS Performed at [facility], [address] [place], [place] [address]    Report Status 07/04/2024 FINAL  Final  Culture, blood (Routine X 2) w Reflex to ID Panel     Status: None   Collection Time: 06/29/24  7:02 PM   Specimen: BLOOD  Result Value Ref Range Status   Specimen Description BLOOD BLOOD LEFT HAND  Final   Special Requests   Final    BOTTLES DRAWN AEROBIC ONLY Blood Culture results may not be optimal due to an inadequate volume of blood received in culture bottles   Culture   Final    NO GROWTH 5 DAYS Performed at [facility], [address], [place], [place] [address]    Report Status 07/04/2024 FINAL  Final    Coagulation Studies: No results for input(s): LABPROT, INR in the last 72 hours.  Urinalysis: No results for input(s): COLORURINE, LABSPEC, PHURINE, GLUCOSEU, HGBUR, BILIRUBINUR, KETONESUR, PROTEINUR,  UROBILINOGEN, NITRITE, LEUKOCYTESUR in the last 72 hours.  Invalid input(s): APPERANCEUR    Imaging: No results found.   Medications:    cefoTEtan  (CEFOTAN ) IV     lactated ringers  100 mL/hr at 07/21/24 2348    acetaminophen   1,000 mg Per Tube Q6H   amiodarone   200 mg Oral Daily   Chlorhexidine  Gluconate Cloth  6 each Topical Q0600   docusate sodium   100 mg Oral BID   feeding supplement  237 mL Oral TID BM   feeding supplement (GLUCERNA SHAKE)  237 mL Oral TID BM   lactulose   10 g Oral TID   lidocaine   10 mL Intradermal Once   metoCLOPramide  (REGLAN ) injection  5 mg Intravenous Q8H   metoprolol  tartrate  25 mg Oral BID   OLANZapine   10 mg Oral QHS   pantoprazole   40 mg Oral Daily   sodium chloride  flush  10-40 mL Intracatheter Q12H   albuterol , bisacodyl , diazepam , famotidine , meclizine , morphine  injection, oxyCODONE , senna-docusate, sodium chloride  flush  Assessment/ Plan:  Alyssa Leonard is a 68 y.o.  female with past medical history of hypertension, GERD, OSA, obesity, pAfib, renal cell carcinoma, and malignant neoplasm of the pancreas, who was admitted to [facility] on 07/13/2024 for Dehydration [E86.0] Lack of appetite [R63.0] AKI (acute kidney injury) [N17.9]   Acute kidney injury on chronic kidney disease stage IIIb. Acute kidney injury appears prerenal due to persistent poor oral intake.Baseline creatinine 1.33 with GFR 44. Due to history of renal cell carcinoma and presence of pancreatic mass, palliative care consult would be appropriate to consider goals of care  Creatinine  has shown some improvements today. Will continue to monitor with feeds. No acute indication for dialysis.   Lab Results  Component Value Date   CREATININE 2.33 (H) 07/22/2024   CREATININE 2.44 (H) 07/21/2024   CREATININE 2.61 (H) 07/20/2024    Intake/Output Summary (Last 24 hours) at 07/22/2024 1200 Last data filed at 07/22/2024 1115 Gross per 24 hour  Intake 2425.86 ml  Output 0 ml   Net 2425.86 ml   2. Acute metabolic acidosis, S bicarb 20 on admission. Likely secondary to kidney injury. Should correct with kidney function.    3. Anemia of chronic kidney disease Lab Results  Component Value Date   HGB 8.7 (L) 07/20/2024   Avoiding ESA's due to cancer history.  4. Hypertension with chronic kidney disease. Prescribed Metoprolol  only.  Blood pressure stable   LOS: 6 Alyssa Leonard 9/25/202512:00 PM

## 2024-07-22 NOTE — TOC Initial Note (Signed)
 Transition of Care [facility]) - Initial/Assessment Note    Patient Details  Name: Alyssa Leonard MRN: [id number] Date of Birth: Jul 26, 1956  [facility] CM/SW Contact:    Alvaro Louder, LCSW Phone Number: 07/22/2024, 3:12 PM  Clinical Narrative:       Per Chart review patient from Home PCP is Ethridge Matsu. Patient and family indicated that they want Hospice at home. LCSWA to follow-up with family about Hospice agency           TOC to follow for discharge       Patient Goals and CMS Choice            Expected Discharge Plan and Services                                              Prior Living Arrangements/Services                       Activities of Daily Living   ADL Screening (condition at time of admission) Independently performs ADLs?: No Does the patient have a NEW difficulty with bathing/dressing/toileting/self-feeding that is expected to last >3 days?: No Does the patient have a NEW difficulty with getting in/out of bed, walking, or climbing stairs that is expected to last >3 days?: No Does the patient have a NEW difficulty with communication that is expected to last >3 days?: No Is the patient deaf or have difficulty hearing?: No Does the patient have difficulty seeing, even when wearing glasses/contacts?: No Does the patient have difficulty concentrating, remembering, or making decisions?: No  Permission Sought/Granted                  Emotional Assessment              Admission diagnosis:  Dehydration [E86.0] Lack of appetite [R63.0] AKI (acute kidney injury) [N17.9] Patient Active Problem List   Diagnosis Date Noted   Dehydration 07/20/2024   Palliative care encounter 07/15/2024   Neoplasm related pain 07/13/2024   AKI (acute kidney injury) 07/13/2024   Sepsis (HCC) 06/29/2024   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) 06/09/2024   Mass of head of pancreas 06/09/2024   Paroxysmal atrial  flutter (HCC) 03/27/2024   Left foot pain 03/26/2024   Hypokalemia 03/26/2024   Morbid obesity with BMI of 50.0-59.9, adult (HCC) 03/26/2024   OSA (obstructive sleep apnea) 03/26/2024   Asthma, chronic 03/26/2024   Cellulitis of left foot 03/26/2024   Renal cell carcinoma (HCC)    Hypertension    GERD (gastroesophageal reflux disease)    Diabetes mellitus without complication (HCC)    COPD (chronic obstructive pulmonary disease) (HCC)    PCP:  Matsu Ethridge LABOR, MD Pharmacy:   [facility] [phone] - [place], [place] - [address] [place] [address] Phone: [phone] Fax: [phone]  [id number] - [place], [place] - [address] [place] [address] Phone: [phone] Fax: [phone]  [facility] [address] [place] [address] Phone: [phone] Fax: [phone]     Social Drivers of Health (SDOH) Social History: SDOH Screenings   Food Insecurity: No Food Insecurity (07/14/2024)  Housing: Low Risk  (07/14/2024)  Recent Concern: Housing - High Risk (06/29/2024)  Transportation Needs: No Transportation Needs (07/14/2024)  Utilities: Not At Risk (07/14/2024)  Depression (PHQ2-9): Low Risk  (07/13/2024)  Financial Resource Strain: Low Risk  (06/01/2024)   Received from [facility]  Social Connections: Socially Integrated (07/14/2024)  Tobacco Use: Medium Risk (07/22/2024)  Health Literacy: Medium Risk (09/03/2023)   Received from [facility]   SDOH Interventions:     Readmission Risk Interventions     No data to display

## 2024-07-23 ENCOUNTER — Encounter: Payer: Self-pay | Admitting: Surgery

## 2024-07-23 ENCOUNTER — Inpatient Hospital Stay

## 2024-07-23 DIAGNOSIS — N179 Acute kidney failure, unspecified: Secondary | ICD-10-CM | POA: Diagnosis not present

## 2024-07-23 LAB — MAGNESIUM: Magnesium: 1.9 mg/dL (ref 1.7–2.4)

## 2024-07-23 LAB — GLUCOSE, CAPILLARY
Glucose-Capillary: 116 mg/dL — ABNORMAL HIGH (ref 70–99)
Glucose-Capillary: 125 mg/dL — ABNORMAL HIGH (ref 70–99)
Glucose-Capillary: 125 mg/dL — ABNORMAL HIGH (ref 70–99)
Glucose-Capillary: 145 mg/dL — ABNORMAL HIGH (ref 70–99)
Glucose-Capillary: 157 mg/dL — ABNORMAL HIGH (ref 70–99)
Glucose-Capillary: 157 mg/dL — ABNORMAL HIGH (ref 70–99)
Glucose-Capillary: 98 mg/dL (ref 70–99)

## 2024-07-23 LAB — COPPER, SERUM: Copper: 73 ug/dL — ABNORMAL LOW (ref 80–158)

## 2024-07-23 LAB — ZINC: Zinc: 65 ug/dL (ref 44–115)

## 2024-07-23 LAB — CREATININE, SERUM
Creatinine, Ser: 2.39 mg/dL — ABNORMAL HIGH (ref 0.44–1.00)
GFR, Estimated: 22 mL/min — ABNORMAL LOW (ref >60)

## 2024-07-23 LAB — PHOSPHORUS: Phosphorus: 3.2 mg/dL (ref 2.5–4.6)

## 2024-07-23 MED ORDER — PROSOURCE TF20 ENFIT COMPATIBL EN LIQD
60.0000 mL | Freq: Every day | ENTERAL | Status: DC
  Administered 2024-07-24: 60 mL
  Filled 2024-07-23: qty 60

## 2024-07-23 MED ORDER — OSMOLITE 1.5 CAL PO LIQD: 1000.0000 mL | ORAL | Status: DC

## 2024-07-23 MED ORDER — OSMOLITE 1.5 CAL PO LIQD
1000.0000 mL | ORAL | Status: DC
  Administered 2024-07-23: 1000 mL

## 2024-07-23 NOTE — Progress Notes (Addendum)
 Nutrition Follow-up  DOCUMENTATION CODES:   Morbid obesity  INTERVENTION:   -Continue Glucerna Shake po TID, each supplement provides 220 kcal and 10 grams of protein  -Continue MVI with minerals BID -Continue 500 mg calcium  carbonate TID -Continue regular diet -TF via j-tube:    Osmolite 1.5 @ 20 ml/hr (trickles per MD)   Once able, increase by 10 ml every 12 hours to goal rate of 50 ml/hr.    60 ml Prosource TF20 daily   30 ml free water  flush every 4 hours   Tube feeding regimen provides 1880 kcal (100% of needs), 95 grams of protein, and 914 ml of H2O. Total free water : 1094 ml daily    -Continue 100 mg thiamine  daily x 7 days -Monitor Mg, K, and Phos and replete as needed secondary to high refeeding risk -Pt with history of gastric sleeve with poor oral intake and significant weight loss. RD will draw labs to rule out potential micronutrient deficiencies: iron, folic acid , thiamine , copper , zinc , vitamin B-12, vitamin D , vitamin A, vitamin E, vitamin A, and calcium  -When transitioning to home, recommend transition to nocturnal feedings. Pt is not a candidate for bolus feedings secondary to j-tube:   Osmolite 1.5 @ 80 ml/hr x 16 hours   30 ml free water  flush every 4 hours   Tube feeding regimen provides 1920 kcals, 80 grams of protein, and 975 ml of H2O.  Total free water : 1155 ml free water  daily    NUTRITION DIAGNOSIS:   Increased nutrient needs related to chronic illness (pancreatic mass) as evidenced by estimated needs.  Ongoing  GOAL:   Patient will meet greater than or equal to 90% of their needs  Progressing   MONITOR:   PO intake, Supplement acceptance, TF tolerance  REASON FOR ASSESSMENT:   Consult Enteral/tube feeding initiation and management  ASSESSMENT:   Pt with history of renal cell carcinoma, obstructive jaundice secondary to malignant neoplasm, mass in the pancreas, paroxysmal atrial fibrillation on Eliquis , hypertension, obstructive  sleep apnea, GERD, morbid obesity, who presents for chief concerns of acute kidney injury.  Reviewed I/O's: +2.2 L x 24 hours and +11.3 L since admission  UOP: 0 ml x 24 hours  Emesis: 300 ml x 24 hours  Visited pt earlier this morning, she was sleeping soundly and did not arouse to voice. No family present. Noted TF off; per RN TF were held secondary to emesis last night. Per RN, pt felt queasy last night prior to TF starting, which she attributed to drinking Glucerna.   Pt underwent x-ray, which confirmed j-tube in good position. Case discussed with RN, MD, and general surgery; plan to resume TF at trickle rate today.   RD re-visited pt later this AM, however, out of room and no family present.   Discussed discharge needs with RN, MD, and TOC.   Medications reviewed and include lactulose , lactulose , and thiamine .   Labs reviewed: K, Mg, and Phos WDL. CBGS: 103-159 (inpatient orders for glycemic control are none).  Fe: 21. Corrected calcium : 9.6, Vitamin D  and vitamin B-12 WDL.   Diet Order:   Diet Order             Diet regular Room service appropriate? Yes; Fluid consistency: Thin  Diet effective now                   EDUCATION NEEDS:   Not appropriate for education at this time  Skin:  Skin Assessment: Skin Integrity Issues: Skin Integrity Issues::  Incisions Incisions: closed abdomen  Last BM:  07/22/24  Height:   Ht Readings from Last 1 Encounters:  07/22/24 5' (1.524 m)    Weight:   Wt Readings from Last 1 Encounters:  07/23/24 116.7 kg    Ideal Body Weight:  45.5 kg  BMI:  Body mass index is 50.25 kg/m.  Estimated Nutritional Needs:   Kcal:  1800-2000  Protein:  90-105 grams  Fluid:  1.8-2.0 L    Margery ORN, RD, LDN, CDCES Registered Dietitian III Certified Diabetes Care and Education Specialist If unable to reach this RD, please use RD Inpatient group chat on secure chat between hours of 8am-4 pm daily

## 2024-07-23 NOTE — Progress Notes (Signed)
 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  Patient reports that she has had poor p.o. intake since cancer diagnosis.  She also endorses recurrent nausea.  She has recently met with oncology and palliative care and is planning for biopsy of the pancreatic mass.  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  Expanding metallic biliary stent was placed with improved biliary patency, external biliary stent was removed.  Cirrhosis and further complicated by ongoing AKI, and goals of care discussions.   Ultimately patient has opted to proceed with surgical J-tube placement.  AKI on CKD 3B - Multifactorial.  Prerenal secondary to poor p.o. intake, complicated by massive renal mass - Baseline creatinine 0.76  20month ago. - Nephrology consulted  - s/p IV fluids - Not a candidate for renal replacement therapy --hydration via J-tube  Nausea Abdominal fullness Poor oral intake - Secondary to pancreatic mass and large renal mass - Has been on scheduled Reglan  and is having minimal benefit - History of Roux-en-Y procedure, large pancreatic mass, renal mass, not a candidate for PEG tube placement. - Continue with p.o. as tolerated --start trickle tube feed via J-tube   Malignant pancreatic mass - Presumed primary cause of all of the above - Patient previously seen [facility] oncology.  Prior biopsy revealed IPMN and patient was being monitored .  Appears she has now developed pancreatic cancer, but was still pending biopsy via EUS at time  of admission. - CT scan from 8/12 reviewed: Right renal mass 11 x 9 x 9 cm, abutting the adjacent duodenum.  Mass of pancreatic head 3 x 5 cm. - Patient has recently established with oncology.  She was planning for outpatient biopsy but unfortunately with her decline in appetite, worsening kidney function, and poor functional status she would not be a candidate for any cancer therapies at this time.  She is not a surgical candidate.  Please see oncology notes from 9/22 for more detail - Patient was initially considering discharge home with hospice but has now opted for J-tube placement with hopes that this will increase her functional status. - CA 19-9 has resulted and is very elevated. - Palliative care consulted - 9/19: Cholangiogram through biliary drain, bile duct stent placement with covered metallic stent. --pt asked for code status to be changed to Full Code on 9/24 --pt asked for code status to be changed back to DNR-limited on 9/25.   Stage III metastatic renal cell carcinoma - Very large renal mass causing compression and upward displacement. - Previously on immunotherapy - Oncology follow-up as above   Paroxysmal A-fib --cont Elquis   OSA - Continue CPAP at night   COPD, no acute exacerbation  Morbid obesity, BMI 43.92  Hx of DM, not currently active --d/c'ed BG checks and SSI    DVT prophylaxis: Lovenox  SQ Code Status: Full code  Family Communication:  Level of care: Telemetry Medical Dispo:   The patient is from: home Anticipated d/c is to: home Anticipated d/c date is: Sunday   Subjective and Interval History:  Pt reported abdominal pain improved, however,  had had large-volume vomiting since last night.  GenSurg cleared to resume trickle tube feed.   Objective: Vitals:   07/23/24 0419 07/23/24 0500 07/23/24 0739 07/23/24 1537  BP: 130/77  (!) 137/91 (!) 145/86  Pulse: 91  88 81  Resp: 17  16 16   Temp: 97.8 F (36.6 C)  97.9 F (36.6 C) (!) 97.5 F  (36.4 C)  TempSrc:   Oral Oral  SpO2: 96%  100% 100%  Weight:  116.7 kg    Height:        Intake/Output Summary (Last 24 hours) at 07/23/2024 1852 Last data filed at 07/23/2024 1630 Gross per 24 hour  Intake 1168.15 ml  Output 700 ml  Net 468.15 ml   Filed Weights   07/13/24 1612 07/22/24 0720 07/23/24 0500  Weight: 102 kg 102 kg 116.7 kg    Examination:   Constitutional: NAD, AAOx3 HEENT: conjunctivae and lids normal, EOMI CV: No cyanosis.   RESP: normal respiratory effort, on RA Neuro: II - XII grossly intact.   Psych: Normal mood and affect.     Data Reviewed: I have personally reviewed labs and imaging studies  Time spent: 35 minutes  Ellouise Haber, MD [facility] If 7PM-7AM, please contact night-coverage 07/23/2024, 6:52 PM

## 2024-07-23 NOTE — Progress Notes (Addendum)
 [facility] SURGICAL PROGRESS NOTE  Hospital Day(s): 7.   Post op day(s): 1 Day Post-Op.   Interval History:  Patient seen and examined No acute events or new complaints overnight.  Patient reports she did have emesis late yesterday and early this AM She does report vertigo with laying flat as well which triggers emesis No fever, chills Abdomen is sore expectedly.  Tube feeds held given emesis KUB without gross evidence of ileus or obstruction, air in colon  Vital signs in last 24 hours: [min-max] current  Temp:  [97.6 F (36.4 C)-98.1 F (36.7 C)] 97.9 F (36.6 C) (09/26 0739) Pulse Rate:  [88-96] 88 (09/26 0739) Resp:  [16-19] 16 (09/26 0739) BP: (130-156)/(77-96) 137/91 (09/26 0739) SpO2:  [96 %-100 %] 100 % (09/26 0739) Weight:  [116.7 kg] 116.7 kg (09/26 0500)     Height: 5' (152.4 cm) Weight: 116.7 kg BMI (Calculated): 50.25   Intake/Output last 2 shifts:  09/25 0701 - 09/26 0700 In: 2482.9 [P.O.:100; I.V.:1933.9; NG/GT:319; IV Piggyback:100] Out: 300 [Emesis/NG output:300]   Physical Exam:  Constitutional: alert, cooperative and no distress  Respiratory: breathing non-labored at rest  Cardiovascular: regular rate and sinus rhythm  Gastrointestinal: obese, soft, non-tender, non-distended. Jejunostomy in the left abdomen; capped Integumentary: Laparoscopic incisions are CDI with dermabond, no erythema or drainage   Labs:     Latest Ref Rng & Units 07/20/2024    2:38 AM 07/19/2024    4:16 AM 07/18/2024    4:42 AM  CBC  WBC 4.0 - 10.5 K/uL 10.0  12.9  16.4   Hemoglobin 12.0 - 15.0 g/dL 8.7  8.8  89.9   Hematocrit 36.0 - 46.0 % 26.7  26.3  30.0   Platelets 150 - 400 K/uL 160  153  171       Latest Ref Rng & Units 07/23/2024    3:41 AM 07/22/2024    1:43 PM 07/22/2024    2:16 AM  CMP  Creatinine 0.44 - 1.00 mg/dL 7.60   7.66   Calcium  8.9 - 10.3 mg/dL  8.1       Imaging studies: No new pertinent imaging studies   Assessment/Plan:  67 y.o.  female 1 Day Post-Op s/p robotic assisted laparoscopic feeding jejunostomy tube for severe malnutrition secondary to advanced renal cell carcinoma.   - I do not think this emesis is secondary to her tube feeds. She has a large RUQ mass which may be partially obstructing duodenum or gastric outlet, which is why gastrostomy tube was deferred. This may be secondary to her vertigo. No evidence of ileus or obstruction on KUB.   - I do think we can resume tube feeds at trickle rate and advance slowly. This jejunostomy tube is very distal to the cancer and I doubt this is going that far retrograde   - Monitor abdominal examination   - Further management per primary service; we will be available as needed.    All of the above findings and recommendations were discussed with the patient, and the medical team, and all of patient's questions were answered to her expressed satisfaction.  -- Arthea Platt, PA-C [facility] 07/23/2024, 11:58 AM M-F: 7am - 4pm

## 2024-07-23 NOTE — Progress Notes (Signed)
 [facility]  ROUNDING NOTE   Subjective:   Patient seen resting in bed Alert  Tolerating tube feeds Denies pain  Creatinine 2.39  Objective:  Vital signs in last 24 hours:  Temp:  [97.6 F (36.4 C)-98.1 F (36.7 C)] 97.9 F (36.6 C) (09/26 0739) Pulse Rate:  [88-96] 88 (09/26 0739) Resp:  [16-19] 16 (09/26 0739) BP: (130-156)/(77-96) 137/91 (09/26 0739) SpO2:  [96 %-100 %] 100 % (09/26 0739) Weight:  [116.7 kg] 116.7 kg (09/26 0500)  Weight change:  Filed Weights   07/13/24 1612 07/22/24 0720 07/23/24 0500  Weight: 102 kg 102 kg 116.7 kg    Intake/Output: I/O last 3 completed shifts: In: 3082.4 [P.O.:280; I.V.:2353.4; Other:30; NG/GT:319; IV Piggyback:100] Out: 300 [Emesis/NG output:300]   Intake/Output this shift:  Total I/O In: -  Out: 400 [Emesis/NG output:400]  Physical Exam: General: NAD  Head: Normocephalic, atraumatic. Moist oral mucosal membranes  Eyes: Anicteric  Lungs:  Clear to auscultation, normal effort  Heart: Regular rate and rhythm  Abdomen:  Soft, nontender  Extremities: No peripheral edema.  Neurologic: Awake and alert  Skin: Warm,dry, no rash       Basic Metabolic Panel: Recent Labs  Lab 07/17/24 0319 07/18/24 0442 07/19/24 0416 07/20/24 0238 07/21/24 0435 07/22/24 0216 07/22/24 1343 07/23/24 0341  NA 143 144 138 140  --   --   --   --   K 4.1 4.3 4.1 4.0  --   --   --   --   CL 112* 115* 111 112*  --   --   --   --   CO2 22 21* 20* 21*  --   --   --   --   GLUCOSE 109* 116* 105* 101*  --   --   --   --   BUN 33* 35* 43* 44*  --   --   --   --   CREATININE 2.44* 2.60* 2.72* 2.61* 2.44* 2.33*  --  2.39*  CALCIUM  8.7* 8.7* 8.3* 8.4*  --   --  8.1*  --   MG 2.0 1.8 1.8 1.8  --   --  1.7 1.9  PHOS 3.3 2.9 2.7 2.7  --   --  3.3 3.2    Liver Function Tests: Recent Labs  Lab 07/17/24 0319 07/18/24 0442 07/19/24 0416 07/20/24 0238  AST 28 25 17 17   ALT 26 21 17 15   ALKPHOS 126 121 100 114  BILITOT 0.9 0.9  1.0 1.1  PROT 6.0* 5.5* 5.4* 5.5*  ALBUMIN 2.5* 2.2* 2.1* 2.1*   No results for input(s): LIPASE, AMYLASE in the last 168 hours. No results for input(s): AMMONIA in the last 168 hours.  CBC: Recent Labs  Lab 07/17/24 0319 07/18/24 0442 07/19/24 0416 07/20/24 0238  WBC 6.9 16.4* 12.9* 10.0  NEUTROABS 4.5 13.3* 9.9* 7.3  HGB 10.0* 10.0* 8.8* 8.7*  HCT 30.7* 30.0* 26.3* 26.7*  MCV 85.0 84.0 84.6 85.3  PLT 181 171 153 160    Cardiac Enzymes: No results for input(s): CKTOTAL, CKMB, CKMBINDEX, TROPONINI in the last 168 hours.  BNP: Invalid input(s): POCBNP  CBG: Recent Labs  Lab 07/22/24 1549 07/22/24 2119 07/23/24 0011 07/23/24 0421 07/23/24 0736  GLUCAP 159* 159* 157* 157* 145*    Microbiology: Results for orders placed or performed during the hospital encounter of 06/29/24  Culture, blood (x 2)     Status: None   Collection Time: 06/29/24  4:39 PM   Specimen: BLOOD  Result Value Ref Range Status   Specimen Description BLOOD LEFT ANTECUBITAL  Final   Special Requests   Final    BOTTLES DRAWN AEROBIC AND ANAEROBIC Blood Culture adequate volume   Culture   Final    NO GROWTH 5 DAYS Performed at [facility], [address] [place], [place] [address]    Report Status 07/04/2024 FINAL  Final  Culture, blood (Routine X 2) w Reflex to ID Panel     Status: None   Collection Time: 06/29/24  7:02 PM   Specimen: BLOOD  Result Value Ref Range Status   Specimen Description BLOOD BLOOD LEFT HAND  Final   Special Requests   Final    BOTTLES DRAWN AEROBIC ONLY Blood Culture results may not be optimal due to an inadequate volume of blood received in culture bottles   Culture   Final    NO GROWTH 5 DAYS Performed at [facility], [address], [place], [place] [address]    Report Status 07/04/2024 FINAL  Final    Coagulation Studies: No results for input(s): LABPROT, INR in the last 72 hours.  Urinalysis: No results for  input(s): COLORURINE, LABSPEC, PHURINE, GLUCOSEU, HGBUR, BILIRUBINUR, KETONESUR, PROTEINUR, UROBILINOGEN, NITRITE, LEUKOCYTESUR in the last 72 hours.  Invalid input(s): APPERANCEUR    Imaging: No results found.   Medications:    cefoTEtan  (CEFOTAN ) IV 2 g (07/23/24 1148)   feeding supplement (OSMOLITE 1.5 CAL) Stopped (07/23/24 0600)    acetaminophen   1,000 mg Oral Q6H   amiodarone   200 mg Oral Daily   apixaban   5 mg Oral BID   calcium  carbonate  1 tablet Oral TID   Chlorhexidine  Gluconate Cloth  6 each Topical Q0600   docusate sodium   100 mg Oral BID   feeding supplement (GLUCERNA SHAKE)  237 mL Oral TID BM   folic acid   1 mg Per Tube Daily   free water   30 mL Per Tube Q4H   lactulose   10 g Oral TID   lidocaine   10 mL Intradermal Once   metoCLOPramide  (REGLAN ) injection  5 mg Intravenous Q8H   metoprolol  tartrate  25 mg Oral BID   multivitamin with minerals  1 tablet Per Tube BID   OLANZapine   10 mg Oral QHS   pantoprazole   40 mg Oral Daily   sodium chloride  flush  10-40 mL Intracatheter Q12H   thiamine   100 mg Per Tube Daily   albuterol , bisacodyl , diazepam , famotidine , meclizine , morphine  injection, oxyCODONE , senna-docusate, sodium chloride  flush  Assessment/ Plan:  Ms. YAMILEX BORGWARDT is a 68 y.o.  female with past medical history of hypertension, GERD, OSA, obesity, pAfib, renal cell carcinoma, and malignant neoplasm of the pancreas, who was admitted to [facility] on 07/13/2024 for Dehydration [E86.0] Lack of appetite [R63.0] AKI (acute kidney injury) [N17.9]   Acute kidney injury on chronic kidney disease stage IIIb. Acute kidney injury appears prerenal due to persistent poor oral intake.Baseline creatinine 1.33 with GFR 44. Due to history of renal cell carcinoma and presence of pancreatic mass, palliative care consult would be appropriate to consider goals of care  Creatinine remains stable. Will continue to monitor renal recovery with enteral feeds.    Lab Results  Component Value Date   CREATININE 2.39 (H) 07/23/2024   CREATININE 2.33 (H) 07/22/2024   CREATININE 2.44 (H) 07/21/2024    Intake/Output Summary (Last 24 hours) at 07/23/2024 1158 Last data filed at 07/23/2024 1110 Gross per 24 hour  Intake 1582.85 ml  Output  700 ml  Net 882.85 ml   2. Acute metabolic acidosis, S bicarb 20 on admission. Likely secondary to kidney injury.Will assess with am labs  3. Anemia of chronic kidney disease Lab Results  Component Value Date   HGB 8.7 (L) 07/20/2024   Avoiding ESA's due to cancer history. Hgb stable  4. Hypertension with chronic kidney disease. Prescribed Metoprolol  only.  Blood pressure acceptable for this patient    LOS: 7 Detrell Umscheid 9/26/202511:58 AM

## 2024-07-23 NOTE — TOC Progression Note (Signed)
 Transition of Care [facility]) - Progression Note    Patient Details  Name: Alyssa Leonard MRN: [id number] Date of Birth: 1956/02/25  [facility] of [facility] CM/SW Contact  Marinda Cooks, RN Phone Number: 07/23/2024, 4:08 PM  Clinical Narrative:    This CM spoke with pt and her husband introduced role and discussed dc plan. Pt and husband confirmed dc plan to include HH with DME. This CM provided choice for agency preference for pt's [facility] and DME at which was updated pt did not have a preference. This CM coordinated HH with after speaking with [place] via [facility] and coordinated Tube feeding supplies and machine with [employer] after speaking with Pam. This CM updated pt is no medically cleared to dc today. TOC will cont to follow dc planning / Care coordination and update as applicable.    via [employer] being delivered to pt's home.                      Expected Discharge Plan and Services                                               Social Drivers of Health (SDOH) Interventions SDOH Screenings   Food Insecurity: No Food Insecurity (07/14/2024)  Housing: Low Risk  (07/14/2024)  Recent Concern: Housing - High Risk (06/29/2024)  Transportation Needs: No Transportation Needs (07/14/2024)  Utilities: Not At Risk (07/14/2024)  Depression (PHQ2-9): Low Risk  (07/13/2024)  Financial Resource Strain: Low Risk  (06/01/2024)   Received from [facility]  Social Connections: Socially Integrated (07/14/2024)  Tobacco Use: Medium Risk (07/22/2024)  Health Literacy: Medium Risk (09/03/2023)   Received from [facility]    Readmission Risk Interventions     No data to display

## 2024-07-23 NOTE — Progress Notes (Signed)
 [id number] patient vomited green vomitus aprrox . Night coverage informed. Tube feeding on hold for now. Will pass to day shift.

## 2024-07-23 NOTE — TOC CM/SW Note (Cosign Needed)
 The pt is confined to a single rm & there is no toilet on that level of home will need BSC . They will also need a shower chair for mobility support while in shower .   Pt suffers from leg fx which impairs their ability to perform ADL'S & toileting. A cane will not resolve the issue with performing ADL'S,. A w/c will allow pt pt to safely propel the w/c in the home or allow caregiver to provide assistance . Length needed is 6 months.    The Pt Requires frequent repositioning of the body and the head to be 30 degrees due to (MD Insert Diagnosis )This Is  not achievable by an ordinary bed, they will need a hospital bed.

## 2024-07-23 NOTE — Plan of Care (Signed)
  Problem: Metabolic: Goal: Ability to maintain appropriate glucose levels will improve Outcome: Progressing   Problem: Clinical Measurements: Goal: Diagnostic test results will improve Outcome: Progressing   Problem: Activity: Goal: Risk for activity intolerance will decrease Outcome: Progressing   Problem: Coping: Goal: Level of anxiety will decrease Outcome: Progressing   Problem: Pain Managment: Goal: General experience of comfort will improve and/or be controlled Outcome: Progressing

## 2024-07-24 DIAGNOSIS — N179 Acute kidney failure, unspecified: Secondary | ICD-10-CM | POA: Diagnosis not present

## 2024-07-24 LAB — VITAMIN K1, SERUM: VITAMIN K1: <0.1 ng/mL — ABNORMAL LOW (ref 0.10–2.20)

## 2024-07-24 LAB — MAGNESIUM: Magnesium: 1.9 mg/dL (ref 1.7–2.4)

## 2024-07-24 LAB — PHOSPHORUS: Phosphorus: 2.9 mg/dL (ref 2.5–4.6)

## 2024-07-24 LAB — GLUCOSE, CAPILLARY
Glucose-Capillary: 137 mg/dL — ABNORMAL HIGH (ref 70–99)
Glucose-Capillary: 150 mg/dL — ABNORMAL HIGH (ref 70–99)
Glucose-Capillary: 113 mg/dL — ABNORMAL HIGH (ref 70–99)
Glucose-Capillary: 76 mg/dL (ref 70–99)
Glucose-Capillary: 72 mg/dL (ref 70–99)

## 2024-07-24 MED ORDER — SODIUM CHLORIDE 0.9 % IV SOLN: INTRAVENOUS | Status: DC

## 2024-07-24 MED ORDER — ONDANSETRON HCL 4 MG/2ML IJ SOLN
4.0000 mg | Freq: Four times a day (QID) | INTRAMUSCULAR | Status: DC | PRN
  Administered 2024-07-24 – 2024-07-25 (×2): 4 mg via INTRAVENOUS
  Filled 2024-07-24 (×2): qty 2

## 2024-07-24 MED ORDER — DEXTROSE-SODIUM CHLORIDE 5-0.9 % IV SOLN: INTRAVENOUS | Status: DC

## 2024-07-24 NOTE — Plan of Care (Signed)
  Problem: Education: Goal: Ability to describe self-care measures that may prevent or decrease complications (Diabetes Survival Skills Education) will improve Outcome: Progressing   Problem: Metabolic: Goal: Ability to maintain appropriate glucose levels will improve Outcome: Progressing   Problem: Nutritional: Goal: Maintenance of adequate nutrition will improve Outcome: Progressing   Problem: Activity: Goal: Risk for activity intolerance will decrease Outcome: Progressing   Problem: Coping: Goal: Level of anxiety will decrease Outcome: Progressing

## 2024-07-24 NOTE — Progress Notes (Signed)
 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  Patient reports that she has had poor p.o. intake since cancer diagnosis.  She also endorses recurrent nausea.  She has recently met with oncology and palliative care and is planning for biopsy of the pancreatic mass.  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  Expanding metallic biliary stent was placed with improved biliary patency, external biliary stent was removed.  Cirrhosis and further complicated by ongoing AKI, and goals of care discussions.   Ultimately patient has opted to proceed with surgical J-tube placement.  AKI on CKD 3B - Multifactorial.  Prerenal secondary to poor p.o. intake, complicated by massive renal mass - Baseline creatinine 0.76  72month ago. - Nephrology consulted  - s/p IV fluids - Not a candidate for renal replacement therapy --hydration via J-tube  Nausea Abdominal fullness Poor oral intake - Secondary to pancreatic mass and large renal mass - Has been on scheduled Reglan  and is having minimal benefit - History of Roux-en-Y procedure, large pancreatic mass, renal mass, not a candidate for PEG tube placement. - Continue with p.o. as tolerated --hold tube feed 2/2 vomiting   Malignant pancreatic mass - Presumed primary cause of all of the above - Patient previously seen [facility] oncology.  Prior biopsy revealed IPMN and patient was being monitored .  Appears she has now developed pancreatic cancer, but was still pending biopsy via EUS at time of  admission. - CT scan from 8/12 reviewed: Right renal mass 11 x 9 x 9 cm, abutting the adjacent duodenum.  Mass of pancreatic head 3 x 5 cm. - Patient has recently established with oncology.  She was planning for outpatient biopsy but unfortunately with her decline in appetite, worsening kidney function, and poor functional status she would not be a candidate for any cancer therapies at this time.  She is not a surgical candidate.  Please see oncology notes from 9/22 for more detail - Patient was initially considering discharge home with hospice but has now opted for J-tube placement with hopes that this will increase her functional status. - CA 19-9 has resulted and is very elevated. - Palliative care consulted - 9/19: Cholangiogram through biliary drain, bile duct stent placement with covered metallic stent. --pt asked for code status to be changed to Full Code on 9/24 --pt asked for code status to be changed back to DNR-limited on 9/25.   Stage III metastatic renal cell carcinoma - Very large renal mass causing compression and upward displacement. - Previously on immunotherapy - Oncology follow-up as above   Paroxysmal A-fib --cont Eliquis    OSA - Continue CPAP at night   COPD, no acute exacerbation  Morbid obesity, BMI 43.92  Hx of DM, not currently active --d/c'ed BG checks and SSI    DVT prophylaxis: Lovenox  SQ Code Status: Full code  Family Communication:  Level of care: Telemetry Medical Dispo:   The patient is from: home Anticipated d/c is to: home Anticipated d/c date is: 1-2 days   Subjective and Interval History:  Tube feed resumed yesterday, but had  vomiting x1 overnight, and 300 ml vomit today.  Pt also reported having mushy stool overnight.   Objective: Vitals:   07/24/24 0500 07/24/24 0524 07/24/24 0725 07/24/24 1523  BP:  (!) 139/91 137/88 135/86  Pulse:  77 79 76  Resp:  18 17 16   Temp:  97.6 F (36.4 C) (!) 97 F (36.1 C) 98.1 F (36.7 C)   TempSrc:  Oral    SpO2:  100% 100% 100%  Weight: 113.5 kg     Height:        Intake/Output Summary (Last 24 hours) at 07/24/2024 1844 Last data filed at 07/24/2024 1330 Gross per 24 hour  Intake 1199.17 ml  Output 450 ml  Net 749.17 ml   Filed Weights   07/22/24 0720 07/23/24 0500 07/24/24 0500  Weight: 102 kg 116.7 kg 113.5 kg    Examination:   Constitutional: NAD, AAOx3 HEENT: conjunctivae and lids normal, EOMI CV: No cyanosis.   RESP: normal respiratory effort, on RA Neuro: II - XII grossly intact.   Psych: Normal mood and affect.  Appropriate judgement and reason   Data Reviewed: I have personally reviewed labs and imaging studies  Time spent: 35 minutes  Ellouise Haber, MD [facility] If 7PM-7AM, please contact night-coverage 07/24/2024, 6:44 PM

## 2024-07-24 NOTE — Progress Notes (Signed)
 [facility]  PROGRESS NOTE   Subjective:   Patient seen at bedside.  Denies any chest pain or shortness of breath.  Objective:  Vital signs: Blood pressure 137/88, pulse 79, temperature (!) 97 F (36.1 C), resp. rate 17, height 5' (1.524 m), weight 113.5 kg, SpO2 100%.  Intake/Output Summary (Last 24 hours) at 07/24/2024 1357 Last data filed at 07/24/2024 1250 Gross per 24 hour  Intake 839.33 ml  Output 450 ml  Net 389.33 ml   Filed Weights   07/22/24 0720 07/23/24 0500 07/24/24 0500  Weight: 102 kg 116.7 kg 113.5 kg     Physical Exam: General:  No acute distress  Head:  Normocephalic, atraumatic. Moist oral mucosal membranes  Eyes:  Anicteric  Neck:  Supple  Lungs:   Clear to auscultation, normal effort  Heart:  S1S2 no rubs  Abdomen:   Soft, nontender, bowel sounds present  Extremities:  peripheral edema.  Neurologic:  Awake, alert, following commands  Skin:  No lesions  Access:     Basic Metabolic Panel: Recent Labs  Lab 07/18/24 0442 07/19/24 0416 07/20/24 0238 07/21/24 0435 07/22/24 0216 07/22/24 1343 07/23/24 0341 07/24/24 0300  NA 144 138 140  --   --   --   --   --   K 4.3 4.1 4.0  --   --   --   --   --   CL 115* 111 112*  --   --   --   --   --   CO2 21* 20* 21*  --   --   --   --   --   GLUCOSE 116* 105* 101*  --   --   --   --   --   BUN 35* 43* 44*  --   --   --   --   --   CREATININE 2.60* 2.72* 2.61* 2.44* 2.33*  --  2.39*  --   CALCIUM  8.7* 8.3* 8.4*  --   --  8.1*  --   --   MG 1.8 1.8 1.8  --   --  1.7 1.9 1.9  PHOS 2.9 2.7 2.7  --   --  3.3 3.2 2.9   GFR: Estimated Creatinine Clearance: 25.9 mL/min (A) (by C-G formula based on SCr of 2.39 mg/dL (H)).  Liver Function Tests: Recent Labs  Lab 07/18/24 0442 07/19/24 0416 07/20/24 0238  AST 25 17 17   ALT 21 17 15   ALKPHOS 121 100 114  BILITOT 0.9 1.0 1.1  PROT 5.5* 5.4* 5.5*  ALBUMIN 2.2* 2.1* 2.1*   No results for input(s): LIPASE, AMYLASE in the last 168  hours. No results for input(s): AMMONIA in the last 168 hours.  CBC: Recent Labs  Lab 07/18/24 0442 07/19/24 0416 07/20/24 0238  WBC 16.4* 12.9* 10.0  NEUTROABS 13.3* 9.9* 7.3  HGB 10.0* 8.8* 8.7*  HCT 30.0* 26.3* 26.7*  MCV 84.0 84.6 85.3  PLT 171 153 160     HbA1C: Hgb A1c MFr Bld  Date/Time Value Ref Range Status  06/09/2024 03:03 AM 5.5 4.8 - 5.6 % Final    Comment:    (NOTE)         Prediabetes: 5.7 - 6.4         Diabetes: >6.4         Glycemic control for adults with diabetes: <7.0     Urinalysis: No results for input(s): COLORURINE, LABSPEC, PHURINE, GLUCOSEU, HGBUR, BILIRUBINUR, KETONESUR, PROTEINUR, UROBILINOGEN, NITRITE, LEUKOCYTESUR  in the last 72 hours.  Invalid input(s): APPERANCEUR    Imaging: DG ABD ACUTE 2+V W 1V CHEST Result Date: 07/23/2024 CLINICAL DATA:  Ileus following gastrointestinal surgery. EXAM: DG ABDOMEN ACUTE WITH 1 VIEW CHEST COMPARISON:  Chest radiographs 03/30/2024. Abdominal radiographs 06/30/2024. Abdominopelvic CT 06/08/2024. FINDINGS: Low lung volumes. The heart size is stable at the upper limits of normal. The mediastinal contours are normal. The lungs appear clear. No pleural effusion or pneumothorax. A metallic biliary stent has been placed without obvious pneumobilia. There is a normal, nonobstructive bowel gas pattern. Multiple surgical clips are present in the left upper quadrant of the abdomen and in the pelvis. No evidence of pneumoperitoneum or acute osseous abnormality. Lumbar facet arthropathy noted. IMPRESSION: No evidence of acute cardiopulmonary or abdominal process. Interval biliary stent placement without obvious pneumobilia. Electronically Signed   By: Elsie Perone M.D.   On: 07/23/2024 15:10     Medications:    feeding supplement (OSMOLITE 1.5 CAL) Stopped (07/24/24 1330)    acetaminophen   1,000 mg Oral Q6H   amiodarone   200 mg Oral Daily   apixaban   5 mg Oral BID   calcium  carbonate  1  tablet Oral TID   Chlorhexidine  Gluconate Cloth  6 each Topical Q0600   docusate sodium   100 mg Oral BID   feeding supplement (GLUCERNA SHAKE)  237 mL Oral TID BM   feeding supplement (PROSource TF20)  60 mL Per Tube Daily   folic acid   1 mg Per Tube Daily   free water   30 mL Per Tube Q4H   lactulose   10 g Oral TID   lidocaine   10 mL Intradermal Once   metoCLOPramide  (REGLAN ) injection  5 mg Intravenous Q8H   metoprolol  tartrate  25 mg Oral BID   multivitamin with minerals  1 tablet Per Tube BID   OLANZapine   10 mg Oral QHS   pantoprazole   40 mg Oral Daily   sodium chloride  flush  10-40 mL Intracatheter Q12H   thiamine   100 mg Per Tube Daily    Assessment/ Plan:      68 y.o.  female with past medical history of hypertension, GERD, OSA, obesity, pAfib, renal cell carcinoma, and malignant neoplasm of the pancreas, who was admitted to [facility] on 07/13/2024 for Dehydration [E86.0] Lack of appetite and AKI (acute kidney injury)  #1: Acute kidney injury: Renal disease has been stable.  Most likely prerenal causes.  #2: Metastatic renal cell carcinoma: Pancreatic mass: Palliative care is most appropriate.  Patient started on tube feeds.  #3: Obstructive sleep apnea: Patient has been on CPAP.  Continue supportive care. Labs and medications reviewed. Will continue to follow along with you.   LOS: 8 Pinkey Edman, MD [facility] 9/27/20251:57 PM

## 2024-07-25 DIAGNOSIS — N179 Acute kidney failure, unspecified: Secondary | ICD-10-CM | POA: Diagnosis not present

## 2024-07-25 LAB — BASIC METABOLIC PANEL WITH GFR
Anion gap: 11 (ref 5–15)
BUN: 45 mg/dL — ABNORMAL HIGH (ref 8–23)
CO2: 23 mmol/L (ref 22–32)
Calcium: 8.5 mg/dL — ABNORMAL LOW (ref 8.9–10.3)
Chloride: 116 mmol/L — ABNORMAL HIGH (ref 98–111)
Creatinine, Ser: 2.32 mg/dL — ABNORMAL HIGH (ref 0.44–1.00)
GFR, Estimated: 22 mL/min — ABNORMAL LOW (ref >60)
Glucose, Bld: 110 mg/dL — ABNORMAL HIGH (ref 70–99)
Potassium: 3.6 mmol/L (ref 3.5–5.1)
Sodium: 150 mmol/L — ABNORMAL HIGH (ref 135–145)

## 2024-07-25 LAB — GLUCOSE, CAPILLARY
Glucose-Capillary: 100 mg/dL — ABNORMAL HIGH (ref 70–99)
Glucose-Capillary: 103 mg/dL — ABNORMAL HIGH (ref 70–99)
Glucose-Capillary: 103 mg/dL — ABNORMAL HIGH (ref 70–99)
Glucose-Capillary: 75 mg/dL (ref 70–99)
Glucose-Capillary: 97 mg/dL (ref 70–99)
Glucose-Capillary: 99 mg/dL (ref 70–99)

## 2024-07-25 LAB — PHOSPHORUS: Phosphorus: 2.4 mg/dL — ABNORMAL LOW (ref 2.5–4.6)

## 2024-07-25 LAB — MAGNESIUM: Magnesium: 2.4 mg/dL (ref 1.7–2.4)

## 2024-07-25 MED ORDER — POTASSIUM PHOSPHATES 15 MMOLE/5ML IV SOLN
15.0000 mmol | Freq: Once | INTRAVENOUS | Status: AC
  Administered 2024-07-25: 15 mmol via INTRAVENOUS
  Filled 2024-07-25: qty 5

## 2024-07-25 MED ORDER — DEXTROSE 5 % IV SOLN: INTRAVENOUS | Status: AC

## 2024-07-25 NOTE — TOC Progression Note (Signed)
 Transition of Care [facility]) - Progression Note    Patient Details  Name: Alyssa Leonard MRN: [id number] Date of Birth: 29-Jan-1956  Transition of Care [facility] CM/SW Contact  Marinda Cooks, RN Phone Number: 07/25/2024, 2:40 PM  Clinical Narrative:    Per chart review pt not medically cleared to dc today. TOC will cont to follow dc planning / care coordination and update as applicable.                    Expected Discharge Plan and Services                                               Social Drivers of Health (SDOH) Interventions SDOH Screenings   Food Insecurity: No Food Insecurity (07/14/2024)  Housing: Low Risk  (07/14/2024)  Recent Concern: Housing - High Risk (06/29/2024)  Transportation Needs: No Transportation Needs (07/14/2024)  Utilities: Not At Risk (07/14/2024)  Depression (PHQ2-9): Low Risk  (07/13/2024)  Financial Resource Strain: Low Risk  (06/01/2024)   Received from [facility]  Social Connections: Socially Integrated (07/14/2024)  Tobacco Use: Medium Risk (07/22/2024)  Health Literacy: Medium Risk (09/03/2023)   Received from [facility]    Readmission Risk Interventions     No data to display

## 2024-07-25 NOTE — Progress Notes (Signed)
 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  Patient reports that she has had poor p.o. intake since cancer diagnosis.  She also endorses recurrent nausea.  She has recently met with oncology and palliative care and is planning for biopsy of the pancreatic mass.  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  Expanding metallic biliary stent was placed with improved biliary patency, external biliary stent was removed.  Cirrhosis and further complicated by ongoing AKI, and goals of care discussions.   Ultimately patient has opted to proceed with surgical J-tube placement.  AKI on CKD 3B - Multifactorial.  Prerenal secondary to poor p.o. intake, complicated by massive renal mass - Baseline creatinine 0.76  97month ago. - Nephrology consulted  - s/p IV fluids - Not a candidate for renal replacement therapy --cont MIVF  Nausea Abdominal fullness Poor oral intake - Secondary to pancreatic mass and large renal mass - Has been on scheduled Reglan  and is having minimal benefit - History of Roux-en-Y procedure, large pancreatic mass, renal mass, not a candidate for PEG tube placement. --J tube placed on 07/22/24 - Continue with p.o. as tolerated  Vomiting --large-volume vomiting since tube feeding started.  Discussed with GenSurg today, will wait till Monday for evaluation of the J-tube and small bowel motility with a contrast study  --hold tube feeding for now  Hyponatremia --likely due to free water  deficit --cont MIVF as  D5   Malignant pancreatic mass - Presumed primary cause of all of the above - Patient previously seen [facility] oncology.  Prior biopsy revealed IPMN and patient was being monitored .  Appears she has now developed pancreatic cancer, but was still pending biopsy via EUS at time of admission. - CT scan from 8/12 reviewed: Right renal mass 11 x 9 x 9 cm, abutting the adjacent duodenum.  Mass of pancreatic head 3 x 5 cm. - Patient has recently established with oncology.  She was planning for outpatient biopsy but unfortunately with her decline in appetite, worsening kidney function, and poor functional status she would not be a candidate for any cancer therapies at this time.  She is not a surgical candidate.  Please see oncology notes from 9/22 for more detail - Patient was initially considering discharge home with hospice but has now opted for J-tube placement with hopes that this will increase her functional status. - CA 19-9 has resulted and is very elevated. - Palliative care consulted - 9/19: Cholangiogram through biliary drain, bile duct stent placement with covered metallic stent. --pt asked for code status to be changed to Full Code on 9/24 --pt asked for code status to be changed back to DNR-limited on 9/25.   Stage III metastatic renal cell carcinoma - Very large renal mass causing compression and upward displacement. - Previously on immunotherapy - Oncology follow-up as above   Paroxysmal A-fib --cont Eliquis    OSA - Continue CPAP at night   COPD, no acute exacerbation  Morbid obesity, BMI 43.92  Hx of DM, not currently active --d/c'ed BG checks and SSI    DVT prophylaxis:  Lovenox  SQ Code Status: Full code  Family Communication: husband updated at bedside today Level of care: Telemetry Medical Dispo:   The patient is from: home Anticipated d/c is to: home Anticipated d/c date is: 1-2 days   Subjective and Interval History:  No reported large-volume vomit since  yesterday.  Pt reported abdominal pain and diarrhea last night.   Objective: Vitals:   07/24/24 2036 07/25/24 0406 07/25/24 0500 07/25/24 1633  BP: (!) 129/94 (!) 140/84  (!) 149/102  Pulse: 93 88  85  Resp: 17 19  15   Temp: 97.9 F (36.6 C) 97.6 F (36.4 C)  97.8 F (36.6 C)  TempSrc: Oral     SpO2: 98% 100%  99%  Weight:   113 kg   Height:        Intake/Output Summary (Last 24 hours) at 07/25/2024 1831 Last data filed at 07/25/2024 1700 Gross per 24 hour  Intake 1435.61 ml  Output 250 ml  Net 1185.61 ml   Filed Weights   07/23/24 0500 07/24/24 0500 07/25/24 0500  Weight: 116.7 kg 113.5 kg 113 kg    Examination:   Constitutional: NAD, AAOx3 HEENT: conjunctivae and lids normal, EOMI CV: No cyanosis.   RESP: normal respiratory effort, on RA Neuro: II - XII grossly intact.   Psych: Normal mood and affect.  Appropriate judgement and reason   Data Reviewed: I have personally reviewed labs and imaging studies  Time spent: 50 minutes  Ellouise Haber, MD [facility] If 7PM-7AM, please contact night-coverage 07/25/2024, 6:31 PM

## 2024-07-26 ENCOUNTER — Inpatient Hospital Stay

## 2024-07-26 DIAGNOSIS — N179 Acute kidney failure, unspecified: Secondary | ICD-10-CM | POA: Diagnosis not present

## 2024-07-26 LAB — GLUCOSE, CAPILLARY
Glucose-Capillary: 103 mg/dL — ABNORMAL HIGH (ref 70–99)
Glucose-Capillary: 106 mg/dL — ABNORMAL HIGH (ref 70–99)
Glucose-Capillary: 139 mg/dL — ABNORMAL HIGH (ref 70–99)
Glucose-Capillary: 78 mg/dL (ref 70–99)
Glucose-Capillary: 93 mg/dL (ref 70–99)
Glucose-Capillary: 98 mg/dL (ref 70–99)

## 2024-07-26 LAB — BASIC METABOLIC PANEL WITH GFR
Anion gap: 5 (ref 5–15)
BUN: 43 mg/dL — ABNORMAL HIGH (ref 8–23)
CO2: 23 mmol/L (ref 22–32)
Calcium: 8.2 mg/dL — ABNORMAL LOW (ref 8.9–10.3)
Chloride: 117 mmol/L — ABNORMAL HIGH (ref 98–111)
Creatinine, Ser: 2.2 mg/dL — ABNORMAL HIGH (ref 0.44–1.00)
GFR, Estimated: 24 mL/min — ABNORMAL LOW (ref >60)
Glucose, Bld: 117 mg/dL — ABNORMAL HIGH (ref 70–99)
Potassium: 4.3 mmol/L (ref 3.5–5.1)
Sodium: 145 mmol/L (ref 135–145)

## 2024-07-26 MED ORDER — IOHEXOL 300 MG/ML  SOLN
90.0000 mL | Freq: Once | INTRAMUSCULAR | Status: AC | PRN
  Administered 2024-07-26: 90 mL via ORAL

## 2024-07-26 MED ORDER — COPPER 2 MG PO TABS
2.0000 mg | Freq: Every day | Status: DC
  Administered 2024-07-27 – 2024-07-28 (×2): 2 mg via ORAL
  Filled 2024-07-26 (×2): qty 1

## 2024-07-26 MED ORDER — DEXTROSE 5 % IV SOLN: INTRAVENOUS | Status: AC

## 2024-07-26 MED ORDER — FREE WATER
30.0000 mL | Status: DC
Start: 2024-07-26 — End: 2024-07-27
  Administered 2024-07-26 – 2024-07-27 (×12): 30 mL

## 2024-07-26 MED ORDER — IOHEXOL 300 MG/ML  SOLN
60.0000 mL | Freq: Once | INTRAMUSCULAR | Status: AC | PRN
  Administered 2024-07-26: 60 mL

## 2024-07-26 NOTE — Progress Notes (Addendum)
 [facility] SURGICAL PROGRESS NOTE  Hospital Day(s): 10.   Post op day(s): 4 Days Post-Op.   Interval History:  Patient seen and examined No acute events or new complaints overnight.  Unfortunately continues to have intermittent emesis; she does feel like this is getting better Abdomen is sore expectedly No fever, chills BMP is baseline She is on regular diet    Vital signs in last 24 hours: [min-max] current  Temp:  [97.8 F (36.6 C)-98.1 F (36.7 C)] 98.1 F (36.7 C) (09/29 0450) Pulse Rate:  [76-85] 76 (09/29 0450) Resp:  [15-18] 18 (09/29 0450) BP: (126-150)/(81-102) 126/81 (09/29 0450) SpO2:  [99 %-100 %] 99 % (09/29 0450) Weight:  [113.6 kg] 113.6 kg (09/29 0500)     Height: 5' (152.4 cm) Weight: 113.6 kg BMI (Calculated): 48.91   Intake/Output last 2 shifts:  09/28 0701 - 09/29 0700 In: 1523.2 [P.O.:600; I.V.:898.8; IV Piggyback:24.4] Out: -    Physical Exam:  Constitutional: alert, cooperative and no distress  Respiratory: breathing non-labored at rest  Cardiovascular: regular rate and sinus rhythm  Gastrointestinal: obese, soft, non-tender, non-distended. Jejunostomy in the left abdomen; capped - balloon deflated this morning Integumentary: Laparoscopic incisions are CDI with dermabond, no erythema or drainage   Labs:     Latest Ref Rng & Units 07/20/2024    2:38 AM 07/19/2024    4:16 AM 07/18/2024    4:42 AM  CBC  WBC 4.0 - 10.5 K/uL 10.0  12.9  16.4   Hemoglobin 12.0 - 15.0 g/dL 8.7  8.8  89.9   Hematocrit 36.0 - 46.0 % 26.7  26.3  30.0   Platelets 150 - 400 K/uL 160  153  171       Latest Ref Rng & Units 07/26/2024    3:44 AM 07/25/2024    6:17 AM 07/23/2024    3:41 AM  CMP  Glucose 70 - 99 mg/dL 882  889    BUN 8 - 23 mg/dL 43  45    Creatinine 9.55 - 1.00 mg/dL 7.79  7.67  7.60   Sodium 135 - 145 mmol/L 145  150    Potassium 3.5 - 5.1 mmol/L 4.3  3.6    Chloride 98 - 111 mmol/L 117  116    CO2 22 - 32 mmol/L 23  23    Calcium   8.9 - 10.3 mg/dL 8.2  8.5       Imaging studies: No new pertinent imaging studies   Assessment/Plan:  68 y.o. female 4 Days Post-Op s/p robotic assisted laparoscopic feeding jejunostomy tube for severe malnutrition secondary to advanced renal cell carcinoma.   - Given her continued emesis with tube feedings, we will plan to get UGI with PO contrast and contrast via jejunostomy tube to ensure no obstruction or explanation to emesis. I will also deflate the balloon to ensure this is not causing a mechanical obstruction  - Hold tube feeds for now pending UGI  - Monitor abdominal examination; on-going bowel function   - No indication for surgical re-intervention currently  - Antiemetics prn   - Further management per primary service; we will follow  All of the above findings and recommendations were discussed with the patient, and the medical team, and all of patient's questions were answered to her expressed satisfaction.  -- Arthea Platt, PA-C [facility] 07/26/2024, 7:52 AM M-F: 7am - 4pm

## 2024-07-26 NOTE — Progress Notes (Signed)
 Nutrition Follow-up  DOCUMENTATION CODES:   Morbid obesity  INTERVENTION:   -Continue Glucerna Shake po TID, each supplement provides 220 kcal and 10 grams of protein  -Continue MVI with minerals BID -Continue 500 mg calcium  carbonate TID -Continue regular diet -TF via j-tube:    Osmolite 1.5 @ 20 ml/hr (trickles per MD)   Once able, increase by 10 ml every 12 hours to goal rate of 50 ml/hr.    60 ml Prosource TF20 daily   30 ml free water  flush every 4 hours   Tube feeding regimen provides 1880 kcal (100% of needs), 95 grams of protein, and 914 ml of H2O. Total free water : 1094 ml daily    -Continue 100 mg thiamine  daily x 7 days -Monitor Mg, K, and Phos and replete as needed secondary to high refeeding risk -Pt with history of gastric sleeve with poor oral intake and significant weight loss. RD will draw labs to rule out potential micronutrient deficiencies: iron, folic acid , thiamine , copper , zinc , vitamin B-12, vitamin D , vitamin A, vitamin E, vitamin A, and calcium  -When transitioning to home, recommend transition to nocturnal feedings. Pt is not a candidate for bolus feedings secondary to j-tube:   Nutren 1.5 (or equivalent) @ 80 ml/hr x 16 hours   30 ml free water  flush every 4 hours   Tube feeding regimen provides 1920 kcals, 87 grams of protein, and 978 ml of H2O.  Total free water : 1158 ml free water  daily   -Continue 1 mg folic acid  daily -2 mg copper  daily x 60 days   NUTRITION DIAGNOSIS:   Increased nutrient needs related to chronic illness (pancreatic mass) as evidenced by estimated needs.  Ongoing  GOAL:   Patient will meet greater than or equal to 90% of their needs  Progressing   MONITOR:   PO intake, Supplement acceptance, TF tolerance  REASON FOR ASSESSMENT:   Consult Enteral/tube feeding initiation and management  ASSESSMENT:   Pt with history of renal cell carcinoma, obstructive jaundice secondary to malignant neoplasm, mass in the  pancreas, paroxysmal atrial fibrillation on Eliquis , hypertension, obstructive sleep apnea, GERD, morbid obesity, who presents for chief concerns of acute kidney injury.  9/19- s/p Cholangiogram through biliary drain; common bile duct stent placement with covered metallic stent  9/25- s/p j-tube placement by surgery 9/26- TF held, x-ray confirmed j-tube in good position, TF resumed 9/27- TF held secondary to vomiting 9/29- contrast study revealed no evidence of obstruction from jejunostomy although no progression of PO contrast  Reviewed I/O's: +1.5 L x 24 hours and +13.4 L since admission  Pt sitting up in bed, receiving personal care at time of visit. Confirmed TF held due to emesis.   Per general surgery notes, plan for UGI with PO contrast no ensure no obstruction and explanation of emesis. Results reveal no evidence of obstruction from jejunostomy although no progression of PO contrast. Per general surgery, possible plan to resume TF tomorrow (07/27/24).   Case discussed with Holley Herring of [facility]; updated on discharge plan and needs.   Medications reviewed and include calcium  carbonate, folic acid , protonix , and thiamine .   Labs reviewed: CBGS: 93-106 (inpatient orders for glycemic control are none). Iron: 21, copper : 73, vitamin K: <10; zinc , vitamin B-12, vitamin D  WDL; vitamin A and vitamin E still pending.   Diet Order:   Diet Order             Diet regular Room service appropriate? Yes; Fluid consistency: Thin  Diet effective now  EDUCATION NEEDS:   Not appropriate for education at this time  Skin:  Skin Assessment: Skin Integrity Issues: Skin Integrity Issues:: Incisions Incisions: closed abdomen  Last BM:  07/25/24 (type 4)  Height:   Ht Readings from Last 1 Encounters:  07/22/24 5' (1.524 m)    Weight:   Wt Readings from Last 1 Encounters:  07/26/24 113.6 kg    Ideal Body Weight:  45.5 kg  BMI:  Body mass index is 48.91  kg/m.  Estimated Nutritional Needs:   Kcal:  1800-2000  Protein:  90-105 grams  Fluid:  1.8-2.0 L    Margery ORN, RD, LDN, CDCES Registered Dietitian III Certified Diabetes Care and Education Specialist If unable to reach this RD, please use RD Inpatient group chat on secure chat between hours of 8am-4 pm daily

## 2024-07-26 NOTE — Progress Notes (Signed)
 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  Patient reports that she has had poor p.o. intake since cancer diagnosis.  She also endorses recurrent nausea.  She has recently met with oncology and palliative care and is planning for biopsy of the pancreatic mass.  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  Expanding metallic biliary stent was placed with improved biliary patency, external biliary stent was removed.  Cirrhosis and further complicated by ongoing AKI, and goals of care discussions.   Ultimately patient has opted to proceed with surgical J-tube placement.  AKI on CKD 3B - Multifactorial.  Prerenal secondary to poor p.o. intake, complicated by massive renal mass - Baseline creatinine 0.76  71month ago. - Nephrology consulted  - Not a candidate for renal replacement therapy --cont MIVF  Nausea Abdominal fullness Poor oral intake - Secondary to pancreatic mass and large renal mass - Has been on scheduled Reglan  and is having minimal benefit - History of Roux-en-Y procedure, large pancreatic mass, renal mass, not a candidate for PEG tube placement. --J tube placed on 07/22/24 - Continue with p.o. as tolerated  Vomiting --large-volume vomiting since tube feeding started.   --hold tube feeding for now --UGI with PO contrast and contrast via jejunostomy tube   Hyponatremia --likely due to free water  deficit --cont MIVF as D5   Malignant pancreatic mass - Presumed primary cause of all of the above -  Patient previously seen [facility] oncology.  Prior biopsy revealed IPMN and patient was being monitored .  Appears she has now developed pancreatic cancer, but was still pending biopsy via EUS at time of admission. - CT scan from 8/12 reviewed: Right renal mass 11 x 9 x 9 cm, abutting the adjacent duodenum.  Mass of pancreatic head 3 x 5 cm. - Patient has recently established with oncology.  She was planning for outpatient biopsy but unfortunately with her decline in appetite, worsening kidney function, and poor functional status she would not be a candidate for any cancer therapies at this time.  She is not a surgical candidate.  Please see oncology notes from 9/22 for more detail - Patient was initially considering discharge home with hospice but has now opted for J-tube placement with hopes that this will increase her functional status. - CA 19-9 has resulted and is very elevated. - Palliative care consulted - 9/19: Cholangiogram through biliary drain, bile duct stent placement with covered metallic stent. --pt asked for code status to be changed to Full Code on 9/24 --pt asked for code status to be changed back to DNR-limited on 9/25.   Stage III metastatic renal cell carcinoma - Very large renal mass causing compression and upward displacement. - Previously on immunotherapy - Oncology follow-up as above   Paroxysmal A-fib --cont Eliquis    OSA - Continue CPAP at night   COPD, no acute exacerbation  Morbid obesity, BMI 43.92  Hx of DM, not currently active --d/c'ed BG checks and SSI    DVT prophylaxis: Lovenox  SQ Code Status: Full code  Family Communication:  Level of care: Telemetry Medical  Dispo:   The patient is from: home Anticipated d/c is to: home Anticipated d/c date is: 1-2 days   Subjective and Interval History:  No vomiting overnight, but vomited after drinking contrast for UGI.   Objective: Vitals:   07/26/24 0500 07/26/24 0811 07/26/24 1535 07/26/24 1651  BP:   124/83 (!) 145/100 (!) 142/91  Pulse:  80 79 78  Resp:  18 16 15   Temp:  98.1 F (36.7 C) 98.2 F (36.8 C) 98.3 F (36.8 C)  TempSrc:  Oral    SpO2:  98% 100% 97%  Weight: 113.6 kg     Height:        Intake/Output Summary (Last 24 hours) at 07/26/2024 1826 Last data filed at 07/26/2024 1702 Gross per 24 hour  Intake 735.94 ml  Output --  Net 735.94 ml   Filed Weights   07/24/24 0500 07/25/24 0500 07/26/24 0500  Weight: 113.5 kg 113 kg 113.6 kg    Examination:   Constitutional: NAD, AAOx3 HEENT: conjunctivae and lids normal, EOMI CV: No cyanosis.   RESP: normal respiratory effort, on RA Neuro: II - XII grossly intact.   Psych: Normal mood and affect.  Appropriate judgement and reason   Data Reviewed: I have personally reviewed labs and imaging studies  Time spent: 35 minutes  Ellouise Haber, MD [facility] If 7PM-7AM, please contact night-coverage 07/26/2024, 6:26 PM

## 2024-07-26 NOTE — Progress Notes (Signed)
 [facility]  ROUNDING NOTE   Subjective:   Patient seen laying in bed Alert Remains NPO Denies discomfort  Tube feeds not infusing  Creatinine 2.20  Objective:  Vital signs in last 24 hours:  Temp:  [97.8 F (36.6 C)-98.1 F (36.7 C)] 98.1 F (36.7 C) (09/29 0811) Pulse Rate:  [76-85] 80 (09/29 0811) Resp:  [15-18] 18 (09/29 0811) BP: (124-150)/(81-102) 124/83 (09/29 0811) SpO2:  [98 %-100 %] 98 % (09/29 0811) Weight:  [113.6 kg] 113.6 kg (09/29 0500)  Weight change: 0.6 kg Filed Weights   07/24/24 0500 07/25/24 0500 07/26/24 0500  Weight: 113.5 kg 113 kg 113.6 kg    Intake/Output: I/O last 3 completed shifts: In: 1662.9 [P.O.:600; I.V.:1038.6; IV Piggyback:24.4] Out: 250 [Emesis/NG output:250]   Intake/Output this shift:  Total I/O In: 240 [P.O.:240] Out: -   Physical Exam: General: NAD  Head: Normocephalic, atraumatic. Moist oral mucosal membranes  Eyes: Anicteric  Lungs:  Clear to auscultation, normal effort  Heart: Regular rate and rhythm  Abdomen:  Soft, nontender, Jtube  Extremities: No peripheral edema.  Neurologic: Awake and alert  Skin: Warm,dry, no rash       Basic Metabolic Panel: Recent Labs  Lab 07/20/24 0238 07/21/24 0435 07/22/24 0216 07/22/24 1343 07/23/24 0341 07/24/24 0300 07/25/24 0617 07/26/24 0344  NA 140  --   --   --   --   --  150* 145  K 4.0  --   --   --   --   --  3.6 4.3  CL 112*  --   --   --   --   --  116* 117*  CO2 21*  --   --   --   --   --  23 23  GLUCOSE 101*  --   --   --   --   --  110* 117*  BUN 44*  --   --   --   --   --  45* 43*  CREATININE 2.61* 2.44* 2.33*  --  2.39*  --  2.32* 2.20*  CALCIUM  8.4*  --   --  8.1*  --   --  8.5* 8.2*  MG 1.8  --   --  1.7 1.9 1.9 2.4  --   PHOS 2.7  --   --  3.3 3.2 2.9 2.4*  --     Liver Function Tests: Recent Labs  Lab 07/20/24 0238  AST 17  ALT 15  ALKPHOS 114  BILITOT 1.1  PROT 5.5*  ALBUMIN 2.1*   No results for input(s): LIPASE,  AMYLASE in the last 168 hours. No results for input(s): AMMONIA in the last 168 hours.  CBC: Recent Labs  Lab 07/20/24 0238  WBC 10.0  NEUTROABS 7.3  HGB 8.7*  HCT 26.7*  MCV 85.3  PLT 160    Cardiac Enzymes: No results for input(s): CKTOTAL, CKMB, CKMBINDEX, TROPONINI in the last 168 hours.  BNP: Invalid input(s): POCBNP  CBG: Recent Labs  Lab 07/25/24 1200 07/25/24 1649 07/25/24 2112 07/26/24 0002 07/26/24 0731  GLUCAP 97 99 103* 106* 103*    Microbiology: Results for orders placed or performed during the hospital encounter of 06/29/24  Culture, blood (x 2)     Status: None   Collection Time: 06/29/24  4:39 PM   Specimen: BLOOD  Result Value Ref Range Status   Specimen Description BLOOD LEFT ANTECUBITAL  Final   Special Requests   Final    BOTTLES  DRAWN AEROBIC AND ANAEROBIC Blood Culture adequate volume   Culture   Final    NO GROWTH 5 DAYS Performed at [facility], [address] [place], [place] [address]    Report Status 07/04/2024 FINAL  Final  Culture, blood (Routine X 2) w Reflex to ID Panel     Status: None   Collection Time: 06/29/24  7:02 PM   Specimen: BLOOD  Result Value Ref Range Status   Specimen Description BLOOD BLOOD LEFT HAND  Final   Special Requests   Final    BOTTLES DRAWN AEROBIC ONLY Blood Culture results may not be optimal due to an inadequate volume of blood received in culture bottles   Culture   Final    NO GROWTH 5 DAYS Performed at [facility], [address] [place], [place] [address]    Report Status 07/04/2024 FINAL  Final    Coagulation Studies: No results for input(s): LABPROT, INR in the last 72 hours.  Urinalysis: No results for input(s): COLORURINE, LABSPEC, PHURINE, GLUCOSEU, HGBUR, BILIRUBINUR, KETONESUR, PROTEINUR, UROBILINOGEN, NITRITE, LEUKOCYTESUR in the last 72 hours.  Invalid input(s): APPERANCEUR    Imaging: No results  found.   Medications:    dextrose  50 mL/hr at 07/25/24 2227   feeding supplement (OSMOLITE 1.5 CAL) Stopped (07/24/24 1330)    amiodarone   200 mg Oral Daily   apixaban   5 mg Oral BID   calcium  carbonate  1 tablet Oral TID   Chlorhexidine  Gluconate Cloth  6 each Topical Q0600   feeding supplement (GLUCERNA SHAKE)  237 mL Oral TID BM   feeding supplement (PROSource TF20)  60 mL Per Tube Daily   folic acid   1 mg Per Tube Daily   free water   30 mL Per Tube Q2H   lidocaine   10 mL Intradermal Once   metoCLOPramide  (REGLAN ) injection  5 mg Intravenous Q8H   metoprolol  tartrate  25 mg Oral BID   multivitamin with minerals  1 tablet Per Tube BID   OLANZapine   10 mg Oral QHS   pantoprazole   40 mg Oral Daily   sodium chloride  flush  10-40 mL Intracatheter Q12H   thiamine   100 mg Per Tube Daily   albuterol , bisacodyl , diazepam , famotidine , meclizine , ondansetron  (ZOFRAN ) IV, oxyCODONE , senna-docusate, sodium chloride  flush  Assessment/ Plan:  [name] is a 68 y.o.  female with past medical history of hypertension, GERD, OSA, obesity, pAfib, renal cell carcinoma, and malignant neoplasm of the pancreas, who was admitted to [facility] on 07/13/2024 for Dehydration [E86.0] Lack of appetite [R63.0] AKI (acute kidney injury) [N17.9]   Acute kidney injury on chronic kidney disease stage IIIb. Acute kidney injury appears prerenal due to persistent poor oral intake.Baseline creatinine 1.33 with GFR 44. Due to history of renal cell carcinoma and presence of pancreatic mass, palliative care consult would be appropriate to consider goals of care  Creatinine has shown little improvement with tube feeds. However patient has had emesis recently.Tube feeds held.  Will increase free water  flushes to every 2 hours and monitor.   Lab Results  Component Value Date   CREATININE 2.20 (H) 07/26/2024   CREATININE 2.32 (H) 07/25/2024   CREATININE 2.39 (H) 07/23/2024    Intake/Output Summary (Last 24 hours)  at 07/26/2024 1038 Last data filed at 07/26/2024 0811 Gross per 24 hour  Intake 1163.19 ml  Output --  Net 1163.19 ml   2. Acute metabolic acidosis, S bicarb 20 on admission. Likely secondary to kidney injury. S  bicarb corrected  3. Anemia of chronic kidney disease Lab Results  Component Value Date   HGB 8.7 (L) 07/20/2024   Avoiding ESA's due to cancer history. Hgb remains stable  4. Hypertension with chronic kidney disease. Prescribed Metoprolol  only.  Blood pressure 124/83   LOS: 10 Indy Kuck 9/29/202510:38 AM

## 2024-07-27 ENCOUNTER — Telehealth: Payer: Self-pay | Admitting: Emergency Medicine

## 2024-07-27 ENCOUNTER — Inpatient Hospital Stay

## 2024-07-27 ENCOUNTER — Telehealth: Payer: Self-pay | Admitting: *Deleted

## 2024-07-27 DIAGNOSIS — N179 Acute kidney failure, unspecified: Secondary | ICD-10-CM | POA: Diagnosis not present

## 2024-07-27 DIAGNOSIS — Z515 Encounter for palliative care: Secondary | ICD-10-CM | POA: Diagnosis not present

## 2024-07-27 LAB — BASIC METABOLIC PANEL WITH GFR
Anion gap: 8 (ref 5–15)
BUN: 39 mg/dL — ABNORMAL HIGH (ref 8–23)
CO2: 24 mmol/L (ref 22–32)
Calcium: 8.2 mg/dL — ABNORMAL LOW (ref 8.9–10.3)
Chloride: 114 mmol/L — ABNORMAL HIGH (ref 98–111)
Creatinine, Ser: 2.02 mg/dL — ABNORMAL HIGH (ref 0.44–1.00)
GFR, Estimated: 26 mL/min — ABNORMAL LOW (ref >60)
Glucose, Bld: 118 mg/dL — ABNORMAL HIGH (ref 70–99)
Potassium: 3.6 mmol/L (ref 3.5–5.1)
Sodium: 146 mmol/L — ABNORMAL HIGH (ref 135–145)

## 2024-07-27 LAB — GLUCOSE, CAPILLARY
Glucose-Capillary: 101 mg/dL — ABNORMAL HIGH (ref 70–99)
Glucose-Capillary: 106 mg/dL — ABNORMAL HIGH (ref 70–99)
Glucose-Capillary: 106 mg/dL — ABNORMAL HIGH (ref 70–99)
Glucose-Capillary: 112 mg/dL — ABNORMAL HIGH (ref 70–99)
Glucose-Capillary: 113 mg/dL — ABNORMAL HIGH (ref 70–99)
Glucose-Capillary: 119 mg/dL — ABNORMAL HIGH (ref 70–99)
Glucose-Capillary: 133 mg/dL — ABNORMAL HIGH (ref 70–99)
Glucose-Capillary: 135 mg/dL — ABNORMAL HIGH (ref 70–99)

## 2024-07-27 MED ORDER — FREE WATER
100.0000 mL | Status: DC
  Administered 2024-07-27 – 2024-08-03 (×42): 100 mL

## 2024-07-27 MED ORDER — OLANZAPINE 5 MG PO TABS
10.0000 mg | ORAL_TABLET | Freq: Every day | ORAL | Status: DC
  Administered 2024-07-27 – 2024-08-02 (×7): 10 mg
  Filled 2024-07-27 (×7): qty 2

## 2024-07-27 MED ORDER — SENNOSIDES-DOCUSATE SODIUM 8.6-50 MG PO TABS: 1.0000 | ORAL_TABLET | Freq: Every evening | ORAL | Status: DC | PRN

## 2024-07-27 MED ORDER — OSMOLITE 1.5 CAL PO LIQD: 1000.0000 mL | ORAL | Status: DC

## 2024-07-27 MED ORDER — METOPROLOL TARTRATE 25 MG PO TABS
25.0000 mg | ORAL_TABLET | Freq: Two times a day (BID) | ORAL | Status: DC
  Administered 2024-07-27 – 2024-08-03 (×14): 25 mg
  Filled 2024-07-27 (×14): qty 1

## 2024-07-27 MED ORDER — MECLIZINE HCL 25 MG PO TABS: 25.0000 mg | ORAL_TABLET | Freq: Three times a day (TID) | ORAL | Status: DC | PRN

## 2024-07-27 MED ORDER — OXYCODONE HCL 5 MG PO TABS
5.0000 mg | ORAL_TABLET | Freq: Three times a day (TID) | ORAL | Status: DC | PRN
Start: 2024-07-27 — End: 2024-08-03
  Administered 2024-07-27 – 2024-08-03 (×10): 5 mg
  Filled 2024-07-27 (×10): qty 1

## 2024-07-27 MED ORDER — DIAZEPAM 2 MG PO TABS: 2.0000 mg | ORAL_TABLET | Freq: Two times a day (BID) | ORAL | Status: DC | PRN

## 2024-07-27 MED ORDER — APIXABAN 5 MG PO TABS
5.0000 mg | ORAL_TABLET | Freq: Two times a day (BID) | ORAL | Status: DC
  Administered 2024-07-27 – 2024-08-03 (×14): 5 mg
  Filled 2024-07-27 (×14): qty 1

## 2024-07-27 MED ORDER — THIAMINE MONONITRATE 100 MG PO TABS
100.0000 mg | ORAL_TABLET | Freq: Every day | ORAL | Status: DC
Start: 2024-07-28 — End: 2024-07-31
  Administered 2024-07-28 – 2024-07-31 (×4): 100 mg
  Filled 2024-07-27 (×4): qty 1

## 2024-07-27 MED ORDER — OSMOLITE 1.5 CAL PO LIQD
1000.0000 mL | ORAL | Status: DC
  Administered 2024-07-27 – 2024-07-31 (×4): 1000 mL via JEJUNOSTOMY

## 2024-07-27 NOTE — Progress Notes (Signed)
 PROGRESS NOTE    Alyssa Leonard  FMW:[id number] DOB: December 03, 1955 DOA: 07/13/2024 PCP: [name] LABOR, MD  [id number]-[id number]  LOS: 11 days   Brief hospital course:   Assessment & Plan: Alyssa Leonard Mae 68 year old female with history of renal cell carcinoma, obstructive jaundice secondary to malignant neoplasm of the pancreas, paroxysmal A-fib on Eliquis , hypertension, OSA, GERD, morbid obesity, who presents to the ED with AKI.  Patient reports that she has had poor p.o. intake since cancer diagnosis.  She also endorses recurrent nausea.  She has recently met with oncology and palliative care and is planning for biopsy of the pancreatic mass.  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  Expanding metallic biliary stent was placed with improved biliary patency, external biliary stent was removed.  Cirrhosis and further complicated by ongoing AKI, and goals of care discussions.   Ultimately patient has opted to proceed with surgical J-tube placement.  AKI on CKD 3B - Multifactorial.  Prerenal secondary to poor p.o. intake, complicated by massive renal mass - Baseline creatinine 0.76  89month ago. - Nephrology consulted  - Not a candidate for renal replacement therapy --hydration via J-tube  Nausea Abdominal fullness Poor oral intake - Secondary to pancreatic mass and large renal mass - Has been on scheduled Reglan  and is having minimal benefit - History of Roux-en-Y procedure, large pancreatic mass, renal mass, not a candidate for PEG tube placement. --J tube placed on 07/22/24 --NPO for now while trying to get J-tube feeding to goal.  Vomiting --large-volume vomiting since tube feeding started.   --UGI on 9/29 showed contrast beyond jejunostomy, she did have trouble with PO contrast and emesis. --per dietician, place NGT for suctioning while continuing J-tube feed. NGT is just to keep her stomach  empty so she does not vomit so we can get her tube feeds to goal rate and allow that swelling time to decrease without gastric acid continuously going past it- It would serve the same purpose as a venting G-tube, only its temporary to see if there is swelling that will go down.  --NG tube placed today, with immediate greenish bile pouring out over . --cont NG tube to intermittent suctioning. --resume tube feed today starting with trickle rate.  Hypernatremia  --likely due to free water  deficit --free water  via J-tube   Malignant pancreatic mass - Presumed primary cause of all of the above - Patient previously seen [facility] oncology.  Prior biopsy revealed IPMN and patient was being monitored .  Appears she has now developed pancreatic cancer, but was still pending biopsy via EUS at time of admission. - CT scan from 8/12 reviewed: Right renal mass 11 x 9 x 9 cm, abutting the adjacent duodenum.  Mass of pancreatic head 3 x 5 cm. - Patient has recently established with oncology.  She was planning for outpatient biopsy but unfortunately with her decline in appetite, worsening kidney function, and poor functional status she would not be a candidate for any cancer therapies at this time.  She is not a surgical candidate.  Please see oncology notes from 9/22 for more detail - Patient was initially considering discharge home with hospice but has now opted for J-tube placement with hopes that this will increase her functional status. - CA 19-9 has resulted and is very elevated. - Palliative care consulted - 9/19: Cholangiogram through biliary drain, bile duct stent placement with covered metallic stent. --pt asked for code status  to be changed to Full Code on 9/24 --pt asked for code status to be changed back to DNR-limited on 9/25.   --Onc palliative care following, pt is not ready for hospice.   Stage III metastatic renal cell carcinoma - Very large renal mass causing compression and upward  displacement. - Previously on immunotherapy - Oncology follow-up as above --Onc palliative care following, pt is not ready for hospice.   Paroxysmal A-fib --cont Eliquis    OSA - Continue CPAP at night   COPD, no acute exacerbation  Morbid obesity, BMI 43.92  Hx of DM, not currently active --d/c'ed BG checks and SSI    DVT prophylaxis: Lovenox  SQ Code Status: Full code  Family Communication: husband updated at bedside today Level of care: Telemetry Medical Dispo:   The patient is from: home Anticipated d/c is to: home Anticipated d/c date is: 2-3 days   Subjective and Interval History:  After extensive discussion with GenSurg, Nephro, oncology, palliative, dietician, pt and husband, decision was made to insert NG tube for suctioning of stomach content while resuming J-tube feeding.   Objective: Vitals:   07/27/24 0727 07/27/24 1627 07/27/24 2017 07/27/24 2026  BP: (!) 117/92 (!) 149/92 133/79   Pulse: 92 78 86 89  Resp: 16 18 19    Temp: 98.2 F (36.8 C) 98.1 F (36.7 C) 97.6 F (36.4 C)   TempSrc:  Oral    SpO2: 100% 100% (!) 79% 99%  Weight:      Height:        Intake/Output Summary (Last 24 hours) at 07/27/2024 2137 Last data filed at 07/27/2024 2027 Gross per 24 hour  Intake 2149.81 ml  Output 350 ml  Net 1799.81 ml   Filed Weights   07/25/24 0500 07/26/24 0500 07/27/24 0500  Weight: 113 kg 113.6 kg 113.8 kg    Examination:   Constitutional: NAD, AAOx3 HEENT: conjunctivae and lids normal, EOMI CV: No cyanosis.   RESP: normal respiratory effort, on RA Neuro: II - XII grossly intact.   Psych: Normal mood and affect.  Appropriate judgement and reason   Data Reviewed: I have personally reviewed labs and imaging studies  Time spent: 50 minutes  Ellouise Haber, MD [facility] If 7PM-7AM, please contact night-coverage 07/27/2024, 9:37 PM

## 2024-07-27 NOTE — Progress Notes (Addendum)
 Nutrition Follow-up  DOCUMENTATION CODES:   Morbid obesity  INTERVENTION:   Once NGT in place to LIS, initiate tube feeds via J-tube  Osmolite 1.5@65ml /hr- Initiate at 27ml/hr and increase by 10ml/hr q 8 hours until goal rate is reached.   Free water  flushes 100ml q4 hours   Regimen provides 2340kcal/day, 98g/day protein and 1784ml/day of free water .   Copper  2mg  po daily x 60 days   Continue thiamine  100mg  daily via tube x 7 days   Pt at high refeed risk; recommend monitor potassium, magnesium  and phosphorus labs daily until stable  Daily weights   NUTRITION DIAGNOSIS:   Inadequate oral intake related to cancer and cancer related treatments as evidenced by other (comment) (pt with duodenal mass requiring J-tube placement). -new diagnosis   GOAL:   Patient will meet greater than or equal to 90% of their needs -not met   MONITOR:   PO intake, Labs, Weight trends, TF tolerance, I & O's, Skin  ASSESSMENT:   68 y/o female with h/o HTN, GERD, hepatitis B, s/p open cholecystectomy (1075), morbid obesity s/p laparoscopic sleeve gastrectomy (2019), DM, PAF, OSA, COPD, goiter, metastatic renal cell carcinoma on Ipilimumab (IPI) and Nivolumab (NIVO) immunotherapy at [facility], newly diagnosed pancreatic mass with biliary obstruction s/p IR drain placement 8/13 (replaced 8/26 & 9/2) and recent admission for sepsis and who is now admitted with FTT and dehydration now s/p robotic assisted laparoscopic feeding jejunostomy tube placement (1F) 9/25.  -J-tube placed 9/25 and trickle tube feeds initiated  -On 9/26, pt had emesis of 300 ml x4, tube feeds held, no vomiting overnight -Trickle tube feeds resumed after midnight 9/27 and by day shift 9/27, pt had large volume emesis x 2 and tube feeds held --No large vomit from 9/27 to 9/29 while tube feed was held (had maybe just 1 minor spit up in that time).  - Pt s/p UGI 9/29 with noted severe esophageal dysmotility and gastric stasis with  gastroesophageal reflux and subsequent emesis of administered oral contrast.  Pt continues to have nausea and vomiting with initiation of tube feeds. Tube feeds are being held. Pt is reporting ongoing nausea today. Pt has now been without adequate nutrition for > 7 days. Would recommend continue post pyloric feedings and advance to goal rate. It is unlikely that patient will vomit tube feed formula as feeding tube is noted in the jejunum and no ileus noted on KUB. Discussed pt's nutritional status with medical team. Per surgery, anatomy is not likely amendable to venting G-tube.  Spoke with patient and husband at bedside. Discussed pt's nutritional status and the importance of adequate nutrition needed to preserve lean muscle. Discussed that it is unclear as to why she continues to vomit but vomiting seems to coincide with tube feeds being initiated and an oral diet. Discussed the current options at this time are to do nothing and see if vomiting resolves on its own in a few days, to place NGT to LIS and resume tube feeds via J-tube or to move towards hospice or a path focused more on comfort. RD allowed patient a couple of hours to discuss options with her husband. Pt reports that she is not ready to die yet and she would like to move forward with NGT placement. Pt is aware that if she continues to be unable to tolerate her tube feeds that G-tube is not an option for her and that the plan would be to move to a more comfort based approach with hospice. Pt  is being followed by palliative care.    RD will initiate tube feeds once NGT in place to LIS. Pt remains at high refeed risk. Will continue thiamine  an additional week. Pt with mild hypernatremia noted; will add free water . Pt is having bowel function. Per chart, pt is up ~24lbs from her UBW; pt is noted to have edema in her BLE. Pt +15.2L on her I & Os but no UOP documented. Pt with hypervolemic hypernatremia; volume status discussed with medical team.    Medications reviewed and include: copper , folic acid , reglan , MVI, protonix , thiamine    Labs reviewed: Na 146(H), K 3.6 wnl, BUN 39(H), creat 2.02(H) P 2.4(L), Mg 2.4 wnl- 9/28 Iron 21(L), copper  73(L), vitamin D  36.32 wnl, B12 551 wnl, zinc  65 wnl- 9/25 Hgb 8.7(L), Hct 26.7(L)- 9/23 Cbgs- 112, 113, 106, 106 x 24 hrs   Nutrition Focused Physical Exam:  Flowsheet Row Most Recent Value  Orbital Region No depletion  Upper Arm Region No depletion  Thoracic and Lumbar Region No depletion  Buccal Region No depletion  Temple Region No depletion  Clavicle Bone Region No depletion  Clavicle and Acromion Bone Region No depletion  Scapular Bone Region No depletion  Dorsal Hand No depletion  Patellar Region No depletion  Anterior Thigh Region No depletion  Posterior Calf Region No depletion  Edema (RD Assessment) Moderate  [BLE]  Hair Reviewed  Eyes Reviewed  Mouth Reviewed  Skin Reviewed  Nails Reviewed   Diet Order:   Diet Order             Diet regular Room service appropriate? Yes; Fluid consistency: Thin  Diet effective now                  EDUCATION NEEDS:   No education needs have been identified at this time  Skin:  Skin Assessment: Skin Integrity Issues: Skin Integrity Issues:: Incisions Incisions: closed abdomen  Last BM:  9/29- type 4  Height:   Ht Readings from Last 1 Encounters:  07/22/24 5' (1.524 m)    Weight:   Wt Readings from Last 1 Encounters:  07/27/24 113.8 kg    Ideal Body Weight:  45.5 kg  BMI:  Body mass index is 49 kg/m.  Estimated Nutritional Needs:   Kcal:  2000-2300kcal/day  Protein:  100-115g/day  Fluid:  1.6-1.8L/day  Augustin Shams MS, RD, LDN If unable to be reached, please send secure chat to RD inpatient available from 8:00a-4:00p daily

## 2024-07-27 NOTE — Telephone Encounter (Signed)
 Hospitalized (attempts to call and reschedule were unsuccessful - pt VM full, husband no answer, daughter [facility])   Daughter, Katrina, called back, notified of appt canceled and reported unknown when pt would be discharged - asked to contact [facility] via MyChart or call center to reschedule appt

## 2024-07-27 NOTE — Progress Notes (Signed)
 Palliative Medicine [facility] at [facility] Telephone:([phone]) [phone] [phone]   Name: Alyssa Leonard Date: 07/27/2024 MRN: [id number]  DOB: Oct 23, 1956  Patient Care Team: [name] LABOR, MD as PCP - General (Internal Medicine) Darron Deatrice LABOR, MD as PCP - Cardiology (Cardiology) Luke Elsie GRADE, MD as PCP - Hematology/Oncology (Oncology) Kennyth Chew, MD as PCP - Electrophysiology (Cardiology) Melanee Annah BROCKS, MD as Consulting Physician (Oncology) Maurie Rayfield BIRCH, RN as Oncology Nurse Navigator    REASON FOR CONSULTATION: Alyssa Leonard is a 68 y.o. female with multiple medical problems including morbid obesity, OSA on CPAP, a flutter who could not afford Xarelto  and amiodarone , diabetes, and RCC with intra-abdominal metastasis previously on treatment with immunotherapy.  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  She was receiving treatment at [facility] but transferred care.  Patient was admitted to the hospital with AKI and presumed dehydration.  Palliative care consulted to address goals of manage ongoing symptoms.   CODE STATUS: DNR  PAST MEDICAL HISTORY: Past Medical History:  Diagnosis Date   Asthma    Atrial flutter (HCC)    COPD (chronic obstructive pulmonary disease) (HCC)    Diabetes mellitus without complication (HCC)    GERD (gastroesophageal reflux disease)    History of colon polyps    Hypertension    Meniere disease    OSA (obstructive sleep apnea)    Renal cell carcinoma (HCC)    Sleep apnea    Went for sleep study test in January 2018 I haven't heard back from test    PAST SURGICAL HISTORY:  Past Surgical History:  Procedure Laterality Date   ABDOMINAL HYSTERECTOMY     BARIATRIC SURGERY     CHOLECYSTECTOMY     COLONOSCOPY WITH PROPOFOL  N/A 12/11/2016   Procedure: COLONOSCOPY WITH PROPOFOL ;  Surgeon: Lamar ONEIDA Holmes, MD;  Location: [facility] ENDOSCOPY;  Service: Endoscopy;   Laterality: N/A;   COLONOSCOPY WITH PROPOFOL  N/A 11/27/2021   Procedure: COLONOSCOPY WITH PROPOFOL ;  Surgeon: Maryruth Ole ONEIDA, MD;  Location: [facility] ENDOSCOPY;  Service: Endoscopy;  Laterality: N/A;   INSERTION, JEJUNOSTOMY TUBE, ROBOT-ASSISTED N/A 07/22/2024   Procedure: INSERTION, JEJUNOSTOMY TUBE, ROBOT-ASSISTED;  Surgeon: Jordis Laneta FALCON, MD;  Location: [facility] ORS;  Service: General;  Laterality: N/A;   IR BILIARY STENT(S) EXIST ACCESS INC DILAT CATH EXCH/REMOVAL  07/16/2024   IR CONVERT BILIARY DRAIN TO INT EXT BILIARY DRAIN  06/29/2024   IR EXCHANGE BILIARY DRAIN  06/22/2024   IR INT EXT BILIARY DRAIN WITH CHOLANGIOGRAM  06/09/2024   IR RADIOLOGIST EVAL & MGMT  06/15/2024    HEMATOLOGY/ONCOLOGY HISTORY:  Oncology History  Renal cell carcinoma (HCC)  03/26/2024 Initial Diagnosis   Renal cell carcinoma (HCC)   07/22/2024 Cancer Staging   Staging form: Kidney, AJCC 8th Edition - Clinical: Stage IV (cT4, cN0, cM0) - Signed by Melanee Annah BROCKS, MD on 07/22/2024     ALLERGIES:  is allergic to latex, aspirin, and plasticized base [plastibase].  MEDICATIONS:  Current Facility-Administered Medications  Medication Dose Route Frequency Provider Last Rate Last Admin   albuterol  (PROVENTIL ) (2.5 MG/3ML) 0.083% nebulizer solution 2.5 mg  2.5 mg Nebulization Q6H PRN Pabon, Diego F, MD       amiodarone  (PACERONE ) tablet 200 mg  200 mg Oral Daily Pabon, Diego F, MD   200 mg at 07/27/24 9073   apixaban  (ELIQUIS ) tablet 5 mg  5 mg Oral BID Awanda City, MD   5 mg at  07/27/24 0925   bisacodyl  (DULCOLAX) EC tablet 5 mg  5 mg Oral Daily PRN Pabon, Diego F, MD       calcium  carbonate (TUMS - dosed in mg elemental calcium ) chewable tablet 200 mg of elemental calcium   1 tablet Oral TID Awanda City, MD   200 mg of elemental calcium  at 07/27/24 0925   Chlorhexidine  Gluconate Cloth 2 % PADS 6 each  6 each Topical Q0600 Jordis Laneta FALCON, MD   6 each at 07/27/24 9487   copper  tablet 2 mg  2 mg Oral Daily Awanda City, MD   2 mg  at 07/27/24 9074   dextrose  5 % solution   Intravenous Continuous Awanda City, MD 50 mL/hr at 07/26/24 1700 New Bag at 07/26/24 1700   diazepam  (VALIUM ) tablet 2 mg  2 mg Oral BID PRN Jordis Laneta F, MD   2 mg at 07/24/24 2025   famotidine  (PEPCID ) tablet 20 mg  20 mg Oral BID BM & HS PRN Jordis Laneta F, MD   20 mg at 07/23/24 2120   folic acid  (FOLVITE ) tablet 1 mg  1 mg Per Tube Daily Awanda City, MD   1 mg at 07/27/24 9073   free water  30 mL  30 mL Per Tube Q2H Breeze, Shantelle, NP   30 mL at 07/27/24 1453   lidocaine  (XYLOCAINE ) 1 % (with pres) injection 10 mL  10 mL Intradermal Once Pabon, Laneta F, MD       meclizine  (ANTIVERT ) tablet 25 mg  25 mg Per Tube TID PRN Awanda City, MD       metoCLOPramide  (REGLAN ) injection 5 mg  5 mg Intravenous Q8H Pabon, Diego F, MD   5 mg at 07/27/24 1440   metoprolol  tartrate (LOPRESSOR ) tablet 25 mg  25 mg Per Tube BID Awanda City, MD       multivitamin with minerals tablet 1 tablet  1 tablet Per Tube BID Clair Marolyn NOVAK, RPH   1 tablet at 07/27/24 9073   OLANZapine  (ZYPREXA ) tablet 10 mg  10 mg Per Tube QHS Awanda City, MD       ondansetron  (ZOFRAN ) injection 4 mg  4 mg Intravenous Q6H PRN Awanda City, MD   4 mg at 07/25/24 2118   oxyCODONE  (Oxy IR/ROXICODONE ) immediate release tablet 5 mg  5 mg Per Tube Q8H PRN Awanda City, MD       pantoprazole  (PROTONIX ) EC tablet 40 mg  40 mg Oral Daily Pabon, Diego F, MD   40 mg at 07/27/24 9073   senna-docusate (Senokot-S) tablet 1 tablet  1 tablet Per Tube QHS PRN Awanda City, MD       sodium chloride  flush (NS) 0.9 % injection 10-40 mL  10-40 mL Intracatheter Q12H Pabon, Diego F, MD   10 mL at 07/27/24 9071   sodium chloride  flush (NS) 0.9 % injection 10-40 mL  10-40 mL Intracatheter PRN Jordis Laneta FALCON, MD       [START ON 07/28/2024] thiamine  (VITAMIN B1) tablet 100 mg  100 mg Per Tube Daily Awanda City, MD        VITAL SIGNS: BP (!) 117/92 (BP Location: Left Arm)   Pulse 92   Temp 98.2 F (36.8 C)   Resp 16   Ht 5' (1.524  m)   Wt 250 lb 14.1 oz (113.8 kg)   SpO2 100%   BMI 49.00 kg/m  Filed Weights   07/25/24 0500 07/26/24 0500 07/27/24 0500  Weight: 249 lb 1.9 oz (113 kg)  250 lb 7.1 oz (113.6 kg) 250 lb 14.1 oz (113.8 kg)    Estimated body mass index is 49 kg/m as calculated from the following:   Height as of this encounter: 5' (1.524 m).   Weight as of this encounter: 250 lb 14.1 oz (113.8 kg).  LABS: CBC:    Component Value Date/Time   WBC 10.0 07/20/2024 0238   HGB 8.7 (L) 07/20/2024 0238   HGB 12.2 08/15/2023 1147   HCT 26.7 (L) 07/20/2024 0238   PLT 160 07/20/2024 0238   PLT 181 08/15/2023 1147   MCV 85.3 07/20/2024 0238   NEUTROABS 7.3 07/20/2024 0238   LYMPHSABS 1.3 07/20/2024 0238   MONOABS 1.2 (H) 07/20/2024 0238   EOSABS 0.2 07/20/2024 0238   BASOSABS 0.0 07/20/2024 0238   Comprehensive Metabolic Panel:    Component Value Date/Time   NA 146 (H) 07/27/2024 0433   K 3.6 07/27/2024 0433   CL 114 (H) 07/27/2024 0433   CO2 24 07/27/2024 0433   BUN 39 (H) 07/27/2024 0433   CREATININE 2.02 (H) 07/27/2024 0433   CREATININE 1.01 (H) 08/15/2023 1147   GLUCOSE 118 (H) 07/27/2024 0433   CALCIUM  8.2 (L) 07/27/2024 0433   AST 17 07/20/2024 0238   AST 16 08/15/2023 1147   ALT 15 07/20/2024 0238   ALT 12 08/15/2023 1147   ALKPHOS 114 07/20/2024 0238   BILITOT 1.1 07/20/2024 0238   BILITOT 0.6 08/15/2023 1147   PROT 5.5 (L) 07/20/2024 0238   ALBUMIN 2.1 (L) 07/20/2024 0238    RADIOGRAPHIC STUDIES: DG ABD ACUTE 2+V W 1V CHEST Result Date: 07/27/2024 EXAM: UPRIGHT AND SUPINE XRAY VIEWS OF THE ABDOMEN AND 4 VIEW(S) OF THE CHEST 07/27/2024 08:00:00 AM COMPARISON: 07/23/2024 CLINICAL HISTORY: Ileus following gastrointestinal surgery FINDINGS: LUNGS AND PLEURA: Low lung volumes. No consolidation or pulmonary edema. No pleural effusion or pneumothorax. HEART AND MEDIASTINUM: No acute abnormality of the cardiac and mediastinal silhouettes. BOWEL: Nonobstructive bowel gas pattern. Contrast  throughout the colon. PERITONEUM AND SOFT TISSUES: Biliary stent is present. Multiple surgical clips in the left upper quadrant. Vascular stent is present. No abnormal calcifications. No free air. BONES: No acute osseous abnormality. IMPRESSION: 1. No radiographic bowel obstruction or free air. 2. Low lung volumes. Electronically signed by: Donnice Mania MD 07/27/2024 11:09 AM EDT RP Workstation: [id number]   DG UGI W SINGLE CM (SOL OR THIN BA) Result Date: 07/26/2024 CLINICAL DATA:  68 year old female with history of RCC and pancreatic mass with CBD obstruction status post CBD stent placement and jejunostomy tube placement. Patient now with intolerance of jejunal tube feeds and emesis with p.o. intake. EXAM: DG UGI W SINGLE CM TECHNIQUE: Single contrast examination was then performed using thin liquid barium. This exam was performed by Carlin Griffon, PA-C, and was supervised and interpreted by Harrietta Sherry, MD. FLUOROSCOPY: Radiation Exposure Index (as provided by the fluoroscopic device): 122.60 mGy Kerma COMPARISON:  None Available. FINDINGS: Esophagus:  Patulous esophagus. Esophageal motility: Severe dysmotility and delayed emptying into the gastric lumen, requiring table tilt at 25 degrees for passage of contrast past the LES. Gastroesophageal reflux: Spontaneous and frank reflux noted in prone position. Patient did vomit despite table tilt. Ingested 13mm barium tablet: Not given Stomach: Normal appearance. No hiatal hernia. Gastric emptying: Complete absence of contrast past gastric pylorus after 20 minutes following administration of 100 cc of PO contrast. Patient ultimately had an episode of emesis. Duodenum:  Not opacified.  Unable to assess. Other: 50 cc of contrast administered via  the jejunostomy tube. Normal opacification of mid to distal small bowel loops. Patient endorsed uncomfortable pressure upon contrast administration. Study was severely limited by patient's body habitus and severe  dizziness with attempts to rotate. Study performed with 25 degrees table tilt. IMPRESSION: 1. Normal opacification of small bowel distal to jejunostomy tube. Patient endorsed sensation of discomfort and pressure. 2. Severe esophageal dysmotility and gastric stasis with gastroesophageal reflux and subsequent emesis of administered oral contrast. Electronically Signed   By: Harrietta Sherry M.D.   On: 07/26/2024 16:29   DG ABD ACUTE 2+V W 1V CHEST Result Date: 07/23/2024 CLINICAL DATA:  Ileus following gastrointestinal surgery. EXAM: DG ABDOMEN ACUTE WITH 1 VIEW CHEST COMPARISON:  Chest radiographs 03/30/2024. Abdominal radiographs 06/30/2024. Abdominopelvic CT 06/08/2024. FINDINGS: Low lung volumes. The heart size is stable at the upper limits of normal. The mediastinal contours are normal. The lungs appear clear. No pleural effusion or pneumothorax. A metallic biliary stent has been placed without obvious pneumobilia. There is a normal, nonobstructive bowel gas pattern. Multiple surgical clips are present in the left upper quadrant of the abdomen and in the pelvis. No evidence of pneumoperitoneum or acute osseous abnormality. Lumbar facet arthropathy noted. IMPRESSION: No evidence of acute cardiopulmonary or abdominal process. Interval biliary stent placement without obvious pneumobilia. Electronically Signed   By: Elsie Perone M.D.   On: 07/23/2024 15:10   IR BILIARY STENT(S) EXIST ACCESS INC DILAT CATH EXCH/REMOVAL Result Date: 07/16/2024 INDICATION: Common bile duct obstruction secondary to pancreatic mass and status post previous placement of internal/external biliary drain. Bilirubin has now normalized and the patient presents for possible internalization of biliary stenting via percutaneous approach. EXAM: BILIARY STENT PLACEMENT VIA PRE-EXISTING INTERNAL/EXTERNAL DRAIN APPROACH INCLUDING CHOLANGIOGRAM MEDICATIONS: 2 g IV cefoxitin ; The antibiotic was administered within an appropriate time frame  prior to the initiation of the procedure. ANESTHESIA/SEDATION: Moderate (conscious) sedation was employed during this procedure. A total of Versed  3.0 mg and Fentanyl  125 mcg was administered intravenously by the radiology nurse. Total intra-service moderate Sedation Time: 51 minutes. The patient's level of consciousness and vital signs were monitored continuously by radiology nursing throughout the procedure under my direct supervision. FLUOROSCOPY: Radiation Exposure Index (as provided by the fluoroscopic device): 469 mGy Kerma COMPLICATIONS: None immediate. PROCEDURE: Informed written consent was obtained from the patient after a thorough discussion of the procedural risks, benefits and alternatives. All questions were addressed. Maximal Sterile Barrier Technique was utilized including caps, mask, sterile gowns, sterile gloves, sterile drape, hand hygiene and skin antiseptic. A timeout was performed prior to the initiation of the procedure. Initial cholangiogram was performed through the pre-existing internal/external biliary drainage catheter. A radiopaque stent guided ruler had been placed along the patient's back in order to guide measurement assessment for stent placement. The drain was then cut and removed over a guidewire. A 7 French sheath was advanced over the wire to the level of the duodenum. Additional contrast and air was injected to outline the duodenal lumen. The sheath was retracted across the common bile duct in order to define length of common bile duct stricture/obstruction. A 10 mm x 60 mm Wallflex covered biliary stent prosthesis was chosen for placement. The covered stent deployment system was advanced over a guidewire to the level of the duodenum and retracted into appropriate position. The stent was then deployed across the common bile duct. After stent deployment, the stent lumen was additionally dilated with an 8 mm x 40 mm Athletis balloon. Balloon dilatation was performed in 2 different  segments in order to cover the stented segment. The balloon was deflated and removed. Additional cholangiogram was then performed through the 7 French sheath positioned at the confluence of right and left intrahepatic bile ducts as well as in the right intrahepatic bile ducts. Percutaneous right-sided biliary access was then removed with removal of guidewire and sheath. A gauze dressing was applied over the percutaneous access site. 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 Intrahepatic bile ducts are relatively decompressed. Based on length of estimated stricture, a 10 mm x 60 mm covered self expanding biliary stent was chosen for placement. After stent deployment, due to persistent narrowing of the stent in its midportion, the stent was further post dilated to 8 mm resulting in improved patency. The stent is well positioned extending from just below the biliary confluence of right and left intrahepatic ducts to the level of the duodenum. Excellent antegrade flow is documented through the stent after placement which allowed complete removal of percutaneous access on completion. 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 The covered stent was additionally post dilated with an 8 mm balloon and demonstrates excellent patency extending from just below the biliary confluence to the duodenum with widely patent flow demonstrated after stent placement. Percutaneous biliary access was removed upon completion of the procedure. Electronically Signed   By: Marcey Moan M.D.   On: 07/16/2024 16:02   DG Abd 2 Views Result Date: 07/01/2024 CLINICAL DATA:  Constipation. EXAM: ABDOMEN - 2 VIEW COMPARISON:  CT 06/08/2024 FINDINGS: No bowel dilatation or evidence of  obstruction. Air and small volume of stool throughout nondilated colon. Small volume of stool in the rectum. Multiple surgical clips in the left upper quadrant. A few surgical clips in the pelvis. Right upper quadrant biliary stent. No free intra-abdominal air. IMPRESSION: Small volume formed stool in the colon. No bowel obstruction. Electronically Signed   By: Andrea Gasman M.D.   On: 07/01/2024 00:03   IR CONVERT BILIARY DRAIN TO INT EXT BILIARY DRAIN Result Date: 06/29/2024 INDICATION: Biliary obstruction in a patient with an indwelling biliary drain previously placed 06/09/2024. On 06/22/2024, the patient returned for further evaluation to find that the catheter had partially retracted internally with a redundant loop of the 10 [place] biliary drain extra capsular to the liver. Despite multiple attempts to gain access across the common bile duct obstruction, the biliary drainage catheter would not track across this obstruction and a drain was left in the common bile duct. The patient returns today for further evaluation and potential new access or converting the catheter to a better positioned internal/external biliary drainage catheter EXAM: Drain exchange COMPARISON:  None Available. ANESTHESIA/SEDATION: Moderate (conscious) sedation was employed during this procedure. A total of Versed  3 mg and Fentanyl  200 mcg was administered intravenously by the radiology nurse. Total intra-service moderate Sedation Time: 30 minutes. The patient's level of consciousness and vital signs were monitored continuously by radiology nursing throughout the procedure under my direct supervision. CONTRAST:  25 mL Omnipaque  300-administered into the collecting system(s) FLUOROSCOPY: Radiation Exposure Index (as provided by the fluoroscopic device): 406 mGy Kerma COMPLICATIONS: None immediate. PROCEDURE: Informed written consent was obtained from the patient after a thorough discussion of the procedural risks, benefits and  alternatives. All questions were addressed. Maximal Sterile Barrier Technique was utilized including  caps, mask, sterile gowns, sterile gloves, sterile drape, hand hygiene and skin antiseptic. A timeout was performed prior to the initiation of the procedure. In a supine position, the right upper quadrant and external portion of the catheter were prepped and draped in usual sterile fashion. Dilute contrast was injected into the indwelling biliary drain filling the right lobe bile ducts as well as the common bile duct. Common bile duct is markedly dilated with moderate dilatation of the intrahepatic ducts. Multiple attempts were then made to advance a guidewire through the pigtail catheter and out through the ampulla into the duodenal without success. The pigtail catheter was cut and removed over a Bentson guidewire. An angled glide catheter was then advanced over the Bentson and attempts at reducing the redundant extra capsular loop were unsuccessful. The catheter was then directed toward the ampulla within the common bile duct and using a combination of Glidewire and Bentson guidewire finally gaining access into the duodenum. Once duodenal access was obtained, contrast was injected verifying intraluminal position within the bowel. The Bentson guidewire was then advanced into the jejunum until enough guidewire was demonstrated to be looping redundantly within the jejunum giving a long length of guidewire position within the small bowel. The glide catheter was then withdrawn. Slow gentle traction was then applied to the guidewire with minimal reduction in the redundant extra capsular loop. Rapid and somewhat forceful traction was then applied to the guidewire and the redundant loop was completely reduced. At this point a 6 French 25 cm sheath was advanced over the guidewire into the common bile duct and directed toward the ampulla. The introducer was then removed and the angled glide catheter was advanced over the  guidewire into the jejunum. The guidewire was then removed and exchanged for an Amplatz wire. The sheath and catheter were then completely removed leaving the guidewire in position. An 8 [place] by 40 cm skater biliary drain was advanced over the guidewire and coiled within the duodenal. Contrast was again injected demonstrating final position. Retention suture was applied. Sterile dressing applied. Catheter was connected to gravity drainage. IMPRESSION: Satisfactory reduction of the extra capsular loop and a advancement of the distal tip of the catheter into the bowel completing the internal/external biliary drain placement in a good position for full function. The catheter will remain connected to gravity drainage. The patient is being admitted for tachycardia, worsening renal function and likely underlying sepsis. IR will continue to monitor bilirubin and drain function. Plan for capping once bilirubin has normalized. The patient had previous difficulty with the internal external drain care and may need extra education and support. Short-term follow-up in the clinic after discharge would likely be beneficial. The patient will be scheduled for a 4 week return for drain change and evaluation as well. Electronically Signed   By: Cordella Banner   On: 06/29/2024 14:14    PERFORMANCE STATUS (ECOG) : 4 - Bedbound  Review of Systems Unless otherwise noted, a complete review of systems is negative.  Physical Exam General: NAD Cardiovascular: regular rate and rhythm Pulmonary: clear ant fields Abdomen: soft, nontender, + bowel sounds GU: no suprapubic tenderness Extremities: no edema, no joint deformities Skin: no rashes Neurological: Weakness but otherwise nonfocal  IMPRESSION: Follow-up visit.  Patient accompanied by her husband.  Patient status post J-tube.  However, has been unable to tolerate feedings with severe nausea/vomiting when feedings are attempted.  UGI showed severe esophageal  dysmotility and gastric stasis with reflux but no evidence of obstruction.  Surgical  team following and plan is for NGT insertion for gastric decompression.  I met with patient and her husband to discuss goals I reiterated lack of options for treatment of her cancer and expectation of progression/decline.  Patient seem rather tired and said she really just wanted to go home.  We again discussed the option of her going home with hospice and focusing on comfort/end-of-life care.  Her husband said he would prefer patient to stay in the hospital but agreed that they would further discuss as a family.  PLAN: - Recommend best supportive care - DNR/DNI - Will follow  Case and plan discussed with Dr. Melanee   Time Total: 30 minutes  Visit consisted of counseling and education dealing with the complex and emotionally intense issues of symptom management and palliative care in the setting of serious and potentially life-threatening illness.Greater than 50%  of this time was spent counseling and coordinating care related to the above assessment and plan.  Signed by: Fonda Mower, PhD, NP-C

## 2024-07-27 NOTE — Plan of Care (Signed)
  Problem: Skin Integrity: Goal: Risk for impaired skin integrity will decrease Outcome: Progressing   Problem: Nutrition: Goal: Adequate nutrition will be maintained Outcome: Progressing   Problem: Clinical Measurements: Goal: Diagnostic test results will improve Outcome: Progressing

## 2024-07-27 NOTE — Progress Notes (Signed)
 [facility] SURGICAL PROGRESS NOTE  Hospital Day(s): 11.   Post op day(s): 5 Days Post-Op.   Interval History:  Patient seen and examined No acute events or new complaints overnight.  She reports she has discomfort at jejunostomy tube site itself.  Renal function near baseline; sCr - 2.02; UO - unmeasured x4 Mild hypernatremia to 146 UGI yesterday does show contrast beyond jejunostomy, she did have trouble with PO contrast and emesis KUB this morning with contrast throughout the colon   Vital signs in last 24 hours: [min-max] current  Temp:  [98 F (36.7 C)-98.3 F (36.8 C)] 98.2 F (36.8 C) (09/30 0727) Pulse Rate:  [60-95] 92 (09/30 0727) Resp:  [15-18] 16 (09/30 0727) BP: (117-157)/(72-100) 117/92 (09/30 0727) SpO2:  [82 %-100 %] 100 % (09/30 0727) Weight:  [113.8 kg] 113.8 kg (09/30 0500)     Height: 5' (152.4 cm) Weight: 113.8 kg BMI (Calculated): 49   Intake/Output last 2 shifts:  09/29 0701 - 09/30 0700 In: 1535.1 [P.O.:460; I.V.:865.1; NG/GT:210] Out: -    Physical Exam:  Constitutional: alert, cooperative and no distress  Respiratory: breathing non-labored at rest  Cardiovascular: regular rate and sinus rhythm  Gastrointestinal: obese, soft, non-tender, non-distended. Jejunostomy in the left abdomen; capped Integumentary: Laparoscopic incisions are CDI with dermabond, no erythema or drainage   Labs:     Latest Ref Rng & Units 07/20/2024    2:38 AM 07/19/2024    4:16 AM 07/18/2024    4:42 AM  CBC  WBC 4.0 - 10.5 K/uL 10.0  12.9  16.4   Hemoglobin 12.0 - 15.0 g/dL 8.7  8.8  89.9   Hematocrit 36.0 - 46.0 % 26.7  26.3  30.0   Platelets 150 - 400 K/uL 160  153  171       Latest Ref Rng & Units 07/27/2024    4:33 AM 07/26/2024    3:44 AM 07/25/2024    6:17 AM  CMP  Glucose 70 - 99 mg/dL 881  882  889   BUN 8 - 23 mg/dL 39  43  45   Creatinine 0.44 - 1.00 mg/dL 7.97  7.79  7.67   Sodium 135 - 145 mmol/L 146  145  150   Potassium 3.5 - 5.1  mmol/L 3.6  4.3  3.6   Chloride 98 - 111 mmol/L 114  117  116   CO2 22 - 32 mmol/L 24  23  23    Calcium  8.9 - 10.3 mg/dL 8.2  8.2  8.5      Imaging studies:   KUB (07/27/2024) personally reviewed with contrast noted throughout the colon, no small bowel dilation, and radiologist report pending...  Assessment/Plan:  68 y.o. female 5 Days Post-Op s/p robotic assisted laparoscopic feeding jejunostomy tube for severe malnutrition secondary to advanced renal cell carcinoma.   - KUB this morning noted to have contrast (given via J-tube yesterday during UGI) throughout the colon without residual small bowel obstruction. I do not think she is obstructed beyond her jejunostomy tube. As such, it is reasonable to resume tube feedings gradually.   - I do worry that her nausea/emesis is more secondary to her malignant process and possible extrinsic compression on her gastric outlet/duodenum. This is very advance and sadly there is nothing to offer from a surgical perspective.   - Had a discussion regarding diet as her symptoms seem to resolve with fasting. Again, this is a difficult situation. I encouraged her to have small portions and prioritize  liquids. However, pending on how aggressive she wishes to be, could consider allow normal PO intake for comfort understanding risk of continue nausea/emesis  - I think continued discussion with oncology/palliative care are reasonable; seems there was discussion on 09/25 for hospice at home   - Monitor abdominal examination; on-going bowel function   - No indication for surgical re-intervention currently  - Antiemetics prn   - Further management per primary service  All of the above findings and recommendations were discussed with the patient, and the medical team, and all of patient's questions were answered to her expressed satisfaction.  -- Arthea Platt, PA-C [facility] 07/27/2024, 7:41 AM M-F: 7am - 4pm

## 2024-07-27 NOTE — Plan of Care (Signed)
  Problem: Fluid Volume: Goal: Ability to maintain a balanced intake and output will improve Outcome: Progressing   Problem: Metabolic: Goal: Ability to maintain appropriate glucose levels will improve Outcome: Progressing   Problem: Clinical Measurements: Goal: Ability to maintain clinical measurements within normal limits will improve Outcome: Progressing   Problem: Nutrition: Goal: Adequate nutrition will be maintained Outcome: Progressing   Problem: Pain Managment: Goal: General experience of comfort will improve and/or be controlled Outcome: Progressing   Problem: Safety: Goal: Ability to remain free from injury will improve Outcome: Progressing

## 2024-07-27 NOTE — Telephone Encounter (Signed)
 The doctor wants to speak to Dr. Melanee about the ERCP with stent internalization for stent at [facility]

## 2024-07-27 NOTE — Progress Notes (Signed)
 [facility]  ROUNDING NOTE   Subjective:   Seen resting in bed No family present Continues to have mild nausea  Creatinine 2.02  Objective:  Vital signs in last 24 hours:  Temp:  [98 F (36.7 C)-98.3 F (36.8 C)] 98.2 F (36.8 C) (09/30 0727) Pulse Rate:  [60-95] 92 (09/30 0727) Resp:  [15-18] 16 (09/30 0727) BP: (117-157)/(72-100) 117/92 (09/30 0727) SpO2:  [82 %-100 %] 100 % (09/30 0727) Weight:  [113.8 kg] 113.8 kg (09/30 0500)  Weight change: 0.2 kg Filed Weights   07/25/24 0500 07/26/24 0500 07/27/24 0500  Weight: 113 kg 113.6 kg 113.8 kg    Intake/Output: I/O last 3 completed shifts: In: 1762.4 [P.O.:460; I.V.:1092.4; NG/GT:210] Out: -    Intake/Output this shift:  Total I/O In: 270 [P.O.:240; NG/GT:30] Out: -   Physical Exam: General: NAD  Head: Normocephalic, atraumatic. Moist oral mucosal membranes  Eyes: Anicteric  Lungs:  Clear to auscultation, normal effort  Heart: Regular rate and rhythm  Abdomen:  Soft, nontender, Jtube  Extremities: No peripheral edema.  Neurologic: Awake and alert  Skin: Warm,dry, no rash       Basic Metabolic Panel: Recent Labs  Lab 07/22/24 0216 07/22/24 1343 07/22/24 1343 07/23/24 0341 07/24/24 0300 07/25/24 0617 07/26/24 0344 07/27/24 0433  NA  --   --   --   --   --  150* 145 146*  K  --   --   --   --   --  3.6 4.3 3.6  CL  --   --   --   --   --  116* 117* 114*  CO2  --   --   --   --   --  23 23 24   GLUCOSE  --   --   --   --   --  110* 117* 118*  BUN  --   --   --   --   --  45* 43* 39*  CREATININE 2.33*  --   --  2.39*  --  2.32* 2.20* 2.02*  CALCIUM   --  8.1*   < >  --   --  8.5* 8.2* 8.2*  MG  --  1.7  --  1.9 1.9 2.4  --   --   PHOS  --  3.3  --  3.2 2.9 2.4*  --   --    < > = values in this interval not displayed.    Liver Function Tests: No results for input(s): AST, ALT, ALKPHOS, BILITOT, PROT, ALBUMIN in the last 168 hours.  No results for input(s): LIPASE,  AMYLASE in the last 168 hours. No results for input(s): AMMONIA in the last 168 hours.  CBC: No results for input(s): WBC, NEUTROABS, HGB, HCT, MCV, PLT in the last 168 hours.   Cardiac Enzymes: No results for input(s): CKTOTAL, CKMB, CKMBINDEX, TROPONINI in the last 168 hours.  BNP: Invalid input(s): POCBNP  CBG: Recent Labs  Lab 07/26/24 2108 07/27/24 0007 07/27/24 0448 07/27/24 0725 07/27/24 1140  GLUCAP 98 106* 106* 113* 112*    Microbiology: Results for orders placed or performed during the hospital encounter of 06/29/24  Culture, blood (x 2)     Status: None   Collection Time: 06/29/24  4:39 PM   Specimen: BLOOD  Result Value Ref Range Status   Specimen Description BLOOD LEFT ANTECUBITAL  Final   Special Requests   Final    BOTTLES DRAWN AEROBIC AND ANAEROBIC Blood Culture  adequate volume   Culture   Final    NO GROWTH 5 DAYS Performed at [facility], [address] [place], [place] [address]    Report Status 07/04/2024 FINAL  Final  Culture, blood (Routine X 2) w Reflex to ID Panel     Status: None   Collection Time: 06/29/24  7:02 PM   Specimen: BLOOD  Result Value Ref Range Status   Specimen Description BLOOD BLOOD LEFT HAND  Final   Special Requests   Final    BOTTLES DRAWN AEROBIC ONLY Blood Culture results may not be optimal due to an inadequate volume of blood received in culture bottles   Culture   Final    NO GROWTH 5 DAYS Performed at [facility], [address], [place], [place] [address]    Report Status 07/04/2024 FINAL  Final    Coagulation Studies: No results for input(s): LABPROT, INR in the last 72 hours.  Urinalysis: No results for input(s): COLORURINE, LABSPEC, PHURINE, GLUCOSEU, HGBUR, BILIRUBINUR, KETONESUR, PROTEINUR, UROBILINOGEN, NITRITE, LEUKOCYTESUR in the last 72 hours.  Invalid input(s): APPERANCEUR    Imaging: DG ABD ACUTE 2+V W 1V CHEST Result  Date: 07/27/2024 EXAM: UPRIGHT AND SUPINE XRAY VIEWS OF THE ABDOMEN AND 4 VIEW(S) OF THE CHEST 07/27/2024 08:00:00 AM COMPARISON: 07/23/2024 CLINICAL HISTORY: Ileus following gastrointestinal surgery FINDINGS: LUNGS AND PLEURA: Low lung volumes. No consolidation or pulmonary edema. No pleural effusion or pneumothorax. HEART AND MEDIASTINUM: No acute abnormality of the cardiac and mediastinal silhouettes. BOWEL: Nonobstructive bowel gas pattern. Contrast throughout the colon. PERITONEUM AND SOFT TISSUES: Biliary stent is present. Multiple surgical clips in the left upper quadrant. Vascular stent is present. No abnormal calcifications. No free air. BONES: No acute osseous abnormality. IMPRESSION: 1. No radiographic bowel obstruction or free air. 2. Low lung volumes. Electronically signed by: Donnice Mania MD 07/27/2024 11:09 AM EDT RP Workstation: [id number]   DG UGI W SINGLE CM (SOL OR THIN BA) Result Date: 07/26/2024 CLINICAL DATA:  68 year old female with history of RCC and pancreatic mass with CBD obstruction status post CBD stent placement and jejunostomy tube placement. Patient now with intolerance of jejunal tube feeds and emesis with p.o. intake. EXAM: DG UGI W SINGLE CM TECHNIQUE: Single contrast examination was then performed using thin liquid barium. This exam was performed by Carlin Griffon, PA-C, and was supervised and interpreted by Harrietta Sherry, MD. FLUOROSCOPY: Radiation Exposure Index (as provided by the fluoroscopic device): 122.60 mGy Kerma COMPARISON:  None Available. FINDINGS: Esophagus:  Patulous esophagus. Esophageal motility: Severe dysmotility and delayed emptying into the gastric lumen, requiring table tilt at 25 degrees for passage of contrast past the LES. Gastroesophageal reflux: Spontaneous and frank reflux noted in prone position. Patient did vomit despite table tilt. Ingested 13mm barium tablet: Not given Stomach: Normal appearance. No hiatal hernia. Gastric emptying: Complete  absence of contrast past gastric pylorus after 20 minutes following administration of 100 cc of PO contrast. Patient ultimately had an episode of emesis. Duodenum:  Not opacified.  Unable to assess. Other: 50 cc of contrast administered via the jejunostomy tube. Normal opacification of mid to distal small bowel loops. Patient endorsed uncomfortable pressure upon contrast administration. Study was severely limited by patient's body habitus and severe dizziness with attempts to rotate. Study performed with 25 degrees table tilt. IMPRESSION: 1. Normal opacification of small bowel distal to jejunostomy tube. Patient endorsed sensation of discomfort and pressure. 2. Severe esophageal dysmotility and gastric stasis with gastroesophageal reflux and subsequent emesis of  administered oral contrast. Electronically Signed   By: Harrietta Sherry M.D.   On: 07/26/2024 16:29     Medications:    dextrose  50 mL/hr at 07/26/24 1700   feeding supplement (OSMOLITE 1.5 CAL) Stopped (07/24/24 1330)    amiodarone   200 mg Oral Daily   apixaban   5 mg Oral BID   calcium  carbonate  1 tablet Oral TID   Chlorhexidine  Gluconate Cloth  6 each Topical Q0600   copper   2 mg Oral Daily   feeding supplement (GLUCERNA SHAKE)  237 mL Oral TID BM   feeding supplement (PROSource TF20)  60 mL Per Tube Daily   folic acid   1 mg Per Tube Daily   free water   30 mL Per Tube Q2H   lidocaine   10 mL Intradermal Once   metoCLOPramide  (REGLAN ) injection  5 mg Intravenous Q8H   metoprolol  tartrate  25 mg Per Tube BID   multivitamin with minerals  1 tablet Per Tube BID   OLANZapine   10 mg Per Tube QHS   pantoprazole   40 mg Oral Daily   sodium chloride  flush  10-40 mL Intracatheter Q12H   thiamine   100 mg Per Tube Daily   albuterol , bisacodyl , diazepam , famotidine , meclizine , ondansetron  (ZOFRAN ) IV, oxyCODONE , senna-docusate, sodium chloride  flush  Assessment/ Plan:  Alyssa Leonard is a 68 y.o.  female with past medical history of  hypertension, GERD, OSA, obesity, pAfib, renal cell carcinoma, and malignant neoplasm of the pancreas, who was admitted to [facility] on 07/13/2024 for Dehydration [E86.0] Lack of appetite [R63.0] AKI (acute kidney injury) [N17.9]   Acute kidney injury on chronic kidney disease stage IIIb. Acute kidney injury appears prerenal due to persistent poor oral intake.Baseline creatinine 1.33 with GFR 44. Due to history of renal cell carcinoma and presence of pancreatic mass, palliative care consult would be appropriate to consider goals of care  Creatinine improved today with IV hydration. Would agree to continue. No acute indication for dialysis.   Lab Results  Component Value Date   CREATININE 2.02 (H) 07/27/2024   CREATININE 2.20 (H) 07/26/2024   CREATININE 2.32 (H) 07/25/2024    Intake/Output Summary (Last 24 hours) at 07/27/2024 1202 Last data filed at 07/27/2024 0927 Gross per 24 hour  Intake 1565.05 ml  Output --  Net 1565.05 ml   2. Acute metabolic acidosis, S bicarb 20 on admission. Likely secondary to kidney injury. S bicarb corrected  3. Anemia of chronic kidney disease Lab Results  Component Value Date   HGB 8.7 (L) 07/20/2024   Avoiding ESA's due to cancer history. Hgb stable today  4. Hypertension with chronic kidney disease. Prescribed Metoprolol  only.  Blood pressure acceptable for this patient   LOS: 11 Emidio Warrell 9/30/202512:02 PM

## 2024-07-28 ENCOUNTER — Ambulatory Visit: Admitting: Cardiology

## 2024-07-28 DIAGNOSIS — N179 Acute kidney failure, unspecified: Secondary | ICD-10-CM | POA: Diagnosis not present

## 2024-07-28 LAB — BASIC METABOLIC PANEL WITH GFR
Anion gap: 8 (ref 5–15)
BUN: 34 mg/dL — ABNORMAL HIGH (ref 8–23)
CO2: 25 mmol/L (ref 22–32)
Calcium: 8 mg/dL — ABNORMAL LOW (ref 8.9–10.3)
Chloride: 112 mmol/L — ABNORMAL HIGH (ref 98–111)
Creatinine, Ser: 2.06 mg/dL — ABNORMAL HIGH (ref 0.44–1.00)
GFR, Estimated: 26 mL/min — ABNORMAL LOW (ref >60)
Glucose, Bld: 183 mg/dL — ABNORMAL HIGH (ref 70–99)
Potassium: 3.7 mmol/L (ref 3.5–5.1)
Sodium: 145 mmol/L (ref 135–145)

## 2024-07-28 LAB — GLUCOSE, CAPILLARY
Glucose-Capillary: 141 mg/dL — ABNORMAL HIGH (ref 70–99)
Glucose-Capillary: 146 mg/dL — ABNORMAL HIGH (ref 70–99)
Glucose-Capillary: 164 mg/dL — ABNORMAL HIGH (ref 70–99)
Glucose-Capillary: 169 mg/dL — ABNORMAL HIGH (ref 70–99)
Glucose-Capillary: 176 mg/dL — ABNORMAL HIGH (ref 70–99)
Glucose-Capillary: 197 mg/dL — ABNORMAL HIGH (ref 70–99)

## 2024-07-28 LAB — MAGNESIUM: Magnesium: 2.6 mg/dL — ABNORMAL HIGH (ref 1.7–2.4)

## 2024-07-28 LAB — PHOSPHORUS: Phosphorus: 2 mg/dL — ABNORMAL LOW (ref 2.5–4.6)

## 2024-07-28 MED ORDER — PANTOPRAZOLE SODIUM 40 MG IV SOLR
40.0000 mg | INTRAVENOUS | Status: DC
Start: 2024-07-29 — End: 2024-08-03
  Administered 2024-07-29 – 2024-08-03 (×6): 40 mg via INTRAVENOUS
  Filled 2024-07-28 (×6): qty 10

## 2024-07-28 MED ORDER — FAMOTIDINE 20 MG PO TABS: 20.0000 mg | ORAL_TABLET | Freq: Two times a day (BID) | ORAL | Status: DC | PRN

## 2024-07-28 MED ORDER — AMIODARONE HCL 200 MG PO TABS
200.0000 mg | ORAL_TABLET | Freq: Every day | ORAL | Status: DC
  Administered 2024-07-29 – 2024-08-03 (×6): 200 mg
  Filled 2024-07-28 (×6): qty 1

## 2024-07-28 MED ORDER — COPPER 2 MG PO TABS
2.0000 mg | Freq: Every day | Status: DC
  Administered 2024-07-29 – 2024-07-31 (×3): 2 mg
  Filled 2024-07-28 (×3): qty 1

## 2024-07-28 MED ORDER — CALCIUM CARBONATE ANTACID 500 MG PO CHEW
1.0000 | CHEWABLE_TABLET | Freq: Three times a day (TID) | ORAL | Status: DC
  Administered 2024-07-28 – 2024-07-31 (×9): 200 mg
  Filled 2024-07-28 (×9): qty 1

## 2024-07-28 NOTE — Plan of Care (Signed)
  Problem: Skin Integrity: Goal: Risk for impaired skin integrity will decrease Outcome: Progressing   Problem: Safety: Goal: Ability to remain free from injury will improve Outcome: Progressing   Problem: Elimination: Goal: Will not experience complications related to bowel motility Outcome: Progressing   Problem: Activity: Goal: Risk for activity intolerance will decrease Outcome: Progressing

## 2024-07-28 NOTE — Progress Notes (Signed)
 Nutrition Follow-up  DOCUMENTATION CODES:   Morbid obesity  INTERVENTION:   -Continue MVI with minerals BID -Continue 500 mg calcium  carbonate TID -Continue regular diet -TF via j-tube:    Osmolite 1.5 @ 30 ml/hr and increase by 10 ml every 12 hours to goal rate of 50 ml/hr.    60 ml Prosource TF20 daily   30 ml free water  flush every 4 hours   Tube feeding regimen provides 1880 kcal (100% of needs), 95 grams of protein, and 914 ml of H2O. Total free water : 1094 ml daily    -Continue 100 mg thiamine  daily x 7 days -Monitor Mg, K, and Phos and replete as needed secondary to high refeeding risk -Pt with history of gastric sleeve with poor oral intake and significant weight loss. RD will draw labs to rule out potential micronutrient deficiencies: iron, folic acid , thiamine , copper , zinc , vitamin B-12, vitamin D , vitamin A, vitamin E, vitamin A, and calcium  -When transitioning to home, recommend transition to nocturnal feedings. Pt is not a candidate for bolus feedings secondary to j-tube:   Nutren 1.5 (or equivalent) @ 80 ml/hr x 16 hours   30 ml free water  flush every 4 hours   Tube feeding regimen provides 1920 kcals, 87 grams of protein, and 978 ml of H2O.  Total free water : 1158 ml free water  daily    -Continue 1 mg folic acid  daily -Continue 2 mg copper  daily x 60 days   NUTRITION DIAGNOSIS:   Inadequate oral intake related to cancer and cancer related treatments as evidenced by other (comment) (pt with duodenal mass requiring J-tube placement).  Ongoing  GOAL:   Patient will meet greater than or equal to 90% of their needs  Progressing   MONITOR:   PO intake, Labs, Weight trends, TF tolerance, I & O's, Skin  REASON FOR ASSESSMENT:   Consult Enteral/tube feeding initiation and management  ASSESSMENT:   68 y/o female with h/o HTN, GERD, hepatitis B, s/p open cholecystectomy (1075), morbid obesity s/p laparoscopic sleeve gastrectomy (2019), DM, PAF, OSA,  COPD, goiter, metastatic renal cell carcinoma on Ipilimumab (IPI) and Nivolumab (NIVO) immunotherapy at [facility], newly diagnosed pancreatic mass with biliary obstruction s/p IR drain placement 8/13 (replaced 8/26 & 9/2) and recent admission for sepsis and who is now admitted with FTT and dehydration now s/p robotic assisted laparoscopic feeding jejunostomy tube placement (40F) 9/25.  9/19- s/p Cholangiogram through biliary drain; common bile duct stent placement with covered metallic stent  9/25- s/p j-tube placement by surgery 9/26- TF held, x-ray confirmed j-tube in good position, TF resumed 9/27- TF held secondary to vomiting 9/29- contrast study revealed no evidence of obstruction from jejunostomy although no progression of PO contrast 9/30- NGT placed for decompression (tip of tube in stomach per KUB)  Reviewed I/O's: +1.1 L x 24 hours and +16 L since admission  UOP: 600 ml x 24 hours  Pt sitting up in bed at time of visit. Pt much more alert and interactive in comparison to previous visits. Pt reports feeling a lot better today; she denies any nausea or vomiting. She states the NGT feels uncomfortable, but understand needs for it. Per pt, she had a BM today.   Osmolite 1.5 currently infusing at 30 ml/hr.   Palliative care following for goals of care discussions.   Wt has been stable over the past week.   Medications reviewed and include calcium  carbonate, copper , folic acid , protonix , and thiamine .   Labs reviewed: CBGS: 101-176 (inpatient orders for  glycemic control are none).  Iron: 21, copper : 73, vitamin K: <10; zinc , vitamin B-12, vitamin D  WDL; vitamin A and vitamin E still pending.   Diet Order:   Diet Order             Diet NPO time specified  Diet effective now                   EDUCATION NEEDS:   No education needs have been identified at this time  Skin:  Skin Assessment: Skin Integrity Issues: Skin Integrity Issues:: Incisions Incisions: closed  abdomen  Last BM:  07/27/24 (type 6)  Height:   Ht Readings from Last 1 Encounters:  07/22/24 5' (1.524 m)    Weight:   Wt Readings from Last 1 Encounters:  07/28/24 113.9 kg    Ideal Body Weight:  45.5 kg  BMI:  Body mass index is 49.04 kg/m.  Estimated Nutritional Needs:   Kcal:  2000-2300kcal/day  Protein:  100-115g/day  Fluid:  1.6-1.8L/day    Margery ORN, RD, LDN, CDCES Registered Dietitian III Certified Diabetes Care and Education Specialist If unable to reach this RD, please use RD Inpatient group chat on secure chat between hours of 8am-4 pm daily

## 2024-07-28 NOTE — Plan of Care (Signed)
  Problem: Metabolic: Goal: Ability to maintain appropriate glucose levels will improve Outcome: Progressing   Problem: Education: Goal: Knowledge of General Education information will improve Description: Including pain rating scale, medication(s)/side effects and non-pharmacologic comfort measures Outcome: Progressing   Problem: Clinical Measurements: Goal: Ability to maintain clinical measurements within normal limits will improve Outcome: Progressing   Problem: Activity: Goal: Risk for activity intolerance will decrease Outcome: Progressing   Problem: Nutrition: Goal: Adequate nutrition will be maintained Outcome: Progressing   Problem: Pain Managment: Goal: General experience of comfort will improve and/or be controlled Outcome: Progressing   Problem: Safety: Goal: Ability to remain free from injury will improve Outcome: Progressing   Problem: Skin Integrity: Goal: Risk for impaired skin integrity will decrease Outcome: Progressing

## 2024-07-28 NOTE — Progress Notes (Signed)
 PROGRESS NOTE    Alyssa Leonard  FMW:[id number] DOB: 12-23-1955 DOA: 07/13/2024 PCP: [name] LABOR, MD  Chief Complaint  Patient presents with   abnormal labs    Hospital Course:  Alyssa Leonard 68 year old female with history of renal cell carcinoma, obstructive jaundice secondary to malignant neoplasm of the pancreas, paroxysmal A-fib on Eliquis , hypertension, OSA, GERD, morbid obesity, who presents to the ED with AKI.  Patient reports that she has had poor p.o. intake since cancer diagnosis.  She also endorses recurrent nausea.  She has recently met with oncology and palliative care and is planning for biopsy of the pancreatic mass.  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  Expanding metallic biliary stent was placed with improved biliary patency, external biliary stent was removed.  Stay is further complicated by ongoing AKI.  Ultimately patient elected for J-tube placement on 9/25.  Postoperative course has been complicated by large-volume vomiting.  Upper GI 9/29 showed contrast beyond jejunostomy but she continues to have trouble with p.o.  NG tube was placed while continuing NG tube feeds  Subjective: No acute events overnight. On evaluation today patient is irritated by NG tube.  No acute complaints otherwise   Objective: Vitals:   07/27/24 2017 07/27/24 2026 07/28/24 0303 07/28/24 0500  BP: 133/79  121/80   Pulse: 86 89 85   Resp: 19  19   Temp: 97.6 F (36.4 C)  97.9 F (36.6 C)   TempSrc:   Oral   SpO2: (!) 79% 99% 100%   Weight:    113.9 kg  Height:        Intake/Output Summary (Last 24 hours) at 07/28/2024 1519 Last data filed at 07/28/2024 1135 Gross per 24 hour  Intake 1543.37 ml  Output 600 ml  Net 943.37 ml   Filed Weights   07/26/24 0500 07/27/24 0500 07/28/24 0500  Weight: 113.6 kg 113.8 kg 113.9 kg    Examination: General exam: Appears calm and  comfortable, NAD  Respiratory system: No work of breathing, symmetric chest wall expansion Cardiovascular system: S1 & S2 heard, RRR.  Gastrointestinal system: Abdomen is nondistended, soft. NG in place. Neuro: Alert and oriented  Assessment & Plan:  Principal Problem:   AKI (acute kidney injury) Active Problems:   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)   Mass of head of pancreas   COPD (chronic obstructive pulmonary disease) (HCC)   OSA (obstructive sleep apnea)   Paroxysmal atrial flutter (HCC)   Hypertension   Diabetes mellitus without complication (HCC)   GERD (gastroesophageal reflux disease)   Neoplasm related pain   Palliative care encounter   Dehydration    AKI on CKD 3B - Multifactorial.  Prerenal secondary to poor p.o. intake, complicated by massive renal mass - Creatinine 0.7-1.3.  GFR 44. - Nephrology consulted - Not a candidate for renal replacement therapy - Continue hydration via J-tub - Creatinine has improved some but appears to be plateauing   Nausea Abdominal fullness Poor oral intake - Secondary to pancreatic mass and large renal mass - Still on scheduled Reglan  - History of Roux-en-Y procedure, large pancreatic mass, renal mass, not a candidate for PEG tube placement. --J tube placed on 07/22/24 - Continue n.p.o. while J-tube feeding is titrated.  NG tube in place.  Vomiting -Large-volume vomiting and once tube feeding was initiated - 9/29 upper GI with contrast beyond jejunostomy - NG tube placed for low intermittent suction  to keep stomach emptying while tube feeds are titrated - Continue tube feeds slowly    Hypernatremia  -Free water  deficit - Resolved now. - Continue to monitor   Malignant pancreatic mass - Presumed primary cause of all of the above - Patient previously seen [facility] oncology.  Prior biopsy revealed IPMN and patient was being monitored .  Appears she has now developed pancreatic cancer, but was still pending  biopsy via EUS at time of admission. - CT scan from 8/12 reviewed: Right renal mass 11 x 9 x 9 cm, abutting the adjacent duodenum.  Mass of pancreatic head 3 x 5 cm. - Patient has recently established with oncology.  She was planning for outpatient biopsy but unfortunately with her decline in appetite, worsening kidney function, and poor functional status she would not be a candidate for any cancer therapies at this time.  She is not a surgical candidate.  Please see oncology notes from 9/22 for more detail - Patient was initially considering discharge home with hospice but has now opted for J-tube placement with hopes that this will increase her functional status. - CA 19-9 has resulted and is very elevated. - Palliative care consulted - 9/19: Cholangiogram through biliary drain, bile duct stent placement with covered metallic stent. --pt asked for code status to be changed to Full Code on 9/24 --pt asked for code status to be changed back to DNR-limited on 9/25.   --Onc palliative care following, pt is not ready for hospice.   Stage III metastatic renal cell carcinoma - Very large renal mass causing compression and upward displacement. - Previously on immunotherapy - Oncology follow-up as above --Onc palliative care following, pt is not ready for hospice.   Paroxysmal A-fib --cont Eliquis    OSA - Continue CPAP at night   COPD, no acute exacerbation   Morbid obesity, BMI 43.92   Hx of DM, not currently active --d/c'ed BG checks and SSI  DVT prophylaxis: Eliquis    Code Status: Limited: Do not attempt resuscitation (DNR) -DNR-LIMITED -Do Not Intubate/DNI  Disposition:  Inpatient pending NGT removal and J tube feeding tolerance  Consultants:  Treatment Team:  Consulting Physician: Melanee Annah BROCKS, MD  Procedures:    Antimicrobials:  Anti-infectives (From admission, onward)    Start     Dose/Rate Route Frequency Ordered Stop   07/22/24 2200  cefoTEtan  (CEFOTAN ) 2 g in sodium  chloride 0.9 % 100 mL IVPB        2 g 200 mL/hr over 30 Minutes Intravenous Every 12 hours 07/22/24 1143 07/23/24 1218   07/22/24 0600  cefoTEtan  (CEFOTAN ) 2 g in sodium chloride  0.9 % 100 mL IVPB        2 g 200 mL/hr over 30 Minutes Intravenous On call to O.R. 07/21/24 0802 07/22/24 0756   07/16/24 1515  cefOXitin  (MEFOXIN ) 2 g in sodium chloride  0.9 % 100 mL IVPB        2 g 200 mL/hr over 30 Minutes Intravenous  Once 07/16/24 1422 07/16/24 1513       Data Reviewed: I have personally reviewed following labs and imaging studies CBC: No results for input(s): WBC, NEUTROABS, HGB, HCT, MCV, PLT in the last 168 hours. Basic Metabolic Panel: Recent Labs  Lab 07/22/24 1343 07/23/24 0341 07/24/24 0300 07/25/24 0617 07/26/24 0344 07/27/24 0433 07/28/24 0541  NA  --   --   --  150* 145 146* 145  K  --   --   --  3.6 4.3 3.6 3.7  CL  --   --   --  116* 117* 114* 112*  CO2  --   --   --  23 23 24 25   GLUCOSE  --   --   --  110* 117* 118* 183*  BUN  --   --   --  45* 43* 39* 34*  CREATININE  --  2.39*  --  2.32* 2.20* 2.02* 2.06*  CALCIUM  8.1*  --   --  8.5* 8.2* 8.2* 8.0*  MG 1.7 1.9 1.9 2.4  --   --  2.6*  PHOS 3.3 3.2 2.9 2.4*  --   --  2.0*   GFR: Estimated Creatinine Clearance: 30.1 mL/min (A) (by C-G formula based on SCr of 2.06 mg/dL (H)). Liver Function Tests: No results for input(s): AST, ALT, ALKPHOS, BILITOT, PROT, ALBUMIN in the last 168 hours. CBG: Recent Labs  Lab 07/27/24 2332 07/27/24 2357 07/28/24 0301 07/28/24 0815 07/28/24 1147  GLUCAP 135* 133* 141* 169* 176*    No results found for this or any previous visit (from the past 240 hours).   Radiology Studies: DG Abd 1 View Result Date: 07/27/2024 CLINICAL DATA:  NG tube placement. EXAM: ABDOMEN - 1 VIEW COMPARISON:  Chest x-ray 07/27/2024 FINDINGS: Nasogastric tube is present with tip over the stomach in the left upper quadrant. Multiple surgical clips over the left mid to upper  abdomen. Stent over the midline of the mid abdomen. Bowel gas pattern is nonobstructive. Remainder the exam is unchanged. IMPRESSION: 1. Nonobstructive bowel gas pattern. 2. Nasogastric tube with tip over the stomach in the left upper quadrant. Electronically Signed   By: Toribio Agreste M.D.   On: 07/27/2024 17:31   DG ABD ACUTE 2+V W 1V CHEST Result Date: 07/27/2024 EXAM: UPRIGHT AND SUPINE XRAY VIEWS OF THE ABDOMEN AND 4 VIEW(S) OF THE CHEST 07/27/2024 08:00:00 AM COMPARISON: 07/23/2024 CLINICAL HISTORY: Ileus following gastrointestinal surgery FINDINGS: LUNGS AND PLEURA: Low lung volumes. No consolidation or pulmonary edema. No pleural effusion or pneumothorax. HEART AND MEDIASTINUM: No acute abnormality of the cardiac and mediastinal silhouettes. BOWEL: Nonobstructive bowel gas pattern. Contrast throughout the colon. PERITONEUM AND SOFT TISSUES: Biliary stent is present. Multiple surgical clips in the left upper quadrant. Vascular stent is present. No abnormal calcifications. No free air. BONES: No acute osseous abnormality. IMPRESSION: 1. No radiographic bowel obstruction or free air. 2. Low lung volumes. Electronically signed by: Donnice Mania MD 07/27/2024 11:09 AM EDT RP Workstation: [id number]    Scheduled Meds:  [START ON 07/29/2024] amiodarone   200 mg Per Tube Daily   apixaban   5 mg Per Tube BID   calcium  carbonate  1 tablet Per Tube TID   Chlorhexidine  Gluconate Cloth  6 each Topical Q0600   [START ON 07/29/2024] copper   2 mg Per Tube Daily   folic acid   1 mg Per Tube Daily   free water   100 mL Per Tube Q4H   lidocaine   10 mL Intradermal Once   metoCLOPramide  (REGLAN ) injection  5 mg Intravenous Q8H   metoprolol  tartrate  25 mg Per Tube BID   multivitamin with minerals  1 tablet Per Tube BID   OLANZapine   10 mg Per Tube QHS   [START ON 07/29/2024] pantoprazole  (PROTONIX ) IV  40 mg Intravenous Q24H   sodium chloride  flush  10-40 mL Intracatheter Q12H   thiamine   100 mg Per Tube Daily    Continuous Infusions:  feeding supplement (OSMOLITE 1.5 CAL) 1,000 mL (07/27/24 1851)  LOS: 12 days  MDM: Patient is high risk for one or more organ failure.  They necessitate ongoing hospitalization for continued IV therapies and subsequent lab monitoring. Total time spent interpreting labs and vitals, reviewing the medical record, coordinating care amongst consultants and care team members, directly assessing and discussing care with the patient and/or family: 55 min Adaysha Dubinsky, DO [facility]  To contact the attending physician between 7A-7P please use Epic Chat. To contact the covering physician during after hours 7P-7A, please review Amion.  07/28/2024, 3:19 PM   *This document has been created with the assistance of dictation software. Please excuse typographical errors. *

## 2024-07-28 NOTE — Progress Notes (Signed)
 Mobility Specialist - Progress Note   07/28/24 1507  Mobility  Activity Dangled on edge of bed;Stood at bedside  Level of Assistance Contact guard assist, steadying assist  Assistive Device Front wheel walker  Range of Motion/Exercises Active;Right leg;Left leg  Activity Response Tolerated well  Mobility visit 1 Mobility  Mobility Specialist Start Time (ACUTE ONLY) 1420  Mobility Specialist Stop Time (ACUTE ONLY) 1441  Mobility Specialist Time Calculation (min) (ACUTE ONLY) 21 min   Pt semi fowler upon entry, utilizing RA. Pt completed bed mob MinA to bring BLE EOB, HHA to bring trunk from sup to sit. Pt STS to RW from elevated bed height  x2 MinA-CGA--- Pt completed 4 standing marches during second STS. Pt returned supine (+2 required d/t fatigue), left with alarm set and needs within reach.  America Silvan Mobility Specialist 07/28/24 3:13 PM

## 2024-07-28 NOTE — Progress Notes (Signed)
 [facility]  ROUNDING NOTE   Subjective:   Patient seen resting in bed NG tube in place Denies current nausea. Tube feeds infusing  Creatinine 2.06  Objective:  Vital signs in last 24 hours:  Temp:  [97.6 F (36.4 C)-98.1 F (36.7 C)] 97.9 F (36.6 C) (10/01 0303) Pulse Rate:  [78-89] 85 (10/01 0303) Resp:  [18-19] 19 (10/01 0303) BP: (121-149)/(79-92) 121/80 (10/01 0303) SpO2:  [79 %-100 %] 100 % (10/01 0303) Weight:  [113.9 kg] 113.9 kg (10/01 0500)  Weight change: 0.1 kg Filed Weights   07/26/24 0500 07/27/24 0500 07/28/24 0500  Weight: 113.6 kg 113.8 kg 113.9 kg    Intake/Output: I/O last 3 completed shifts: In: 2729.8 [P.O.:460; I.V.:1289.8; Other:90; NG/GT:890] Out: 600 [Emesis/NG output:600]   Intake/Output this shift:  Total I/O In: 200 [NG/GT:200] Out: -   Physical Exam: General: NAD  Head: Normocephalic, atraumatic. Moist oral mucosal membranes  Eyes: Anicteric  Lungs:  Clear to auscultation, normal effort  Heart: Regular rate and rhythm  Abdomen:  Soft, nontender, Jtube  Extremities: No peripheral edema.  Neurologic: Awake and alert  Skin: Warm,dry, no rash   NG tube    Basic Metabolic Panel: Recent Labs  Lab 07/22/24 1343 07/23/24 0341 07/24/24 0300 07/25/24 0617 07/26/24 0344 07/27/24 0433 07/28/24 0541  NA  --   --   --  150* 145 146* 145  K  --   --   --  3.6 4.3 3.6 3.7  CL  --   --   --  116* 117* 114* 112*  CO2  --   --   --  23 23 24 25   GLUCOSE  --   --   --  110* 117* 118* 183*  BUN  --   --   --  45* 43* 39* 34*  CREATININE  --  2.39*  --  2.32* 2.20* 2.02* 2.06*  CALCIUM  8.1*  --   --  8.5* 8.2* 8.2* 8.0*  MG 1.7 1.9 1.9 2.4  --   --  2.6*  PHOS 3.3 3.2 2.9 2.4*  --   --  2.0*    Liver Function Tests: No results for input(s): AST, ALT, ALKPHOS, BILITOT, PROT, ALBUMIN in the last 168 hours.  No results for input(s): LIPASE, AMYLASE in the last 168 hours. No results for input(s):  AMMONIA in the last 168 hours.  CBC: No results for input(s): WBC, NEUTROABS, HGB, HCT, MCV, PLT in the last 168 hours.   Cardiac Enzymes: No results for input(s): CKTOTAL, CKMB, CKMBINDEX, TROPONINI in the last 168 hours.  BNP: Invalid input(s): POCBNP  CBG: Recent Labs  Lab 07/27/24 2332 07/27/24 2357 07/28/24 0301 07/28/24 0815 07/28/24 1147  GLUCAP 135* 133* 141* 169* 176*    Microbiology: Results for orders placed or performed during the hospital encounter of 06/29/24  Culture, blood (x 2)     Status: None   Collection Time: 06/29/24  4:39 PM   Specimen: BLOOD  Result Value Ref Range Status   Specimen Description BLOOD LEFT ANTECUBITAL  Final   Special Requests   Final    BOTTLES DRAWN AEROBIC AND ANAEROBIC Blood Culture adequate volume   Culture   Final    NO GROWTH 5 DAYS Performed at [facility], [address] [place], [place] [address]    Report Status 07/04/2024 FINAL  Final  Culture, blood (Routine X 2) w Reflex to ID Panel     Status: None   Collection Time:  06/29/24  7:02 PM   Specimen: BLOOD  Result Value Ref Range Status   Specimen Description BLOOD BLOOD LEFT HAND  Final   Special Requests   Final    BOTTLES DRAWN AEROBIC ONLY Blood Culture results may not be optimal due to an inadequate volume of blood received in culture bottles   Culture   Final    NO GROWTH 5 DAYS Performed at [facility], [address] [place], [place] [address]    Report Status 07/04/2024 FINAL  Final    Coagulation Studies: No results for input(s): LABPROT, INR in the last 72 hours.  Urinalysis: No results for input(s): COLORURINE, LABSPEC, PHURINE, GLUCOSEU, HGBUR, BILIRUBINUR, KETONESUR, PROTEINUR, UROBILINOGEN, NITRITE, LEUKOCYTESUR in the last 72 hours.  Invalid input(s): APPERANCEUR    Imaging: DG Abd 1 View Result Date: 07/27/2024 CLINICAL DATA:  NG tube placement. EXAM: ABDOMEN - 1  VIEW COMPARISON:  Chest x-ray 07/27/2024 FINDINGS: Nasogastric tube is present with tip over the stomach in the left upper quadrant. Multiple surgical clips over the left mid to upper abdomen. Stent over the midline of the mid abdomen. Bowel gas pattern is nonobstructive. Remainder the exam is unchanged. IMPRESSION: 1. Nonobstructive bowel gas pattern. 2. Nasogastric tube with tip over the stomach in the left upper quadrant. Electronically Signed   By: Toribio Agreste M.D.   On: 07/27/2024 17:31   DG ABD ACUTE 2+V W 1V CHEST Result Date: 07/27/2024 EXAM: UPRIGHT AND SUPINE XRAY VIEWS OF THE ABDOMEN AND 4 VIEW(S) OF THE CHEST 07/27/2024 08:00:00 AM COMPARISON: 07/23/2024 CLINICAL HISTORY: Ileus following gastrointestinal surgery FINDINGS: LUNGS AND PLEURA: Low lung volumes. No consolidation or pulmonary edema. No pleural effusion or pneumothorax. HEART AND MEDIASTINUM: No acute abnormality of the cardiac and mediastinal silhouettes. BOWEL: Nonobstructive bowel gas pattern. Contrast throughout the colon. PERITONEUM AND SOFT TISSUES: Biliary stent is present. Multiple surgical clips in the left upper quadrant. Vascular stent is present. No abnormal calcifications. No free air. BONES: No acute osseous abnormality. IMPRESSION: 1. No radiographic bowel obstruction or free air. 2. Low lung volumes. Electronically signed by: Donnice Mania MD 07/27/2024 11:09 AM EDT RP Workstation: [id number]     Medications:    feeding supplement (OSMOLITE 1.5 CAL) 1,000 mL (07/27/24 1851)    [START ON 07/29/2024] amiodarone   200 mg Per Tube Daily   apixaban   5 mg Per Tube BID   calcium  carbonate  1 tablet Per Tube TID   Chlorhexidine  Gluconate Cloth  6 each Topical Q0600   [START ON 07/29/2024] copper   2 mg Per Tube Daily   folic acid   1 mg Per Tube Daily   free water   100 mL Per Tube Q4H   lidocaine   10 mL Intradermal Once   metoCLOPramide  (REGLAN ) injection  5 mg Intravenous Q8H   metoprolol  tartrate  25 mg Per Tube BID    multivitamin with minerals  1 tablet Per Tube BID   OLANZapine   10 mg Per Tube QHS   [START ON 07/29/2024] pantoprazole  (PROTONIX ) IV  40 mg Intravenous Q24H   sodium chloride  flush  10-40 mL Intracatheter Q12H   thiamine   100 mg Per Tube Daily   albuterol , diazepam , famotidine , meclizine , ondansetron  (ZOFRAN ) IV, oxyCODONE , senna-docusate, sodium chloride  flush  Assessment/ Plan:  Ms. TENASIA AULL is a 68 y.o.  female with past medical history of hypertension, GERD, OSA, obesity, pAfib, renal cell carcinoma, and malignant neoplasm of the pancreas, who was admitted to [facility] on 07/13/2024 for Dehydration [E86.0] Lack of  appetite [R63.0] AKI (acute kidney injury) [N17.9]   Acute kidney injury on chronic kidney disease stage IIIb. Acute kidney injury appears prerenal due to persistent poor oral intake.Baseline creatinine 1.33 with GFR 44. Due to history of renal cell carcinoma and presence of pancreatic mass, palliative care consult would be appropriate to consider goals of care  Creatinine is stabilized today.  Tube feeds infusing at 30 mL/h.  NG tube in place with aspirate noted  Lab Results  Component Value Date   CREATININE 2.06 (H) 07/28/2024   CREATININE 2.02 (H) 07/27/2024   CREATININE 2.20 (H) 07/26/2024    Intake/Output Summary (Last 24 hours) at 07/28/2024 1326 Last data filed at 07/28/2024 1135 Gross per 24 hour  Intake 1573.37 ml  Output 600 ml  Net 973.37 ml   2. Acute metabolic acidosis, S bicarb 20 on admission. Likely secondary to kidney injury. S bicarb corrected  3. Anemia of chronic kidney disease Lab Results  Component Value Date   HGB 8.7 (L) 07/20/2024  Hemoglobin remains stable. Avoiding ESA's due to cancer history.   4. Hypertension with chronic kidney disease. Prescribed Metoprolol  only.  Blood pressure remains stable   LOS: 12 Ameli Sangiovanni 10/1/20251:26 PM

## 2024-07-29 ENCOUNTER — Inpatient Hospital Stay: Admitting: Hospice and Palliative Medicine

## 2024-07-29 DIAGNOSIS — N179 Acute kidney failure, unspecified: Secondary | ICD-10-CM | POA: Diagnosis not present

## 2024-07-29 LAB — BASIC METABOLIC PANEL WITH GFR
Anion gap: 6 (ref 5–15)
BUN: 36 mg/dL — ABNORMAL HIGH (ref 8–23)
CO2: 24 mmol/L (ref 22–32)
Calcium: 7.8 mg/dL — ABNORMAL LOW (ref 8.9–10.3)
Chloride: 111 mmol/L (ref 98–111)
Creatinine, Ser: 2.07 mg/dL — ABNORMAL HIGH (ref 0.44–1.00)
GFR, Estimated: 26 mL/min — ABNORMAL LOW (ref >60)
Glucose, Bld: 191 mg/dL — ABNORMAL HIGH (ref 70–99)
Potassium: 3.8 mmol/L (ref 3.5–5.1)
Sodium: 141 mmol/L (ref 135–145)

## 2024-07-29 LAB — GLUCOSE, CAPILLARY
Glucose-Capillary: 174 mg/dL — ABNORMAL HIGH (ref 70–99)
Glucose-Capillary: 191 mg/dL — ABNORMAL HIGH (ref 70–99)
Glucose-Capillary: 180 mg/dL — ABNORMAL HIGH (ref 70–99)
Glucose-Capillary: 170 mg/dL — ABNORMAL HIGH (ref 70–99)
Glucose-Capillary: 167 mg/dL — ABNORMAL HIGH (ref 70–99)

## 2024-07-29 LAB — PHOSPHORUS
Phosphorus: 1.4 mg/dL — ABNORMAL LOW (ref 2.5–4.6)
Phosphorus: 1.8 mg/dL — ABNORMAL LOW (ref 2.5–4.6)

## 2024-07-29 LAB — MAGNESIUM: Magnesium: 2.4 mg/dL (ref 1.7–2.4)

## 2024-07-29 LAB — VITAMIN A: Vitamin A (Retinoic Acid): 2.5 ug/dL — ABNORMAL LOW (ref 22.0–69.5)

## 2024-07-29 LAB — VITAMIN E
Vitamin E (Alpha Tocopherol): 8 mg/L — ABNORMAL LOW (ref 9.0–29.0)
Vitamin E(Gamma Tocopherol): 0.7 mg/L (ref 0.5–4.9)

## 2024-07-29 MED ORDER — INSULIN ASPART 100 UNIT/ML IJ SOLN
0.0000 [IU] | Freq: Three times a day (TID) | INTRAMUSCULAR | Status: DC
  Administered 2024-07-30 – 2024-08-02 (×7): 1 [IU] via SUBCUTANEOUS
  Filled 2024-07-29 (×7): qty 1

## 2024-07-29 MED ORDER — POTASSIUM PHOSPHATES 15 MMOLE/5ML IV SOLN
45.0000 mmol | Freq: Once | INTRAVENOUS | Status: AC
  Administered 2024-07-29: 45 mmol via INTRAVENOUS
  Filled 2024-07-29: qty 15

## 2024-07-29 MED ORDER — INSULIN ASPART 100 UNIT/ML IJ SOLN
3.0000 [IU] | Freq: Once | INTRAMUSCULAR | Status: AC
  Administered 2024-07-29: 3 [IU] via SUBCUTANEOUS
  Filled 2024-07-29: qty 1

## 2024-07-29 MED ORDER — VITAMIN A 3 MG (10000 UNIT) PO CAPS
10000.0000 [IU] | ORAL_CAPSULE | Freq: Every day | ORAL | Status: DC
  Administered 2024-07-30 – 2024-08-03 (×5): 10000 [IU] via ORAL
  Filled 2024-07-29 (×6): qty 1

## 2024-07-29 MED ORDER — INSULIN ASPART 100 UNIT/ML IJ SOLN
2.0000 [IU] | Freq: Once | INTRAMUSCULAR | Status: AC
  Administered 2024-07-29: 2 [IU] via SUBCUTANEOUS
  Filled 2024-07-29: qty 1

## 2024-07-29 MED ORDER — VITAMIN E 45 MG (100 UNIT) PO CAPS
400.0000 [IU] | ORAL_CAPSULE | Freq: Every day | ORAL | Status: DC
  Administered 2024-07-30 – 2024-07-31 (×2): 400 [IU] via ORAL
  Filled 2024-07-29 (×3): qty 4

## 2024-07-29 NOTE — Progress Notes (Signed)
 [facility]  ROUNDING NOTE   Subjective:   Denies nausea or vomiting Alert  Tube feeds at 31ml/hr Creatinine 2.07  Objective:  Vital signs in last 24 hours:  Temp:  [97.7 F (36.5 C)-98.7 F (37.1 C)] 98.7 F (37.1 C) (10/02 0812) Pulse Rate:  [87-94] 94 (10/02 0812) Resp:  [16-18] 16 (10/02 0812) BP: (121-135)/(68-87) 125/68 (10/02 0812) SpO2:  [98 %-100 %] 98 % (10/02 0812) Weight:  [887 kg] 112 kg (10/02 0401)  Weight change: -1.861 kg Filed Weights   07/27/24 0500 07/28/24 0500 07/29/24 0401  Weight: 113.8 kg 113.9 kg 112 kg    Intake/Output: I/O last 3 completed shifts: In: 3230.4 [Other:290; NG/GT:2940.4] Out: 250 [Emesis/NG output:250]   Intake/Output this shift:  Total I/O In: 200 [NG/GT:200] Out: -   Physical Exam: General: NAD  Head: Normocephalic, atraumatic. Moist oral mucosal membranes  Eyes: Anicteric  Lungs:  Clear to auscultation, normal effort  Heart: Regular rate and rhythm  Abdomen:  Soft, nontender, Jtube  Extremities: No peripheral edema.  Neurologic: Awake and alert  Skin: Warm,dry, no rash   NG tube    Basic Metabolic Panel: Recent Labs  Lab 07/23/24 0341 07/24/24 0300 07/25/24 0617 07/26/24 0344 07/27/24 0433 07/28/24 0541 07/29/24 0238  NA  --   --  150* 145 146* 145 141  K  --   --  3.6 4.3 3.6 3.7 3.8  CL  --   --  116* 117* 114* 112* 111  CO2  --   --  23 23 24 25 24   GLUCOSE  --   --  110* 117* 118* 183* 191*  BUN  --   --  45* 43* 39* 34* 36*  CREATININE 2.39*  --  2.32* 2.20* 2.02* 2.06* 2.07*  CALCIUM   --   --  8.5* 8.2* 8.2* 8.0* 7.8*  MG 1.9 1.9 2.4  --   --  2.6* 2.4  PHOS 3.2 2.9 2.4*  --   --  2.0* 1.4*    Liver Function Tests: No results for input(s): AST, ALT, ALKPHOS, BILITOT, PROT, ALBUMIN in the last 168 hours.  No results for input(s): LIPASE, AMYLASE in the last 168 hours. No results for input(s): AMMONIA in the last 168 hours.  CBC: No results for input(s):  WBC, NEUTROABS, HGB, HCT, MCV, PLT in the last 168 hours.   Cardiac Enzymes: No results for input(s): CKTOTAL, CKMB, CKMBINDEX, TROPONINI in the last 168 hours.  BNP: Invalid input(s): POCBNP  CBG: Recent Labs  Lab 07/28/24 2124 07/28/24 2348 07/29/24 0355 07/29/24 0814 07/29/24 1206  GLUCAP 164* 146* 174* 191* 180*    Microbiology: Results for orders placed or performed during the hospital encounter of 06/29/24  Culture, blood (x 2)     Status: None   Collection Time: 06/29/24  4:39 PM   Specimen: BLOOD  Result Value Ref Range Status   Specimen Description BLOOD LEFT ANTECUBITAL  Final   Special Requests   Final    BOTTLES DRAWN AEROBIC AND ANAEROBIC Blood Culture adequate volume   Culture   Final    NO GROWTH 5 DAYS Performed at [facility], [address] [place], [place] [address]    Report Status 07/04/2024 FINAL  Final  Culture, blood (Routine X 2) w Reflex to ID Panel     Status: None   Collection Time: 06/29/24  7:02 PM   Specimen: BLOOD  Result Value Ref Range Status   Specimen Description BLOOD BLOOD LEFT HAND  Final   Special Requests   Final    BOTTLES DRAWN AEROBIC ONLY Blood Culture results may not be optimal due to an inadequate volume of blood received in culture bottles   Culture   Final    NO GROWTH 5 DAYS Performed at [facility], [address] [place], [place] [address]    Report Status 07/04/2024 FINAL  Final    Coagulation Studies: No results for input(s): LABPROT, INR in the last 72 hours.  Urinalysis: No results for input(s): COLORURINE, LABSPEC, PHURINE, GLUCOSEU, HGBUR, BILIRUBINUR, KETONESUR, PROTEINUR, UROBILINOGEN, NITRITE, LEUKOCYTESUR in the last 72 hours.  Invalid input(s): APPERANCEUR    Imaging: DG Abd 1 View Result Date: 07/27/2024 CLINICAL DATA:  NG tube placement. EXAM: ABDOMEN - 1 VIEW COMPARISON:  Chest x-ray 07/27/2024 FINDINGS: Nasogastric tube  is present with tip over the stomach in the left upper quadrant. Multiple surgical clips over the left mid to upper abdomen. Stent over the midline of the mid abdomen. Bowel gas pattern is nonobstructive. Remainder the exam is unchanged. IMPRESSION: 1. Nonobstructive bowel gas pattern. 2. Nasogastric tube with tip over the stomach in the left upper quadrant. Electronically Signed   By: Toribio Agreste M.D.   On: 07/27/2024 17:31     Medications:    feeding supplement (OSMOLITE 1.5 CAL) 60 mL/hr at 07/29/24 0415    amiodarone   200 mg Per Tube Daily   apixaban   5 mg Per Tube BID   calcium  carbonate  1 tablet Per Tube TID   Chlorhexidine  Gluconate Cloth  6 each Topical Q0600   copper   2 mg Per Tube Daily   folic acid   1 mg Per Tube Daily   free water   100 mL Per Tube Q4H   lidocaine   10 mL Intradermal Once   metoCLOPramide  (REGLAN ) injection  5 mg Intravenous Q8H   metoprolol  tartrate  25 mg Per Tube BID   multivitamin with minerals  1 tablet Per Tube BID   OLANZapine   10 mg Per Tube QHS   pantoprazole  (PROTONIX ) IV  40 mg Intravenous Q24H   sodium chloride  flush  10-40 mL Intracatheter Q12H   thiamine   100 mg Per Tube Daily   albuterol , diazepam , famotidine , meclizine , ondansetron  (ZOFRAN ) IV, oxyCODONE , senna-docusate, sodium chloride  flush  Assessment/ Plan:  Alyssa Leonard is a 68 y.o.  female with past medical history of hypertension, GERD, OSA, obesity, pAfib, renal cell carcinoma, and malignant neoplasm of the pancreas, who was admitted to [facility] on 07/13/2024 for Dehydration [E86.0] Lack of appetite [R63.0] AKI (acute kidney injury) [N17.9]   Acute kidney injury on chronic kidney disease stage IIIb. Acute kidney injury appears prerenal due to persistent poor oral intake.Baseline creatinine 1.33 with GFR 44. Due to history of renal cell carcinoma and presence of pancreatic mass, palliative care consult would be appropriate to consider goals of care  Creatinine remains stable.  Tolerating tube feeds. Will continue to monitor.  Lab Results  Component Value Date   CREATININE 2.07 (H) 07/29/2024   CREATININE 2.06 (H) 07/28/2024   CREATININE 2.02 (H) 07/27/2024    Intake/Output Summary (Last 24 hours) at 07/29/2024 1320 Last data filed at 07/29/2024 1130 Gross per 24 hour  Intake 2580.42 ml  Output --  Net 2580.42 ml   2. Acute metabolic acidosis, S bicarb 20 on admission. Likely secondary to kidney injury. S bicarb corrected  3. Anemia of chronic kidney disease Lab Results  Component Value Date   HGB 8.7 (L) 07/20/2024  Avoiding ESA's due to cancer history.   4. Hypertension with chronic kidney disease. Well controlled with metoprolol .    LOS: 13 Alyssa Leonard 10/2/20251:20 PM

## 2024-07-29 NOTE — Progress Notes (Signed)
 Nutrition Follow-up  DOCUMENTATION CODES:   Morbid obesity  INTERVENTION:   -Continue MVI with minerals BID -Continue 500 mg calcium  carbonate TID -Continue regular diet -TF via j-tube:    Osmolite 1.5 @ 50 ml/hr    60 ml Prosource TF20 daily   30 ml free water  flush every 4 hours   Tube feeding regimen provides 1880 kcal (100% of needs), 95 grams of protein, and 914 ml of H2O. Total free water : 1094 ml daily    -Continue 100 mg thiamine  daily x 7 days -Monitor Mg, K, and Phos and replete as needed secondary to high refeeding risk -Pt with history of gastric sleeve with poor oral intake and significant weight loss. RD will draw labs to rule out potential micronutrient deficiencies: iron, folic acid , thiamine , copper , zinc , vitamin B-12, vitamin D , vitamin A, vitamin E, vitamin A, and calcium  -When transitioning to home, recommend transition to nocturnal feedings. Pt is not a candidate for bolus feedings secondary to j-tube:   Nutren 1.5 (or equivalent) @ 80 ml/hr x 16 hours   30 ml free water  flush every 4 hours   Tube feeding regimen provides 1920 kcals, 87 grams of protein, and 978 ml of H2O.  Total free water : 1158 ml free water  daily    -Continue 1 mg folic acid  daily -Continue 2 mg copper  daily x 60 days  -400 units vitamin E daily -1000 units vitamin A daily   NUTRITION DIAGNOSIS:   Inadequate oral intake related to cancer and cancer related treatments as evidenced by other (comment) (pt with duodenal mass requiring J-tube placement).  Ongoing  GOAL:   Patient will meet greater than or equal to 90% of their needs  Met with TF  MONITOR:   PO intake, Labs, Weight trends, TF tolerance, I & O's, Skin  REASON FOR ASSESSMENT:   Consult Enteral/tube feeding initiation and management  ASSESSMENT:   68 y/o female with h/o HTN, GERD, hepatitis B, s/p open cholecystectomy (1075), morbid obesity s/p laparoscopic sleeve gastrectomy (2019), DM, PAF, OSA, COPD,  goiter, metastatic renal cell carcinoma on Ipilimumab (IPI) and Nivolumab (NIVO) immunotherapy at [facility], newly diagnosed pancreatic mass with biliary obstruction s/p IR drain placement 8/13 (replaced 8/26 & 9/2) and recent admission for sepsis and who is now admitted with FTT and dehydration now s/p robotic assisted laparoscopic feeding jejunostomy tube placement (39F) 9/25.  9/19- s/p Cholangiogram through biliary drain; common bile duct stent placement with covered metallic stent  9/25- s/p j-tube placement by surgery 9/26- TF held, x-ray confirmed j-tube in good position, TF resumed 9/27- TF held secondary to vomiting 9/29- contrast study revealed no evidence of obstruction from jejunostomy although no progression of PO contrast 9/30- NGT placed for decompression (tip of tube in stomach per KUB)  Reviewed I/O's: +2.6 L x 24 hours and +18.2 L since 07/15/24  Pt sitting up in bed at time of visit. Pt much more alert in comparison to prior visits. Pt smiling, stating that she feels a lot better today. She denies any nausea, vomiting, or abdominal pain. Pt's only complaint is the discomfort of NGT.   Pt receiving Osmolite 1.5 at goal rate of 50 ml/hr. Plan for clamping trials today per MD.    Medications reviewed and include reglan , protonix , and thiamine .   Labs reviewed: CBGS: 146-197 (inpatient orders for glycemic control are ).  Iron: 21, copper : 73, vitamin K: <10; zinc , vitamin B-12, vitamin D  WDL; vitamin A : 2.5 and vitamin E: 8.0.  Diet Order:  Diet Order             Diet NPO time specified  Diet effective now                   EDUCATION NEEDS:   No education needs have been identified at this time  Skin:  Skin Assessment: Skin Integrity Issues: Skin Integrity Issues:: Incisions Incisions: closed abdomen  Last BM:  07/27/24 (type 6)  Height:   Ht Readings from Last 1 Encounters:  07/22/24 5' (1.524 m)    Weight:   Wt Readings from Last 1 Encounters:  07/29/24  112 kg    Ideal Body Weight:  45.5 kg  BMI:  Body mass index is 48.24 kg/m.  Estimated Nutritional Needs:   Kcal:  2000-2300kcal/day  Protein:  100-115g/day  Fluid:  1.6-1.8L/day    Margery ORN, RD, LDN, CDCES Registered Dietitian III Certified Diabetes Care and Education Specialist If unable to reach this RD, please use RD Inpatient group chat on secure chat between hours of 8am-4 pm daily

## 2024-07-29 NOTE — Progress Notes (Signed)
 Mobility Specialist - Progress Note  Post-mobility: HR 100, SPO2 93%   07/29/24 1503  Mobility  Activity Ambulated with assistance;Stood at bedside  Level of Assistance Contact guard assist, steadying assist (+2)  Distance Ambulated (ft) 8 ft  Activity Response Tolerated well  Mobility visit 1 Mobility  Mobility Specialist Start Time (ACUTE ONLY) 1440  Mobility Specialist Stop Time (ACUTE ONLY) 1458  Mobility Specialist Time Calculation (min) (ACUTE ONLY) 18 min   Pt supine upon entry, utilizing RA. Pt motivated and agreeable to OOB amb this date. Pt completed bed mob HHA to bring BLE EOB. Pt STS to RW MinA (+2 for safety, CGA) and amb to the end of the bed and returned to the [facility]. Pt STS to RW MinA of one, stood at bedside and completed 6 standing marches before returning EOB. Pt returned supine, left with alarm set and needs within reach.  America Silvan Mobility Specialist 07/29/24 3:13 PM

## 2024-07-29 NOTE — Progress Notes (Signed)
 PROGRESS NOTE    Alyssa Leonard  FMW:[id number] DOB: 10-27-1956 DOA: 07/13/2024 PCP: [name] LABOR, MD  Chief Complaint  Patient presents with   abnormal labs    Hospital Course:  Alyssa Leonard 68 year old female with history of renal cell carcinoma, obstructive jaundice secondary to malignant neoplasm of the pancreas, paroxysmal A-fib on Eliquis , hypertension, OSA, GERD, morbid obesity, who presents to the ED with AKI.  Patient reports that she has had poor p.o. intake since cancer diagnosis.  She also endorses recurrent nausea.  She has recently met with oncology and palliative care and is planning for biopsy of the pancreatic mass.  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  Expanding metallic biliary stent was placed with improved biliary patency, external biliary stent was removed.  Stay is further complicated by ongoing AKI.  Ultimately patient elected for J-tube placement on 9/25.  Postoperative course has been complicated by large-volume vomiting.  Upper GI 9/29 showed contrast beyond jejunostomy but she continues to have trouble with p.o.  NG tube was placed while continuing NG tube feeds  Subjective: Acute events overnight.  Patient is drowsy.  Objective: Vitals:   07/28/24 1951 07/29/24 0354 07/29/24 0401 07/29/24 0812  BP: 135/80 121/71  125/68  Pulse: 94 88  94  Resp: 18 18  16   Temp: 97.7 F (36.5 C) 98.7 F (37.1 C)  98.7 F (37.1 C)  TempSrc: Oral Oral  Oral  SpO2: 98% 98%  98%  Weight:   112 kg   Height:        Intake/Output Summary (Last 24 hours) at 07/29/2024 1404 Last data filed at 07/29/2024 1130 Gross per 24 hour  Intake 2580.42 ml  Output --  Net 2580.42 ml   Filed Weights   07/27/24 0500 07/28/24 0500 07/29/24 0401  Weight: 113.8 kg 113.9 kg 112 kg    Examination: General exam: Appears calm and comfortable, NAD  Respiratory system: No work of  breathing, symmetric chest wall expansion Cardiovascular system: S1 & S2 heard, RRR.  Gastrointestinal system: Abdomen is nondistended, soft. NG in place. Neuro: Alert and oriented  Assessment & Plan:  Principal Problem:   AKI (acute kidney injury) Active Problems:   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)   Mass of head of pancreas   COPD (chronic obstructive pulmonary disease) (HCC)   OSA (obstructive sleep apnea)   Paroxysmal atrial flutter (HCC)   Hypertension   Diabetes mellitus without complication (HCC)   GERD (gastroesophageal reflux disease)   Neoplasm related pain   Palliative care encounter   Dehydration    AKI on CKD 3B - Multifactorial.  Prerenal secondary to poor p.o. intake, complicated by massive renal mass - Creatinine 0.7-1.3.  GFR 44. - Nephrology was consulted.  Patient is not a candidate for renal replacement therapy - Continue hydration via G-tube - Creatinine has improved some but appears to have stabilized.  Anticipate this is her new baseline.   Nausea Abdominal fullness Poor oral intake - Secondary to pancreatic mass and large renal mass - Still on scheduled Reglan  - History of Roux-en-Y procedure, large pancreatic mass, renal mass, not a candidate for PEG tube placement. --J tube placed on 07/22/24 - Continue n.p.o. while J-tube feeding is titrated.  NG tube in place.  Vomiting -Large-volume vomiting and once tube feeding was initiated - 9/29 upper GI with contrast beyond jejunostomy - NG tube placed for low intermittent suction but  output has been decreasing.  Will trial clamping today. - Continue with tube feeds   Hypernatremia  -Free water  deficit - Resolved now. - Continue to monitor   Malignant pancreatic mass - Presumed primary cause of all of the above - Patient previously seen [facility] oncology.  Prior biopsy revealed IPMN and patient was being monitored .  Appears she has now developed pancreatic cancer, but was still  pending biopsy via EUS at time of admission. - CT scan from 8/12 reviewed: Right renal mass 11 x 9 x 9 cm, abutting the adjacent duodenum.  Mass of pancreatic head 3 x 5 cm. - Patient has recently established with oncology.  She was planning for outpatient biopsy but unfortunately with her decline in appetite, worsening kidney function, and poor functional status she would not be a candidate for any cancer therapies at this time.  She is not a surgical candidate.  Please see oncology notes from 9/22 for more detail - Patient was initially considering discharge home with hospice but has now opted for J-tube placement with hopes that this will increase her functional status. - CA 19-9 has resulted and is very elevated. - Palliative care consulted - 9/19: Cholangiogram through biliary drain, bile duct stent placement with covered metallic stent. --pt asked for code status to be changed to Full Code on 9/24 --pt asked for code status to be changed back to DNR-limited on 9/25.   --Onc palliative care following, pt is not ready for hospice.   Stage III metastatic renal cell carcinoma - Very large renal mass causing compression and upward displacement. - Previously on immunotherapy - Oncology follow-up as above --Onc palliative care following, pt is not ready for hospice.   Paroxysmal A-fib --cont Eliquis    OSA - Continue CPAP at night   COPD, no acute exacerbation   Morbid obesity, BMI 43.92   Hx of DM, not currently active --d/c'ed BG checks and SSI  DVT prophylaxis: Eliquis    Code Status: Limited: Do not attempt resuscitation (DNR) -DNR-LIMITED -Do Not Intubate/DNI  Disposition:  Inpatient pending NGT removal and J tube feeding tolerance  Consultants:  Treatment Team:  Consulting Physician: Melanee Annah BROCKS, MD  Procedures:    Antimicrobials:  Anti-infectives (From admission, onward)    Start     Dose/Rate Route Frequency Ordered Stop   07/22/24 2200  cefoTEtan  (CEFOTAN ) 2 g in  sodium chloride  0.9 % 100 mL IVPB        2 g 200 mL/hr over 30 Minutes Intravenous Every 12 hours 07/22/24 1143 07/23/24 1218   07/22/24 0600  cefoTEtan  (CEFOTAN ) 2 g in sodium chloride  0.9 % 100 mL IVPB        2 g 200 mL/hr over 30 Minutes Intravenous On call to O.R. 07/21/24 0802 07/22/24 0756   07/16/24 1515  cefOXitin  (MEFOXIN ) 2 g in sodium chloride  0.9 % 100 mL IVPB        2 g 200 mL/hr over 30 Minutes Intravenous  Once 07/16/24 1422 07/16/24 1513       Data Reviewed: I have personally reviewed following labs and imaging studies CBC: No results for input(s): WBC, NEUTROABS, HGB, HCT, MCV, PLT in the last 168 hours. Basic Metabolic Panel: Recent Labs  Lab 07/23/24 0341 07/24/24 0300 07/25/24 0617 07/26/24 0344 07/27/24 0433 07/28/24 0541 07/29/24 0238  NA  --   --  150* 145 146* 145 141  K  --   --  3.6 4.3 3.6 3.7 3.8  CL  --   --  116* 117* 114* 112* 111  CO2  --   --  23 23 24 25 24   GLUCOSE  --   --  110* 117* 118* 183* 191*  BUN  --   --  45* 43* 39* 34* 36*  CREATININE 2.39*  --  2.32* 2.20* 2.02* 2.06* 2.07*  CALCIUM   --   --  8.5* 8.2* 8.2* 8.0* 7.8*  MG 1.9 1.9 2.4  --   --  2.6* 2.4  PHOS 3.2 2.9 2.4*  --   --  2.0* 1.4*   GFR: Estimated Creatinine Clearance: 29.6 mL/min (A) (by C-G formula based on SCr of 2.07 mg/dL (H)). Liver Function Tests: No results for input(s): AST, ALT, ALKPHOS, BILITOT, PROT, ALBUMIN in the last 168 hours. CBG: Recent Labs  Lab 07/28/24 2124 07/28/24 2348 07/29/24 0355 07/29/24 0814 07/29/24 1206  GLUCAP 164* 146* 174* 191* 180*    No results found for this or any previous visit (from the past 240 hours).   Radiology Studies: DG Abd 1 View Result Date: 07/27/2024 CLINICAL DATA:  NG tube placement. EXAM: ABDOMEN - 1 VIEW COMPARISON:  Chest x-ray 07/27/2024 FINDINGS: Nasogastric tube is present with tip over the stomach in the left upper quadrant. Multiple surgical clips over the left mid to upper  abdomen. Stent over the midline of the mid abdomen. Bowel gas pattern is nonobstructive. Remainder the exam is unchanged. IMPRESSION: 1. Nonobstructive bowel gas pattern. 2. Nasogastric tube with tip over the stomach in the left upper quadrant. Electronically Signed   By: Toribio Agreste M.D.   On: 07/27/2024 17:31    Scheduled Meds:  amiodarone   200 mg Per Tube Daily   apixaban   5 mg Per Tube BID   calcium  carbonate  1 tablet Per Tube TID   Chlorhexidine  Gluconate Cloth  6 each Topical Q0600   copper   2 mg Per Tube Daily   folic acid   1 mg Per Tube Daily   free water   100 mL Per Tube Q4H   lidocaine   10 mL Intradermal Once   metoCLOPramide  (REGLAN ) injection  5 mg Intravenous Q8H   metoprolol  tartrate  25 mg Per Tube BID   multivitamin with minerals  1 tablet Per Tube BID   OLANZapine   10 mg Per Tube QHS   pantoprazole  (PROTONIX ) IV  40 mg Intravenous Q24H   sodium chloride  flush  10-40 mL Intracatheter Q12H   thiamine   100 mg Per Tube Daily   Continuous Infusions:  feeding supplement (OSMOLITE 1.5 CAL) 60 mL/hr at 07/29/24 0415     LOS: 13 days  MDM: Patient is high risk for one or more organ failure.  They necessitate ongoing hospitalization for continued IV therapies and subsequent lab monitoring. Total time spent interpreting labs and vitals, reviewing the medical record, coordinating care amongst consultants and care team members, directly assessing and discussing care with the patient and/or family: 55 min Keonta Alsip, DO [facility]  To contact the attending physician between 7A-7P please use Epic Chat. To contact the covering physician during after hours 7P-7A, please review Amion.  07/29/2024, 2:04 PM   *This document has been created with the assistance of dictation software. Please excuse typographical errors. *

## 2024-07-29 NOTE — Progress Notes (Signed)
 PHARMACY CONSULT NOTE - ELECTROLYTES  Pharmacy Consult for Electrolyte Monitoring and Replacement   Recent Labs: Height: 5' (152.4 cm) Weight: 112 kg (247 lb) IBW/kg (Calculated) : 45.5 Estimated Creatinine Clearance: 29.6 mL/min (A) (by C-G formula based on SCr of 2.07 mg/dL (H)). Potassium (mmol/L)  Date Value  07/29/2024 3.8   Magnesium  (mg/dL)  Date Value  89/97/7974 2.4   Calcium  (mg/dL)  Date Value  89/97/7974 7.8 (L)   Albumin (g/dL)  Date Value  90/76/7974 2.1 (L)   Phosphorus (mg/dL)  Date Value  89/97/7974 1.4 (L)   Sodium (mmol/L)  Date Value  07/29/2024 141   Corrected Ca: 9.32 mg/dL  Assessment  Alyssa Leonard is a 68 y.o. female presenting with AKI. PMH significant for metastatic renal cell carcinoma, A. Fib, COPD, and DM. Pharmacy has been consulted to monitor and replace electrolytes.  Diet: NPO MIVF:  @  mL/hr   Goal of Therapy: Electrolytes WNL  Plan:  Provider has ordered KPhos 45mmol IV.  No additional replacement warranted at this time Check BMP, Mg, Phos with AM labs  Thank you for allowing pharmacy to be a part of this patient's care.  Estill CHRISTELLA Lutes, PharmD, BCPS Clinical Pharmacist 07/29/2024 3:20 PM

## 2024-07-29 NOTE — Plan of Care (Signed)
  Problem: Education: Goal: Ability to describe self-care measures that may prevent or decrease complications (Diabetes Survival Skills Education) will improve Outcome: Progressing   Problem: Coping: Goal: Ability to adjust to condition or change in health will improve Outcome: Adequate for Discharge   Problem: Nutritional: Goal: Maintenance of adequate nutrition will improve Outcome: Progressing

## 2024-07-30 DIAGNOSIS — N179 Acute kidney failure, unspecified: Secondary | ICD-10-CM | POA: Diagnosis not present

## 2024-07-30 LAB — BASIC METABOLIC PANEL WITH GFR
Anion gap: 8 (ref 5–15)
BUN: 36 mg/dL — ABNORMAL HIGH (ref 8–23)
CO2: 25 mmol/L (ref 22–32)
Calcium: 7.8 mg/dL — ABNORMAL LOW (ref 8.9–10.3)
Chloride: 110 mmol/L (ref 98–111)
Creatinine, Ser: 1.85 mg/dL — ABNORMAL HIGH (ref 0.44–1.00)
GFR, Estimated: 29 mL/min — ABNORMAL LOW (ref >60)
Glucose, Bld: 188 mg/dL — ABNORMAL HIGH (ref 70–99)
Potassium: 4.7 mmol/L (ref 3.5–5.1)
Sodium: 143 mmol/L (ref 135–145)

## 2024-07-30 LAB — MAGNESIUM: Magnesium: 2.3 mg/dL (ref 1.7–2.4)

## 2024-07-30 LAB — GLUCOSE, CAPILLARY
Glucose-Capillary: 150 mg/dL — ABNORMAL HIGH (ref 70–99)
Glucose-Capillary: 152 mg/dL — ABNORMAL HIGH (ref 70–99)
Glucose-Capillary: 153 mg/dL — ABNORMAL HIGH (ref 70–99)
Glucose-Capillary: 171 mg/dL — ABNORMAL HIGH (ref 70–99)
Glucose-Capillary: 172 mg/dL — ABNORMAL HIGH (ref 70–99)
Glucose-Capillary: 197 mg/dL — ABNORMAL HIGH (ref 70–99)

## 2024-07-30 LAB — VITAMIN B1: Vitamin B1 (Thiamine): 46.9 nmol/L — ABNORMAL LOW (ref 66.5–200.0)

## 2024-07-30 LAB — PHOSPHORUS: Phosphorus: 2.6 mg/dL (ref 2.5–4.6)

## 2024-07-30 NOTE — Progress Notes (Signed)
 PHARMACY CONSULT NOTE - ELECTROLYTES  Pharmacy Consult for Electrolyte Monitoring and Replacement   Recent Labs: Height: 5' (152.4 cm) Weight: 107 kg (235 lb 14.3 oz) IBW/kg (Calculated) : 45.5 Estimated Creatinine Clearance: 32.2 mL/min (A) (by C-G formula based on SCr of 1.85 mg/dL (H)). Potassium (mmol/L)  Date Value  07/30/2024 4.7   Magnesium  (mg/dL)  Date Value  89/96/7974 2.3   Calcium  (mg/dL)  Date Value  89/96/7974 7.8 (L)   Albumin (g/dL)  Date Value  90/76/7974 2.1 (L)   Phosphorus (mg/dL)  Date Value  89/96/7974 2.6   Sodium (mmol/L)  Date Value  07/30/2024 143   Corrected Ca: 9.32 mg/dL  Assessment  Alyssa Leonard is a 68 y.o. female presenting with AKI. PMH significant for metastatic renal cell carcinoma, A. Fib, COPD, and DM. Pharmacy has been consulted to monitor and replace electrolytes.  Diet: NPO MIVF:  N/A  Goal of Therapy: Electrolytes WNL  Plan:  No electrolyte replacement warranted this morning Check BMP, Mg, Phos with AM labs  Thank you for allowing pharmacy to be a part of this patient's care.  Estill CHRISTELLA Lutes, PharmD, BCPS Clinical Pharmacist 07/30/2024 6:59 AM

## 2024-07-30 NOTE — Plan of Care (Signed)
  Problem: Education: Goal: Ability to describe self-care measures that may prevent or decrease complications (Diabetes Survival Skills Education) will improve Outcome: Progressing Goal: Individualized Educational Video(s) Outcome: Progressing   Problem: Coping: Goal: Ability to adjust to condition or change in health will improve Outcome: Progressing   Problem: Fluid Volume: Goal: Ability to maintain a balanced intake and output will improve Outcome: Progressing   Problem: Health Behavior/Discharge Planning: Goal: Ability to identify and utilize available resources and services will improve Outcome: Progressing Goal: Ability to manage health-related needs will improve Outcome: Progressing   Problem: Metabolic: Goal: Ability to maintain appropriate glucose levels will improve Outcome: Progressing   Problem: Nutritional: Goal: Maintenance of adequate nutrition will improve Outcome: Progressing Goal: Progress toward achieving an optimal weight will improve Outcome: Progressing   Problem: Skin Integrity: Goal: Risk for impaired skin integrity will decrease Outcome: Progressing   Problem: Tissue Perfusion: Goal: Adequacy of tissue perfusion will improve Outcome: Progressing   Problem: Education: Goal: Knowledge of General Education information will improve Description: Including pain rating scale, medication(s)/side effects and non-pharmacologic comfort measures Outcome: Progressing   Problem: Health Behavior/Discharge Planning: Goal: Ability to manage health-related needs will improve Outcome: Progressing   Problem: Clinical Measurements: Goal: Ability to maintain clinical measurements within normal limits will improve Outcome: Progressing Goal: Will remain free from infection Outcome: Progressing Goal: Diagnostic test results will improve Outcome: Progressing Goal: Respiratory complications will improve Outcome: Progressing Goal: Cardiovascular complication will  be avoided Outcome: Progressing   Problem: Activity: Goal: Risk for activity intolerance will decrease Outcome: Progressing   Problem: Nutrition: Goal: Adequate nutrition will be maintained Outcome: Progressing   Problem: Coping: Goal: Level of anxiety will decrease Outcome: Progressing   Problem: Elimination: Goal: Will not experience complications related to bowel motility Outcome: Progressing Goal: Will not experience complications related to urinary retention Outcome: Progressing   Problem: Safety: Goal: Ability to remain free from injury will improve Outcome: Progressing   Problem: Pain Managment: Goal: General experience of comfort will improve and/or be controlled Outcome: Progressing   Problem: Skin Integrity: Goal: Risk for impaired skin integrity will decrease Outcome: Progressing

## 2024-07-30 NOTE — Progress Notes (Signed)
 PROGRESS NOTE    Alyssa Leonard  FMW:[id number] DOB: Aug 02, 1956 DOA: 07/13/2024 PCP: [name] LABOR, MD  Chief Complaint  Patient presents with   abnormal labs    Hospital Course:  Alyssa Leonard 68 year old female with history of renal cell carcinoma, obstructive jaundice secondary to malignant neoplasm of the pancreas, paroxysmal A-fib on Eliquis , hypertension, OSA, GERD, morbid obesity, who presents to the ED with AKI.  Patient reports that she has had poor p.o. intake since cancer diagnosis.  She also endorses recurrent nausea.  She has recently met with oncology and palliative care and is planning for biopsy of the pancreatic mass.  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  Expanding metallic biliary stent was placed with improved biliary patency, external biliary stent was removed.  Stay is further complicated by ongoing AKI.  Ultimately patient elected for J-tube placement on 9/25.  Postoperative course has been complicated by large-volume vomiting.  Upper GI 9/29 showed contrast beyond jejunostomy but she continues to have trouble with p.o.  NG tube was placed while continuing NG tube feeds  Subjective: Patient has done well with clamped NG tube overnight.  No nausea, no vomiting, no worsening of abdominal pain.  She is anxious to have it removed.  Objective: Vitals:   07/29/24 1956 07/30/24 0355 07/30/24 0401 07/30/24 0731  BP: (!) 148/90 (!) 141/53  (!) 107/59  Pulse: 98 98  91  Resp: 16 19  17   Temp: 98.5 F (36.9 C) 98.3 F (36.8 C)  98.6 F (37 C)  TempSrc:      SpO2: (!) 72% 99%  100%  Weight:   107 kg   Height:        Intake/Output Summary (Last 24 hours) at 07/30/2024 1442 Last data filed at 07/30/2024 1302 Gross per 24 hour  Intake 3940.75 ml  Output --  Net 3940.75 ml   Filed Weights   07/28/24 0500 07/29/24 0401 07/30/24 0401  Weight: 113.9 kg 112 kg 107 kg     Examination: General exam: Appears calm and comfortable, NAD  Respiratory system: No work of breathing, symmetric chest wall expansion Cardiovascular system: S1 & S2 heard, RRR.  Gastrointestinal system: Abdomen is nondistended, soft. NG in place and clamped Neuro: Alert and oriented  Assessment & Plan:  Principal Problem:   AKI (acute kidney injury) Active Problems:   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)   Mass of head of pancreas   COPD (chronic obstructive pulmonary disease) (HCC)   OSA (obstructive sleep apnea)   Paroxysmal atrial flutter (HCC)   Hypertension   Diabetes mellitus without complication (HCC)   GERD (gastroesophageal reflux disease)   Neoplasm related pain   Palliative care encounter   Dehydration    AKI on CKD 3B - Multifactorial.  Prerenal secondary to poor p.o. intake, complicated by massive renal mass - Creatinine 0.7-1.3.  GFR 44. - Nephrology was consulted.  Patient is not a candidate for renal replacement therapy - Continue hydration via J-tube - Creatinine continues to improve very slowly.  Anticipate she is at her new baseline.   Nausea Abdominal fullness Poor oral intake - Secondary to pancreatic mass and large renal mass - Still on scheduled Reglan  - History of Roux-en-Y procedure, large pancreatic mass, renal mass, not a candidate for PEG tube placement. --J tube placed on 07/22/24 - She is tolerating J-tube feeding now - NG tube clamped 10/2, have removed this morning.  Patient is doing well without it.  It was initially placed essentially for venting.  She is high risk for recurrence of abdominal wall/stomach bloating given nearly obstructive mass  Vomiting, resolved -Large-volume vomiting and once tube feeding was initiated - 9/29 upper GI with contrast beyond jejunostomy - NG tube removed today. - Continue with tube feeds   Hypernatremia  -Free water  deficit - Resolved now. - Continue to monitor   Malignant  pancreatic mass - Presumed primary cause of all of the above - Patient previously seen [facility] oncology.  Prior biopsy revealed IPMN and patient was being monitored .  Appears she has now developed pancreatic cancer, but was still pending biopsy via EUS at time of admission. - CT scan from 8/12 reviewed: Right renal mass 11 x 9 x 9 cm, abutting the adjacent duodenum.  Mass of pancreatic head 3 x 5 cm. - Patient has recently established with oncology.  She was planning for outpatient biopsy but unfortunately with her decline in appetite, worsening kidney function, and poor functional status she would not be a candidate for any cancer therapies at this time.  She is not a surgical candidate.  Please see oncology notes from 9/22 for more detail - Patient was initially considering discharge home with hospice but has now opted for J-tube placement with hopes that this will increase her functional status. - CA 19-9 has resulted and is very elevated. - Palliative care consulted - 9/19: Cholangiogram through biliary drain, bile duct stent placement with covered metallic stent. --pt asked for code status to be changed to Full Code on 9/24 --pt asked for code status to be changed back to DNR-limited on 9/25.   --Onc palliative care following, pt is not ready for hospice.   Stage III metastatic renal cell carcinoma - Very large renal mass causing compression and upward displacement. - Previously on immunotherapy - Oncology follow-up as above --Onc palliative care following, pt is not ready for hospice.   Paroxysmal A-fib --cont Eliquis    OSA - Continue CPAP at night   COPD, no acute exacerbation   Morbid obesity, BMI 43.92   Hx of DM, not currently active --d/c'ed BG checks and SSI  DVT prophylaxis: Eliquis    Code Status: Limited: Do not attempt resuscitation (DNR) -DNR-LIMITED -Do Not Intubate/DNI  Disposition: tolerating J-tube now.  NG has been removed.  Can proceed with SNF search.  TOC  aware  Consultants:  Treatment Team:  Consulting Physician: Melanee Annah BROCKS, MD  Procedures:    Antimicrobials:  Anti-infectives (From admission, onward)    Start     Dose/Rate Route Frequency Ordered Stop   07/22/24 2200  cefoTEtan  (CEFOTAN ) 2 g in sodium chloride  0.9 % 100 mL IVPB        2 g 200 mL/hr over 30 Minutes Intravenous Every 12 hours 07/22/24 1143 07/23/24 1218   07/22/24 0600  cefoTEtan  (CEFOTAN ) 2 g in sodium chloride  0.9 % 100 mL IVPB        2 g 200 mL/hr over 30 Minutes Intravenous On call to O.R. 07/21/24 0802 07/22/24 0756   07/16/24 1515  cefOXitin  (MEFOXIN ) 2 g in sodium chloride  0.9 % 100 mL IVPB        2 g 200 mL/hr over 30 Minutes Intravenous  Once 07/16/24 1422 07/16/24 1513       Data Reviewed: I have personally reviewed following labs and imaging studies CBC: No results for input(s): WBC, NEUTROABS, HGB, HCT, MCV, PLT in the last 168 hours. Basic  Metabolic Panel: Recent Labs  Lab 07/24/24 0300 07/24/24 0300 07/25/24 0617 07/26/24 0344 07/27/24 0433 07/28/24 0541 07/29/24 0238 07/29/24 1954 07/30/24 0438  NA  --    < > 150* 145 146* 145 141  --  143  K  --    < > 3.6 4.3 3.6 3.7 3.8  --  4.7  CL  --    < > 116* 117* 114* 112* 111  --  110  CO2  --    < > 23 23 24 25 24   --  25  GLUCOSE  --    < > 110* 117* 118* 183* 191*  --  188*  BUN  --    < > 45* 43* 39* 34* 36*  --  36*  CREATININE  --    < > 2.32* 2.20* 2.02* 2.06* 2.07*  --  1.85*  CALCIUM   --    < > 8.5* 8.2* 8.2* 8.0* 7.8*  --  7.8*  MG 1.9  --  2.4  --   --  2.6* 2.4  --  2.3  PHOS 2.9  --  2.4*  --   --  2.0* 1.4* 1.8* 2.6   < > = values in this interval not displayed.   GFR: Estimated Creatinine Clearance: 32.2 mL/min (A) (by C-G formula based on SCr of 1.85 mg/dL (H)). Liver Function Tests: No results for input(s): AST, ALT, ALKPHOS, BILITOT, PROT, ALBUMIN in the last 168 hours. CBG: Recent Labs  Lab 07/29/24 2023 07/30/24 0013 07/30/24 0358  07/30/24 0733 07/30/24 1109  GLUCAP 167* 152* 171* 197* 153*    No results found for this or any previous visit (from the past 240 hours).   Radiology Studies: No results found.   Scheduled Meds:  amiodarone   200 mg Per Tube Daily   apixaban   5 mg Per Tube BID   calcium  carbonate  1 tablet Per Tube TID   Chlorhexidine  Gluconate Cloth  6 each Topical Q0600   copper   2 mg Per Tube Daily   folic acid   1 mg Per Tube Daily   free water   100 mL Per Tube Q4H   insulin  aspart  0-6 Units Subcutaneous TID WC   lidocaine   10 mL Intradermal Once   metoCLOPramide  (REGLAN ) injection  5 mg Intravenous Q8H   metoprolol  tartrate  25 mg Per Tube BID   multivitamin with minerals  1 tablet Per Tube BID   OLANZapine   10 mg Per Tube QHS   pantoprazole  (PROTONIX ) IV  40 mg Intravenous Q24H   sodium chloride  flush  10-40 mL Intracatheter Q12H   thiamine   100 mg Per Tube Daily   vitamin A  10,000 Units Oral Daily   vitamin E  400 Units Oral Daily   Continuous Infusions:  feeding supplement (OSMOLITE 1.5 CAL) 65 mL/hr at 07/30/24 0243     LOS: 14 days  MDM: Patient is high risk for one or more organ failure.  They necessitate ongoing hospitalization for continued IV therapies and subsequent lab monitoring. Total time spent interpreting labs and vitals, reviewing the medical record, coordinating care amongst consultants and care team members, directly assessing and discussing care with the patient and/or family: 55 min Brelee Renk, DO [facility]  To contact the attending physician between 7A-7P please use Epic Chat. To contact the covering physician during after hours 7P-7A, please review Amion.  07/30/2024, 2:42 PM   *This document has been created with the assistance of dictation software. Please excuse  typographical errors. *

## 2024-07-30 NOTE — Progress Notes (Signed)
 Nutrition Follow-up  DOCUMENTATION CODES:   Morbid obesity  INTERVENTION:   -Continue MVI with minerals BID -Continue 500 mg calcium  carbonate TID -Continue regular diet -TF via j-tube:    Osmolite 1.5 @ 50 ml/hr    60 ml Prosource TF20 daily   30 ml free water  flush every 4 hours   Tube feeding regimen provides 1880 kcal (100% of needs), 95 grams of protein, and 914 ml of H2O. Total free water : 1094 ml daily    -Continue 100 mg thiamine  daily x 7 days -Monitor Mg, K, and Phos and replete as needed secondary to high refeeding risk -Pt with history of gastric sleeve with poor oral intake and significant weight loss. RD will draw labs to rule out potential micronutrient deficiencies: iron, folic acid , thiamine , copper , zinc , vitamin B-12, vitamin D , vitamin A, vitamin E, vitamin A, and calcium  -When transitioning to home, recommend transition to nocturnal feedings. Pt is not a candidate for bolus feedings secondary to j-tube:   Nutren 1.5 (or equivalent) @ 80 ml/hr x 16 hours   30 ml free water  flush every 4 hours   Tube feeding regimen provides 1920 kcals, 87 grams of protein, and 978 ml of H2O.  Total free water : 1158 ml free water  daily    -Continue 1 mg folic acid  daily -Continue 2 mg copper  daily x 60 days  -Continue 400 units vitamin E daily -Continue 1000 units vitamin A daily   NUTRITION DIAGNOSIS:   Inadequate oral intake related to cancer and cancer related treatments as evidenced by other (comment) (pt with duodenal mass requiring J-tube placement).  Ongoing  GOAL:   Patient will meet greater than or equal to 90% of their needs  Met with TF  MONITOR:   PO intake, Labs, Weight trends, TF tolerance, I & O's, Skin  REASON FOR ASSESSMENT:   Consult Enteral/tube feeding initiation and management  ASSESSMENT:   68 y/o female with h/o HTN, GERD, hepatitis B, s/p open cholecystectomy (1075), morbid obesity s/p laparoscopic sleeve gastrectomy (2019), DM,  PAF, OSA, COPD, goiter, metastatic renal cell carcinoma on Ipilimumab (IPI) and Nivolumab (NIVO) immunotherapy at [facility], newly diagnosed pancreatic mass with biliary obstruction s/p IR drain placement 8/13 (replaced 8/26 & 9/2) and recent admission for sepsis and who is now admitted with FTT and dehydration now s/p robotic assisted laparoscopic feeding jejunostomy tube placement (52F) 9/25.  9/19- s/p Cholangiogram through biliary drain; common bile duct stent placement with covered metallic stent  9/25- s/p j-tube placement by surgery 9/26- TF held, x-ray confirmed j-tube in good position, TF resumed 9/27- TF held secondary to vomiting 9/29- contrast study revealed no evidence of obstruction from jejunostomy although no progression of PO contrast 9/30- NGT placed for decompression (tip of tube in stomach per KUB)  Reviewed I/O's: +3.9 L x 24 hours and +21 L since 07/16/24  Pt unavailable at time of visit. Attempted to speak with pt via call to hospital room phone, however, unable to reach.   Case discussed with RN, who reports pt continues to tolerate TF well at goal rate.   NGT clamped this morning.  Medications reviewed and include calcium  cabronate, copper , folic acid , reglan , protonix , thiamine , vitamin A, and vitamin E.  Labs reviewed: CBGS: 152-197 (inpatient orders for glycemic control are 0-6 insulin  aspart TID with meals).  Iron: 21, copper : 73, vitamin K: <10; zinc , vitamin B-12, vitamin D  WDL; vitamin A : 2.5 and vitamin E: 8.0.   Diet Order:   Diet Order  Diet clear liquid Room service appropriate? Yes; Fluid consistency: Thin  Diet effective now                   EDUCATION NEEDS:   No education needs have been identified at this time  Skin:  Skin Assessment: Skin Integrity Issues: Skin Integrity Issues:: Incisions Incisions: closed abdomen  Last BM:  07/27/24 (type 6)  Height:   Ht Readings from Last 1 Encounters:  07/22/24 5' (1.524 m)     Weight:   Wt Readings from Last 1 Encounters:  07/30/24 107 kg    Ideal Body Weight:  45.5 kg  BMI:  Body mass index is 46.07 kg/m.  Estimated Nutritional Needs:   Kcal:  2000-2300kcal/day  Protein:  100-115g/day  Fluid:  1.6-1.8L/day    Margery ORN, RD, LDN, CDCES Registered Dietitian III Certified Diabetes Care and Education Specialist If unable to reach this RD, please use RD Inpatient group chat on secure chat between hours of 8am-4 pm daily

## 2024-07-30 NOTE — Plan of Care (Signed)
  Problem: Coping: Goal: Ability to adjust to condition or change in health will improve Outcome: Progressing   Problem: Fluid Volume: Goal: Ability to maintain a balanced intake and output will improve Outcome: Progressing   Problem: Metabolic: Goal: Ability to maintain appropriate glucose levels will improve Outcome: Progressing   Problem: Nutritional: Goal: Maintenance of adequate nutrition will improve Outcome: Progressing Goal: Progress toward achieving an optimal weight will improve Outcome: Progressing   Problem: Skin Integrity: Goal: Risk for impaired skin integrity will decrease Outcome: Progressing   Problem: Tissue Perfusion: Goal: Adequacy of tissue perfusion will improve Outcome: Progressing   Problem: Clinical Measurements: Goal: Ability to maintain clinical measurements within normal limits will improve Outcome: Progressing Goal: Will remain free from infection Outcome: Progressing

## 2024-07-30 NOTE — Progress Notes (Signed)
 [facility]  ROUNDING NOTE   Subjective:   Patient seen sitting up in chair NGT in place, nursing to remove later this morning Denies nausea  Tube feeds at 74ml/hr Creatinine 1.85  Objective:  Vital signs in last 24 hours:  Temp:  [98.3 F (36.8 C)-98.6 F (37 C)] 98.6 F (37 C) (10/03 0731) Pulse Rate:  [91-98] 91 (10/03 0731) Resp:  [16-19] 17 (10/03 0731) BP: (107-148)/(53-114) 107/59 (10/03 0731) SpO2:  [72 %-100 %] 100 % (10/03 0731) Weight:  [892 kg] 107 kg (10/03 0401)  Weight change: -5.038 kg Filed Weights   07/28/24 0500 07/29/24 0401 07/30/24 0401  Weight: 113.9 kg 112 kg 107 kg    Intake/Output: I/O last 3 completed shifts: In: 6221.2 [I.V.:10; Other:480; NG/GT:5731.2] Out: -    Intake/Output this shift:  Total I/O In: 200 [NG/GT:200] Out: -   Physical Exam: General: NAD  Head: Normocephalic, atraumatic. Moist oral mucosal membranes  Eyes: Anicteric  Lungs:  Clear to auscultation, normal effort  Heart: Regular rate and rhythm  Abdomen:  Soft, nontender, Jtube  Extremities: No peripheral edema.  Neurologic: Awake and alert  Skin: Warm,dry, no rash   NG tube    Basic Metabolic Panel: Recent Labs  Lab 07/24/24 0300 07/24/24 0300 07/25/24 0617 07/26/24 0344 07/27/24 0433 07/28/24 0541 07/29/24 0238 07/29/24 1954 07/30/24 0438  NA  --    < > 150* 145 146* 145 141  --  143  K  --    < > 3.6 4.3 3.6 3.7 3.8  --  4.7  CL  --    < > 116* 117* 114* 112* 111  --  110  CO2  --    < > 23 23 24 25 24   --  25  GLUCOSE  --    < > 110* 117* 118* 183* 191*  --  188*  BUN  --    < > 45* 43* 39* 34* 36*  --  36*  CREATININE  --    < > 2.32* 2.20* 2.02* 2.06* 2.07*  --  1.85*  CALCIUM   --    < > 8.5* 8.2* 8.2* 8.0* 7.8*  --  7.8*  MG 1.9  --  2.4  --   --  2.6* 2.4  --  2.3  PHOS 2.9  --  2.4*  --   --  2.0* 1.4* 1.8* 2.6   < > = values in this interval not displayed.    Liver Function Tests: No results for input(s): AST, ALT,  ALKPHOS, BILITOT, PROT, ALBUMIN in the last 168 hours.  No results for input(s): LIPASE, AMYLASE in the last 168 hours. No results for input(s): AMMONIA in the last 168 hours.  CBC: No results for input(s): WBC, NEUTROABS, HGB, HCT, MCV, PLT in the last 168 hours.   Cardiac Enzymes: No results for input(s): CKTOTAL, CKMB, CKMBINDEX, TROPONINI in the last 168 hours.  BNP: Invalid input(s): POCBNP  CBG: Recent Labs  Lab 07/29/24 2023 07/30/24 0013 07/30/24 0358 07/30/24 0733 07/30/24 1109  GLUCAP 167* 152* 171* 197* 153*    Microbiology: Results for orders placed or performed during the hospital encounter of 06/29/24  Culture, blood (x 2)     Status: None   Collection Time: 06/29/24  4:39 PM   Specimen: BLOOD  Result Value Ref Range Status   Specimen Description BLOOD LEFT ANTECUBITAL  Final   Special Requests   Final    BOTTLES DRAWN AEROBIC AND ANAEROBIC Blood Culture  adequate volume   Culture   Final    NO GROWTH 5 DAYS Performed at [facility], [address] [place], [place] [address]    Report Status 07/04/2024 FINAL  Final  Culture, blood (Routine X 2) w Reflex to ID Panel     Status: None   Collection Time: 06/29/24  7:02 PM   Specimen: BLOOD  Result Value Ref Range Status   Specimen Description BLOOD BLOOD LEFT HAND  Final   Special Requests   Final    BOTTLES DRAWN AEROBIC ONLY Blood Culture results may not be optimal due to an inadequate volume of blood received in culture bottles   Culture   Final    NO GROWTH 5 DAYS Performed at [facility], [address], [place], [place] [address]    Report Status 07/04/2024 FINAL  Final    Coagulation Studies: No results for input(s): LABPROT, INR in the last 72 hours.  Urinalysis: No results for input(s): COLORURINE, LABSPEC, PHURINE, GLUCOSEU, HGBUR, BILIRUBINUR, KETONESUR, PROTEINUR, UROBILINOGEN, NITRITE, LEUKOCYTESUR in  the last 72 hours.  Invalid input(s): APPERANCEUR    Imaging: No results found.    Medications:    feeding supplement (OSMOLITE 1.5 CAL) 65 mL/hr at 07/30/24 0243    amiodarone   200 mg Per Tube Daily   apixaban   5 mg Per Tube BID   calcium  carbonate  1 tablet Per Tube TID   Chlorhexidine  Gluconate Cloth  6 each Topical Q0600   copper   2 mg Per Tube Daily   folic acid   1 mg Per Tube Daily   free water   100 mL Per Tube Q4H   insulin  aspart  0-6 Units Subcutaneous TID WC   lidocaine   10 mL Intradermal Once   metoCLOPramide  (REGLAN ) injection  5 mg Intravenous Q8H   metoprolol  tartrate  25 mg Per Tube BID   multivitamin with minerals  1 tablet Per Tube BID   OLANZapine   10 mg Per Tube QHS   pantoprazole  (PROTONIX ) IV  40 mg Intravenous Q24H   sodium chloride  flush  10-40 mL Intracatheter Q12H   thiamine   100 mg Per Tube Daily   vitamin A  10,000 Units Oral Daily   vitamin E  400 Units Oral Daily   albuterol , diazepam , famotidine , meclizine , ondansetron  (ZOFRAN ) IV, oxyCODONE , senna-docusate, sodium chloride  flush  Assessment/ Plan:  [name] is a 68 y.o.  female with past medical history of hypertension, GERD, OSA, obesity, pAfib, renal cell carcinoma, and malignant neoplasm of the pancreas, who was admitted to [facility] on 07/13/2024 for Dehydration [E86.0] Lack of appetite [R63.0] AKI (acute kidney injury) [N17.9]   Acute kidney injury on chronic kidney disease stage IIIb. Acute kidney injury appears prerenal due to persistent poor oral intake.Baseline creatinine 1.33 with GFR 44. Due to history of renal cell carcinoma and presence of pancreatic mass, palliative care consult would be appropriate to consider goals of care  Creatinine continues to improve with adequate hydration. Will continue to monitor.   Lab Results  Component Value Date   CREATININE 1.85 (H) 07/30/2024   CREATININE 2.07 (H) 07/29/2024   CREATININE 2.06 (H) 07/28/2024    Intake/Output Summary  (Last 24 hours) at 07/30/2024 1313 Last data filed at 07/30/2024 1302 Gross per 24 hour  Intake 3940.75 ml  Output --  Net 3940.75 ml   2. Acute metabolic acidosis, S bicarb 20 on admission. Likely secondary to kidney injury. S bicarb corrected  3. Anemia of chronic kidney disease  Lab Results  Component Value Date   HGB 8.7 (L) 07/20/2024   Avoiding ESA's due to cancer history.   4. Hypertension with chronic kidney disease. Well controlled with metoprolol .    LOS: 14 Doriana Mazurkiewicz 10/3/20251:13 PM

## 2024-07-31 DIAGNOSIS — N179 Acute kidney failure, unspecified: Secondary | ICD-10-CM | POA: Diagnosis not present

## 2024-07-31 LAB — GLUCOSE, CAPILLARY
Glucose-Capillary: 131 mg/dL — ABNORMAL HIGH (ref 70–99)
Glucose-Capillary: 143 mg/dL — ABNORMAL HIGH (ref 70–99)
Glucose-Capillary: 144 mg/dL — ABNORMAL HIGH (ref 70–99)
Glucose-Capillary: 152 mg/dL — ABNORMAL HIGH (ref 70–99)
Glucose-Capillary: 163 mg/dL — ABNORMAL HIGH (ref 70–99)
Glucose-Capillary: 183 mg/dL — ABNORMAL HIGH (ref 70–99)

## 2024-07-31 LAB — BASIC METABOLIC PANEL WITH GFR
Anion gap: 11 (ref 5–15)
BUN: 33 mg/dL — ABNORMAL HIGH (ref 8–23)
CO2: 23 mmol/L (ref 22–32)
Calcium: 7.5 mg/dL — ABNORMAL LOW (ref 8.9–10.3)
Chloride: 108 mmol/L (ref 98–111)
Creatinine, Ser: 1.73 mg/dL — ABNORMAL HIGH (ref 0.44–1.00)
GFR, Estimated: 32 mL/min — ABNORMAL LOW (ref >60)
Glucose, Bld: 155 mg/dL — ABNORMAL HIGH (ref 70–99)
Potassium: 5.2 mmol/L — ABNORMAL HIGH (ref 3.5–5.1)
Sodium: 142 mmol/L (ref 135–145)

## 2024-07-31 LAB — CBC
HCT: 22.4 % — ABNORMAL LOW (ref 36.0–46.0)
Hemoglobin: 7.4 g/dL — ABNORMAL LOW (ref 12.0–15.0)
MCH: 27.6 pg (ref 26.0–34.0)
MCHC: 33 g/dL (ref 30.0–36.0)
MCV: 83.6 fL (ref 80.0–100.0)
Platelets: 213 K/uL (ref 150–400)
RBC: 2.68 MIL/uL — ABNORMAL LOW (ref 3.87–5.11)
RDW: 17.5 % — ABNORMAL HIGH (ref 11.5–15.5)
WBC: 16.8 K/uL — ABNORMAL HIGH (ref 4.0–10.5)
nRBC: 0 % (ref 0.0–0.2)

## 2024-07-31 LAB — MAGNESIUM: Magnesium: 2 mg/dL (ref 1.7–2.4)

## 2024-07-31 LAB — PHOSPHORUS: Phosphorus: 1.8 mg/dL — ABNORMAL LOW (ref 2.5–4.6)

## 2024-07-31 MED ORDER — OSMOLITE 1.5 CAL PO LIQD
1350.0000 mL | ORAL | Status: DC
  Administered 2024-07-31 – 2024-08-02 (×3): 1350 mL via JEJUNOSTOMY
  Administered 2024-08-03: 1000 mL via JEJUNOSTOMY

## 2024-07-31 MED ORDER — FOLIC ACID 1 MG PO TABS
1.0000 mg | ORAL_TABLET | Freq: Every day | ORAL | Status: DC
  Administered 2024-08-01 – 2024-08-03 (×3): 1 mg via ORAL
  Filled 2024-07-31 (×3): qty 1

## 2024-07-31 MED ORDER — ADULT MULTIVITAMIN W/MINERALS CH
1.0000 | ORAL_TABLET | Freq: Every day | ORAL | Status: DC
  Administered 2024-08-01 – 2024-08-03 (×3): 1 via ORAL
  Filled 2024-07-31 (×3): qty 1

## 2024-07-31 MED ORDER — THIAMINE MONONITRATE 100 MG PO TABS
100.0000 mg | ORAL_TABLET | Freq: Every day | ORAL | Status: AC
  Administered 2024-08-01 – 2024-08-03 (×3): 100 mg via ORAL
  Filled 2024-07-31 (×3): qty 1

## 2024-07-31 MED ORDER — OSMOLITE 1.5 CAL PO LIQD: 1530.0000 mL | ORAL | Status: DC

## 2024-07-31 MED ORDER — COPPER 2 MG PO TABS
2.0000 mg | Freq: Every day | Status: DC
  Administered 2024-08-01 – 2024-08-03 (×3): 2 mg via ORAL
  Filled 2024-07-31 (×3): qty 1

## 2024-07-31 MED ORDER — SODIUM ZIRCONIUM CYCLOSILICATE 5 G PO PACK
5.0000 g | PACK | Freq: Once | ORAL | Status: AC
  Administered 2024-07-31: 5 g
  Filled 2024-07-31: qty 1

## 2024-07-31 MED ORDER — SODIUM PHOSPHATES 45 MMOLE/15ML IV SOLN
15.0000 mmol | Freq: Once | INTRAVENOUS | Status: AC
  Administered 2024-07-31: 15 mmol via INTRAVENOUS
  Filled 2024-07-31: qty 5

## 2024-07-31 NOTE — Progress Notes (Addendum)
 Brief Nutrition Follow-up:  Dietitian Consult received for transition to bolus feedings.   Chart reviewed. Pt being followed by Inpatient RD team, last seen on 10/3. Pt with robotic assisted lap J-tube placement on 07/22/24 by Dr. Jordis.   Tolerating CL per chart review  Discussed nutrition plan with Dr. Dezii, plan to begin transitioning to nocturnal TF as bolus feedings are not recommend via J-tube. MD agreeable to plan. Pt prefers nocturnal TF at present.   Tolerating TF via J tube per report. Osmolite 1.5 at 65 ml/hr ordered, last RD note recommended Osmolite 1.5 at 50 ml/hr. +BM yesterday  Recommend meds be given by mouth, instead of per tube, if able. Discussed with Dr. Leesa, MD plans to assess ability to transition some per tube meds by mouth  Noted Lokelma given for mild hyperkalemia today. Sodium 142 wdl), BUN 33, Creatinine 1.73  Persistent hypophosphatemia, Phosphorus 1.8 (L)- has been low for days, noted pt receiving supplementation. RD notes pt has been on Calcium  Carbonate (TUMS) while on continuous feedings; Tums is often used as a phosphorus binder when given with food/supplements. Concerned that the calcium  carbonate may be contributing to low phosphorus level. Recommend continuing to check daily with aggressive supplementation until phosphorus normalizes.  Noted Iron 21, Folic Acid  not checked, B12 551 (CRP not checked-serum B12 significantly impacted by inflammation) If concerned for iron def anemia or micronutrient-related anemia, recommend rechecking full anemia panel including folic acid  and B12 with CRP.   Noted pt receiving significant micronutrient supplementation:  MVI with Minerals tablet BID per tube Calcium  Carbonate 500 mg TID per tube Folic Acid  1mg  tablet per tube Copper  2mg  tablet per tube x 60 days Thiamine  100 mg tablet per tube x 7 days Vitamin A capsule 10,000 units orally daily Vitamin E capsule 400 units orally daily  Meds reviewed: IV reglan , SS  novolog , Pepcid  prn  Interventions:  Tube Feeding via J-tube: transition initially to 18 hour feedings Osmolite 1.5 at 75 ml/hr x 18 hours (1350 mL in 24 hours); Infuse from 1500 to 0900 every 24 hours. Continue Pro-Soure TF20 60 mL daily for now (may not be able to as outpatient) TF plus Pro-Source provides 2105 kcals, 105 g of protein and 1026 mL of free water  If tolerates increase in rate over 18 hours, plan to trial increased rate over even shorter time frame- ex: 85 ml/hr x 16 hours, 95 mL x 14 hours, 110 ml/hr over 12 hours. Discharge regimen will be dependent upon tolerance Continue free water  flush as ordered; adjustments pending ability to take fluids by mouth Micronutrient Supplementation Adjustments Reduce MVI with Minerals to daily but change to by mouth-pt now receiving >/= 100% RDA/DRI via EN. Pt may not require any vitamin supplementation long term.  Continue Thiamine  100 mg daily x 7 days but change to by mouth Continue Folic Acid  1 mg daily but change to by mouth Discontinue Calcium  Carbonate for now  Concerned contributing to persistent low phosphorus levels as likely acting like phos binder with continuous TF If Tums being utilized for GERD and need to resume, recommend resuming but ensuring that Tums not administered with meals, supplements or TF. Noted Pepcid  ordered prn, if experiencing indigestion recommend giving pepcid  Discontinue Vitamin E supplement Continue Vitamin A and Copper  for now-change to oral route; recommend rechecking values but with CRP. Plan to check ceruloplasmin instead of serum copper . Further recommendations regarding supplementation pending results Recommend considering changing per tube medications to oral route as able. Discussed with Dr. Dezii  Betsey Finger MS, RDN, LDN, CNSC Registered Dietitian 3 Clinical Nutrition RD Inpatient Contact Info in Amion

## 2024-07-31 NOTE — Progress Notes (Signed)
 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  Patient reports that she has had poor p.o. intake since cancer diagnosis.  She also endorses recurrent nausea.  She has recently met with oncology and palliative care and is planning for biopsy of the pancreatic mass.  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  Expanding metallic biliary stent was placed with improved biliary patency, external biliary stent was removed.  Stay is further complicated by ongoing AKI.  Ultimately patient elected for J-tube placement on 9/25.  Postoperative course has been complicated by large-volume vomiting.  Upper GI 9/29 showed contrast beyond jejunostomy but she continues to have trouble with p.o. and NG tube was placed which allowed for venting.  Gradually her vomiting resolved and NG tube was able to be removed on 10/3.  Patient is now tolerating J-tube feeding  Subjective: Patient worked well with physical therapy and Occupational Therapy today.  We are discussing plans for discharge home.  I again revisited hospice as an option.  Patient reports she is not ready to discontinue aggressive medical care and would like to return to the hospital as needed for acute treatment.  She is not interested in hospice at this time.  She is interested in palliative care as an adjunctive service at home.  We discussed SNF/long-term care versus home health and patient endorses desire to go home.  I also discussed this directly with her husband who is at bedside and  reports he could get the home ready for her arrival by Monday morning.  Objective: Vitals:   07/30/24 1953 07/31/24 0431 07/31/24 0500 07/31/24 0741  BP: 133/72 (!) 106/59  107/70  Pulse: 100 87  85  Resp: 18 20  16   Temp: 98.2 F (36.8 C) 98 F (36.7 C)  97.7 F (36.5 C)  TempSrc:      SpO2: 100% 100%  100%  Weight:   108 kg   Height:        Intake/Output Summary (Last 24 hours) at 07/31/2024 1449 Last data filed at 07/31/2024 1153 Gross per 24 hour  Intake 2458 ml  Output --  Net 2458 ml   Filed Weights   07/29/24 0401 07/30/24 0401 07/31/24 0500  Weight: 112 kg 107 kg 108 kg    Examination: General exam: Appears calm and comfortable, NAD  Respiratory system: No work of breathing, symmetric chest wall expansion Cardiovascular system: S1 & S2 heard, RRR.  Gastrointestinal system: Abdomen is nondistended, soft. 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 - For now plan is to discharge home with home health.  DME has been ordered.  Husband is making arrangements for her to return home on Monday.  AKI on CKD 3B - Multifactorial.  Prerenal secondary to poor p.o. intake, complicated by massive renal mass - Creatinine 0.7-1.3.  GFR 44. - Nephrology was consulted.  Patient is not a candidate for renal replacement therapy - Continue hydration via J-tube - Creatinine continues to improve very slowly.  Anticipate she is at her new  baseline.   Nausea Abdominal fullness Poor oral intake Vomiting, resolved - Secondary to pancreatic mass and large renal mass - Still on scheduled Reglan  - History of Roux-en-Y procedure, large pancreatic mass, renal mass, not a candidate for PEG tube placement. --J tube placed on 07/22/24 - She is tolerating J-tube feeding now.  RD assisting with transition to nocturnal feeds to allow for home use.  Not a candidate for bolus dosing given feeds are through jejunostomy. - Necessitated NG tube, successfully removed on 10/3.  No further vomiting.  She remains high risk for recurrence of abdominal wall/stomach bloating given nearly obstructive mass.  Will continue to monitor closely.   Hypernatremia  -Free water  deficit - Resolved now. - Continue to monitor   Malignant pancreatic mass - Presumed primary cause of all of the above - Patient previously seen [facility] oncology.  Prior biopsy revealed IPMN and patient was being monitored .  Appears she has now developed pancreatic cancer, but was still pending biopsy via EUS at time of admission. - CT scan from 8/12 reviewed: Right renal mass 11 x 9 x 9 cm, abutting the adjacent duodenum.  Mass of pancreatic head 3 x 5 cm. - Patient has recently established with oncology.  She was planning for outpatient biopsy but unfortunately with her decline in appetite, worsening kidney function, and poor functional status she would not be a candidate for any cancer therapies at this time.  She is not a surgical candidate.  Please see oncology notes from 9/22 for more detail - Patient was initially considering discharge home with hospice but has now opted for J-tube placement with hopes that this will increase her functional status. - CA 19-9 has resulted and is very elevated. - Palliative care consulted - 9/19: Cholangiogram through biliary drain, bile duct stent placement with covered metallic stent. --pt asked for code status to be changed to Full Code on  9/24 --pt asked for code status to be changed back to DNR-limited on 9/25.   --Onc palliative care following, pt is not ready for hospice.   Stage III metastatic renal cell carcinoma - Very large renal mass causing compression and upward displacement. - Previously on immunotherapy - Oncology follow-up as above --Onc palliative care following, pt is not ready for hospice.   Paroxysmal A-fib --cont Eliquis    OSA - Continue CPAP at night   COPD, no acute exacerbation   Morbid obesity, BMI 43.92   Hx of DM, not currently active --d/c'ed BG checks and SSI  DVT prophylaxis: Eliquis    Code Status: Limited: Do not attempt resuscitation (DNR) -DNR-LIMITED -Do Not Intubate/DNI  Disposition: tolerating J-tube now.  NG has been removed.  TOC assisting with HH arrangements  Consultants:  Treatment Team:  Consulting Physician: Melanee Annah BROCKS, MD  Procedures:    Antimicrobials:  Anti-infectives (From admission, onward)    Start     Dose/Rate Route Frequency Ordered Stop   07/22/24 2200  cefoTEtan  (CEFOTAN ) 2 g in sodium chloride  0.9 %  100 mL IVPB        2 g 200 mL/hr over 30 Minutes Intravenous Every 12 hours 07/22/24 1143 07/23/24 1218   07/22/24 0600  cefoTEtan  (CEFOTAN ) 2 g in sodium chloride  0.9 % 100 mL IVPB        2 g 200 mL/hr over 30 Minutes Intravenous On call to O.R. 07/21/24 0802 07/22/24 0756   07/16/24 1515  cefOXitin  (MEFOXIN ) 2 g in sodium chloride  0.9 % 100 mL IVPB        2 g 200 mL/hr over 30 Minutes Intravenous  Once 07/16/24 1422 07/16/24 1513       Data Reviewed: I have personally reviewed following labs and imaging studies CBC: Recent Labs  Lab 07/31/24 0338  WBC 16.8*  HGB 7.4*  HCT 22.4*  MCV 83.6  PLT 213   Basic Metabolic Panel: Recent Labs  Lab 07/25/24 0617 07/26/24 0344 07/27/24 0433 07/28/24 0541 07/29/24 0238 07/29/24 1954 07/30/24 0438 07/31/24 0338  NA 150*   < > 146* 145 141  --  143 142  K 3.6   < > 3.6 3.7 3.8  --  4.7 5.2*   CL 116*   < > 114* 112* 111  --  110 108  CO2 23   < > 24 25 24   --  25 23  GLUCOSE 110*   < > 118* 183* 191*  --  188* 155*  BUN 45*   < > 39* 34* 36*  --  36* 33*  CREATININE 2.32*   < > 2.02* 2.06* 2.07*  --  1.85* 1.73*  CALCIUM  8.5*   < > 8.2* 8.0* 7.8*  --  7.8* 7.5*  MG 2.4  --   --  2.6* 2.4  --  2.3 2.0  PHOS 2.4*  --   --  2.0* 1.4* 1.8* 2.6 1.8*   < > = values in this interval not displayed.   GFR: Estimated Creatinine Clearance: 34.6 mL/min (A) (by C-G formula based on SCr of 1.73 mg/dL (H)). Liver Function Tests: No results for input(s): AST, ALT, ALKPHOS, BILITOT, PROT, ALBUMIN in the last 168 hours. CBG: Recent Labs  Lab 07/30/24 2106 07/31/24 0032 07/31/24 0428 07/31/24 0739 07/31/24 1134  GLUCAP 150* 131* 144* 152* 143*    No results found for this or any previous visit (from the past 240 hours).   Radiology Studies: No results found.   Scheduled Meds:  amiodarone   200 mg Per Tube Daily   apixaban   5 mg Per Tube BID   Chlorhexidine  Gluconate Cloth  6 each Topical Q0600   [START ON 08/01/2024] copper   2 mg Oral Daily   feeding supplement (OSMOLITE 1.5 CAL)  1,350 mL Per J Tube Q24H   [START ON 08/01/2024] folic acid   1 mg Oral Daily   free water   100 mL Per Tube Q4H   insulin  aspart  0-6 Units Subcutaneous TID WC   lidocaine   10 mL Intradermal Once   metoCLOPramide  (REGLAN ) injection  5 mg Intravenous Q8H   metoprolol  tartrate  25 mg Per Tube BID   [START ON 08/01/2024] multivitamin with minerals  1 tablet Oral Daily   OLANZapine   10 mg Per Tube QHS   pantoprazole  (PROTONIX ) IV  40 mg Intravenous Q24H   sodium chloride  flush  10-40 mL Intracatheter Q12H   [START ON 08/01/2024] thiamine   100 mg Oral Daily   vitamin A  10,000 Units Oral Daily   Continuous Infusions:  sodium PHOSPHATE  IVPB (in mmol)  15 mmol (07/31/24 1151)     LOS: 15 days  MDM: Patient is high risk for one or more organ failure.  They necessitate ongoing hospitalization  for continued IV therapies and subsequent lab monitoring. Total time spent interpreting labs and vitals, reviewing the medical record, coordinating care amongst consultants and care team members, directly assessing and discussing care with the patient and/or family: 55 min Edan Juday, DO [facility]  To contact the attending physician between 7A-7P please use Epic Chat. To contact the covering physician during after hours 7P-7A, please review Amion.  07/31/2024, 2:49 PM   *This document has been created with the assistance of dictation software. Please excuse typographical errors. *

## 2024-07-31 NOTE — Progress Notes (Signed)
 PHARMACY CONSULT NOTE - ELECTROLYTES  Pharmacy Consult for Electrolyte Monitoring and Replacement   Recent Labs: Height: 5' (152.4 cm) Weight: 108 kg (238 lb 1.6 oz) IBW/kg (Calculated) : 45.5 Estimated Creatinine Clearance: 34.6 mL/min (A) (by C-G formula based on SCr of 1.73 mg/dL (H)). Potassium (mmol/L)  Date Value  07/31/2024 5.2 (H)   Magnesium  (mg/dL)  Date Value  89/95/7974 2.0   Calcium  (mg/dL)  Date Value  89/95/7974 7.5 (L)   Albumin (g/dL)  Date Value  90/76/7974 2.1 (L)   Phosphorus (mg/dL)  Date Value  89/95/7974 1.8 (L)   Sodium (mmol/L)  Date Value  07/31/2024 142   Corrected Ca: 9.32 mg/dL  Assessment  Alyssa Leonard is a 68 y.o. female presenting with AKI. PMH significant for metastatic renal cell carcinoma, A. Fib, COPD, and DM. Pharmacy has been consulted to monitor and replace electrolytes.  Diet: Clear Liquid  MIVF:  N/A  Goal of Therapy: Electrolytes WNL  Plan:  Phos 1.8, K: 5.2 Lokelma 5g X 1 ordered Sodium Phos 15mmol IV x 1 dose Check BMP, Mg, Phos with AM labs  Thank you for allowing pharmacy to be a part of this patient's care.  Estill CHRISTELLA Lutes, PharmD, BCPS Clinical Pharmacist 07/31/2024 9:50 AM

## 2024-07-31 NOTE — Evaluation (Signed)
 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  Patient reports that she has had poor p.o. intake since cancer diagnosis.  She also endorses recurrent nausea.  She has recently met with oncology and palliative care and is planning for biopsy of the pancreatic mass.  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  Expanding metallic biliary stent was placed with improved biliary patency, external biliary stent was removed.  Stay is further complicated by ongoing AKI.  Ultimately patient elected for J-tube placement on 9/25.  Postoperative course has been complicated by large-volume vomiting.  Upper GI 9/29 showed contrast beyond jejunostomy but she continues to have trouble with p.o.  NG tube was placed while continuing J tube feeds     Clinical Impressions Ms. Furniss presents this date with generalized weakness, limited endurance, and abdominal pain. Pt reports that she has been unable to attend church for ~ the previous 2-3 months, 2/2 bowel problems, pain, and weakness, and she endorses an increasing need for assistance throughout this year. She reports she now ambulates with a bariatric rollator; she also has a shower seat, a hospital bed, and a wheelchair at home, where she lives with her partner and a daughter. During today's evaluation, pt is able to come into standing w/ Min A and RW and to ambulate a short distance with fair standing balance, w/ heavy reliance on RW and with frequent standing rest breaks. She is clearly debilitated, but does not demonstrate any  unsteadiness or LOB in standing. Pt demonstrates interest in engaging in life review and reflecting on what might lie ahead for her. She expresses interest in home hospice if this could help relieve some of the burden on her family and could assist her in maximizing the quality of her remaining life. MD and TOC notified. Recommend ongoing OT while pt is hospitalized, and, should pt not enroll in home hospice care, then ongoing home-based rehab services post DC.    If plan is discharge home, recommend the following:   A little help with walking and/or transfers;A little help with bathing/dressing/bathroom;Assistance with cooking/housework;Assist for transportation     Functional Status Assessment   Patient has had a recent decline in their functional status and demonstrates the ability to make significant improvements in function in a reasonable and predictable amount of time.     Equipment Recommendations   None recommended by OT     Recommendations for Other Services   Other (comment) (Pt states she is interested in home hospice services)     Precautions/Restrictions   Precautions Precautions: Fall Restrictions Weight Bearing Restrictions Per Provider Order: No     Mobility Bed Mobility Overal bed mobility: Needs Assistance Bed Mobility: Supine to Sit     Supine to sit: Min assist          Transfers Overall transfer level: Needs assistance Equipment used: Rolling walker (2 wheels) Transfers: Sit to/from Stand Sit to Stand: Min assist           General transfer comment: would be better served with a bariatric RW, which she says she has at home      Balance  Overall balance assessment: Needs assistance Sitting-balance support: No upper extremity supported Sitting balance-Leahy Scale: Good     Standing balance support: Bilateral upper extremity supported, During functional activity, Reliant on assistive device for balance Standing balance-Leahy Scale:  Fair Standing balance comment: tires quickly, requires standing rest breaks                           ADL either performed or assessed with clinical judgement   ADL Overall ADL's : Needs assistance/impaired Eating/Feeding: Modified independent                       Toilet Transfer: Maximal assistance   Toileting- Clothing Manipulation and Hygiene: Moderate assistance       Functional mobility during ADLs: Rolling walker (2 wheels);Contact guard assist General ADL Comments: very short distance ambulation only     Vision         Perception         Praxis         Pertinent Vitals/Pain Pain Assessment Pain Assessment: No/denies pain     Extremity/Trunk Assessment Upper Extremity Assessment Upper Extremity Assessment: Generalized weakness   Lower Extremity Assessment Lower Extremity Assessment: Generalized weakness   Cervical / Trunk Assessment Cervical / Trunk Assessment: Normal   Communication Communication Communication: No apparent difficulties   Cognition Arousal: Alert Behavior During Therapy: WFL for tasks assessed/performed Cognition: No apparent impairments                                       Cueing  General Comments          Exercises Other Exercises Other Exercises: Educ re: home safety, falls prevention, PoC, DC recs   Shoulder Instructions      Home Living Family/patient expects to be discharged to:: Private residence Living Arrangements: Spouse/significant other;Children Available Help at Discharge: Family;Friend(s);Available 24 hours/day Type of Home: Apartment Home Access: Level entry     Home Layout: One level     Bathroom Shower/Tub: Chief Strategy Officer: Handicapped height     Home Equipment: Agricultural consultant (2 wheels);Cane - single point;Wheelchair - manual;Shower seat;Hospital bed          Prior Functioning/Environment Prior Level of Function : Needs assist              Mobility Comments: Ind amb with rollator limited community distances, max of 2-3 isles of a grocery store, uses store's motorized cart if it will be more than that, no fall history ADLs Comments: Needs assists with B/IADLs since cancer dx    OT Problem List: Decreased strength;Decreased activity tolerance;Impaired balance (sitting and/or standing)   OT Treatment/Interventions: Self-care/ADL training;Therapeutic exercise;Patient/family education;Balance training;Energy conservation;DME and/or AE instruction;Therapeutic activities      OT Goals(Current goals can be found in the care plan section)   Acute Rehab OT Goals Patient Stated Goal: to go home OT Goal Formulation: With patient Time For Goal Achievement: 08/14/24 Potential to Achieve Goals: Good ADL Goals Pt Will Transfer to Toilet: with min assist;ambulating;bedside commode (BSC or toilet, as pt is able, using bariatric walker) Pt Will Perform Tub/Shower Transfer: with min assist;shower seat;rolling walker Additional ADL Goal #1: Pt will complete bed mobility with Mod IND   OT Frequency:  Min 2X/week    Co-evaluation  AM-PAC OT 6 Clicks Daily Activity     Outcome Measure Help from another person eating meals?: None Help from another person taking care of personal grooming?: A Little Help from another person toileting, which includes using toliet, bedpan, or urinal?: A Lot Help from another person bathing (including washing, rinsing, drying)?: A Lot Help from another person to put on and taking off regular upper body clothing?: A Little Help from another person to put on and taking off regular lower body clothing?: A Lot 6 Click Score: 16   End of Session Equipment Utilized During Treatment: Rolling walker (2 wheels)  Activity Tolerance: Patient tolerated treatment well Patient left: in chair;with call bell/phone within reach  OT Visit Diagnosis: Unsteadiness on feet (R26.81);Muscle  weakness (generalized) (M62.81);Other abnormalities of gait and mobility (R26.89)                Time: 1040-1120 OT Time Calculation (min): 40 min Charges:  OT General Charges $OT Visit: 1 Visit OT Evaluation $OT Eval Moderate Complexity: 1 Mod OT Treatments $Self Care/Home Management : 23-37 mins Suzen Hock, PhD, MS, OTR/L 07/31/24, 12:03 PM

## 2024-07-31 NOTE — Plan of Care (Signed)
  Problem: Coping: Goal: Ability to adjust to condition or change in health will improve Outcome: Progressing   Problem: Metabolic: Goal: Ability to maintain appropriate glucose levels will improve Outcome: Progressing   Problem: Nutritional: Goal: Maintenance of adequate nutrition will improve Outcome: Progressing   Problem: Coping: Goal: Level of anxiety will decrease Outcome: Progressing

## 2024-07-31 NOTE — Plan of Care (Signed)

## 2024-07-31 NOTE — Progress Notes (Signed)
 [facility]  ROUNDING NOTE   Subjective:   Patient sitting up in chair Denies pain Tolerating clear liquids  Tube feeds at 68ml/hr Creatinine 1.73  Objective:  Vital signs in last 24 hours:  Temp:  [97.7 F (36.5 C)-98.9 F (37.2 C)] 97.7 F (36.5 C) (10/04 0741) Pulse Rate:  [85-100] 85 (10/04 0741) Resp:  [16-20] 16 (10/04 0741) BP: (106-133)/(59-83) 107/70 (10/04 0741) SpO2:  [95 %-100 %] 100 % (10/04 0741) Weight:  [108 kg] 108 kg (10/04 0500)  Weight change: 1 kg Filed Weights   07/29/24 0401 07/30/24 0401 07/31/24 0500  Weight: 112 kg 107 kg 108 kg    Intake/Output: I/O last 3 completed shifts: In: 4288.8 [I.V.:10; Other:515; NG/GT:3763.8] Out: -    Intake/Output this shift:  Total I/O In: 200 [NG/GT:200] Out: -   Physical Exam: General: NAD  Head: Normocephalic, atraumatic. Moist oral mucosal membranes  Eyes: Anicteric  Lungs:  Clear to auscultation, normal effort  Heart: Regular rate and rhythm  Abdomen:  Soft, nontender, Jtube  Extremities: No peripheral edema.  Neurologic: Awake and alert  Skin: Warm,dry, no rash   NG tube    Basic Metabolic Panel: Recent Labs  Lab 07/25/24 0617 07/26/24 0344 07/27/24 0433 07/28/24 0541 07/29/24 0238 07/29/24 1954 07/30/24 0438 07/31/24 0338  NA 150*   < > 146* 145 141  --  143 142  K 3.6   < > 3.6 3.7 3.8  --  4.7 5.2*  CL 116*   < > 114* 112* 111  --  110 108  CO2 23   < > 24 25 24   --  25 23  GLUCOSE 110*   < > 118* 183* 191*  --  188* 155*  BUN 45*   < > 39* 34* 36*  --  36* 33*  CREATININE 2.32*   < > 2.02* 2.06* 2.07*  --  1.85* 1.73*  CALCIUM  8.5*   < > 8.2* 8.0* 7.8*  --  7.8* 7.5*  MG 2.4  --   --  2.6* 2.4  --  2.3 2.0  PHOS 2.4*  --   --  2.0* 1.4* 1.8* 2.6 1.8*   < > = values in this interval not displayed.    Liver Function Tests: No results for input(s): AST, ALT, ALKPHOS, BILITOT, PROT, ALBUMIN in the last 168 hours.  No results for input(s): LIPASE,  AMYLASE in the last 168 hours. No results for input(s): AMMONIA in the last 168 hours.  CBC: Recent Labs  Lab 07/31/24 0338  WBC 16.8*  HGB 7.4*  HCT 22.4*  MCV 83.6  PLT 213     Cardiac Enzymes: No results for input(s): CKTOTAL, CKMB, CKMBINDEX, TROPONINI in the last 168 hours.  BNP: Invalid input(s): POCBNP  CBG: Recent Labs  Lab 07/30/24 2106 07/31/24 0032 07/31/24 0428 07/31/24 0739 07/31/24 1134  GLUCAP 150* 131* 144* 152* 143*    Microbiology: Results for orders placed or performed during the hospital encounter of 06/29/24  Culture, blood (x 2)     Status: None   Collection Time: 06/29/24  4:39 PM   Specimen: BLOOD  Result Value Ref Range Status   Specimen Description BLOOD LEFT ANTECUBITAL  Final   Special Requests   Final    BOTTLES DRAWN AEROBIC AND ANAEROBIC Blood Culture adequate volume   Culture   Final    NO GROWTH 5 DAYS Performed at [facility], [address] [place], [place] [address]    Report  Status 07/04/2024 FINAL  Final  Culture, blood (Routine X 2) w Reflex to ID Panel     Status: None   Collection Time: 06/29/24  7:02 PM   Specimen: BLOOD  Result Value Ref Range Status   Specimen Description BLOOD BLOOD LEFT HAND  Final   Special Requests   Final    BOTTLES DRAWN AEROBIC ONLY Blood Culture results may not be optimal due to an inadequate volume of blood received in culture bottles   Culture   Final    NO GROWTH 5 DAYS Performed at [facility], [address] [place], [place] [address]    Report Status 07/04/2024 FINAL  Final    Coagulation Studies: No results for input(s): LABPROT, INR in the last 72 hours.  Urinalysis: No results for input(s): COLORURINE, LABSPEC, PHURINE, GLUCOSEU, HGBUR, BILIRUBINUR, KETONESUR, PROTEINUR, UROBILINOGEN, NITRITE, LEUKOCYTESUR in the last 72 hours.  Invalid input(s): APPERANCEUR    Imaging: No results  found.    Medications:    feeding supplement (OSMOLITE 1.5 CAL) 1,000 mL (07/31/24 0414)   sodium PHOSPHATE  IVPB (in mmol) 15 mmol (07/31/24 1151)    amiodarone   200 mg Per Tube Daily   apixaban   5 mg Per Tube BID   calcium  carbonate  1 tablet Per Tube TID   Chlorhexidine  Gluconate Cloth  6 each Topical Q0600   copper   2 mg Per Tube Daily   folic acid   1 mg Per Tube Daily   free water   100 mL Per Tube Q4H   insulin  aspart  0-6 Units Subcutaneous TID WC   lidocaine   10 mL Intradermal Once   metoCLOPramide  (REGLAN ) injection  5 mg Intravenous Q8H   metoprolol  tartrate  25 mg Per Tube BID   multivitamin with minerals  1 tablet Per Tube BID   OLANZapine   10 mg Per Tube QHS   pantoprazole  (PROTONIX ) IV  40 mg Intravenous Q24H   sodium chloride  flush  10-40 mL Intracatheter Q12H   thiamine   100 mg Per Tube Daily   vitamin A  10,000 Units Oral Daily   vitamin E  400 Units Oral Daily   albuterol , diazepam , famotidine , meclizine , ondansetron  (ZOFRAN ) IV, oxyCODONE , senna-docusate, sodium chloride  flush  Assessment/ Plan:  Ms. KASEE HANTZ is a 68 y.o.  female with past medical history of hypertension, GERD, OSA, obesity, pAfib, renal cell carcinoma, and malignant neoplasm of the pancreas, who was admitted to [facility] on 07/13/2024 for Dehydration [E86.0] Lack of appetite [R63.0] AKI (acute kidney injury) [N17.9]   Acute kidney injury on chronic kidney disease stage IIIb. Acute kidney injury appears prerenal due to persistent poor oral intake.Baseline creatinine 1.33 with GFR 44. Due to history of renal cell carcinoma and presence of pancreatic mass, palliative care consult would be appropriate to consider goals of care  Renal function improving with hydration and nutrition.   Lab Results  Component Value Date   CREATININE 1.73 (H) 07/31/2024   CREATININE 1.85 (H) 07/30/2024   CREATININE 2.07 (H) 07/29/2024    Intake/Output Summary (Last 24 hours) at 07/31/2024 1155 Last data filed  at 07/31/2024 1153 Gross per 24 hour  Intake 2558 ml  Output --  Net 2558 ml   2. Acute metabolic acidosis, S bicarb 20 on admission. Likely secondary to kidney injury. S bicarb corrected  3. Anemia of chronic kidney disease Lab Results  Component Value Date   HGB 7.4 (L) 07/31/2024  Hgb decreased.  Avoiding ESA's due to cancer history.   4.  Hypertension with chronic kidney disease. Well controlled with metoprolol .    LOS: 15 Alyssa Leonard 10/4/202511:55 AM

## 2024-07-31 NOTE — Evaluation (Signed)
 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  Patient reports that she has had poor p.o. intake since cancer diagnosis.  She also endorses recurrent nausea.  She has recently met with oncology and palliative care and is planning for biopsy of the pancreatic mass.  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  Expanding metallic biliary stent was placed with improved biliary patency, external biliary stent was removed.  Stay is further complicated by ongoing AKI.  Ultimately patient elected for J-tube placement on 9/25.  Postoperative course has been complicated by large-volume vomiting.  Upper GI 9/29 showed contrast beyond jejunostomy but she continues to have trouble with p.o.  NG tube was placed while continuing J tube feeds   Clinical Impression  Pt received in recliner alert and oriented and agreeable to PT evaluation. She lives with her children and husband in a single story home with a threshold to enter. Prior to hospitalization, she needed assistance with ADLs and IADLs (since cancer diagnosis) and ambulated short community distances mod I with bariatric rollator. Upon PT evaluation, she completed sit to supine with min A at the legs, then needed mod A to boost up in bed. She completed sit <> stand transfers from the chair to the 3in1 [facility] positioned over the toilet to the bed with supervision and cuing for improved forwards shift and using the BRW. She ambulated 2x20 feet to/from the bathroom with BRW and supervision to CGA and stood to wash her hands in the bathroom  without LOB. She had increased work of breathing and fatigued quickly but vitals remained WFL. She needed assistance with pericare but was able to stand without LOB with support on the BRW during this time. She demonstrates a decline in functional independence and strength and requires continued PT to maximize quality of life and decrease caregiver burden. PT recommends 3in1 BSC due to limited ambulation distance and difficulty with transferring from low surface without handrails. She also needs a bariatric RW that will fit her size better and provide the stability she needs that a rollator will not. Patient would benefit from skilled physical therapy to address impairments and functional limitations (see PT Problem List below) to work towards stated goals and return to PLOF or maximal functional independence.      If plan is discharge home, recommend the following: Assistance with cooking/housework;A lot of help with walking and/or transfers;A lot of help with bathing/dressing/bathroom;Assist for transportation;Help with stairs or ramp for entrance   Can travel by private vehicle        Equipment Recommendations Rolling walker (2 wheels);BSC/3in1 (bariatric size)  Recommendations for Other Services       Functional Status Assessment Patient has had a recent decline in their functional status and demonstrates the ability to make significant improvements in function in a reasonable and predictable amount of time.     Precautions / Restrictions   Fall risk     Mobility  Bed Mobility Overal bed mobility: Needs Assistance Bed Mobility: Sit to Supine       Sit to supine: Mod assist, Min assist   General bed mobility comments: Patient needed min assistance with  moving legs up to bed, then mod assistance with scooting up the bed.    Transfers Overall transfer level: Needs assistance Equipment used: Rolling walker (2 wheels) Transfers: Sit to/from Stand Sit to Stand: Supervision,  Contact guard assist           General transfer comment: sit <> stand from chair to 3in1 positioned over toilet to bed. Needed cuing for improved forwards shift with stand from chair and cuing for RW position. Used bariatric RW well. Pt clarified she has a bariatric rollator but not bariatric RW.    Ambulation/Gait Ambulation/Gait assistance: Supervision, Contact guard assist Gait Distance (Feet): 20 Feet Assistive device: Rolling walker (2 wheels) (bariatric)   Gait velocity: Slow     General Gait Details: Patient ambulated from chair on opposite side of bed to toilet, then back to far side of bed with BRW and supervision to CGA. She demo shortness of breath and quick fatigue but vitals remained WFL. She needed cuing for BRW placement.  Stairs            Wheelchair Mobility     Tilt Bed    Modified Rankin (Stroke Patients Only)       Balance Overall balance assessment: Needs assistance Sitting-balance support: No upper extremity supported Sitting balance-Leahy Scale: Good     Standing balance support: Bilateral upper extremity supported, During functional activity, Reliant on assistive device for balance Standing balance-Leahy Scale: Fair Standing balance comment: tires quickly, able to wash hands without holding on but dependent on walker during ambulation                             Pertinent Vitals/Pain Pain Assessment Pain Assessment: 0-10 Pain Score: 6  Pain Location: abdomen Pain Descriptors / Indicators: Cramping Pain Intervention(s): Limited activity within patient's tolerance, Monitored during session, Repositioned    Home Living Family/patient expects to be discharged to:: Private residence Living Arrangements: Spouse/significant other;Children Available Help at Discharge: Family;Friend(s);Available 24 hours/day Type of Home: Apartment Home Access: Level entry       Home Layout: One level Home Equipment: Agricultural consultant (2  wheels);Cane - single point;Wheelchair - Personal assistant (4 wheels) Additional Comments: rollator is bariatric size, RW is normal size    Prior Function Prior Level of Function : Needs assist             Mobility Comments: Ind amb with rollator limited community distances, max of 2-3 isles of a grocery store, uses store's motorized cart if it will be more than that, no fall history ADLs Comments: Needs assists with B/IADLs since cancer dx     Extremity/Trunk Assessment   Upper Extremity Assessment Upper Extremity Assessment: Generalized weakness    Lower Extremity Assessment Lower Extremity Assessment: Generalized weakness    Cervical / Trunk Assessment Cervical / Trunk Assessment: Normal  Communication   Communication Communication: No apparent difficulties    Cognition Arousal: Alert Behavior During Therapy: WFL for tasks assessed/performed                             Following commands: Intact       Cueing Cueing Techniques: Verbal cues     General Comments      Exercises Other Exercises Other Exercises: pt educated about role of PT in acute care setting, discharge reccomendations. Other Exercises: Patient assisted with pericare after using the bathroom   Assessment/Plan  PT Assessment Patient needs continued PT services  PT Problem List Decreased strength;Decreased mobility;Decreased range of motion;Decreased activity tolerance;Cardiopulmonary status limiting activity;Decreased balance;Decreased knowledge of use of DME;Pain;Decreased skin integrity;Obesity       PT Treatment Interventions DME instruction;Gait training;Balance training;Therapeutic exercise;Neuromuscular re-education;Functional mobility training;Therapeutic activities;Patient/family education    PT Goals (Current goals can be found in the Care Plan section)  Acute Rehab PT Goals Patient Stated Goal: to go home PT Goal Formulation: With patient Time  For Goal Achievement: 08/14/24 Potential to Achieve Goals: Good    Frequency Min 2X/week     Co-evaluation               AM-PAC PT 6 Clicks Mobility  Outcome Measure Help needed turning from your back to your side while in a flat bed without using bedrails?: A Little Help needed moving from lying on your back to sitting on the side of a flat bed without using bedrails?: A Little Help needed moving to and from a bed to a chair (including a wheelchair)?: A Little Help needed standing up from a chair using your arms (e.g., wheelchair or bedside chair)?: A Little Help needed to walk in hospital room?: A Little Help needed climbing 3-5 steps with a railing? : Total 6 Click Score: 16    End of Session   Activity Tolerance: Patient limited by fatigue;Patient tolerated treatment well Patient left: in bed;with call bell/phone within reach;with bed alarm set Nurse Communication: Mobility status PT Visit Diagnosis: Other abnormalities of gait and mobility (R26.89);Muscle weakness (generalized) (M62.81)    Time: 8790-8754 PT Time Calculation (min) (ACUTE ONLY): 36 min   Charges:   PT Evaluation $PT Eval Moderate Complexity: 1 Mod PT Treatments $Therapeutic Activity: 8-22 mins PT General Charges $$ ACUTE PT VISIT: 1 Visit         Camie R. Juli, PT, DPT, Cert. MDT 07/31/24, 1:11 PM

## 2024-08-01 ENCOUNTER — Inpatient Hospital Stay

## 2024-08-01 DIAGNOSIS — N179 Acute kidney failure, unspecified: Secondary | ICD-10-CM | POA: Diagnosis not present

## 2024-08-01 LAB — PHOSPHORUS: Phosphorus: 2.4 mg/dL — ABNORMAL LOW (ref 2.5–4.6)

## 2024-08-01 LAB — BASIC METABOLIC PANEL WITH GFR
Anion gap: 7 (ref 5–15)
BUN: 33 mg/dL — ABNORMAL HIGH (ref 8–23)
CO2: 26 mmol/L (ref 22–32)
Calcium: 7.5 mg/dL — ABNORMAL LOW (ref 8.9–10.3)
Chloride: 109 mmol/L (ref 98–111)
Creatinine, Ser: 1.76 mg/dL — ABNORMAL HIGH (ref 0.44–1.00)
GFR, Estimated: 31 mL/min — ABNORMAL LOW (ref >60)
Glucose, Bld: 167 mg/dL — ABNORMAL HIGH (ref 70–99)
Potassium: 5.1 mmol/L (ref 3.5–5.1)
Sodium: 142 mmol/L (ref 135–145)

## 2024-08-01 LAB — CBC WITH DIFFERENTIAL/PLATELET
Abs Immature Granulocytes: 0.22 K/uL — ABNORMAL HIGH (ref 0.00–0.07)
Basophils Absolute: 0 K/uL (ref 0.0–0.1)
Basophils Relative: 0 %
Eosinophils Absolute: 0 K/uL (ref 0.0–0.5)
Eosinophils Relative: 0 %
HCT: 21.4 % — ABNORMAL LOW (ref 36.0–46.0)
Hemoglobin: 7.1 g/dL — ABNORMAL LOW (ref 12.0–15.0)
Immature Granulocytes: 2 %
Lymphocytes Relative: 11 %
Lymphs Abs: 1.5 K/uL (ref 0.7–4.0)
MCH: 28 pg (ref 26.0–34.0)
MCHC: 33.2 g/dL (ref 30.0–36.0)
MCV: 84.3 fL (ref 80.0–100.0)
Monocytes Absolute: 1.7 K/uL — ABNORMAL HIGH (ref 0.1–1.0)
Monocytes Relative: 12 %
Neutro Abs: 10.2 K/uL — ABNORMAL HIGH (ref 1.7–7.7)
Neutrophils Relative %: 75 %
Platelets: 220 K/uL (ref 150–400)
RBC: 2.54 MIL/uL — ABNORMAL LOW (ref 3.87–5.11)
RDW: 17.4 % — ABNORMAL HIGH (ref 11.5–15.5)
WBC: 13.6 K/uL — ABNORMAL HIGH (ref 4.0–10.5)
nRBC: 0 % (ref 0.0–0.2)

## 2024-08-01 LAB — GLUCOSE, CAPILLARY
Glucose-Capillary: 106 mg/dL — ABNORMAL HIGH (ref 70–99)
Glucose-Capillary: 109 mg/dL — ABNORMAL HIGH (ref 70–99)
Glucose-Capillary: 168 mg/dL — ABNORMAL HIGH (ref 70–99)
Glucose-Capillary: 172 mg/dL — ABNORMAL HIGH (ref 70–99)
Glucose-Capillary: 197 mg/dL — ABNORMAL HIGH (ref 70–99)
Glucose-Capillary: 78 mg/dL (ref 70–99)

## 2024-08-01 LAB — C-REACTIVE PROTEIN: CRP: 11.7 mg/dL — ABNORMAL HIGH (ref <1.0)

## 2024-08-01 LAB — MAGNESIUM: Magnesium: 2.3 mg/dL (ref 1.7–2.4)

## 2024-08-01 LAB — PREPARE RBC (CROSSMATCH)

## 2024-08-01 LAB — ABO/RH: ABO/RH(D): O POS

## 2024-08-01 MED ORDER — IOHEXOL 300 MG/ML  SOLN
80.0000 mL | Freq: Once | INTRAMUSCULAR | Status: AC | PRN
Start: 2024-08-01 — End: 2024-08-01
  Administered 2024-08-01: 80 mL via INTRAVENOUS

## 2024-08-01 MED ORDER — SODIUM CHLORIDE 0.9% IV SOLUTION: Freq: Once | INTRAVENOUS | Status: AC

## 2024-08-01 MED ORDER — IOHEXOL 9 MG/ML PO SOLN
500.0000 mL | ORAL | Status: AC
  Administered 2024-08-01 (×2): 500 mL via ORAL

## 2024-08-01 NOTE — Plan of Care (Signed)
  Problem: Education: Goal: Ability to describe self-care measures that may prevent or decrease complications (Diabetes Survival Skills Education) will improve Outcome: Progressing Goal: Individualized Educational Video(s) Outcome: Progressing   Problem: Coping: Goal: Ability to adjust to condition or change in health will improve Outcome: Progressing   Problem: Fluid Volume: Goal: Ability to maintain a balanced intake and output will improve Outcome: Progressing   Problem: Health Behavior/Discharge Planning: Goal: Ability to identify and utilize available resources and services will improve Outcome: Progressing Goal: Ability to manage health-related needs will improve Outcome: Progressing   Problem: Metabolic: Goal: Ability to maintain appropriate glucose levels will improve Outcome: Progressing   Problem: Nutritional: Goal: Maintenance of adequate nutrition will improve Outcome: Progressing Goal: Progress toward achieving an optimal weight will improve Outcome: Progressing   Problem: Skin Integrity: Goal: Risk for impaired skin integrity will decrease Outcome: Progressing   Problem: Tissue Perfusion: Goal: Adequacy of tissue perfusion will improve Outcome: Progressing   Problem: Education: Goal: Knowledge of General Education information will improve Description: Including pain rating scale, medication(s)/side effects and non-pharmacologic comfort measures Outcome: Progressing   Problem: Health Behavior/Discharge Planning: Goal: Ability to manage health-related needs will improve Outcome: Not Progressing   Problem: Clinical Measurements: Goal: Ability to maintain clinical measurements within normal limits will improve Outcome: Progressing Goal: Will remain free from infection Outcome: Progressing Goal: Diagnostic test results will improve Outcome: Not Progressing Goal: Respiratory complications will improve Outcome: Not Applicable Goal: Cardiovascular  complication will be avoided Outcome: Progressing   Problem: Activity: Goal: Risk for activity intolerance will decrease Outcome: Not Progressing   Problem: Nutrition: Goal: Adequate nutrition will be maintained Outcome: Progressing   Problem: Coping: Goal: Level of anxiety will decrease Outcome: Progressing   Problem: Elimination: Goal: Will not experience complications related to bowel motility Outcome: Not Progressing Goal: Will not experience complications related to urinary retention Outcome: Not Applicable   Problem: Pain Managment: Goal: General experience of comfort will improve and/or be controlled Outcome: Progressing   Problem: Safety: Goal: Ability to remain free from injury will improve Outcome: Progressing   Problem: Skin Integrity: Goal: Risk for impaired skin integrity will decrease Outcome: Progressing

## 2024-08-01 NOTE — Progress Notes (Signed)
 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  Patient reports that she has had poor p.o. intake since cancer diagnosis.  She also endorses recurrent nausea.  She has recently met with oncology and palliative care and is planning for biopsy of the pancreatic mass.  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  Expanding metallic biliary stent was placed with improved biliary patency, external biliary stent was removed.  Stay is further complicated by ongoing AKI.  Ultimately patient elected for J-tube placement on 9/25.  Postoperative course has been complicated by large-volume vomiting.  Upper GI 9/29 showed contrast beyond jejunostomy but she continues to have trouble with p.o. and NG tube was placed which allowed for venting.  Gradually her vomiting resolved and NG tube was able to be removed on 10/3.  Patient is now tolerating J-tube feeding  Subjective: Patient endorses abdominal pain this morning and she is having weeping through her prior abdominal incisions and at the J-tube site.  No purulent fluid, no bleeding.  Fluid is mostly serous.  Objective: Vitals:   07/31/24 1946 08/01/24 0437 08/01/24 0452 08/01/24 0833  BP: 130/78 (!) 103/59  122/69  Pulse: 96 91  92  Resp: 18 18  19   Temp: 98.1 F (36.7 C) (!) 97.5 F (36.4 C)  97.9 F (36.6 C)  TempSrc: Oral Oral    SpO2: 100% 100%  100%  Weight:   108 kg   Height:        Intake/Output Summary (Last 24 hours) at 08/01/2024 1438 Last data filed at 08/01/2024  1200 Gross per 24 hour  Intake 1520.11 ml  Output --  Net 1520.11 ml   Filed Weights   07/30/24 0401 07/31/24 0500 08/01/24 0452  Weight: 107 kg 108 kg 108 kg    Examination: General exam: Appears calm and comfortable, NAD  Respiratory system: No work of breathing, symmetric chest wall expansion Cardiovascular system: S1 & S2 heard, RRR.  Gastrointestinal system: Abdomen is nondistended, soft. 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 CA 19-9 elevated. Has been unable to pursue biopsy given complications and poor functional status.  Not a candidate for surgical intervention or chemotherapy due to poor functional status - History of Roux-en-Y procedure, large pancreatic mass, renal mass, not a candidate for PEG tube placement. - 9/19: Cholangiogram through biliary drain, bile duct stent placement with covered metallic stent. --J tube placed on 07/22/24 - She is tolerating J-tube feeding now.  RD assisting with transition to  nocturnal feeds to allow for home use.  Not a candidate for bolus dosing given feeds are through jejunostomy. - Necessitated NG tube, successfully removed on 10/3.  No further vomiting.  She remains high risk for recurrence of abdominal wall/stomach bloating given nearly obstructive mass.  Will continue to monitor closely. - Patient is now leaking serous fluid through her J-tube site  and prior surgical incisions.  Ultrasound obtained, no clear abscess seen at J-tube site, no significant ascites.  Will obtain CT scan and alert general surgery given this acute change.  Leukocytosis - Intermittent throughout this admission.  Complicated by active malignancy - No fever or signs or symptoms to indicate infection.  CT abdomen pelvis ordered as above  Acute blood loss anemia - Hemoglobin down to 7.1, gradually downtrending.  CT scan as above.  Will pursue transfusion today if patient consents  Poor prognosis Generalized weakness - Details on specific issues as below. - For now plan is to discharge home with home health.  DME has been ordered.  Husband is making arrangements for her to return home on Monday.  AKI on CKD 3B - Multifactorial.  Prerenal secondary to poor p.o. intake, complicated by massive renal mass - Creatinine 0.7-1.3.  GFR 44. - Nephrology was consulted.  Patient is not a candidate for renal replacement therapy - Continue hydration via J-tube - Creatinine continues to improve very slowly.  Anticipate she is at her new baseline.  Hypernatremia  -Free water  deficit - Resolved now. - Continue to monitor   Active malignancy Poor prognosis Generalized weakness -Prior to arrival had seen [facility] oncology.  Prior biopsy revealed IPMN and patient was being monitored .  Appears she has now developed pancreatic cancer, but was still pending biopsy via EUS at time of admission. - CT scan from 8/12 reviewed: Right renal mass 11 x 9 x 9 cm, abutting the adjacent duodenum.  Mass of pancreatic head 3 x 5 cm. - Planning for outpatient biopsy but with her decline in appetite, worsening kidney function, and poor functional status she would not be a candidate for any cancer therapies at this time.  She is not a surgical candidate.  Please see oncology notes from 9/22 for more detail - Patient is not ready for hospice at this time. - Pt Opted for J-tube placement in hopes that  this will increase her functional status  - Palliative care consulted and following --pt asked for code status to be changed to Full Code on 9/24 --pt asked for code status to be changed back to DNR-limited on 9/25. -- Patient is planning to discharge home with home health, doubt her ability to manage at home but she and husband remain optimistic   Stage III metastatic renal cell carcinoma - Very large renal mass causing compression and upward displacement. - Previously on immunotherapy - Oncology follow-up as above --Onc palliative care following, pt is not ready for hospice.   Paroxysmal A-fib --cont Eliquis    OSA - Continue CPAP at night   COPD, no acute exacerbation   Morbid obesity, BMI 43.92   Hx of DM, not currently active --d/c'ed BG checks and SSI  DVT prophylaxis: Eliquis    Code Status: Limited: Do not attempt resuscitation (DNR) -DNR-LIMITED -Do Not Intubate/DNI  Disposition: tolerating J-tube now.  NG has been removed. New drainage, CT ordered  Consultants:  Treatment Team:  Consulting Physician: Melanee Annah BROCKS, MD  Procedures:    Antimicrobials:  Anti-infectives (From admission, onward)    Start  Dose/Rate Route Frequency Ordered Stop   07/22/24 2200  cefoTEtan  (CEFOTAN ) 2 g in sodium chloride  0.9 % 100 mL IVPB        2 g 200 mL/hr over 30 Minutes Intravenous Every 12 hours 07/22/24 1143 07/23/24 1218   07/22/24 0600  cefoTEtan  (CEFOTAN ) 2 g in sodium chloride  0.9 % 100 mL IVPB        2 g 200 mL/hr over 30 Minutes Intravenous On call to O.R. 07/21/24 0802 07/22/24 0756   07/16/24 1515  cefOXitin  (MEFOXIN ) 2 g in sodium chloride  0.9 % 100 mL IVPB        2 g 200 mL/hr over 30 Minutes Intravenous  Once 07/16/24 1422 07/16/24 1513       Data Reviewed: I have personally reviewed following labs and imaging studies CBC: Recent Labs  Lab 07/31/24 0338 08/01/24 1251  WBC 16.8* 13.6*  NEUTROABS  --  10.2*  HGB 7.4* 7.1*  HCT 22.4* 21.4*  MCV 83.6  84.3  PLT 213 220   Basic Metabolic Panel: Recent Labs  Lab 07/28/24 0541 07/29/24 0238 07/29/24 1954 07/30/24 0438 07/31/24 0338 08/01/24 0347  NA 145 141  --  143 142 142  K 3.7 3.8  --  4.7 5.2* 5.1  CL 112* 111  --  110 108 109  CO2 25 24  --  25 23 26   GLUCOSE 183* 191*  --  188* 155* 167*  BUN 34* 36*  --  36* 33* 33*  CREATININE 2.06* 2.07*  --  1.85* 1.73* 1.76*  CALCIUM  8.0* 7.8*  --  7.8* 7.5* 7.5*  MG 2.6* 2.4  --  2.3 2.0 2.3  PHOS 2.0* 1.4* 1.8* 2.6 1.8* 2.4*   GFR: Estimated Creatinine Clearance: 34 mL/min (A) (by C-G formula based on SCr of 1.76 mg/dL (H)). Liver Function Tests: No results for input(s): AST, ALT, ALKPHOS, BILITOT, PROT, ALBUMIN in the last 168 hours. CBG: Recent Labs  Lab 07/31/24 2009 08/01/24 0004 08/01/24 0441 08/01/24 0830 08/01/24 1150  GLUCAP 183* 168* 197* 172* 109*    No results found for this or any previous visit (from the past 240 hours).   Radiology Studies: US  Abdomen Limited Result Date: 08/01/2024 CLINICAL DATA:  Leakage around jejunostomy tube. EXAM: ULTRASOUND ABDOMEN LIMITED COMPARISON:  CT abdomen pelvis 06/08/2024 FINDINGS: No evidence of abnormal fluid collection by ultrasound around an indwelling percutaneous jejunostomy tube. Large mass of the right abdomen abutting the inferior liver is consistent with a known large right renal mass. No ascites visualized in the peritoneal cavity. IMPRESSION: 1. No evidence of abnormal fluid collection around an indwelling percutaneous jejunostomy tube. 2. Large right renal mass. 3. No ascites. Electronically Signed   By: Marcey Moan M.D.   On: 08/01/2024 11:26     Scheduled Meds:  amiodarone   200 mg Per Tube Daily   apixaban   5 mg Per Tube BID   Chlorhexidine  Gluconate Cloth  6 each Topical Q0600   copper   2 mg Oral Daily   feeding supplement (OSMOLITE 1.5 CAL)  1,350 mL Per J Tube Q24H   folic acid   1 mg Oral Daily   free water   100 mL Per Tube Q4H    insulin  aspart  0-6 Units Subcutaneous TID WC   lidocaine   10 mL Intradermal Once   metoCLOPramide  (REGLAN ) injection  5 mg Intravenous Q8H   metoprolol  tartrate  25 mg Per Tube BID   multivitamin with minerals  1 tablet Oral Daily   OLANZapine   10 mg Per Tube QHS   pantoprazole  (PROTONIX ) IV  40 mg Intravenous Q24H   sodium chloride  flush  10-40 mL Intracatheter Q12H   thiamine   100 mg Oral Daily   vitamin A  10,000 Units Oral Daily   Continuous Infusions:     LOS: 16 days  MDM: Patient is high risk for one or more organ failure.  They necessitate ongoing hospitalization for continued IV therapies and subsequent lab monitoring. Total time spent interpreting labs and vitals, reviewing the medical record, coordinating care amongst consultants and care team members, directly assessing and discussing care with the patient and/or family: 55 min Kadar Chance, DO [facility]  To contact the attending physician between 7A-7P please use Epic Chat. To contact the covering physician during after hours 7P-7A, please review Amion.  08/01/2024, 2:38 PM   *This document has been created with the assistance of dictation software. Please excuse typographical errors. *

## 2024-08-01 NOTE — TOC Progression Note (Signed)
 Transition of Care [facility]) - Progression Note    Patient Details  Name: Alyssa Leonard MRN: [id number] Date of Birth: 25-Aug-1956  Transition of [facility]) CM/SW Contact  Marinda Cooks, RN Phone Number: 08/01/2024, 4:47 PM  Clinical Narrative:     Pt not medically cleared to dc New drainage, CT ordered . This CM updated that pt's current dispo is to dc Home with [facility] services . This CM coordinated HH with after speaking with [facility] via [facility] when pt is medically cleared . ROC will cont to follow dc planning / care coordination and update as applicable.                     Expected Discharge Plan and Services                                               Social Drivers of Health (SDOH) Interventions SDOH Screenings   Food Insecurity: No Food Insecurity (07/14/2024)  Housing: Low Risk  (07/14/2024)  Recent Concern: Housing - High Risk (06/29/2024)  Transportation Needs: No Transportation Needs (07/14/2024)  Utilities: Not At Risk (07/14/2024)  Depression (PHQ2-9): Low Risk  (07/13/2024)  Financial Resource Strain: Low Risk  (06/01/2024)   Received from [facility]  Social Connections: Socially Integrated (07/14/2024)  Tobacco Use: Medium Risk (07/22/2024)  Health Literacy: Medium Risk (09/03/2023)   Received from [facility]    Readmission Risk Interventions     No data to display

## 2024-08-01 NOTE — Progress Notes (Signed)
 The hospital's IV Nurse received a STAT consult for new iv access; pt needing a CT, requiring a 20ga IV;  pt has a midline in the right upper arm,which was placed in September; good bld return noted; flushed easily w NS;  both arms assessed with ultrasound; no healthy, compressible veins noted.  No attempts made; RN aware.

## 2024-08-01 NOTE — Progress Notes (Signed)
 PHARMACY CONSULT NOTE - ELECTROLYTES  Pharmacy Consult for Electrolyte Monitoring and Replacement   Recent Labs: Height: 5' (152.4 cm) Weight: 108 kg (238 lb 1.6 oz) IBW/kg (Calculated) : 45.5 Estimated Creatinine Clearance: 34 mL/min (A) (by C-G formula based on SCr of 1.76 mg/dL (H)). Potassium (mmol/L)  Date Value  08/01/2024 5.1   Magnesium  (mg/dL)  Date Value  89/94/7974 2.3   Calcium  (mg/dL)  Date Value  89/94/7974 7.5 (L)   Albumin (g/dL)  Date Value  90/76/7974 2.1 (L)   Phosphorus (mg/dL)  Date Value  89/94/7974 2.4 (L)   Sodium (mmol/L)  Date Value  08/01/2024 142   Corrected Ca: 9.02 mg/dL  Assessment  Alyssa Leonard is a 68 y.o. female presenting with AKI. PMH significant for metastatic renal cell carcinoma, A. Fib, COPD, and DM. Pharmacy has been consulted to monitor and replace electrolytes.  Diet: Clear Liquid  MIVF:  N/A  Goal of Therapy: Electrolytes WNL  Plan:  No replacement warranted at this time.  Check BMP with AM labs  Thank you for allowing pharmacy to be a part of this patient's care.  Alyssa Leonard, PharmD, BCPS Clinical Pharmacist 08/01/2024 6:21 AM

## 2024-08-01 NOTE — Plan of Care (Signed)

## 2024-08-02 DIAGNOSIS — N179 Acute kidney failure, unspecified: Secondary | ICD-10-CM | POA: Diagnosis not present

## 2024-08-02 LAB — TYPE AND SCREEN
ABO/RH(D): O POS
Antibody Screen: NEGATIVE
Unit division: 0

## 2024-08-02 LAB — CBC WITH DIFFERENTIAL/PLATELET
Abs Immature Granulocytes: 0.33 K/uL — ABNORMAL HIGH (ref 0.00–0.07)
Basophils Absolute: 0.1 K/uL (ref 0.0–0.1)
Basophils Relative: 0 %
Eosinophils Absolute: 0.2 K/uL (ref 0.0–0.5)
Eosinophils Relative: 1 %
HCT: 26.6 % — ABNORMAL LOW (ref 36.0–46.0)
Hemoglobin: 8.7 g/dL — ABNORMAL LOW (ref 12.0–15.0)
Immature Granulocytes: 2 %
Lymphocytes Relative: 10 %
Lymphs Abs: 1.6 K/uL (ref 0.7–4.0)
MCH: 27.6 pg (ref 26.0–34.0)
MCHC: 32.7 g/dL (ref 30.0–36.0)
MCV: 84.4 fL (ref 80.0–100.0)
Monocytes Absolute: 1.6 K/uL — ABNORMAL HIGH (ref 0.1–1.0)
Monocytes Relative: 10 %
Neutro Abs: 11.7 K/uL — ABNORMAL HIGH (ref 1.7–7.7)
Neutrophils Relative %: 77 %
Platelets: 240 K/uL (ref 150–400)
RBC: 3.15 MIL/uL — ABNORMAL LOW (ref 3.87–5.11)
RDW: 16.7 % — ABNORMAL HIGH (ref 11.5–15.5)
WBC: 15.4 K/uL — ABNORMAL HIGH (ref 4.0–10.5)
nRBC: 0 % (ref 0.0–0.2)

## 2024-08-02 LAB — COMPREHENSIVE METABOLIC PANEL WITH GFR
ALT: 12 U/L (ref 0–44)
AST: 29 U/L (ref 15–41)
Albumin: 1.7 g/dL — ABNORMAL LOW (ref 3.5–5.0)
Alkaline Phosphatase: 116 U/L (ref 38–126)
Anion gap: 13 (ref 5–15)
BUN: 32 mg/dL — ABNORMAL HIGH (ref 8–23)
CO2: 24 mmol/L (ref 22–32)
Calcium: 8 mg/dL — ABNORMAL LOW (ref 8.9–10.3)
Chloride: 105 mmol/L (ref 98–111)
Creatinine, Ser: 1.55 mg/dL — ABNORMAL HIGH (ref 0.44–1.00)
GFR, Estimated: 36 mL/min — ABNORMAL LOW (ref >60)
Glucose, Bld: 167 mg/dL — ABNORMAL HIGH (ref 70–99)
Potassium: 5 mmol/L (ref 3.5–5.1)
Sodium: 142 mmol/L (ref 135–145)
Total Bilirubin: 1 mg/dL (ref 0.0–1.2)
Total Protein: 5.1 g/dL — ABNORMAL LOW (ref 6.5–8.1)

## 2024-08-02 LAB — GLUCOSE, CAPILLARY
Glucose-Capillary: 144 mg/dL — ABNORMAL HIGH (ref 70–99)
Glucose-Capillary: 160 mg/dL — ABNORMAL HIGH (ref 70–99)
Glucose-Capillary: 163 mg/dL — ABNORMAL HIGH (ref 70–99)
Glucose-Capillary: 180 mg/dL — ABNORMAL HIGH (ref 70–99)
Glucose-Capillary: 80 mg/dL (ref 70–99)
Glucose-Capillary: 99 mg/dL (ref 70–99)

## 2024-08-02 LAB — BPAM RBC
Blood Product Expiration Date: 202511052359
ISSUE DATE / TIME: 202510051741
Unit Type and Rh: 5100

## 2024-08-02 LAB — MAGNESIUM: Magnesium: 2.1 mg/dL (ref 1.7–2.4)

## 2024-08-02 LAB — C-REACTIVE PROTEIN: CRP: 10.7 mg/dL — ABNORMAL HIGH (ref <1.0)

## 2024-08-02 LAB — VITAMIN B12: Vitamin B-12: 561 pg/mL (ref 180–914)

## 2024-08-02 LAB — PHOSPHORUS: Phosphorus: 2.6 mg/dL (ref 2.5–4.6)

## 2024-08-02 MED ORDER — INSULIN ASPART 100 UNIT/ML IJ SOLN: 0.0000 [IU] | Freq: Four times a day (QID) | INTRAMUSCULAR | Status: DC

## 2024-08-02 MED ORDER — INSULIN ASPART 100 UNIT/ML IJ SOLN
0.0000 [IU] | Freq: Four times a day (QID) | INTRAMUSCULAR | Status: DC
  Administered 2024-08-02 – 2024-08-03 (×3): 1 [IU] via SUBCUTANEOUS
  Filled 2024-08-02 (×3): qty 1

## 2024-08-02 NOTE — Progress Notes (Signed)
 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  Patient reports that she has had poor p.o. intake since cancer diagnosis.  She also endorses recurrent nausea.  She has recently met with oncology and palliative care and is planning for biopsy of the pancreatic mass.  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  Expanding metallic biliary stent was placed with improved biliary patency, external biliary stent was removed.  Stay is further complicated by ongoing AKI.  Ultimately patient elected for J-tube placement on 9/25.  Postoperative course has been complicated by large-volume vomiting.  Upper GI 9/29 showed contrast beyond jejunostomy but she continues to have trouble with p.o.  NG tube was placed while continuing J tube feeds    PT Comments  Pt ambulated earlier with Mobility Team, therefore focused PT session on education and assessing bed mobility for safe transition home once medically stable. Discussed at length pt's current home environment which is not handicapped accessible. No steps to enter, recommended tube transfer bench, bed rail, leg lifter and gave pt pictured options to allow for increased independent function at home. Continue to recommend HHPT. Will ask if pt needs a Bariatric Rollator vs RW prior to d/c.    If plan is discharge home, recommend the following: Assistance with cooking/housework;A lot of help with walking and/or transfers;A lot of help with bathing/dressing/bathroom;Assist for transportation;Help with stairs or ramp for entrance   Can travel  by private vehicle        Equipment Recommendations  Rolling walker (2 wheels);BSC/3in1;Rollator (4 wheels) (RW vs Rollator, Bariatric size)    Recommendations for Other Services       Precautions / Restrictions Precautions Precautions: Fall Recall of Precautions/Restrictions: Intact Precaution/Restrictions Comments: J-Tube Left lower abdomen Restrictions Weight Bearing Restrictions Per Provider Order: No     Mobility  Bed Mobility Overal bed mobility: Needs Assistance Bed Mobility: Rolling, Sidelying to Sit, Sit to Sidelying Rolling: Min assist, Mod assist (Without rails) Sidelying to sit: Mod assist (without rails)     Sit to sidelying: Min assist, Contact guard assist (With use of leg lifter and CGA) General bed mobility comments:  (Pt able to utilize gait belt as leg lifter to assist with LE management back into bed)    Transfers                   General transfer comment:  (Pt able to lift self up sitting EOB and scoot towards HOB)    Ambulation/Gait               General Gait Details:  (Pt ambulated with Mobility prior to session)   Stairs             Wheelchair Mobility     Tilt Bed    Modified Rankin (Stroke Patients Only)       Balance Overall balance assessment: Needs assistance Sitting-balance support: Feet supported, No upper extremity supported Sitting balance-Leahy Scale: Good     Standing balance support: Bilateral upper extremity supported, During functional activity, Reliant on assistive device for balance Standing balance-Leahy Scale: Fair Standing balance comment: tires quickly, able to wash hands without holding  on but dependent on walker during ambulation                            Communication Communication Communication: No apparent difficulties  Cognition Arousal: Alert Behavior During Therapy: WFL for tasks assessed/performed   PT - Cognitive impairments: No apparent impairments                        PT - Cognition Comments:  (Pleasant and cooperative) Following commands: Intact      Cueing Cueing Techniques: Verbal cues  Exercises Other Exercises Other Exercises: pt educated about role of PT in acute care setting, discharge reccomendations. Other Exercises:  (Pt and husband educated on use of leg lifter, transfer bench for showering, and options for upper bed rail to assist with bed mobility. Pictures and printed options given to pt.) Other Exercises:  (Pt educated on log roll technique to reduce Abd discomfort and importance of upright positioning during tube feedings.)    General Comments General comments (skin integrity, edema, etc.):  (J-tube intact with Abd dressing in place)      Pertinent Vitals/Pain Pain Assessment Pain Assessment: Faces Faces Pain Scale: Hurts little more Pain Location: abdomen Pain Descriptors / Indicators: Aching, Discomfort, Grimacing Pain Intervention(s): Limited activity within patient's tolerance, Other (comment) (Educated on log rolling for bed mobility)    Home Living                          Prior Function            PT Goals (current goals can now be found in the care plan section) Acute Rehab PT Goals Patient Stated Goal: to go home    Frequency    Min 2X/week      PT Plan      Co-evaluation              AM-PAC PT 6 Clicks Mobility   Outcome Measure  Help needed turning from your back to your side while in a flat bed without using bedrails?: A Little Help needed moving from lying on your back to sitting on the side of a flat bed without using bedrails?: A Little Help needed moving to and from a bed to a chair (including a wheelchair)?: A Little Help needed standing up from a chair using your arms (e.g., wheelchair or bedside chair)?: A Little Help needed to walk in hospital room?: A Little   6 Click Score: 15    End of Session   Activity Tolerance: Patient tolerated treatment  well Patient left: in bed;with call bell/phone within reach;with family/visitor present Nurse Communication: Mobility status PT Visit Diagnosis: Other abnormalities of gait and mobility (R26.89);Muscle weakness (generalized) (M62.81)     Time: 1245-1330 PT Time Calculation (min) (ACUTE ONLY): 45 min  Charges:    $Therapeutic Activity: 38-52 mins PT General Charges $$ ACUTE PT VISIT: 1 Visit                    Darice Bohr, PTA  Darice JAYSON Bohr 08/02/2024, 2:53 PM

## 2024-08-02 NOTE — Plan of Care (Signed)
  Problem: Coping: Goal: Ability to adjust to condition or change in health will improve Outcome: Progressing   Problem: Health Behavior/Discharge Planning: Goal: Ability to identify and utilize available resources and services will improve Outcome: Progressing   Problem: Metabolic: Goal: Ability to maintain appropriate glucose levels will improve Outcome: Progressing   Problem: Education: Goal: Knowledge of General Education information will improve Description: Including pain rating scale, medication(s)/side effects and non-pharmacologic comfort measures Outcome: Progressing

## 2024-08-02 NOTE — Progress Notes (Signed)
 [facility]  ROUNDING NOTE   Subjective:   Patient seen sitting up in chair Alert Denies pain  Tube feeds at 68ml/hr Creatinine 1.55  Objective:  Vital signs in last 24 hours:  Temp:  [97.7 F (36.5 C)-98.3 F (36.8 C)] 97.9 F (36.6 C) (10/06 0755) Pulse Rate:  [82-95] 89 (10/06 0755) Resp:  [16-20] 20 (10/06 0755) BP: (124-142)/(53-91) 124/82 (10/06 0755) SpO2:  [98 %-100 %] 100 % (10/06 0755) Weight:  [108 kg] 108 kg (10/06 0157)  Weight change: 0 kg Filed Weights   07/31/24 0500 08/01/24 0452 08/02/24 0157  Weight: 108 kg 108 kg 108 kg    Intake/Output: I/O last 3 completed shifts: In: 930 [P.O.:20; I.V.:10; NG/GT:900] Out: -    Intake/Output this shift:  Total I/O In: 100 [NG/GT:100] Out: -   Physical Exam: General: NAD  Head: Normocephalic, atraumatic. Moist oral mucosal membranes  Eyes: Anicteric  Lungs:  Clear to auscultation, normal effort  Heart: Regular rate and rhythm  Abdomen:  Soft, nontender, Jtube  Extremities: No peripheral edema.  Neurologic: Awake and alert  Skin: Warm,dry, no rash       Basic Metabolic Panel: Recent Labs  Lab 07/29/24 0238 07/29/24 1954 07/30/24 0438 07/31/24 0338 08/01/24 0347 08/02/24 0810  NA 141  --  143 142 142 142  K 3.8  --  4.7 5.2* 5.1 5.0  CL 111  --  110 108 109 105  CO2 24  --  25 23 26 24   GLUCOSE 191*  --  188* 155* 167* 167*  BUN 36*  --  36* 33* 33* 32*  CREATININE 2.07*  --  1.85* 1.73* 1.76* 1.55*  CALCIUM  7.8*  --  7.8* 7.5* 7.5* 8.0*  MG 2.4  --  2.3 2.0 2.3 2.1  PHOS 1.4* 1.8* 2.6 1.8* 2.4* 2.6    Liver Function Tests: Recent Labs  Lab 08/02/24 0810  AST 29  ALT 12  ALKPHOS 116  BILITOT 1.0  PROT 5.1*  ALBUMIN 1.7*    No results for input(s): LIPASE, AMYLASE in the last 168 hours. No results for input(s): AMMONIA in the last 168 hours.  CBC: Recent Labs  Lab 07/31/24 0338 08/01/24 1251 08/02/24 0810  WBC 16.8* 13.6* 15.4*  NEUTROABS  --  10.2*  11.7*  HGB 7.4* 7.1* 8.7*  HCT 22.4* 21.4* 26.6*  MCV 83.6 84.3 84.4  PLT 213 220 240     Cardiac Enzymes: No results for input(s): CKTOTAL, CKMB, CKMBINDEX, TROPONINI in the last 168 hours.  BNP: Invalid input(s): POCBNP  CBG: Recent Labs  Lab 08/01/24 1713 08/01/24 2015 08/02/24 0013 08/02/24 0424 08/02/24 0924  GLUCAP 106* 78 80 99 144*    Microbiology: Results for orders placed or performed during the hospital encounter of 06/29/24  Culture, blood (x 2)     Status: None   Collection Time: 06/29/24  4:39 PM   Specimen: BLOOD  Result Value Ref Range Status   Specimen Description BLOOD LEFT ANTECUBITAL  Final   Special Requests   Final    BOTTLES DRAWN AEROBIC AND ANAEROBIC Blood Culture adequate volume   Culture   Final    NO GROWTH 5 DAYS Performed at [facility], [address], [place], [place] [address]    Report Status 07/04/2024 FINAL  Final  Culture, blood (Routine X 2) w Reflex to ID Panel     Status: None   Collection Time: 06/29/24  7:02 PM   Specimen: BLOOD  Result Value  Ref Range Status   Specimen Description BLOOD BLOOD LEFT HAND  Final   Special Requests   Final    BOTTLES DRAWN AEROBIC ONLY Blood Culture results may not be optimal due to an inadequate volume of blood received in culture bottles   Culture   Final    NO GROWTH 5 DAYS Performed at [facility], [address], [place], [place] [address]    Report Status 07/04/2024 FINAL  Final    Coagulation Studies: No results for input(s): LABPROT, INR in the last 72 hours.  Urinalysis: No results for input(s): COLORURINE, LABSPEC, PHURINE, GLUCOSEU, HGBUR, BILIRUBINUR, KETONESUR, PROTEINUR, UROBILINOGEN, NITRITE, LEUKOCYTESUR in the last 72 hours.  Invalid input(s): APPERANCEUR    Imaging: CT ABDOMEN PELVIS W CONTRAST Result Date: 08/01/2024 EXAM: CT ABDOMEN AND PELVIS WITH CONTRAST 08/01/2024 10:34:48 PM TECHNIQUE: CT of the  abdomen and pelvis was performed with the administration of intravenous contrast, 80mL (iohexol  (OMNIPAQUE ) 300 MG/ML solution). Multiplanar reformatted images are provided for review. Automated exposure control, iterative reconstruction, and/or weight-based adjustment of the mA/kV was utilized to reduce the radiation dose to as low as reasonably achievable. COMPARISON: Comparison is made to June 08, 2024. CLINICAL HISTORY: Abdominal pain, acute, nonlocalized; Renal mass, pancreatic mass. Had J-tube laparoscopically placed. Now has new serous fluid draining from G-tube and surgical site. Ultrasound says no ascites. Eval for possible fistula versus abscess formation. FINDINGS: LOWER CHEST: Scattered ground-glass pulmonary infiltrates within the visualized left lower lobe are nonspecific but may be infectious or inflammatory in the acute setting. LIVER: The liver is otherwise unremarkable. Status post cholecystectomy. GALLBLADDER AND BILE DUCTS: Interval placement of a palliative metallic biliary stent within the common bile duct extending through the ampulla into the second portion of the duodenum. Development of extensive pneumobilia in keeping with patency of the stented segment. PANCREAS: Grossly stable mass-like density within the head and uncinate process of the pancreas measuring roughly 3.3 x 2.5 x 5.9 cm at image 33, series 2, not optimally delineated on this examination. The mass is acceptable from the third portion of the duodenum and direct invasion is difficult to exclude on this examination. No superimposed peripancreatic inflammatory changes. The pancreatic duct appears mildly dilated within the body and tail of the pancreas, similar to prior examination. KIDNEYS, URETERS AND BLADDER: The kidneys are normal in size and position. A 9.6 x 11.0 x 9.1 cm heterogeneously enhancing lobulated mass is again seen arising from the lower pole of the right kidney, most in keeping with a primary renal cell  carcinoma. Mild right hydronephrosis is again identified secondary to mass effect upon the proximal ureter by the mass. Simple cortical cyst noted within the left kidney for which no follow-up imaging is recommended. Sclerosis on the left. No intrarenal or ureteral calculi. Bladder is unremarkable. GI AND BOWEL: Surgical changes of gastric sleeve resection are again identified. Percutaneous jejunostomy had been placed in the interval within the left mid abdomen. The stomach, small bowel, and large bowel are otherwise unremarkable. Appendix is normal. No free intraperitoneal gas or fluid. VASCULATURE: Mild aortoiliac atherosclerotic calcification. LYMPH NODES: No pathologic adenopathy within the abdomen and pelvis. REPRODUCTIVE ORGANS: Status post hysterectomy. No adnexal masses. BONES AND SOFT TISSUES: Extensive subcutaneous edema within the left lateral abdominal wall, nonspecific. This may represent past edema, postsurgical change, or local inflammatory process such as cellulitis. Osseous structures are age-appropriate. No acute bone abnormality. No lytic or blastic bone lesion. IMPRESSION: 1. Grossly stable mass-like density within the head and uncinate  process of the pancreas measuring roughly 3.3 x 5.9 cm, not optimally delineated on this examination in keeping with a primary pancreatic malignancy . Direct invasion is difficult to exclude. No superimposed peripancreatic inflammatory changes. 2. 9.6 x 11.0 x 9.1 cm heterogeneously enhancing lobulated mass arising from the lower pole of the right kidney, most in keeping with a primary renal cell carcinoma, with associated mild right hydronephrosis secondary to mass effect upon the proximal ureter. 3. Extensive subcutaneous edema within the left lateral abdominal wall, nonspecific, possibly representing past edema, postsurgical change, or local inflammatory process such as cellulitis. Correlation with clinical examination is recommended. 4. Interval palliative  stenting of the extrahepatic bile duct with patency of the biliary tree through the second portion of the duodenum. 5. Interval percutaneous jejunostomy Electronically signed by: Dorethia Molt MD 08/01/2024 10:50 PM EDT RP Workstation: [id number]   US  Abdomen Limited Result Date: 08/01/2024 CLINICAL DATA:  Leakage around jejunostomy tube. EXAM: ULTRASOUND ABDOMEN LIMITED COMPARISON:  CT abdomen pelvis 06/08/2024 FINDINGS: No evidence of abnormal fluid collection by ultrasound around an indwelling percutaneous jejunostomy tube. Large mass of the right abdomen abutting the inferior liver is consistent with a known large right renal mass. No ascites visualized in the peritoneal cavity. IMPRESSION: 1. No evidence of abnormal fluid collection around an indwelling percutaneous jejunostomy tube. 2. Large right renal mass. 3. No ascites. Electronically Signed   By: Marcey Moan M.D.   On: 08/01/2024 11:26      Medications:      amiodarone   200 mg Per Tube Daily   apixaban   5 mg Per Tube BID   Chlorhexidine  Gluconate Cloth  6 each Topical Q0600   copper   2 mg Oral Daily   feeding supplement (OSMOLITE 1.5 CAL)  1,350 mL Per J Tube Q24H   folic acid   1 mg Oral Daily   free water   100 mL Per Tube Q4H   insulin  aspart  0-6 Units Subcutaneous TID WC   lidocaine   10 mL Intradermal Once   metoCLOPramide  (REGLAN ) injection  5 mg Intravenous Q8H   metoprolol  tartrate  25 mg Per Tube BID   multivitamin with minerals  1 tablet Oral Daily   OLANZapine   10 mg Per Tube QHS   pantoprazole  (PROTONIX ) IV  40 mg Intravenous Q24H   sodium chloride  flush  10-40 mL Intracatheter Q12H   thiamine   100 mg Oral Daily   vitamin A  10,000 Units Oral Daily   albuterol , diazepam , famotidine , meclizine , ondansetron  (ZOFRAN ) IV, oxyCODONE , senna-docusate, sodium chloride  flush  Assessment/ Plan:  [name] is a 68 y.o.  female with past medical history of hypertension, GERD, OSA, obesity, pAfib, renal cell  carcinoma, and malignant neoplasm of the pancreas, who was admitted to [facility] on 07/13/2024 for Dehydration [E86.0] Lack of appetite [R63.0] AKI (acute kidney injury) [N17.9]   Acute kidney injury on chronic kidney disease stage IIIb. Acute kidney injury appears prerenal due to persistent poor oral intake.Baseline creatinine 1.33 with GFR 44. Due to history of renal cell carcinoma and presence of pancreatic mass, palliative care consult would be appropriate to consider goals of care  Creatinine continues to improve. No acute need for dialysis. Will continue to monitor.   Lab Results  Component Value Date   CREATININE 1.55 (H) 08/02/2024   CREATININE 1.76 (H) 08/01/2024   CREATININE 1.73 (H) 07/31/2024    Intake/Output Summary (Last 24 hours) at 08/02/2024 1125 Last data filed at 08/02/2024 0826 Gross per 24 hour  Intake 600 ml  Output --  Net 600 ml   2. Acute metabolic acidosis, S bicarb 20 on admission. Likely secondary to kidney injury. S bicarb corrected  3. Anemia of chronic kidney disease Lab Results  Component Value Date   HGB 8.7 (L) 08/02/2024  Hgb stable Avoiding ESA's due to cancer history.   4. Hypertension with chronic kidney disease. Well controlled with metoprolol .    LOS: 17 Lesly Pontarelli 10/6/202511:25 AM

## 2024-08-02 NOTE — Progress Notes (Addendum)
 Nutrition Follow-up  DOCUMENTATION CODES:   Morbid obesity  INTERVENTION:   -Continue MVI with minerals daily -Continue 100 mg thiamine  daily x 7 days -Continue 1 mg folic acid  daily -Continue 2 mg copper  daily x 60 days  -Continue 1000 units vitamin A daily -Check vitamin A, vitamin B-12, and copper  (ceruloplasm) with CRP -Continue nocturnal feedings via j-tube:  Osmolite 1.5 at 75 ml/hr x 18 hours (1350 mL in 24 hours); Infuse from 1500 to 0900 every 24 hours. Continue Pro-Soure TF20 60 mL daily for now (may not be able to as outpatient) TF plus Pro-Source provides 2105 kcals, 105 g of protein and 1026 mL of free water  If tolerates increase in rate over 18 hours, plan to trial increased rate over even shorter time frame- ex: 85 ml/hr x 16 hours, 95 mL x 14 hours, 110 ml/hr over 12 hours. Discharge regimen will be dependent upon tolerance Continue free water  flush as ordered; adjustments pending ability to take fluids by mouth  NUTRITION DIAGNOSIS:   Inadequate oral intake related to cancer and cancer related treatments as evidenced by other (comment) (pt with duodenal mass requiring J-tube placement).  Ongoing  GOAL:   Patient will meet greater than or equal to 90% of their needs  Met with TF  MONITOR:   PO intake, Labs, Weight trends, TF tolerance, I & O's, Skin  REASON FOR ASSESSMENT:   Consult Enteral/tube feeding initiation and management  ASSESSMENT:   68 y/o female with h/o HTN, GERD, hepatitis B, s/p open cholecystectomy (1075), morbid obesity s/p laparoscopic sleeve gastrectomy (2019), DM, PAF, OSA, COPD, goiter, metastatic renal cell carcinoma on Ipilimumab (IPI) and Nivolumab (NIVO) immunotherapy at [facility], newly diagnosed pancreatic mass with biliary obstruction s/p IR drain placement 8/13 (replaced 8/26 & 9/2) and recent admission for sepsis and who is now admitted with FTT and dehydration now s/p robotic assisted laparoscopic feeding jejunostomy tube placement  (60F) 9/25.  9/19- s/p Cholangiogram through biliary drain; common bile duct stent placement with covered metallic stent  9/25- s/p j-tube placement by surgery 9/26- TF held, x-ray confirmed j-tube in good position, TF resumed 9/27- TF held secondary to vomiting 9/29- contrast study revealed no evidence of obstruction from jejunostomy although no progression of PO contrast 9/30- NGT placed for decompression (tip of tube in stomach per KUB) 10/3- NGT removed  Reviewed I/O's: +600 ml x 24 hours and +23.7 L since 07/19/24  Pt lying in bed at time of visit. Pt smiled when this RD greeted her. She denies any further nausea and vomiting. She is happy to have her NGT out and states she feels much more comfortable. She states she has had some loose bowel movements (pee and poop come out at the same time). Pt reports tolerating her TF well. RD reviewed current TF orders and discussed plan for cyclic feedings to assist with transition to home. Pt shares that she is very eager to go home when able.   Per MD notes, pt leaking serous fluid through j-tube site and prior surgical inclusions, likely related to third spacing. No clear abscess seen at j-tube site per ultrasound and CT revealed subcutaneous edema.   Per MD, remains high risk for recurrence of abdominal wall/stomach bloating given nearly obstructive mass.   Medications reviewed and include copper , folic acid , reglan , protonix , thiamine , and vitamin A.   Labs reviewed: CBGS: 78-160 (inpatient orders for glycemic control are 0-6 units insulin  aspart TID with meals).    Diet Order:   Diet Order  Diet clear liquid Room service appropriate? Yes; Fluid consistency: Thin  Diet effective now                   EDUCATION NEEDS:   No education needs have been identified at this time  Skin:  Skin Assessment: Skin Integrity Issues: Skin Integrity Issues:: Incisions Incisions: closed abdomen  Last BM:  08/02/24 (type  6)  Height:   Ht Readings from Last 1 Encounters:  07/22/24 5' (1.524 m)    Weight:   Wt Readings from Last 1 Encounters:  08/02/24 108 kg    Ideal Body Weight:  45.5 kg  BMI:  Body mass index is 46.5 kg/m.  Estimated Nutritional Needs:   Kcal:  2000-2300kcal/day  Protein:  100-115g/day  Fluid:  1.6-1.8L/day    Alyssa Leonard, RD, LDN, CDCES Registered Dietitian III Certified Diabetes Care and Education Specialist If unable to reach this RD, please use RD Inpatient group chat on secure chat between hours of 8am-4 pm daily

## 2024-08-02 NOTE — Progress Notes (Signed)
 Mobility Specialist - Progress Note   08/02/24 1240  Mobility  Activity Ambulated with assistance  Level of Assistance Standby assist, set-up cues, supervision of patient - no hands on  Assistive Device Front wheel walker;BSC;Other (Comment) (IV Stand)  Distance Ambulated (ft) 35 ft  Range of Motion/Exercises Active  Activity Response Tolerated well  Mobility visit 1 Mobility  Mobility Specialist Start Time (ACUTE ONLY) 1200  Mobility Specialist Stop Time (ACUTE ONLY) 1230  Mobility Specialist Time Calculation (min) (ACUTE ONLY) 30 min   Pt was in the recliner upon entry. Pt agreed to mobility. Pt has lines attached to her. Pt was able to ambulate with lines attached. Pt was able to STS with CGA MinA with a 2WW. Pt was able to ambulate well to and from the bathroom. Pt was able to use BSC. Nurse came in to assist. Pt did not complain about shortness of breath. After activity pt returned to recliner with needs in reach. Another nurse entered to provide medication. Chair alarm is on.  Clem Rodes Mobility Specialist 08/02/24, 12:47 PM

## 2024-08-02 NOTE — Progress Notes (Signed)
 PHARMACY CONSULT NOTE - ELECTROLYTES  Pharmacy Consult for Electrolyte Monitoring and Replacement   Recent Labs: Height: 5' (152.4 cm) Weight: 108 kg (238 lb 1.6 oz) IBW/kg (Calculated) : 45.5 Estimated Creatinine Clearance: 38.7 mL/min (A) (by C-G formula based on SCr of 1.55 mg/dL (H)). Potassium (mmol/L)  Date Value  08/02/2024 5.0   Magnesium  (mg/dL)  Date Value  89/93/7974 2.1   Calcium  (mg/dL)  Date Value  89/93/7974 8.0 (L)   Albumin (g/dL)  Date Value  89/93/7974 1.7 (L)   Phosphorus (mg/dL)  Date Value  89/93/7974 2.6   Sodium (mmol/L)  Date Value  08/02/2024 142   Corrected Ca: 9.8 mg/dL  Assessment  Alyssa Leonard is a 68 y.o. female presenting with AKI. PMH significant for metastatic renal cell carcinoma, A. Fib, COPD, and DM. Pharmacy has been consulted to monitor and replace electrolytes.  Diet: Clear Liquid  MIVF:  N/A Goal of Therapy: Electrolytes WNL  Plan:  No electrolyte replacement warranted at this time.  Check BMP, Phos with AM labs  Thank you for allowing pharmacy to be a part of this patient's care.  Will M. Lenon, PharmD, BCPS Clinical Pharmacist 08/02/2024 9:53 AM

## 2024-08-02 NOTE — Progress Notes (Signed)
 PROGRESS NOTE    Alyssa Leonard  FMW:[id number] DOB: 1956-03-08 DOA: 07/13/2024 PCP: [name] LABOR, MD  Chief Complaint  Patient presents with   abnormal labs    Hospital Course:  Alyssa Leonard 68 year old female with history of renal cell carcinoma, obstructive jaundice secondary to malignant neoplasm of the pancreas, paroxysmal A-fib on Eliquis , hypertension, OSA, GERD, morbid obesity, who presents to the ED with AKI.  Patient reports that she has had poor p.o. intake since cancer diagnosis.  She also endorses recurrent nausea.  She has recently met with oncology and palliative care and is planning for biopsy of the pancreatic mass.  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  Expanding metallic biliary stent was placed with improved biliary patency, external biliary stent was removed.  Stay is further complicated by ongoing AKI.  Ultimately patient elected for J-tube placement on 9/25.  Postoperative course has been complicated by large-volume vomiting.  Upper GI 9/29 showed contrast beyond jejunostomy but she continues to have trouble with p.o. and NG tube was placed which allowed for venting.  Gradually her vomiting resolved and NG tube was able to be removed on 10/3.  Patient is now tolerating J-tube feeding. She is having persistent leaking around J tube and prior surgical site. CT shows no evidence of abscess or fistula, most likely pt is third spacing.   Subjective: No acute events overnight. On evaluation today patient reports she is anxious to go home. We discussed that the leaking abdomen will likely require frequent dressing changes and she believes her husband can handle this at home.   Objective: Vitals:   08/01/24 2055 08/02/24 0157 08/02/24 0405 08/02/24 0755  BP: (!) 139/91  128/62 124/82  Pulse: 95  82 89  Resp:   18 20  Temp: 97.8 F (36.6 C)  97.7 F (36.5 C) 97.9 F (36.6  C)  TempSrc: Oral   Oral  SpO2: 100%  99% 100%  Weight:  108 kg    Height:        Intake/Output Summary (Last 24 hours) at 08/02/2024 1337 Last data filed at 08/02/2024 1230 Gross per 24 hour  Intake 600 ml  Output --  Net 600 ml   Filed Weights   07/31/24 0500 08/01/24 0452 08/02/24 0157  Weight: 108 kg 108 kg 108 kg    Examination: General exam: Appears calm and comfortable, NAD  Respiratory system: No work of breathing, symmetric chest wall expansion Cardiovascular system: S1 & S2 heard, RRR.  Gastrointestinal system: Abdomen is nondistended, soft. J tube in place, surrounding area is freshly bandaged and c/d/i Neuro: Alert and oriented  Assessment & Plan:  Principal Problem:   AKI (acute kidney injury) Active Problems:   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)   Mass of head of pancreas   COPD (chronic obstructive pulmonary disease) (HCC)   OSA (obstructive sleep apnea)   Paroxysmal atrial flutter (HCC)   Hypertension   Diabetes mellitus without complication (HCC)   GERD (gastroesophageal reflux disease)   Neoplasm related pain   Palliative care encounter   Dehydration      Abdominal fullness Poor oral intake Vomiting, resolved Malignant pancreatic mass - Presumed pancreatic cancer. CA 19-9 elevated. Has been unable to pursue biopsy given complications and poor functional status.  Not a candidate for surgical intervention or chemotherapy due to poor functional status - History of Roux-en-Y procedure, large pancreatic mass, renal mass, not  a candidate for PEG tube placement. - 9/19: Cholangiogram through biliary drain, bile duct stent placement with covered metallic stent. --J tube placed on 07/22/24 - She is tolerating J-tube feeding now.  RD assisting with transition to nocturnal feeds to allow for home use.  Not a candidate for bolus dosing given feeds are through jejunostomy. - Necessitated NG tube, successfully removed on 10/3.  No further  vomiting.  She remains high risk for recurrence of abdominal wall/stomach bloating given nearly obstructive mass.  Will continue to monitor closely. - Patient is now leaking serous fluid through her J-tube site and prior surgical incisions.  Ultrasound obtained, no clear abscess seen at J-tube site, no significant ascites.  CT with subcutaneous edema - likely third spacing.  Is requiring regular dressing changes.  Patient and her husband are aware of this number.  For this complexity at discharge  Leukocytosis - Intermittent throughout this admission.  Complicated by active malignancy - No fever or signs or symptoms to indicate infection.  CT abdomen pelvis doubt infectious process.  Anemia of chronic disease - Hemoglobin down to 7.1, gradually downtrending.  No acute blood loss seen on CT.  Likely complicated by kidney disease and malignancy. - 1 unit PRBC BC on 10/5.  Hemoglobin has risen expectedly - Continue to trend CBC  Poor prognosis Generalized weakness - Details on specific issues as below. - For now plan is to discharge home with home health.  DME has been ordered.  Husband is making arrangements for her to return home on Monday.  AKI on CKD 3B - Multifactorial.  Prerenal secondary to poor p.o. intake, complicated by massive renal mass - Creatinine 0.7-1.3.  GFR 44. - Nephrology was consulted.  Patient is not a candidate for renal replacement therapy - Continue hydration via J-tube - Creatinine continues to improve very slowly.  Anticipate she is at her new baseline.  Hypernatremia  -Free water  deficit - Resolved now. - Continue to monitor   Active malignancy Poor prognosis Generalized weakness -Prior to arrival had seen [facility] oncology.  Prior biopsy revealed IPMN and patient was being monitored .  Appears she has now developed pancreatic cancer, but was still pending biopsy via EUS at time of admission. - CT scan from 8/12 reviewed: Right renal mass 11 x 9 x 9 cm, abutting  the adjacent duodenum.  Mass of pancreatic head 3 x 5 cm. - Planning for outpatient biopsy but with her decline in appetite, worsening kidney function, and poor functional status she would not be a candidate for any cancer therapies at this time.  She is not a surgical candidate.  Please see oncology notes from 9/22 for more detail - Patient is not ready for hospice at this time. - Pt Opted for J-tube placement in hopes that this will increase her functional status  - Palliative care consulted and following --pt asked for code status to be changed to Full Code on 9/24 --pt asked for code status to be changed back to DNR-limited on 9/25. -- Patient is planning to discharge home with home health, doubt her ability to manage at home but she and husband remain optimistic   Stage III metastatic renal cell carcinoma - Very large renal mass causing compression and upward displacement. - Previously on immunotherapy - Oncology follow-up as above --Onc palliative care following, pt is not ready for hospice.   Paroxysmal A-fib --cont Eliquis    OSA - Continue CPAP at night   COPD, no acute exacerbation   Morbid obesity,  BMI 43.92   Hx of DM, not currently active --d/c'ed BG checks and SSI  DVT prophylaxis: Eliquis    Code Status: Limited: Do not attempt resuscitation (DNR) -DNR-LIMITED -Do Not Intubate/DNI  Disposition: tolerating J-tube now, home health arrangements being made  Consultants:  Treatment Team:  Consulting Physician: Melanee Annah BROCKS, MD  Procedures:    Antimicrobials:  Anti-infectives (From admission, onward)    Start     Dose/Rate Route Frequency Ordered Stop   07/22/24 2200  cefoTEtan  (CEFOTAN ) 2 g in sodium chloride  0.9 % 100 mL IVPB        2 g 200 mL/hr over 30 Minutes Intravenous Every 12 hours 07/22/24 1143 07/23/24 1218   07/22/24 0600  cefoTEtan  (CEFOTAN ) 2 g in sodium chloride  0.9 % 100 mL IVPB        2 g 200 mL/hr over 30 Minutes Intravenous On call to O.R.  07/21/24 0802 07/22/24 0756   07/16/24 1515  cefOXitin  (MEFOXIN ) 2 g in sodium chloride  0.9 % 100 mL IVPB        2 g 200 mL/hr over 30 Minutes Intravenous  Once 07/16/24 1422 07/16/24 1513       Data Reviewed: I have personally reviewed following labs and imaging studies CBC: Recent Labs  Lab 07/31/24 0338 08/01/24 1251 08/02/24 0810  WBC 16.8* 13.6* 15.4*  NEUTROABS  --  10.2* 11.7*  HGB 7.4* 7.1* 8.7*  HCT 22.4* 21.4* 26.6*  MCV 83.6 84.3 84.4  PLT 213 220 240   Basic Metabolic Panel: Recent Labs  Lab 07/29/24 0238 07/29/24 1954 07/30/24 0438 07/31/24 0338 08/01/24 0347 08/02/24 0810  NA 141  --  143 142 142 142  K 3.8  --  4.7 5.2* 5.1 5.0  CL 111  --  110 108 109 105  CO2 24  --  25 23 26 24   GLUCOSE 191*  --  188* 155* 167* 167*  BUN 36*  --  36* 33* 33* 32*  CREATININE 2.07*  --  1.85* 1.73* 1.76* 1.55*  CALCIUM  7.8*  --  7.8* 7.5* 7.5* 8.0*  MG 2.4  --  2.3 2.0 2.3 2.1  PHOS 1.4* 1.8* 2.6 1.8* 2.4* 2.6   GFR: Estimated Creatinine Clearance: 38.7 mL/min (A) (by C-G formula based on SCr of 1.55 mg/dL (H)). Liver Function Tests: Recent Labs  Lab 08/02/24 0810  AST 29  ALT 12  ALKPHOS 116  BILITOT 1.0  PROT 5.1*  ALBUMIN 1.7*   CBG: Recent Labs  Lab 08/01/24 2015 08/02/24 0013 08/02/24 0424 08/02/24 0924 08/02/24 1138  GLUCAP 78 80 99 144* 160*    No results found for this or any previous visit (from the past 240 hours).   Radiology Studies: CT ABDOMEN PELVIS W CONTRAST Result Date: 08/01/2024 EXAM: CT ABDOMEN AND PELVIS WITH CONTRAST 08/01/2024 10:34:48 PM TECHNIQUE: CT of the abdomen and pelvis was performed with the administration of intravenous contrast, 80mL (iohexol  (OMNIPAQUE ) 300 MG/ML solution). Multiplanar reformatted images are provided for review. Automated exposure control, iterative reconstruction, and/or weight-based adjustment of the mA/kV was utilized to reduce the radiation dose to as low as reasonably achievable. COMPARISON:  Comparison is made to June 08, 2024. CLINICAL HISTORY: Abdominal pain, acute, nonlocalized; Renal mass, pancreatic mass. Had J-tube laparoscopically placed. Now has new serous fluid draining from G-tube and surgical site. Ultrasound says no ascites. Eval for possible fistula versus abscess formation. FINDINGS: LOWER CHEST: Scattered ground-glass pulmonary infiltrates within the visualized left lower lobe are nonspecific but may be  infectious or inflammatory in the acute setting. LIVER: The liver is otherwise unremarkable. Status post cholecystectomy. GALLBLADDER AND BILE DUCTS: Interval placement of a palliative metallic biliary stent within the common bile duct extending through the ampulla into the second portion of the duodenum. Development of extensive pneumobilia in keeping with patency of the stented segment. PANCREAS: Grossly stable mass-like density within the head and uncinate process of the pancreas measuring roughly 3.3 x 2.5 x 5.9 cm at image 33, series 2, not optimally delineated on this examination. The mass is acceptable from the third portion of the duodenum and direct invasion is difficult to exclude on this examination. No superimposed peripancreatic inflammatory changes. The pancreatic duct appears mildly dilated within the body and tail of the pancreas, similar to prior examination. KIDNEYS, URETERS AND BLADDER: The kidneys are normal in size and position. A 9.6 x 11.0 x 9.1 cm heterogeneously enhancing lobulated mass is again seen arising from the lower pole of the right kidney, most in keeping with a primary renal cell carcinoma. Mild right hydronephrosis is again identified secondary to mass effect upon the proximal ureter by the mass. Simple cortical cyst noted within the left kidney for which no follow-up imaging is recommended. Sclerosis on the left. No intrarenal or ureteral calculi. Bladder is unremarkable. GI AND BOWEL: Surgical changes of gastric sleeve resection are again identified.  Percutaneous jejunostomy had been placed in the interval within the left mid abdomen. The stomach, small bowel, and large bowel are otherwise unremarkable. Appendix is normal. No free intraperitoneal gas or fluid. VASCULATURE: Mild aortoiliac atherosclerotic calcification. LYMPH NODES: No pathologic adenopathy within the abdomen and pelvis. REPRODUCTIVE ORGANS: Status post hysterectomy. No adnexal masses. BONES AND SOFT TISSUES: Extensive subcutaneous edema within the left lateral abdominal wall, nonspecific. This may represent past edema, postsurgical change, or local inflammatory process such as cellulitis. Osseous structures are age-appropriate. No acute bone abnormality. No lytic or blastic bone lesion. IMPRESSION: 1. Grossly stable mass-like density within the head and uncinate process of the pancreas measuring roughly 3.3 x 5.9 cm, not optimally delineated on this examination in keeping with a primary pancreatic malignancy . Direct invasion is difficult to exclude. No superimposed peripancreatic inflammatory changes. 2. 9.6 x 11.0 x 9.1 cm heterogeneously enhancing lobulated mass arising from the lower pole of the right kidney, most in keeping with a primary renal cell carcinoma, with associated mild right hydronephrosis secondary to mass effect upon the proximal ureter. 3. Extensive subcutaneous edema within the left lateral abdominal wall, nonspecific, possibly representing past edema, postsurgical change, or local inflammatory process such as cellulitis. Correlation with clinical examination is recommended. 4. Interval palliative stenting of the extrahepatic bile duct with patency of the biliary tree through the second portion of the duodenum. 5. Interval percutaneous jejunostomy Electronically signed by: Dorethia Molt MD 08/01/2024 10:50 PM EDT RP Workstation: [id number]   US  Abdomen Limited Result Date: 08/01/2024 CLINICAL DATA:  Leakage around jejunostomy tube. EXAM: ULTRASOUND ABDOMEN LIMITED  COMPARISON:  CT abdomen pelvis 06/08/2024 FINDINGS: No evidence of abnormal fluid collection by ultrasound around an indwelling percutaneous jejunostomy tube. Large mass of the right abdomen abutting the inferior liver is consistent with a known large right renal mass. No ascites visualized in the peritoneal cavity. IMPRESSION: 1. No evidence of abnormal fluid collection around an indwelling percutaneous jejunostomy tube. 2. Large right renal mass. 3. No ascites. Electronically Signed   By: Marcey Moan M.D.   On: 08/01/2024 11:26     Scheduled Meds:  amiodarone   200 mg Per Tube Daily   apixaban   5 mg Per Tube BID   Chlorhexidine  Gluconate Cloth  6 each Topical Q0600   copper   2 mg Oral Daily   feeding supplement (OSMOLITE 1.5 CAL)  1,350 mL Per J Tube Q24H   folic acid   1 mg Oral Daily   free water   100 mL Per Tube Q4H   insulin  aspart  0-6 Units Subcutaneous TID WC   lidocaine   10 mL Intradermal Once   metoCLOPramide  (REGLAN ) injection  5 mg Intravenous Q8H   metoprolol  tartrate  25 mg Per Tube BID   multivitamin with minerals  1 tablet Oral Daily   OLANZapine   10 mg Per Tube QHS   pantoprazole  (PROTONIX ) IV  40 mg Intravenous Q24H   sodium chloride  flush  10-40 mL Intracatheter Q12H   thiamine   100 mg Oral Daily   vitamin A  10,000 Units Oral Daily   Continuous Infusions:     LOS: 17 days  MDM: Patient is high risk for one or more organ failure.  They necessitate ongoing hospitalization for continued IV therapies and subsequent lab monitoring. Total time spent interpreting labs and vitals, reviewing the medical record, coordinating care amongst consultants and care team members, directly assessing and discussing care with the patient and/or family: 55 min Alyssa Erbe, DO [facility]  To contact the attending physician between 7A-7P please use Epic Chat. To contact the covering physician during after hours 7P-7A, please review Amion.  08/02/2024, 1:37 PM   *This  document has been created with the assistance of dictation software. Please excuse typographical errors. *

## 2024-08-02 NOTE — Plan of Care (Signed)
   Problem: Education: Goal: Ability to describe self-care measures that may prevent or decrease complications (Diabetes Survival Skills Education) will improve Outcome: Progressing

## 2024-08-03 ENCOUNTER — Other Ambulatory Visit: Payer: Self-pay

## 2024-08-03 DIAGNOSIS — G893 Neoplasm related pain (acute) (chronic): Secondary | ICD-10-CM

## 2024-08-03 DIAGNOSIS — C801 Malignant (primary) neoplasm, unspecified: Secondary | ICD-10-CM

## 2024-08-03 DIAGNOSIS — I10 Essential (primary) hypertension: Secondary | ICD-10-CM | POA: Diagnosis not present

## 2024-08-03 DIAGNOSIS — G4733 Obstructive sleep apnea (adult) (pediatric): Secondary | ICD-10-CM

## 2024-08-03 DIAGNOSIS — K219 Gastro-esophageal reflux disease without esophagitis: Secondary | ICD-10-CM

## 2024-08-03 DIAGNOSIS — J42 Unspecified chronic bronchitis: Secondary | ICD-10-CM

## 2024-08-03 DIAGNOSIS — K831 Obstruction of bile duct: Secondary | ICD-10-CM

## 2024-08-03 DIAGNOSIS — I4892 Unspecified atrial flutter: Secondary | ICD-10-CM

## 2024-08-03 DIAGNOSIS — E119 Type 2 diabetes mellitus without complications: Secondary | ICD-10-CM

## 2024-08-03 DIAGNOSIS — K8689 Other specified diseases of pancreas: Secondary | ICD-10-CM

## 2024-08-03 DIAGNOSIS — N179 Acute kidney failure, unspecified: Secondary | ICD-10-CM | POA: Diagnosis not present

## 2024-08-03 LAB — CBC WITH DIFFERENTIAL/PLATELET
Abs Immature Granulocytes: 0.23 K/uL — ABNORMAL HIGH (ref 0.00–0.07)
Basophils Absolute: 0.1 K/uL (ref 0.0–0.1)
Basophils Relative: 0 %
Eosinophils Absolute: 0.2 K/uL (ref 0.0–0.5)
Eosinophils Relative: 1 %
HCT: 25.4 % — ABNORMAL LOW (ref 36.0–46.0)
Hemoglobin: 8.4 g/dL — ABNORMAL LOW (ref 12.0–15.0)
Immature Granulocytes: 1 %
Lymphocytes Relative: 12 %
Lymphs Abs: 2.1 K/uL (ref 0.7–4.0)
MCH: 28.2 pg (ref 26.0–34.0)
MCHC: 33.1 g/dL (ref 30.0–36.0)
MCV: 85.2 fL (ref 80.0–100.0)
Monocytes Absolute: 1.8 K/uL — ABNORMAL HIGH (ref 0.1–1.0)
Monocytes Relative: 11 %
Neutro Abs: 12.5 K/uL — ABNORMAL HIGH (ref 1.7–7.7)
Neutrophils Relative %: 75 %
Platelets: 253 K/uL (ref 150–400)
RBC: 2.98 MIL/uL — ABNORMAL LOW (ref 3.87–5.11)
RDW: 16.9 % — ABNORMAL HIGH (ref 11.5–15.5)
WBC: 16.9 K/uL — ABNORMAL HIGH (ref 4.0–10.5)
nRBC: 0 % (ref 0.0–0.2)

## 2024-08-03 LAB — COMPREHENSIVE METABOLIC PANEL WITH GFR
ALT: 12 U/L (ref 0–44)
AST: 23 U/L (ref 15–41)
Albumin: 1.7 g/dL — ABNORMAL LOW (ref 3.5–5.0)
Alkaline Phosphatase: 132 U/L — ABNORMAL HIGH (ref 38–126)
Anion gap: 6 (ref 5–15)
BUN: 33 mg/dL — ABNORMAL HIGH (ref 8–23)
CO2: 26 mmol/L (ref 22–32)
Calcium: 7.9 mg/dL — ABNORMAL LOW (ref 8.9–10.3)
Chloride: 108 mmol/L (ref 98–111)
Creatinine, Ser: 1.65 mg/dL — ABNORMAL HIGH (ref 0.44–1.00)
GFR, Estimated: 34 mL/min — ABNORMAL LOW (ref >60)
Glucose, Bld: 155 mg/dL — ABNORMAL HIGH (ref 70–99)
Potassium: 5.2 mmol/L — ABNORMAL HIGH (ref 3.5–5.1)
Sodium: 140 mmol/L (ref 135–145)
Total Bilirubin: 0.7 mg/dL (ref 0.0–1.2)
Total Protein: 5.4 g/dL — ABNORMAL LOW (ref 6.5–8.1)

## 2024-08-03 LAB — GLUCOSE, CAPILLARY
Glucose-Capillary: 155 mg/dL — ABNORMAL HIGH (ref 70–99)
Glucose-Capillary: 100 mg/dL — ABNORMAL HIGH (ref 70–99)

## 2024-08-03 LAB — PHOSPHORUS: Phosphorus: 2.4 mg/dL — ABNORMAL LOW (ref 2.5–4.6)

## 2024-08-03 LAB — MAGNESIUM: Magnesium: 2.1 mg/dL (ref 1.7–2.4)

## 2024-08-03 LAB — CERULOPLASMIN: Ceruloplasmin: 16.3 mg/dL — ABNORMAL LOW (ref 19.0–39.0)

## 2024-08-03 MED ORDER — AMOXICILLIN-POT CLAVULANATE 400-57 MG/5ML PO SUSR
400.0000 mg | Freq: Two times a day (BID) | ORAL | Status: DC
  Administered 2024-08-03: 400 mg
  Filled 2024-08-03: qty 5

## 2024-08-03 MED ORDER — OXYCODONE HCL 5 MG PO TABS
5.0000 mg | ORAL_TABLET | Freq: Three times a day (TID) | ORAL | 0 refills | Status: AC | PRN
  Filled 2024-08-03: qty 30, 10d supply, fill #0

## 2024-08-03 MED ORDER — METOPROLOL TARTRATE 25 MG PO TABS
25.0000 mg | ORAL_TABLET | Freq: Two times a day (BID) | ORAL | 0 refills | Status: AC
  Filled 2024-08-03: qty 60, 30d supply, fill #0

## 2024-08-03 MED ORDER — AMOXICILLIN-POT CLAVULANATE 400-57 MG/5ML PO SUSR
400.0000 mg | Freq: Two times a day (BID) | ORAL | 0 refills | Status: AC
  Filled 2024-08-03: qty 100, 10d supply, fill #0

## 2024-08-03 MED ORDER — SODIUM ZIRCONIUM CYCLOSILICATE 5 G PO PACK
5.0000 g | PACK | Freq: Once | ORAL | Status: AC
  Administered 2024-08-03: 5 g via ORAL
  Filled 2024-08-03: qty 1

## 2024-08-03 MED ORDER — OLANZAPINE 10 MG PO TABS
10.0000 mg | ORAL_TABLET | Freq: Every day | ORAL | 0 refills | Status: AC
  Filled 2024-08-03: qty 30, 30d supply, fill #0

## 2024-08-03 MED ORDER — OSMOLITE 1.5 CAL PO LIQD: 1350.0000 mL | ORAL | Status: AC

## 2024-08-03 MED ORDER — FAMOTIDINE 20 MG PO TABS
20.0000 mg | ORAL_TABLET | Freq: Two times a day (BID) | ORAL | 0 refills | Status: AC | PRN
  Filled 2024-08-03: qty 90, 30d supply, fill #0

## 2024-08-03 NOTE — Progress Notes (Signed)
 Mobility Specialist - Progress Note   08/03/24 1300  Mobility  Activity Ambulated with assistance  Level of Assistance Contact guard assist, steadying assist  Assistive Device Front wheel walker  Distance Ambulated (ft) 25 ft  Range of Motion/Exercises Active Assistive  Activity Response Tolerated well  Mobility visit 1 Mobility  Mobility Specialist Start Time (ACUTE ONLY) 1326  Mobility Specialist Stop Time (ACUTE ONLY) 1355  Mobility Specialist Time Calculation (min) (ACUTE ONLY) 29 min   Pt was supine in bed with guest in the room upon entry. Pt agreed to mobility. Pt was able to EOB with MinA of side bars. Pt was able to STS with CGA ModA and a 2WW for stability. Pt was able to ambulate with a Bariatric 2 WW. After activity pt returned to EOB. Needs are in reach.    Clem Rodes Mobility Specialist 08/03/24, 2:05 PM

## 2024-08-03 NOTE — Plan of Care (Signed)
  Problem: Education: Goal: Ability to describe self-care measures that may prevent or decrease complications (Diabetes Survival Skills Education) will improve Outcome: Not Progressing   Problem: Coping: Goal: Ability to adjust to condition or change in health will improve Outcome: Progressing   Problem: Activity: Goal: Risk for activity intolerance will decrease Outcome: Progressing

## 2024-08-03 NOTE — TOC Progression Note (Signed)
 Transition of Care [facility]) - Progression Note    Patient Details  Name: Alyssa Leonard MRN: [id number] Date of Birth: 1956-03-07  [facility]) CM/SW Contact  Racheal LITTIE Schimke, RN Phone Number: 08/03/2024, 2:10 PM  Clinical Narrative:  CM received Tube Feeding orders from [facility], Hospitalist signed and order faxed to [phone]. Holley Herring to educate patient and husband on TF/care today 3:30 pm for discharge today. Hospitalist notified.                       Expected Discharge Plan and Services                                               Social Drivers of Health (SDOH) Interventions SDOH Screenings   Food Insecurity: No Food Insecurity (07/14/2024)  Housing: Low Risk  (07/14/2024)  Recent Concern: Housing - High Risk (06/29/2024)  Transportation Needs: No Transportation Needs (07/14/2024)  Utilities: Not At Risk (07/14/2024)  Depression (PHQ2-9): Low Risk  (07/13/2024)  Financial Resource Strain: Low Risk  (06/01/2024)   Received from [facility]  Social Connections: Socially Integrated (07/14/2024)  Tobacco Use: Medium Risk (07/22/2024)  Health Literacy: Medium Risk (09/03/2023)   Received from [facility]    Readmission Risk Interventions     No data to display

## 2024-08-03 NOTE — Plan of Care (Signed)
  Problem: Health Behavior/Discharge Planning: Goal: Ability to manage health-related needs will improve Outcome: Progressing   Problem: Metabolic: Goal: Ability to maintain appropriate glucose levels will improve Outcome: Progressing   Problem: Nutritional: Goal: Maintenance of adequate nutrition will improve Outcome: Progressing

## 2024-08-03 NOTE — Progress Notes (Signed)
 Nutrition Follow-up  DOCUMENTATION CODES:   Morbid obesity  INTERVENTION:   -Continue MVI with minerals daily -Continue 100 mg thiamine  daily x 7 days -Continue 1 mg folic acid  daily -Continue 2 mg copper  daily x 60 days  -Continue 1000 units vitamin A daily -Check vitamin A, vitamin B-12, and copper  (ceruloplasm) with CRP -Continue nocturnal feedings via j-tube:   Osmolite 1.5 at 75 ml/hr x 18 hours (1350 mL in 24 hours); Infuse from 1500 to 0900 every 24 hours. Continue Pro-Soure TF20 60 mL daily for now (may not be able to as outpatient) TF plus Pro-Source provides 2105 kcals, 105 g of protein and 1026 mL of free water  If tolerates increase in rate over 18 hours, plan to trial increased rate over even shorter time frame- ex: 85 ml/hr x 16 hours, 95 mL x 14 hours, 110 ml/hr over 12 hours. Discharge regimen will be dependent upon tolerance Continue free water  flush as ordered; adjustments pending ability to take fluids by mouth  NUTRITION DIAGNOSIS:   Inadequate oral intake related to cancer and cancer related treatments as evidenced by other (comment) (pt with duodenal mass requiring J-tube placement).  Ongoing  GOAL:   Patient will meet greater than or equal to 90% of their needs  Met with TF  MONITOR:   PO intake, Labs, Weight trends, TF tolerance, I & O's, Skin  REASON FOR ASSESSMENT:   Consult Enteral/tube feeding initiation and management  ASSESSMENT:   68 y/o female with h/o HTN, GERD, hepatitis B, s/p open cholecystectomy (1075), morbid obesity s/p laparoscopic sleeve gastrectomy (2019), DM, PAF, OSA, COPD, goiter, metastatic renal cell carcinoma on Ipilimumab (IPI) and Nivolumab (NIVO) immunotherapy at [facility], newly diagnosed pancreatic mass with biliary obstruction s/p IR drain placement 8/13 (replaced 8/26 & 9/2) and recent admission for sepsis and who is now admitted with FTT and dehydration now s/p robotic assisted laparoscopic feeding jejunostomy tube placement  (75F) 9/25.  9/19- s/p Cholangiogram through biliary drain; common bile duct stent placement with covered metallic stent  9/25- s/p j-tube placement by surgery 9/26- TF held, x-ray confirmed j-tube in good position, TF resumed 9/27- TF held secondary to vomiting 9/29- contrast study revealed no evidence of obstruction from jejunostomy although no progression of PO contrast 9/30- NGT placed for decompression (tip of tube in stomach per KUB) 10/3- NGT removed  Reviewed I/O's: +1.5 L x 24 hours and +23.7 L since 07/20/24   Spoke with pt at bedside, who was pleasant and in good spirits today. Pt smiling and reports she feels better daily. She shares that she is tolerating TF well and has had not had any further nausea and vomiting. Per pt, she had some diarrhea yesterday, but this has been improved since stool softeners were held.    RD reviewed current TF orders and discussed plan for cyclic feedings to assist with transition to home. Pt appreciative of cyclic feedings to improve quality of life. Pt shares that she is very eager to go home when able.   Per MD notes, pt leaking serous fluid through j-tube site and prior surgical inclusions, likely related to third spacing. No clear abscess seen at j-tube site per ultrasound and CT revealed subcutaneous edema.    Per MD, remains high risk for recurrence of abdominal wall/stomach bloating given nearly obstructive mass.   Pharmacy following for electrolyte management.    Wt has been stable over the past week.   Medications reviewed and include copper , folic acid , zyprexa , protonix , and vitamin A.  Labs reviewed: K: 5.2 (on lokelma), Phos: 2.4, CBGS: 80-180 (inpatient orders for glycemic control are 0-6 units insulin  aspart every 6 hours).  Vitamin B-12 WDL. CRP: 11.7. Ceruloplasm 16.3  Diet Order:   Diet Order             Diet clear liquid Room service appropriate? Yes; Fluid consistency: Thin  Diet effective now                    EDUCATION NEEDS:   No education needs have been identified at this time  Skin:  Skin Assessment: Skin Integrity Issues: Skin Integrity Issues:: Incisions Incisions: closed abdomen  Last BM:  08/03/24 (type 6)  Height:   Ht Readings from Last 1 Encounters:  07/22/24 5' (1.524 m)    Weight:   Wt Readings from Last 1 Encounters:  08/03/24 113.4 kg    Ideal Body Weight:  45.5 kg  BMI:  Body mass index is 48.82 kg/m.  Estimated Nutritional Needs:   Kcal:  2000-2300kcal/day  Protein:  100-115g/day  Fluid:  1.6-1.8L/day    Margery ORN, RD, LDN, CDCES Registered Dietitian III Certified Diabetes Care and Education Specialist If unable to reach this RD, please use RD Inpatient group chat on secure chat between hours of 8am-4 pm daily

## 2024-08-03 NOTE — Progress Notes (Signed)
 PHARMACY CONSULT NOTE - ELECTROLYTES  Pharmacy Consult for Electrolyte Monitoring and Replacement   Recent Labs: Height: 5' (152.4 cm) Weight: 113.4 kg (250 lb) IBW/kg (Calculated) : 45.5 Estimated Creatinine Clearance: 37.5 mL/min (A) (by C-G formula based on SCr of 1.65 mg/dL (H)). Potassium (mmol/L)  Date Value  08/03/2024 5.2 (H)   Magnesium  (mg/dL)  Date Value  89/92/7974 2.1   Calcium  (mg/dL)  Date Value  89/92/7974 7.9 (L)   Albumin (g/dL)  Date Value  89/92/7974 1.7 (L)   Phosphorus (mg/dL)  Date Value  89/92/7974 2.4 (L)   Sodium (mmol/L)  Date Value  08/03/2024 140   Corrected Ca: 9.8 mg/dL  Assessment  Alyssa Leonard is a 68 y.o. female presenting with AKI. PMH significant for metastatic renal cell carcinoma, A. Fib, COPD, and DM. Pharmacy has been consulted to monitor and replace electrolytes.  Diet: Clear Liquid  MIVF:  N/A Goal of Therapy: Electrolytes WNL  Plan:  K 5.2: Give Lokelma 5g x1 Check BMP, Phos with AM labs  Thank you for involving pharmacy in this patient's care.   Damien Napoleon, PharmD Clinical Pharmacist 08/03/2024 7:28 AM

## 2024-08-03 NOTE — Discharge Summary (Signed)
 DISCHARGE SUMMARY    Alyssa Leonard FMW:[id number] DOB: 05-13-1956 DOA: 07/13/2024  PCP: [name] LABOR, MD  Admit date: 07/13/2024 Discharge date: 08/03/2024   Recommendations for Outpatient Follow-up:  Continue to follow closely with primary care at discharge.  Palliative care to follow.   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  Patient reports that she has had poor p.o. intake since cancer diagnosis.  She also endorses recurrent nausea.  She has recently met with oncology and palliative care and is planning for biopsy of the pancreatic mass.  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  Expanding metallic biliary stent was placed with improved biliary patency, external biliary stent was removed.  Stay is further complicated by ongoing AKI.  Ultimately patient elected for J-tube placement on 9/25.  Postoperative course has been complicated by large-volume vomiting.  Upper GI 9/29 showed contrast beyond jejunostomy but she continues to have trouble with p.o. and NG tube was placed which allowed for venting.  Gradually her vomiting resolved and NG tube was able to be removed on 10/3.  Patient is now tolerating J-tube feeding. She is having persistent leaking around J tube and prior surgical site. CT shows no evidence of abscess or fistula, most likely pt is third spacing.  Augmentin  also added for early signs of cellulitis surrounding J-tube site. Unfortunately patient prognosis remains poor.  We have offered SNF vs hospice vs home with [facility] and patient and her husband would like to try to discharge home with home health.  Bedside teaching was performed to ensure patient and husband  comfortability with J-tube feeding at home.  Patient will continue with p.o. intake as desired for comfort.  Palliative care will follow the patient outpatient.  Abdominal fullness Poor oral intake Vomiting, resolved Malignant pancreatic mass - Presumed pancreatic cancer. CA 19-9 elevated. Has been unable to pursue biopsy given complications and poor functional status.  Not a candidate for surgical intervention or chemotherapy due to poor functional status - History of Roux-en-Y procedure, large pancreatic mass, renal mass, not a candidate for PEG tube placement. - 9/19: Cholangiogram through biliary drain, bile duct stent placement with covered metallic stent. --J tube placed on 07/22/24 - She is tolerating J-tube feeding now.  Transition to nocturnal feeds for home use.  Not a candidate for bolus feeding given feeds are through jejunostomy. - Necessitated NG tube, successfully removed on 10/3.  No further vomiting.  She remains high risk for recurrence of abdominal wall/stomach bloating given nearly obstructive mass.   - Patient has leaking of serous fluid through her J-tube site and prior surgical incision.  Ultrasound and CT obtained with no evidence of abscess or significant ascites.  This is likely third spacing.  She requires dressing changes at least twice a day.  Patient and her husband are aware of this and are ready to change dressings at home. - Some early cellulitic changes surrounding the J-tube today, warmth, skin becoming taut.  Have added Augmentin  for 5 days for J-tube   Leukocytosis - Intermittent throughout this admission.  Complicated by active malignancy - No fever or signs or symptoms to indicate infection.  CT abdomen pelvis doubt infectious process.   Anemia of chronic disease - Hemoglobin down to 7.1, gradually downtrending.  No acute blood loss seen on CT.  Likely complicated by kidney disease and malignancy. - 1 unit PRBC BC on 10/5.  Hemoglobin has risen expectedly    Poor prognosis Generalized weakness - Patient is discharging home with home health.  Her husband will act as primary caregiver and palliative care will continue to follow.  Patient can transition to hospice outpatient when she is ready.   AKI on CKD 3B - Multifactorial.  Prerenal secondary to poor p.o. intake, complicated by massive renal mass - Creatinine 0.7-1.3.  GFR 44. - Nephrology was consulted.  Patient is not a candidate for renal replacement therapy - Continue hydration via J-tube - Creatinine continues to improve very slowly.  Anticipate she is at her new baseline.   Hypernatremia  -Free water  deficit - Resolved now. - Continue to monitor   Active malignancy -Prior to arrival had seen [facility] oncology.  Prior biopsy revealed IPMN and patient was being monitored .  Appears she has now developed pancreatic cancer, but was still pending biopsy via EUS at time of admission. - CT scan from 8/12 reviewed: Right renal mass 11 x 9 x 9 cm, abutting the adjacent duodenum.  Mass of pancreatic head 3 x 5 cm. - Planning for outpatient biopsy but with her decline in appetite, worsening kidney function, and poor functional status she would not be a candidate for any cancer therapies at this time.  She is not a surgical candidate.  Please see oncology notes from 9/22 for more detail - Patient is not ready for hospice at this time. - Pt Opted for J-tube placement in hopes that this will increase her functional status  - Palliative care consulted and following --pt asked for code status to be changed to Full Code on 9/24 --pt asked for code status to be changed back to DNR-limited on 9/25. -- Patient is planning to discharge home with home health, doubt her ability to manage at home but she and husband remain optimistic   Stage III metastatic renal cell carcinoma - Very large renal mass causing compression and upward displacement. - Previously on immunotherapy - Oncology follow-up as above --Onc  palliative care following, pt is not ready for hospice.   Paroxysmal A-fib --cont Eliquis    OSA - Continue CPAP at night   COPD, no acute exacerbation   Morbid obesity, BMI 43.92   Hx of DM, not currently active --d/c'ed BG checks and SSI  Discharge Instructions  Discharge Instructions     Call MD for:  difficulty breathing, headache or visual disturbances   Complete by: As directed    Call MD for:  persistant dizziness or light-headedness   Complete by: As directed    Call MD for:  persistant nausea and vomiting   Complete by: As directed    Call MD for:  severe uncontrolled pain   Complete by: As directed    Call MD for:  temperature >100.4   Complete by: As directed    Diet general   Complete by: As directed    Discharge instructions   Complete by: As directed    Please follow-up closely with your primary care physician and home health team for ongoing chronic medication management   Increase activity slowly   Complete by: As directed    No wound care   Complete by: As directed       Allergies as of 08/03/2024       Reactions   Latex Other (See Comments), Dermatitis, Hives, Swelling  unknown Other reaction(s): Other (See Comments)  unknown Other reaction(s): Other (See Comments) unknown    unknown   Aspirin Other (See Comments), Nausea And Vomiting   Makes stomach burn Other reaction(s): Other (See Comments)  Makes stomach burn Makes stomach burn    Other reaction(s): Other (See Comments) Makes stomach burn   Plasticized Base [plastibase]         Medication List     STOP taking these medications    acetaminophen  500 MG tablet Commonly known as: TYLENOL    HYDROmorphone  2 MG tablet Commonly known as: DILAUDID    latanoprost  0.005 % ophthalmic solution Commonly known as: XALATAN    loperamide  1 MG/5ML solution Commonly known as: IMODIUM    losartan  25 MG tablet Commonly known as: COZAAR    losartan -hydrochlorothiazide  50-12.5 MG  tablet Commonly known as: HYZAAR   meclizine  25 MG tablet Commonly known as: ANTIVERT    mirtazapine  7.5 MG tablet Commonly known as: REMERON    omeprazole 20 MG Tbdd disintegrating tablet   ondansetron  4 MG tablet Commonly known as: ZOFRAN    potassium chloride  10 MEQ tablet Commonly known as: KLOR-CON    Testosterone 12.5 MG/ACT (1%) Gel       TAKE these medications    albuterol  (2.5 MG/3ML) 0.083% nebulizer solution Commonly known as: PROVENTIL  Take 2.5 mg by nebulization every 4 (four) hours as needed for wheezing or shortness of breath. What changed: Another medication with the same name was removed. Continue taking this medication, and follow the directions you see here.   amiodarone  200 MG tablet Commonly known as: PACERONE  Take 1 tablet (200 mg total) by mouth daily.   amoxicillin -clavulanate 400-57 MG/5ML suspension Commonly known as: AUGMENTIN  Place 5 mLs (400 mg total) into feeding tube every 12 (twelve) hours for 5 days. Discard remainder.   diazepam  2 MG tablet Commonly known as: VALIUM  Take 1 tablet (2 mg total) by mouth 2 (two) times daily as needed for anxiety. Home med.   dibucaine 1 % Oint Commonly known as: NUPERCAINAL Place 1 Application rectally as needed for hemorrhoids.   dorzolamide  2 % ophthalmic solution Commonly known as: TRUSOPT  Place 1 drop into both eyes 2 (two) times daily.   Eliquis  5 MG Tabs tablet Generic drug: apixaban  Take 1 tablet (5 mg total) by mouth 2 (two) times daily.   famotidine  20 MG tablet Commonly known as: PEPCID  Place 1 tablet (20 mg total) into feeding tube 3 times/day as needed-between meals & bedtime for heartburn or indigestion. What changed:  medication strength how much to take how to take this when to take this reasons to take this   feeding supplement (OSMOLITE 1.5 CAL) Liqd 1,350 mLs by Per J Tube route daily. Start taking on: August 04, 2024 What changed:  how much to take how to take this when  to take this   metoprolol  tartrate 25 MG tablet Commonly known as: LOPRESSOR  Place 1 tablet (25 mg total) into feeding tube 2 (two) times daily. What changed:  how much to take how to take this   OLANZapine  10 MG tablet Commonly known as: ZYPREXA  Place 1 tablet (10 mg total) into feeding tube at bedtime.   oxyCODONE  5 MG immediate release tablet Commonly known as: Oxy IR/ROXICODONE  Place 1 tablet (5 mg total) into feeding tube every 8 (eight) hours as needed for up to 14 days for moderate pain (pain score 4-6). What changed: how to take this   sodium chloride  flush 0.9 % Soln injection Flush biliary cathter with 5-10mL saline daily to  prevent clogging               Durable Medical Equipment  (From admission, onward)           Start     Ordered   07/23/24 1220  For home use only DME Tube feeding pump  Once       Question:  Length of Need  Answer:  Lifetime   07/23/24 1219   07/23/24 1145  For home use only DME Hospital bed  Once       Question Answer Comment  Length of Need Lifetime   Bed type Semi-electric      07/23/24 1144   07/23/24 0905  For home use only DME Shower stool  Once        07/23/24 0904   07/23/24 0904  For home use only DME Bedside commode  Once       Question:  Patient needs a bedside commode to treat with the following condition  Answer:  Weakness   07/23/24 0903   07/23/24 0903  For home use only DME standard manual wheelchair with seat cushion  Once        07/23/24 0903            Allergies  Allergen Reactions   Latex Other (See Comments), Dermatitis, Hives and Swelling    unknown  Other reaction(s): Other (See Comments)  unknown  Other reaction(s): Other (See Comments) unknown    unknown   Aspirin Other (See Comments) and Nausea And Vomiting    Makes stomach burn  Other reaction(s): Other (See Comments)  Makes stomach burn  Makes stomach burn    Other reaction(s): Other (See Comments) Makes stomach burn    Plasticized Base [Plastibase]     Consultations: Treatment Team:  Melanee Annah BROCKS, MD   Procedures/Studies: CT ABDOMEN PELVIS W CONTRAST Result Date: 08/01/2024 EXAM: CT ABDOMEN AND PELVIS WITH CONTRAST 08/01/2024 10:34:48 PM TECHNIQUE: CT of the abdomen and pelvis was performed with the administration of intravenous contrast, 80mL (iohexol  (OMNIPAQUE ) 300 MG/ML solution). Multiplanar reformatted images are provided for review. Automated exposure control, iterative reconstruction, and/or weight-based adjustment of the mA/kV was utilized to reduce the radiation dose to as low as reasonably achievable. COMPARISON: Comparison is made to June 08, 2024. CLINICAL HISTORY: Abdominal pain, acute, nonlocalized; Renal mass, pancreatic mass. Had J-tube laparoscopically placed. Now has new serous fluid draining from G-tube and surgical site. Ultrasound says no ascites. Eval for possible fistula versus abscess formation. FINDINGS: LOWER CHEST: Scattered ground-glass pulmonary infiltrates within the visualized left lower lobe are nonspecific but may be infectious or inflammatory in the acute setting. LIVER: The liver is otherwise unremarkable. Status post cholecystectomy. GALLBLADDER AND BILE DUCTS: Interval placement of a palliative metallic biliary stent within the common bile duct extending through the ampulla into the second portion of the duodenum. Development of extensive pneumobilia in keeping with patency of the stented segment. PANCREAS: Grossly stable mass-like density within the head and uncinate process of the pancreas measuring roughly 3.3 x 2.5 x 5.9 cm at image 33, series 2, not optimally delineated on this examination. The mass is acceptable from the third portion of the duodenum and direct invasion is difficult to exclude on this examination. No superimposed peripancreatic inflammatory changes. The pancreatic duct appears mildly dilated within the body and tail of the pancreas, similar to prior  examination. KIDNEYS, URETERS AND BLADDER: The kidneys are normal in size and position. A 9.6 x 11.0 x 9.1 cm heterogeneously  enhancing lobulated mass is again seen arising from the lower pole of the right kidney, most in keeping with a primary renal cell carcinoma. Mild right hydronephrosis is again identified secondary to mass effect upon the proximal ureter by the mass. Simple cortical cyst noted within the left kidney for which no follow-up imaging is recommended. Sclerosis on the left. No intrarenal or ureteral calculi. Bladder is unremarkable. GI AND BOWEL: Surgical changes of gastric sleeve resection are again identified. Percutaneous jejunostomy had been placed in the interval within the left mid abdomen. The stomach, small bowel, and large bowel are otherwise unremarkable. Appendix is normal. No free intraperitoneal gas or fluid. VASCULATURE: Mild aortoiliac atherosclerotic calcification. LYMPH NODES: No pathologic adenopathy within the abdomen and pelvis. REPRODUCTIVE ORGANS: Status post hysterectomy. No adnexal masses. BONES AND SOFT TISSUES: Extensive subcutaneous edema within the left lateral abdominal wall, nonspecific. This may represent past edema, postsurgical change, or local inflammatory process such as cellulitis. Osseous structures are age-appropriate. No acute bone abnormality. No lytic or blastic bone lesion. IMPRESSION: 1. Grossly stable mass-like density within the head and uncinate process of the pancreas measuring roughly 3.3 x 5.9 cm, not optimally delineated on this examination in keeping with a primary pancreatic malignancy . Direct invasion is difficult to exclude. No superimposed peripancreatic inflammatory changes. 2. 9.6 x 11.0 x 9.1 cm heterogeneously enhancing lobulated mass arising from the lower pole of the right kidney, most in keeping with a primary renal cell carcinoma, with associated mild right hydronephrosis secondary to mass effect upon the proximal ureter. 3. Extensive  subcutaneous edema within the left lateral abdominal wall, nonspecific, possibly representing past edema, postsurgical change, or local inflammatory process such as cellulitis. Correlation with clinical examination is recommended. 4. Interval palliative stenting of the extrahepatic bile duct with patency of the biliary tree through the second portion of the duodenum. 5. Interval percutaneous jejunostomy Electronically signed by: Dorethia Molt MD 08/01/2024 10:50 PM EDT RP Workstation: [id number]   US  Abdomen Limited Result Date: 08/01/2024 CLINICAL DATA:  Leakage around jejunostomy tube. EXAM: ULTRASOUND ABDOMEN LIMITED COMPARISON:  CT abdomen pelvis 06/08/2024 FINDINGS: No evidence of abnormal fluid collection by ultrasound around an indwelling percutaneous jejunostomy tube. Large mass of the right abdomen abutting the inferior liver is consistent with a known large right renal mass. No ascites visualized in the peritoneal cavity. IMPRESSION: 1. No evidence of abnormal fluid collection around an indwelling percutaneous jejunostomy tube. 2. Large right renal mass. 3. No ascites. Electronically Signed   By: Marcey Moan M.D.   On: 08/01/2024 11:26   DG Abd 1 View Result Date: 07/27/2024 CLINICAL DATA:  NG tube placement. EXAM: ABDOMEN - 1 VIEW COMPARISON:  Chest x-ray 07/27/2024 FINDINGS: Nasogastric tube is present with tip over the stomach in the left upper quadrant. Multiple surgical clips over the left mid to upper abdomen. Stent over the midline of the mid abdomen. Bowel gas pattern is nonobstructive. Remainder the exam is unchanged. IMPRESSION: 1. Nonobstructive bowel gas pattern. 2. Nasogastric tube with tip over the stomach in the left upper quadrant. Electronically Signed   By: Toribio Agreste M.D.   On: 07/27/2024 17:31   DG ABD ACUTE 2+V W 1V CHEST Result Date: 07/27/2024 EXAM: UPRIGHT AND SUPINE XRAY VIEWS OF THE ABDOMEN AND 4 VIEW(S) OF THE CHEST 07/27/2024 08:00:00 AM COMPARISON: 07/23/2024  CLINICAL HISTORY: Ileus following gastrointestinal surgery FINDINGS: LUNGS AND PLEURA: Low lung volumes. No consolidation or pulmonary edema. No pleural effusion or pneumothorax. HEART AND MEDIASTINUM: No acute abnormality  of the cardiac and mediastinal silhouettes. BOWEL: Nonobstructive bowel gas pattern. Contrast throughout the colon. PERITONEUM AND SOFT TISSUES: Biliary stent is present. Multiple surgical clips in the left upper quadrant. Vascular stent is present. No abnormal calcifications. No free air. BONES: No acute osseous abnormality. IMPRESSION: 1. No radiographic bowel obstruction or free air. 2. Low lung volumes. Electronically signed by: Donnice Mania MD 07/27/2024 11:09 AM EDT RP Workstation: [id number]   DG UGI W SINGLE CM (SOL OR THIN BA) Result Date: 07/26/2024 CLINICAL DATA:  68 year old female with history of RCC and pancreatic mass with CBD obstruction status post CBD stent placement and jejunostomy tube placement. Patient now with intolerance of jejunal tube feeds and emesis with p.o. intake. EXAM: DG UGI W SINGLE CM TECHNIQUE: Single contrast examination was then performed using thin liquid barium. This exam was performed by Carlin Griffon, PA-C, and was supervised and interpreted by Harrietta Sherry, MD. FLUOROSCOPY: Radiation Exposure Index (as provided by the fluoroscopic device): 122.60 mGy Kerma COMPARISON:  None Available. FINDINGS: Esophagus:  Patulous esophagus. Esophageal motility: Severe dysmotility and delayed emptying into the gastric lumen, requiring table tilt at 25 degrees for passage of contrast past the LES. Gastroesophageal reflux: Spontaneous and frank reflux noted in prone position. Patient did vomit despite table tilt. Ingested 13mm barium tablet: Not given Stomach: Normal appearance. No hiatal hernia. Gastric emptying: Complete absence of contrast past gastric pylorus after 20 minutes following administration of 100 cc of PO contrast. Patient ultimately had an episode of  emesis. Duodenum:  Not opacified.  Unable to assess. Other: 50 cc of contrast administered via the jejunostomy tube. Normal opacification of mid to distal small bowel loops. Patient endorsed uncomfortable pressure upon contrast administration. Study was severely limited by patient's body habitus and severe dizziness with attempts to rotate. Study performed with 25 degrees table tilt. IMPRESSION: 1. Normal opacification of small bowel distal to jejunostomy tube. Patient endorsed sensation of discomfort and pressure. 2. Severe esophageal dysmotility and gastric stasis with gastroesophageal reflux and subsequent emesis of administered oral contrast. Electronically Signed   By: Harrietta Sherry M.D.   On: 07/26/2024 16:29   DG ABD ACUTE 2+V W 1V CHEST Result Date: 07/23/2024 CLINICAL DATA:  Ileus following gastrointestinal surgery. EXAM: DG ABDOMEN ACUTE WITH 1 VIEW CHEST COMPARISON:  Chest radiographs 03/30/2024. Abdominal radiographs 06/30/2024. Abdominopelvic CT 06/08/2024. FINDINGS: Low lung volumes. The heart size is stable at the upper limits of normal. The mediastinal contours are normal. The lungs appear clear. No pleural effusion or pneumothorax. A metallic biliary stent has been placed without obvious pneumobilia. There is a normal, nonobstructive bowel gas pattern. Multiple surgical clips are present in the left upper quadrant of the abdomen and in the pelvis. No evidence of pneumoperitoneum or acute osseous abnormality. Lumbar facet arthropathy noted. IMPRESSION: No evidence of acute cardiopulmonary or abdominal process. Interval biliary stent placement without obvious pneumobilia. Electronically Signed   By: Elsie Perone M.D.   On: 07/23/2024 15:10   IR BILIARY STENT(S) EXIST ACCESS INC DILAT CATH EXCH/REMOVAL Result Date: 07/16/2024 INDICATION: Common bile duct obstruction secondary to pancreatic mass and status post previous placement of internal/external biliary drain. Bilirubin has now  normalized and the patient presents for possible internalization of biliary stenting via percutaneous approach. EXAM: BILIARY STENT PLACEMENT VIA PRE-EXISTING INTERNAL/EXTERNAL DRAIN APPROACH INCLUDING CHOLANGIOGRAM MEDICATIONS: 2 g IV cefoxitin ; The antibiotic was administered within an appropriate time frame prior to the initiation of the procedure. ANESTHESIA/SEDATION: Moderate (conscious) sedation was employed during this procedure. A  total of Versed  3.0 mg and Fentanyl  125 mcg was administered intravenously by the radiology nurse. Total intra-service moderate Sedation Time: 51 minutes. The patient's level of consciousness and vital signs were monitored continuously by radiology nursing throughout the procedure under my direct supervision. FLUOROSCOPY: Radiation Exposure Index (as provided by the fluoroscopic device): 469 mGy Kerma COMPLICATIONS: None immediate. PROCEDURE: Informed written consent was obtained from the patient after a thorough discussion of the procedural risks, benefits and alternatives. All questions were addressed. Maximal Sterile Barrier Technique was utilized including caps, mask, sterile gowns, sterile gloves, sterile drape, hand hygiene and skin antiseptic. A timeout was performed prior to the initiation of the procedure. Initial cholangiogram was performed through the pre-existing internal/external biliary drainage catheter. A radiopaque stent guided ruler had been placed along the patient's back in order to guide measurement assessment for stent placement. The drain was then cut and removed over a guidewire. A 7 French sheath was advanced over the wire to the level of the duodenum. Additional contrast and air was injected to outline the duodenal lumen. The sheath was retracted across the common bile duct in order to define length of common bile duct stricture/obstruction. A 10 mm x 60 mm Wallflex covered biliary stent prosthesis was chosen for placement. The covered stent deployment  system was advanced over a guidewire to the level of the duodenum and retracted into appropriate position. The stent was then deployed across the common bile duct. After stent deployment, the stent lumen was additionally dilated with an 8 mm x 40 mm Athletis balloon. Balloon dilatation was performed in 2 different segments in order to cover the stented segment. The balloon was deflated and removed. Additional cholangiogram was then performed through the 7 French sheath positioned at the confluence of right and left intrahepatic bile ducts as well as in the right intrahepatic bile ducts. Percutaneous right-sided biliary access was then removed with removal of guidewire and sheath. A gauze dressing was applied over the percutaneous access site. 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 Intrahepatic bile ducts are relatively decompressed. Based on length of estimated stricture, a 10 mm x 60 mm covered self expanding biliary stent was chosen for placement. After stent deployment, due to persistent narrowing of the stent in its midportion, the stent was further post dilated to 8 mm resulting in improved patency. The stent is well positioned extending from just below the biliary confluence of right and left intrahepatic ducts to the level of the duodenum. Excellent antegrade flow is documented through the stent after placement which allowed complete removal of percutaneous access on completion. 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 The covered stent was additionally post dilated with an 8 mm balloon and demonstrates excellent patency extending from just below the biliary confluence to the duodenum with widely patent flow demonstrated after stent  placement. Percutaneous biliary access was removed upon completion of the procedure. Electronically Signed   By: Marcey Moan M.D.   On: 07/16/2024 16:02      Discharge Exam: Vitals:   08/03/24 0744 08/03/24 1455  BP: (!) 160/62 121/62  Pulse: 83 87  Resp: 16 16  Temp: 98.4 F (36.9 C) 97.9 F (36.6 C)  SpO2: 95% 100%   Vitals:   08/03/24 0440 08/03/24 0740 08/03/24 0744 08/03/24 1455  BP:  (!) 109/54 (!) 160/62 121/62  Pulse:  88 83 87  Resp:  16 16 16   Temp:  97.9 F (36.6 C) 98.4 F (36.9 C) 97.9 F (36.6 C)  TempSrc:      SpO2:  100% 95% 100%  Weight: 113.4 kg     Height:        General exam: Appears calm and comfortable, NAD  Respiratory system: No work of breathing, symmetric chest wall expansion Cardiovascular system: S1 & S2 heard, RRR.  Gastrointestinal system: Abdomen is obese, nondistended, soft. J tube in place, surrounding area is freshly bandaged and c/d/I, some warmth to most lateral skin by J-tube, no significant erythema or tenderness, no fluctuance or induration, skin is more taut in this area Neuro: Alert and oriented     The results of significant diagnostics from this hospitalization (including imaging, microbiology, ancillary and laboratory) are listed below for reference.     Microbiology: No results found for this or any previous visit (from the past 240 hours).   Labs: BNP (last 3 results) No results for input(s): BNP in the last 8760 hours. Basic Metabolic Panel: Recent Labs  Lab 07/30/24 0438 07/31/24 0338 08/01/24 0347 08/02/24 0810 08/03/24 0436  NA 143 142 142 142 140  K 4.7 5.2* 5.1 5.0 5.2*  CL 110 108 109 105 108  CO2 25 23 26 24 26   GLUCOSE 188* 155* 167* 167* 155*  BUN 36* 33* 33* 32* 33*  CREATININE 1.85* 1.73* 1.76* 1.55* 1.65*  CALCIUM  7.8* 7.5* 7.5* 8.0* 7.9*  MG 2.3 2.0 2.3 2.1 2.1  PHOS 2.6 1.8* 2.4* 2.6 2.4*   Liver Function Tests: Recent Labs  Lab 08/02/24 0810 08/03/24 0436  AST 29 23  ALT 12 12   ALKPHOS 116 132*  BILITOT 1.0 0.7  PROT 5.1* 5.4*  ALBUMIN 1.7* 1.7*   No results for input(s): LIPASE, AMYLASE in the last 168 hours. No results for input(s): AMMONIA in the last 168 hours. CBC: Recent Labs  Lab 07/31/24 0338 08/01/24 1251 08/02/24 0810 08/03/24 0436  WBC 16.8* 13.6* 15.4* 16.9*  NEUTROABS  --  10.2* 11.7* 12.5*  HGB 7.4* 7.1* 8.7* 8.4*  HCT 22.4* 21.4* 26.6* 25.4*  MCV 83.6 84.3 84.4 85.2  PLT 213 220 240 253   Cardiac Enzymes: No results for input(s): CKTOTAL, CKMB, CKMBINDEX, TROPONINI in the last 168 hours. BNP: Invalid input(s): POCBNP CBG: Recent Labs  Lab 08/02/24 1138 08/02/24 1737 08/02/24 2356 08/03/24 0539 08/03/24 1126  GLUCAP 160* 163* 180* 155* 100*   D-Dimer No results for input(s): DDIMER in the last 72 hours. Hgb A1c No results for input(s): HGBA1C in the last 72 hours. Lipid Profile No results for input(s): CHOL, HDL, LDLCALC, TRIG, CHOLHDL, LDLDIRECT in the last 72 hours. Thyroid  function studies No results for input(s): TSH, T4TOTAL, T3FREE, THYROIDAB in the last 72 hours.  Invalid input(s): FREET3 Anemia work up Recent Labs    08/02/24 1450  VITAMINB12 561   Urinalysis    Component Value Date/Time   COLORURINE AMBER (A) 06/30/2024 0522   APPEARANCEUR HAZY (A) 06/30/2024 0522   LABSPEC 1.016 06/30/2024 0522   PHURINE 5.0 06/30/2024 0522   GLUCOSEU NEGATIVE 06/30/2024 0522   HGBUR NEGATIVE 06/30/2024 0522   BILIRUBINUR NEGATIVE 06/30/2024 0522   KETONESUR 5 (A) 06/30/2024 0522   PROTEINUR NEGATIVE 06/30/2024 0522   NITRITE NEGATIVE 06/30/2024 0522   LEUKOCYTESUR NEGATIVE 06/30/2024 0522   Sepsis Labs Recent Labs  Lab 07/31/24 0338 08/01/24 1251  08/02/24 0810 08/03/24 0436  WBC 16.8* 13.6* 15.4* 16.9*   Microbiology No results found for this or any previous visit (from the past 240 hours).   Time coordinating discharge: 32 min   SIGNED: Larisha Vencill,  DO [facility] 08/03/2024, 4:35 PM Pager   If 7PM-7AM, please contact night-coverage

## 2024-08-03 NOTE — Progress Notes (Incomplete)
 Assessed patient's knowledge of about administering tube feeding. Patient denies husband or daughter have received any formal education about administering tube feeding. Patient appeared disinterested in learning how to set up/ administer tube feeding. Expressed to patient the importance of receiving tube feeding

## 2024-08-04 LAB — CERULOPLASMIN: Ceruloplasmin: 18 mg/dL — ABNORMAL LOW (ref 19.0–39.0)

## 2024-08-04 LAB — VITAMIN A
Vitamin A (Retinoic Acid): 6.3 ug/dL — ABNORMAL LOW (ref 22.0–69.5)
Vitamin A (Retinoic Acid): 6.9 ug/dL — ABNORMAL LOW (ref 22.0–69.5)

## 2024-08-11 ENCOUNTER — Telehealth: Payer: Self-pay | Admitting: Hospice and Palliative Medicine

## 2024-08-11 NOTE — Telephone Encounter (Signed)
 I spoke with patient's husband.  He says the patient is doing about the same as in the hospital.  He denies any significant changes or concerns.  He says she is mostly bedbound.  Tolerating tube feeds.  No significant nausea or pain.  Husband is interested in patient seeing Dr. Melanee but was unclear if he could get her here.  He did request that we proceed with scheduling an appointment.

## 2024-08-16 ENCOUNTER — Inpatient Hospital Stay: Attending: Oncology | Admitting: Oncology

## 2024-08-16 ENCOUNTER — Encounter: Payer: Self-pay | Admitting: Oncology

## 2024-08-20 ENCOUNTER — Encounter: Payer: Self-pay | Admitting: Intensive Care

## 2024-08-20 ENCOUNTER — Emergency Department

## 2024-08-20 ENCOUNTER — Inpatient Hospital Stay
Admission: EM | Admit: 2024-08-20 | Discharge: 2024-08-24 | DRG: 394 | Disposition: A | Discharge status: Home Health | Attending: Internal Medicine | Admitting: Internal Medicine

## 2024-08-20 ENCOUNTER — Other Ambulatory Visit: Payer: Self-pay

## 2024-08-20 DIAGNOSIS — J4489 Other specified chronic obstructive pulmonary disease: Secondary | ICD-10-CM | POA: Diagnosis present

## 2024-08-20 DIAGNOSIS — N179 Acute kidney failure, unspecified: Secondary | ICD-10-CM | POA: Diagnosis present

## 2024-08-20 DIAGNOSIS — N170 Acute kidney failure with tubular necrosis: Secondary | ICD-10-CM

## 2024-08-20 DIAGNOSIS — Z9049 Acquired absence of other specified parts of digestive tract: Secondary | ICD-10-CM

## 2024-08-20 DIAGNOSIS — D638 Anemia in other chronic diseases classified elsewhere: Secondary | ICD-10-CM

## 2024-08-20 DIAGNOSIS — E66813 Obesity, class 3: Secondary | ICD-10-CM | POA: Diagnosis present

## 2024-08-20 DIAGNOSIS — Z6841 Body Mass Index (BMI) 40.0 and over, adult: Secondary | ICD-10-CM

## 2024-08-20 DIAGNOSIS — Z886 Allergy status to analgesic agent status: Secondary | ICD-10-CM

## 2024-08-20 DIAGNOSIS — Z9884 Bariatric surgery status: Secondary | ICD-10-CM

## 2024-08-20 DIAGNOSIS — G4733 Obstructive sleep apnea (adult) (pediatric): Secondary | ICD-10-CM | POA: Diagnosis present

## 2024-08-20 DIAGNOSIS — I10 Essential (primary) hypertension: Secondary | ICD-10-CM | POA: Diagnosis present

## 2024-08-20 DIAGNOSIS — E119 Type 2 diabetes mellitus without complications: Secondary | ICD-10-CM

## 2024-08-20 DIAGNOSIS — Z85528 Personal history of other malignant neoplasm of kidney: Secondary | ICD-10-CM

## 2024-08-20 DIAGNOSIS — Z91040 Latex allergy status: Secondary | ICD-10-CM

## 2024-08-20 DIAGNOSIS — E870 Hyperosmolality and hypernatremia: Secondary | ICD-10-CM | POA: Diagnosis not present

## 2024-08-20 DIAGNOSIS — L089 Local infection of the skin and subcutaneous tissue, unspecified: Secondary | ICD-10-CM

## 2024-08-20 DIAGNOSIS — K9423 Gastrostomy malfunction: Secondary | ICD-10-CM | POA: Diagnosis present

## 2024-08-20 DIAGNOSIS — I129 Hypertensive chronic kidney disease with stage 1 through stage 4 chronic kidney disease, or unspecified chronic kidney disease: Secondary | ICD-10-CM | POA: Diagnosis present

## 2024-08-20 DIAGNOSIS — Z91048 Other nonmedicinal substance allergy status: Secondary | ICD-10-CM

## 2024-08-20 DIAGNOSIS — Z90710 Acquired absence of both cervix and uterus: Secondary | ICD-10-CM

## 2024-08-20 DIAGNOSIS — I480 Paroxysmal atrial fibrillation: Secondary | ICD-10-CM | POA: Diagnosis present

## 2024-08-20 DIAGNOSIS — C649 Malignant neoplasm of unspecified kidney, except renal pelvis: Secondary | ICD-10-CM | POA: Diagnosis present

## 2024-08-20 DIAGNOSIS — R109 Unspecified abdominal pain: Secondary | ICD-10-CM

## 2024-08-20 DIAGNOSIS — Z87891 Personal history of nicotine dependence: Secondary | ICD-10-CM

## 2024-08-20 DIAGNOSIS — N1832 Chronic kidney disease, stage 3b: Secondary | ICD-10-CM | POA: Diagnosis present

## 2024-08-20 DIAGNOSIS — E1122 Type 2 diabetes mellitus with diabetic chronic kidney disease: Secondary | ICD-10-CM | POA: Diagnosis present

## 2024-08-20 DIAGNOSIS — Z860100 Personal history of colon polyps, unspecified: Secondary | ICD-10-CM

## 2024-08-20 DIAGNOSIS — Z8419 Family history of other disorders of kidney and ureter: Secondary | ICD-10-CM

## 2024-08-20 DIAGNOSIS — E871 Hypo-osmolality and hyponatremia: Secondary | ICD-10-CM | POA: Diagnosis present

## 2024-08-20 DIAGNOSIS — K9422 Gastrostomy infection: Secondary | ICD-10-CM

## 2024-08-20 DIAGNOSIS — Z7901 Long term (current) use of anticoagulants: Secondary | ICD-10-CM

## 2024-08-20 DIAGNOSIS — D630 Anemia in neoplastic disease: Secondary | ICD-10-CM | POA: Diagnosis present

## 2024-08-20 DIAGNOSIS — K9412 Enterostomy infection: Secondary | ICD-10-CM | POA: Diagnosis not present

## 2024-08-20 DIAGNOSIS — Z79899 Other long term (current) drug therapy: Secondary | ICD-10-CM

## 2024-08-20 DIAGNOSIS — Y833 Surgical operation with formation of external stoma as the cause of abnormal reaction of the patient, or of later complication, without mention of misadventure at the time of the procedure: Secondary | ICD-10-CM | POA: Diagnosis present

## 2024-08-20 DIAGNOSIS — I4892 Unspecified atrial flutter: Secondary | ICD-10-CM | POA: Diagnosis present

## 2024-08-20 DIAGNOSIS — R600 Localized edema: Secondary | ICD-10-CM | POA: Diagnosis present

## 2024-08-20 DIAGNOSIS — K9413 Enterostomy malfunction: Secondary | ICD-10-CM | POA: Diagnosis present

## 2024-08-20 DIAGNOSIS — R000 Tachycardia, unspecified: Secondary | ICD-10-CM | POA: Diagnosis present

## 2024-08-20 DIAGNOSIS — C250 Malignant neoplasm of head of pancreas: Secondary | ICD-10-CM | POA: Diagnosis present

## 2024-08-20 DIAGNOSIS — J45909 Unspecified asthma, uncomplicated: Secondary | ICD-10-CM | POA: Diagnosis present

## 2024-08-20 DIAGNOSIS — Z8249 Family history of ischemic heart disease and other diseases of the circulatory system: Secondary | ICD-10-CM

## 2024-08-20 DIAGNOSIS — L03311 Cellulitis of abdominal wall: Secondary | ICD-10-CM | POA: Diagnosis present

## 2024-08-20 DIAGNOSIS — E43 Unspecified severe protein-calorie malnutrition: Secondary | ICD-10-CM | POA: Diagnosis present

## 2024-08-20 DIAGNOSIS — E860 Dehydration: Secondary | ICD-10-CM | POA: Diagnosis present

## 2024-08-20 LAB — COMPREHENSIVE METABOLIC PANEL WITH GFR
ALT: 12 U/L (ref 0–44)
AST: 23 U/L (ref 15–41)
Albumin: 1.7 g/dL — ABNORMAL LOW (ref 3.5–5.0)
Alkaline Phosphatase: 120 U/L (ref 38–126)
Anion gap: 14 (ref 5–15)
BUN: 61 mg/dL — ABNORMAL HIGH (ref 8–23)
CO2: 24 mmol/L (ref 22–32)
Calcium: 8.8 mg/dL — ABNORMAL LOW (ref 8.9–10.3)
Chloride: 123 mmol/L — ABNORMAL HIGH (ref 98–111)
Creatinine, Ser: 2.1 mg/dL — ABNORMAL HIGH (ref 0.44–1.00)
GFR, Estimated: 25 mL/min — ABNORMAL LOW (ref >60)
Glucose, Bld: 108 mg/dL — ABNORMAL HIGH (ref 70–99)
Potassium: 3.7 mmol/L (ref 3.5–5.1)
Sodium: 161 mmol/L (ref 135–145)
Total Bilirubin: 0.8 mg/dL (ref 0.0–1.2)
Total Protein: 6.5 g/dL (ref 6.5–8.1)

## 2024-08-20 LAB — CBC WITH DIFFERENTIAL/PLATELET
Abs Immature Granulocytes: 0.43 K/uL — ABNORMAL HIGH (ref 0.00–0.07)
Basophils Absolute: 0 K/uL (ref 0.0–0.1)
Basophils Relative: 0 %
Eosinophils Absolute: 0.2 K/uL (ref 0.0–0.5)
Eosinophils Relative: 1 %
HCT: 26.7 % — ABNORMAL LOW (ref 36.0–46.0)
Hemoglobin: 7.9 g/dL — ABNORMAL LOW (ref 12.0–15.0)
Immature Granulocytes: 3 %
Lymphocytes Relative: 23 %
Lymphs Abs: 2.9 K/uL (ref 0.7–4.0)
MCH: 27.9 pg (ref 26.0–34.0)
MCHC: 29.6 g/dL — ABNORMAL LOW (ref 30.0–36.0)
MCV: 94.3 fL (ref 80.0–100.0)
Monocytes Absolute: 1.2 K/uL — ABNORMAL HIGH (ref 0.1–1.0)
Monocytes Relative: 9 %
Neutro Abs: 8.1 K/uL — ABNORMAL HIGH (ref 1.7–7.7)
Neutrophils Relative %: 64 %
Platelets: 337 K/uL (ref 150–400)
RBC: 2.83 MIL/uL — ABNORMAL LOW (ref 3.87–5.11)
RDW: 18.8 % — ABNORMAL HIGH (ref 11.5–15.5)
WBC: 12.8 K/uL — ABNORMAL HIGH (ref 4.0–10.5)
nRBC: 0.9 % — ABNORMAL HIGH (ref 0.0–0.2)

## 2024-08-20 LAB — TROPONIN I (HIGH SENSITIVITY)
Troponin I (High Sensitivity): 7 ng/L (ref <18)
Troponin I (High Sensitivity): 6 ng/L (ref <18)

## 2024-08-20 LAB — CBG MONITORING, ED: Glucose-Capillary: 100 mg/dL — ABNORMAL HIGH (ref 70–99)

## 2024-08-20 LAB — LIPASE, BLOOD: Lipase: 16 U/L (ref 11–51)

## 2024-08-20 MED ORDER — ACETAMINOPHEN 325 MG PO TABS: 650.0000 mg | ORAL_TABLET | Freq: Four times a day (QID) | ORAL | Status: DC | PRN

## 2024-08-20 MED ORDER — DEXTROSE-SODIUM CHLORIDE 5-0.45 % IV SOLN: INTRAVENOUS | Status: DC

## 2024-08-20 MED ORDER — MORPHINE SULFATE (PF) 2 MG/ML IV SOLN
2.0000 mg | INTRAVENOUS | Status: DC | PRN
  Administered 2024-08-23: 2 mg via INTRAVENOUS
  Filled 2024-08-20: qty 1

## 2024-08-20 MED ORDER — MORPHINE SULFATE (PF) 4 MG/ML IV SOLN
4.0000 mg | Freq: Once | INTRAVENOUS | Status: AC
  Administered 2024-08-20: 4 mg via INTRAVENOUS
  Filled 2024-08-20: qty 1

## 2024-08-20 MED ORDER — SODIUM CHLORIDE 0.9 % IV SOLN
1.0000 g | INTRAVENOUS | Status: DC
  Administered 2024-08-21 – 2024-08-23 (×4): 1 g via INTRAVENOUS
  Filled 2024-08-20 (×4): qty 10

## 2024-08-20 MED ORDER — VANCOMYCIN HCL 500 MG/100ML IV SOLN
500.0000 mg | Freq: Once | INTRAVENOUS | Status: AC
  Administered 2024-08-20: 500 mg via INTRAVENOUS
  Filled 2024-08-20: qty 100

## 2024-08-20 MED ORDER — VANCOMYCIN HCL 2000 MG/400ML IV SOLN
2000.0000 mg | Freq: Once | INTRAVENOUS | Status: AC
  Administered 2024-08-20: 2000 mg via INTRAVENOUS
  Filled 2024-08-20: qty 400

## 2024-08-20 MED ORDER — HYDRALAZINE HCL 20 MG/ML IJ SOLN: 5.0000 mg | INTRAMUSCULAR | Status: DC | PRN

## 2024-08-20 MED ORDER — LACTATED RINGERS IV BOLUS
500.0000 mL | Freq: Once | INTRAVENOUS | Status: AC
  Administered 2024-08-20: 500 mL via INTRAVENOUS

## 2024-08-20 MED ORDER — ONDANSETRON HCL 4 MG/2ML IJ SOLN: 4.0000 mg | Freq: Four times a day (QID) | INTRAMUSCULAR | Status: DC | PRN

## 2024-08-20 MED ORDER — INSULIN ASPART 100 UNIT/ML IJ SOLN
0.0000 [IU] | INTRAMUSCULAR | Status: DC
  Administered 2024-08-22 (×2): 5 [IU] via SUBCUTANEOUS
  Administered 2024-08-22 (×2): 3 [IU] via SUBCUTANEOUS
  Administered 2024-08-22: 2 [IU] via SUBCUTANEOUS
  Administered 2024-08-22: 5 [IU] via SUBCUTANEOUS
  Administered 2024-08-23 (×3): 2 [IU] via SUBCUTANEOUS
  Administered 2024-08-23 (×2): 3 [IU] via SUBCUTANEOUS
  Administered 2024-08-24 (×2): 2 [IU] via SUBCUTANEOUS
  Administered 2024-08-24: 5 [IU] via SUBCUTANEOUS
  Filled 2024-08-20 (×14): qty 1

## 2024-08-20 MED ORDER — VANCOMYCIN VARIABLE DOSE PER UNSTABLE RENAL FUNCTION (PHARMACIST DOSING)
Status: DC
  Filled 2024-08-20: qty 1

## 2024-08-20 MED ORDER — PIPERACILLIN-TAZOBACTAM 3.375 G IVPB 30 MIN
3.3750 g | Freq: Once | INTRAVENOUS | Status: AC
  Administered 2024-08-20: 3.375 g via INTRAVENOUS
  Filled 2024-08-20: qty 50

## 2024-08-20 MED ORDER — ACETAMINOPHEN 650 MG RE SUPP
650.0000 mg | Freq: Four times a day (QID) | RECTAL | Status: DC | PRN
Start: 2024-08-20 — End: 2024-08-24

## 2024-08-20 MED ORDER — ALBUTEROL SULFATE (2.5 MG/3ML) 0.083% IN NEBU: 2.5000 mg | INHALATION_SOLUTION | RESPIRATORY_TRACT | Status: DC | PRN

## 2024-08-20 MED ORDER — ONDANSETRON HCL 4 MG PO TABS: 4.0000 mg | ORAL_TABLET | Freq: Four times a day (QID) | ORAL | Status: DC | PRN

## 2024-08-20 NOTE — Assessment & Plan Note (Signed)
 Not acutely exacerbated Albuterol  as needed

## 2024-08-20 NOTE — Assessment & Plan Note (Addendum)
 BUN/creatinine 61/2.10 up from 33/1.65 a couple weeks Secondary to inadequate tube feeds with resulting dehydration IV hydration Monitor renal function and avoid nephrotoxins

## 2024-08-20 NOTE — H&P (Incomplete)
 History and Physical    Patient: Alyssa Leonard FMW:[id number] DOB: [birth year] DOA: 08/20/2024 DOS: the patient was seen and examined on 08/20/2024 PCP: [name] LABOR, MD  Patient coming from: Home  Chief Complaint:  Chief Complaint  Patient presents with  . Wound Infection    HPI: Alyssa Leonard is a 68 y.o. female with medical history hypertension, OSA, morbid obesity with history of Roux-en-Y, CKD stage IIIb renal cell carcinoma,  malignant neoplasm of the pancreas, paroxysmal A-fib on Eliquis ,  , hospitalized a month ago (07/13/2024 - 08/03/2024) with intractable vomiting and inability to tolerate p.o. due to large obstructing pancreatic mass s/p biliary stent 07/16/2024,s/p J-tube placement on 07/22/2024, being admitted with concerns for G-tube dysfunction with cellulitis around G-tube site, dehydration/AKI/hypernatremia.  Patient states she started having pain in the area of the J tube insertion about 3 days ago and then yesterday, the tube appeared to be blocked and started leaking around the insertion site. She has received no feeds in over 24 hours. She denies fever or chills.  She denies vomiting.   In the ED, afebrile tachycardic to 124, BP 128/63 saturating at 100% on room air Labs notable for WBC 12,000, hemoglobin 7.9 (8.4 couple weeks prior), CMP notable for sodium 161, BUN/creatinine 61/2.10 up from 33/1.65 a couple weeks prior LFTs improved from a couple weeks prior.  Lipase pending. EKG showing sinus tachycardia at 102 CT abdomen and pelvis showing grossly unchanged pancreatic head and uncinate process mass measuring up to 5.5 cm, unchanged right renal mass, common bile duct stent with pneumobilia unchanged.  Patient was treated with an LR bolus x 2, morphine  for pain, started on Zosyn and vancomycin   Admission requested.     Past Medical History:  Diagnosis Date  . Asthma   . Atrial flutter (HCC)   . COPD (chronic obstructive pulmonary disease) (HCC)   .  Diabetes mellitus without complication (HCC)   . GERD (gastroesophageal reflux disease)   . History of colon polyps   . Hypertension   . Meniere disease   . OSA (obstructive sleep apnea)   . Renal cell carcinoma (HCC)   . Sleep apnea    Went for sleep study test in January 2018 I haven't heard back from test   Past Surgical History:  Procedure Laterality Date  . ABDOMINAL HYSTERECTOMY    . BARIATRIC SURGERY    . CHOLECYSTECTOMY    . COLONOSCOPY WITH PROPOFOL  N/A 12/11/2016   Procedure: COLONOSCOPY WITH PROPOFOL ;  Surgeon: Lamar ONEIDA Holmes, MD;  Location: [facility] ENDOSCOPY;  Service: Endoscopy;  Laterality: N/A;  . COLONOSCOPY WITH PROPOFOL  N/A 11/27/2021   Procedure: COLONOSCOPY WITH PROPOFOL ;  Surgeon: Maryruth Ole ONEIDA, MD;  Location: [facility] ENDOSCOPY;  Service: Endoscopy;  Laterality: N/A;  . INSERTION, JEJUNOSTOMY TUBE, ROBOT-ASSISTED N/A 07/22/2024   Procedure: INSERTION, JEJUNOSTOMY TUBE, ROBOT-ASSISTED;  Surgeon: Jordis Laneta FALCON, MD;  Location: [facility] ORS;  Service: General;  Laterality: N/A;  . IR BILIARY STENT(S) EXIST ACCESS INC DILAT CATH EXCH/REMOVAL  07/16/2024  . IR CONVERT BILIARY DRAIN TO INT EXT BILIARY DRAIN  06/29/2024  . IR EXCHANGE BILIARY DRAIN  06/22/2024  . IR INT EXT BILIARY DRAIN WITH CHOLANGIOGRAM  06/09/2024  . IR RADIOLOGIST EVAL & MGMT  06/15/2024   Social History:  reports that she quit smoking about 30 years ago. Her smoking use included cigarettes. She has never used smokeless tobacco. She reports that she does not drink alcohol and does not use drugs.  Allergies  Allergen Reactions  .  Latex Other (See Comments), Dermatitis, Hives and Swelling    unknown  Other reaction(s): Other (See Comments)  unknown  Other reaction(s): Other (See Comments) unknown    unknown  . Aspirin Other (See Comments) and Nausea And Vomiting    Makes stomach burn  Other reaction(s): Other (See Comments)  Makes stomach burn  Makes stomach burn    Other reaction(s): Other  (See Comments) Makes stomach burn  . Plasticized Base [Plastibase]     Family History  Problem Relation Age of Onset  . Hypertension Mother   . Kidney disease Sister   . Kidney disease Brother   . Heart disease Brother   . Heart disease Brother   . Breast cancer Neg Hx     Prior to Admission medications   Medication Sig Start Date End Date Taking? Authorizing Provider  albuterol  (PROVENTIL ) (2.5 MG/3ML) 0.083% nebulizer solution Take 2.5 mg by nebulization every 4 (four) hours as needed for wheezing or shortness of breath. 04/05/24   [provider]  amiodarone  (PACERONE ) 200 MG tablet Take 1 tablet (200 mg total) by mouth daily. 04/20/24   Riddle, Suzann, NP  apixaban  (ELIQUIS ) 5 MG TABS tablet Take 1 tablet (5 mg total) by mouth 2 (two) times daily. Patient not taking: Reported on 07/13/2024 07/01/24   Patel, Sona, MD  diazepam  (VALIUM ) 2 MG tablet Take 1 tablet (2 mg total) by mouth 2 (two) times daily as needed for anxiety. Home med. 04/01/24   Awanda City, MD  dibucaine (NUPERCAINAL) 1 % OINT Place 1 Application rectally as needed for hemorrhoids.    [provider]  dorzolamide  (TRUSOPT ) 2 % ophthalmic solution Place 1 drop into both eyes 2 (two) times daily.    [provider]  famotidine  (PEPCID ) 20 MG tablet Place 1 tablet (20 mg total) into feeding tube 3 times/day as needed-between meals & bedtime for heartburn or indigestion. 08/03/24 09/02/24  Dezii, Alexandra, DO  metoprolol  tartrate (LOPRESSOR ) 25 MG tablet Place 1 tablet (25 mg total) into feeding tube 2 (two) times daily. 08/03/24 10/02/24  Dezii, Alexandra, DO  Nutritional Supplements (FEEDING SUPPLEMENT, OSMOLITE 1.5 CAL,) LIQD 1,350 mLs by Per J Tube route daily. 08/04/24   Dezii, Alexandra, DO  OLANZapine  (ZYPREXA ) 10 MG tablet Place 1 tablet (10 mg total) into feeding tube at bedtime. 08/03/24 09/02/24  Dezii, Alexandra, DO  sodium chloride  flush 0.9 % SOLN injection Flush biliary cathter with 5-10mL saline  daily to prevent clogging 06/11/24   Wouk, Devaughn Sayres, MD    Physical Exam: Vitals:   08/20/24 1437 08/20/24 1830 08/20/24 2007 08/20/24 2014  BP:  (!) 122/94 136/89 (!) 144/98  Pulse:    (!) 101  Resp:    13  Temp:    98.1 F (36.7 C)  TempSrc:      SpO2:    100%  Weight: 113.4 kg     Height: 5' (1.524 m)      Physical Exam Vitals and nursing note reviewed.  Constitutional:      General: She is not in acute distress.    Appearance: She is morbidly obese.  HENT:     Head: Normocephalic and atraumatic.  Cardiovascular:     Rate and Rhythm: Normal rate and regular rhythm.     Heart sounds: Normal heart sounds.  Pulmonary:     Effort: Pulmonary effort is normal.     Breath sounds: Normal breath sounds.  Abdominal:     Palpations: Abdomen is soft.  Tenderness: There is no abdominal tenderness.     Comments: See pics below  Neurological:     Mental Status: Mental status is at baseline.        Labs on Admission: I have personally reviewed following labs and imaging studies  CBC: Recent Labs  Lab 08/20/24 1438  WBC 12.8*  NEUTROABS 8.1*  HGB 7.9*  HCT 26.7*  MCV 94.3  PLT 337   Basic Metabolic Panel: Recent Labs  Lab 08/20/24 1438  NA 161*  K 3.7  CL 123*  CO2 24  GLUCOSE 108*  BUN 61*  CREATININE 2.10*  CALCIUM  8.8*   GFR: Estimated Creatinine Clearance: 29.4 mL/min (A) (by C-G formula based on SCr of 2.1 mg/dL (H)). Liver Function Tests: Recent Labs  Lab 08/20/24 1438  AST 23  ALT 12  ALKPHOS 120  BILITOT 0.8  PROT 6.5  ALBUMIN 1.7*   No results for input(s): LIPASE, AMYLASE in the last 168 hours. No results for input(s): AMMONIA in the last 168 hours. Coagulation Profile: No results for input(s): INR, PROTIME in the last 168 hours. Cardiac Enzymes: No results for input(s): CKTOTAL, CKMB, CKMBINDEX, TROPONINI in the last 168 hours. BNP (last 3 results) No results for input(s): PROBNP in the last 8760  hours. HbA1C: No results for input(s): HGBA1C in the last 72 hours. CBG: No results for input(s): GLUCAP in the last 168 hours. Lipid Profile: No results for input(s): CHOL, HDL, LDLCALC, TRIG, CHOLHDL, LDLDIRECT in the last 72 hours. Thyroid  Function Tests: No results for input(s): TSH, T4TOTAL, FREET4, T3FREE, THYROIDAB in the last 72 hours. Anemia Panel: No results for input(s): VITAMINB12, FOLATE, FERRITIN, TIBC, IRON, RETICCTPCT in the last 72 hours. Urine analysis:    Component Value Date/Time   COLORURINE AMBER (A) 06/30/2024 0522   APPEARANCEUR HAZY (A) 06/30/2024 0522   LABSPEC 1.016 06/30/2024 0522   PHURINE 5.0 06/30/2024 0522   GLUCOSEU NEGATIVE 06/30/2024 0522   HGBUR NEGATIVE 06/30/2024 0522   BILIRUBINUR NEGATIVE 06/30/2024 0522   KETONESUR 5 (A) 06/30/2024 0522   PROTEINUR NEGATIVE 06/30/2024 0522   NITRITE NEGATIVE 06/30/2024 0522   LEUKOCYTESUR NEGATIVE 06/30/2024 0522    Radiological Exams on Admission: CT ABDOMEN PELVIS WO CONTRAST Result Date: 08/20/2024 EXAM: CT ABDOMEN AND PELVIS WITHOUT CONTRAST 08/20/2024 08:02:07 PM TECHNIQUE: CT of the abdomen and pelvis was performed without the administration of intravenous contrast. Multiplanar reformatted images are provided for review. Automated exposure control, iterative reconstruction, and/or weight-based adjustment of the mA/kV was utilized to reduce the radiation dose to as low as reasonably achievable. COMPARISON: CT abdomen and pelvis dated 08/01/2024. CLINICAL HISTORY: LUQ pain around J tube site with purulent drainage. Patient has feeding tube on left side of abdomen. Reports it worked fine in the AM and then around lunch the tube became clogged. FINDINGS: LOWER CHEST: No acute abnormality. LIVER: The liver is unremarkable. GALLBLADDER AND BILE DUCTS: Gallbladder is surgically absent. Common bile duct stent is again seen with pneumobilia, unchanged. SPLEEN: No acute  abnormality. PANCREAS: Pancreatic head and uncinate process mass measures up to 5.5 cm and appears grossly unchanged given lack of contrast. There may be trace inflammatory stranding involving the body and tail of the pancreas. ADRENAL GLANDS: No acute abnormality. KIDNEYS, URETERS AND BLADDER: Heterogeneous right renal mass measures 10.3 x 11.4 cm and appears grossly unchanged. Mild right-sided hydronephrosis is also unchanged likely secondary to mass effect from renal mass. Additional small cysts identified in the right kidney and left kidney appear unchanged from  prior. No left-sided hydronephrosis. GI AND BOWEL: Her postsurgical changes in the stomach as seen on prior. Percutaneous jejunostomy tube in place, similar to prior. The appendix appears normal. PERITONEUM AND RETROPERITONEUM: No ascites. No free air. VASCULATURE: Aorta has atherosclerotic calcifications. LYMPH NODES: There are a few prominent central mesenteric lymph nodes similar to prior. REPRODUCTIVE ORGANS: Uterus is surgically absent. BONES AND SOFT TISSUES: No acute osseous abnormality. Left-sided body wall edema has mildly decreased. IMPRESSION: 1. Pancreatic head and uncinate process mass measuring up to 5.5 cm, grossly unchanged given lack of contrast, with possible trace inflammatory stranding in the body and tail of the pancreas. 2. Unchanged Heterogeneous right renal mass measuring 10.3 x 11.4 cm with mild right-sided hydronephrosis likely secondary to mass effect from the renal mass. 3. Common bile duct stent with pneumobilia, unchanged. 4. Left-sided body wall edema, mildly decreased. Electronically signed by: Greig Pique MD 08/20/2024 08:12 PM EDT RP Workstation: [id number]   Data Reviewed for HPI: Relevant notes from primary care and specialist visits, past discharge summaries as available in EHR, including Care Everywhere. Prior diagnostic testing as pertinent to current admission diagnoses Updated medications and problem lists  for reconciliation ED course, including vitals, labs, imaging, treatment and response to treatment Triage notes, nursing and pharmacy notes and ED provider's notes Notable results as noted above in HPI      Assessment and Plan: Cellulitis at gastrostomy tube site [facility]) Patient reports pain at tube insertion site Has mild leukocytosis Continue IV vancomycin  and Zosyn Skin barrier precautions  Gastrostomy tube dysfunction [facility]) Patient reports clogged leaking tube IR consulted for replacement Holding tube feeds Holding Eliquis  for procedure  Hypernatremia Sodium 161 Likely secondary to poor intake from malfunctioning G-tube IV hydration with D5 half NS Monitor sodium  Acute renal failure superimposed on stage 3b chronic kidney disease (HCC) BUN/creatinine 61/2.10 up from 33/1.65 a couple weeks Suspect secondary to inadequate tube feeds IV hydration Monitor renal function and avoid nephrotoxins  Carcinoma of head of pancreas (HCC) S/p metallic biliary stent on 07/16/2024 No acute issues, LFTs appears improved Patient is G-tube dependent due to obstructive mass  Paroxysmal atrial flutter (HCC) Holding Eliquis  due to plans for IR procedure/J-tube replacement in the a.m. Holding metoprolol  due to tube malfunction Will give IV metoprolol  as needed for rate control  Hypertension Hydralazine  IV as needed while off tube feeds  Renal cell carcinoma (HCC) Mass stable on CT No acute issues suspected  Anemia of chronic disease 7.9, down from 8.4 couple weeks prior Will continue to monitor  Asthma, chronic Not acutely exacerbated Albuterol  as needed  Diabetes mellitus without complication (HCC) Sliding scale insulin  coverage  Obesity, Class III, BMI 40-49.9 (morbid obesity) (HCC) History of gastric bypass surgery Complicating factor to overall prognosis and care    DVT prophylaxis: SCD  Consults: IR  Advance Care Planning:   Code Status: Prior   Family  Communication: none***  Disposition Plan: Back to previous home environment  Severity of Illness: The appropriate patient status for this patient is OBSERVATION. Observation status is judged to be reasonable and necessary in order to provide the required intensity of service to ensure the patient's safety. The patient's presenting symptoms, physical exam findings, and initial radiographic and laboratory data in the context of their medical condition is felt to place them at decreased risk for further clinical deterioration. Furthermore, it is anticipated that the patient will be medically stable for discharge from the hospital within 2 midnights of admission.   Author: Delayne  LULLA Solian, MD 08/20/2024 8:52 PM  For on call review [email or web address].

## 2024-08-20 NOTE — ED Triage Notes (Signed)
 Patient has feeding tube on left side of abdomen. Reports it worked fine in the AM and then around lunch the tube became clogged. Patient is now  having oozing around site and appears infected.  History kidney cancer. Reports no treatment at this time

## 2024-08-20 NOTE — H&P (Signed)
 History and Physical    Patient: Alyssa Leonard FMW:[id number] DOB: [birth year] DOA: 08/20/2024 DOS: the patient was seen and examined on 08/20/2024 PCP: [name] LABOR, MD  Patient coming from: Home  Chief Complaint:  Chief Complaint  Patient presents with   Wound Infection    HPI: Alyssa Leonard is a 68 y.o. female with medical history hypertension, OSA, morbid obesity with history of Roux-en-Y, CKD stage IIIb renal cell carcinoma,  malignant neoplasm of the pancreas, paroxysmal A-fib on Eliquis ,  , hospitalized a month ago (07/13/2024 - 08/03/2024) with intractable vomiting and inability to tolerate p.o. due to large obstructing pancreatic mass s/p biliary stent 07/16/2024,s/p J-tube placement on 07/22/2024, being admitted with concerns for G-tube dysfunction with cellulitis around G-tube site, dehydration/AKI/hypernatremia.  Patient states she started having pain in the area of the J tube insertion about 3 days ago and then yesterday, the tube appeared to be blocked and started leaking around the insertion site. She has received no feeds in over 24 hours. She denies fever or chills.  She denies vomiting.   In the ED, afebrile tachycardic to 124, BP 128/63 saturating at 100% on room air Labs notable for WBC 12,000, hemoglobin 7.9 (8.4 couple weeks prior), CMP notable for sodium 161, BUN/creatinine 61/2.10 up from 33/1.65 a couple weeks prior LFTs improved from a couple weeks prior.  Lipase pending. EKG showing sinus tachycardia at 102 CT abdomen and pelvis showing grossly unchanged pancreatic head and uncinate process mass measuring up to 5.5 cm, unchanged right renal mass, common bile duct stent with pneumobilia unchanged.  Patient was treated with an LR bolus x 2, morphine  for pain, started on Zosyn and vancomycin   Admission requested.     Past Medical History:  Diagnosis Date   Asthma    Atrial flutter (HCC)    COPD (chronic obstructive pulmonary disease) (HCC)    Diabetes  mellitus without complication (HCC)    GERD (gastroesophageal reflux disease)    History of colon polyps    Hypertension    Meniere disease    OSA (obstructive sleep apnea)    Renal cell carcinoma (HCC)    Sleep apnea    Went for sleep study test in January 2018 I haven't heard back from test   Past Surgical History:  Procedure Laterality Date   ABDOMINAL HYSTERECTOMY     BARIATRIC SURGERY     CHOLECYSTECTOMY     COLONOSCOPY WITH PROPOFOL  N/A 12/11/2016   Procedure: COLONOSCOPY WITH PROPOFOL ;  Surgeon: Lamar ONEIDA Holmes, MD;  Location: [facility] ENDOSCOPY;  Service: Endoscopy;  Laterality: N/A;   COLONOSCOPY WITH PROPOFOL  N/A 11/27/2021   Procedure: COLONOSCOPY WITH PROPOFOL ;  Surgeon: Maryruth Ole ONEIDA, MD;  Location: [facility] ENDOSCOPY;  Service: Endoscopy;  Laterality: N/A;   INSERTION, JEJUNOSTOMY TUBE, ROBOT-ASSISTED N/A 07/22/2024   Procedure: INSERTION, JEJUNOSTOMY TUBE, ROBOT-ASSISTED;  Surgeon: Jordis Laneta FALCON, MD;  Location: [facility] ORS;  Service: General;  Laterality: N/A;   IR BILIARY STENT(S) EXIST ACCESS INC DILAT CATH EXCH/REMOVAL  07/16/2024   IR CONVERT BILIARY DRAIN TO INT EXT BILIARY DRAIN  06/29/2024   IR EXCHANGE BILIARY DRAIN  06/22/2024   IR INT EXT BILIARY DRAIN WITH CHOLANGIOGRAM  06/09/2024   IR RADIOLOGIST EVAL & MGMT  06/15/2024   Social History:  reports that she quit smoking about 30 years ago. Her smoking use included cigarettes. She has never used smokeless tobacco. She reports that she does not drink alcohol and does not use drugs.  Allergies  Allergen Reactions  Latex Other (See Comments), Dermatitis, Hives and Swelling    unknown  Other reaction(s): Other (See Comments)  unknown  Other reaction(s): Other (See Comments) unknown    unknown   Aspirin Other (See Comments) and Nausea And Vomiting    Makes stomach burn  Other reaction(s): Other (See Comments)  Makes stomach burn  Makes stomach burn    Other reaction(s): Other (See Comments) Makes stomach  burn   Plasticized Base [Plastibase]     Family History  Problem Relation Age of Onset   Hypertension Mother    Kidney disease Sister    Kidney disease Brother    Heart disease Brother    Heart disease Brother    Breast cancer Neg Hx     Prior to Admission medications   Medication Sig Start Date End Date Taking? Authorizing Provider  albuterol  (PROVENTIL ) (2.5 MG/3ML) 0.083% nebulizer solution Take 2.5 mg by nebulization every 4 (four) hours as needed for wheezing or shortness of breath. 04/05/24   [provider]  amiodarone  (PACERONE ) 200 MG tablet Take 1 tablet (200 mg total) by mouth daily. 04/20/24   Riddle, Suzann, NP  apixaban  (ELIQUIS ) 5 MG TABS tablet Take 1 tablet (5 mg total) by mouth 2 (two) times daily. Patient not taking: Reported on 07/13/2024 07/01/24   Patel, Sona, MD  diazepam  (VALIUM ) 2 MG tablet Take 1 tablet (2 mg total) by mouth 2 (two) times daily as needed for anxiety. Home med. 04/01/24   Awanda City, MD  dibucaine (NUPERCAINAL) 1 % OINT Place 1 Application rectally as needed for hemorrhoids.    [provider]  dorzolamide  (TRUSOPT ) 2 % ophthalmic solution Place 1 drop into both eyes 2 (two) times daily.    [provider]  famotidine  (PEPCID ) 20 MG tablet Place 1 tablet (20 mg total) into feeding tube 3 times/day as needed-between meals & bedtime for heartburn or indigestion. 08/03/24 09/02/24  Dezii, Alexandra, DO  metoprolol  tartrate (LOPRESSOR ) 25 MG tablet Place 1 tablet (25 mg total) into feeding tube 2 (two) times daily. 08/03/24 10/02/24  Dezii, Alexandra, DO  Nutritional Supplements (FEEDING SUPPLEMENT, OSMOLITE 1.5 CAL,) LIQD 1,350 mLs by Per J Tube route daily. 08/04/24   Dezii, Alexandra, DO  OLANZapine  (ZYPREXA ) 10 MG tablet Place 1 tablet (10 mg total) into feeding tube at bedtime. 08/03/24 09/02/24  Dezii, Alexandra, DO  sodium chloride  flush 0.9 % SOLN injection Flush biliary cathter with 5-10mL saline daily to prevent clogging 06/11/24    Wouk, Devaughn Sayres, MD    Physical Exam: Vitals:   08/20/24 1437 08/20/24 1830 08/20/24 2007 08/20/24 2014  BP:  (!) 122/94 136/89 (!) 144/98  Pulse:    (!) 101  Resp:    13  Temp:    98.1 F (36.7 C)  TempSrc:      SpO2:    100%  Weight: 113.4 kg     Height: 5' (1.524 m)      Physical Exam Vitals and nursing note reviewed.  Constitutional:      General: She is not in acute distress.    Appearance: She is morbidly obese.  HENT:     Head: Normocephalic and atraumatic.  Cardiovascular:     Rate and Rhythm: Normal rate and regular rhythm.     Heart sounds: Normal heart sounds.  Pulmonary:     Effort: Pulmonary effort is normal.     Breath sounds: Normal breath sounds.  Abdominal:     Palpations: Abdomen is soft.  Tenderness: There is no abdominal tenderness.     Comments: See pics below  Neurological:     Mental Status: Mental status is at baseline.        Labs on Admission: I have personally reviewed following labs and imaging studies  CBC: Recent Labs  Lab 08/20/24 1438  WBC 12.8*  NEUTROABS 8.1*  HGB 7.9*  HCT 26.7*  MCV 94.3  PLT 337   Basic Metabolic Panel: Recent Labs  Lab 08/20/24 1438  NA 161*  K 3.7  CL 123*  CO2 24  GLUCOSE 108*  BUN 61*  CREATININE 2.10*  CALCIUM  8.8*   GFR: Estimated Creatinine Clearance: 29.4 mL/min (A) (by C-G formula based on SCr of 2.1 mg/dL (H)). Liver Function Tests: Recent Labs  Lab 08/20/24 1438  AST 23  ALT 12  ALKPHOS 120  BILITOT 0.8  PROT 6.5  ALBUMIN 1.7*   No results for input(s): LIPASE, AMYLASE in the last 168 hours. No results for input(s): AMMONIA in the last 168 hours. Coagulation Profile: No results for input(s): INR, PROTIME in the last 168 hours. Cardiac Enzymes: No results for input(s): CKTOTAL, CKMB, CKMBINDEX, TROPONINI in the last 168 hours. BNP (last 3 results) No results for input(s): PROBNP in the last 8760 hours. HbA1C: No results for input(s):  HGBA1C in the last 72 hours. CBG: No results for input(s): GLUCAP in the last 168 hours. Lipid Profile: No results for input(s): CHOL, HDL, LDLCALC, TRIG, CHOLHDL, LDLDIRECT in the last 72 hours. Thyroid  Function Tests: No results for input(s): TSH, T4TOTAL, FREET4, T3FREE, THYROIDAB in the last 72 hours. Anemia Panel: No results for input(s): VITAMINB12, FOLATE, FERRITIN, TIBC, IRON, RETICCTPCT in the last 72 hours. Urine analysis:    Component Value Date/Time   COLORURINE AMBER (A) 06/30/2024 0522   APPEARANCEUR HAZY (A) 06/30/2024 0522   LABSPEC 1.016 06/30/2024 0522   PHURINE 5.0 06/30/2024 0522   GLUCOSEU NEGATIVE 06/30/2024 0522   HGBUR NEGATIVE 06/30/2024 0522   BILIRUBINUR NEGATIVE 06/30/2024 0522   KETONESUR 5 (A) 06/30/2024 0522   PROTEINUR NEGATIVE 06/30/2024 0522   NITRITE NEGATIVE 06/30/2024 0522   LEUKOCYTESUR NEGATIVE 06/30/2024 0522    Radiological Exams on Admission: CT ABDOMEN PELVIS WO CONTRAST Result Date: 08/20/2024 EXAM: CT ABDOMEN AND PELVIS WITHOUT CONTRAST 08/20/2024 08:02:07 PM TECHNIQUE: CT of the abdomen and pelvis was performed without the administration of intravenous contrast. Multiplanar reformatted images are provided for review. Automated exposure control, iterative reconstruction, and/or weight-based adjustment of the mA/kV was utilized to reduce the radiation dose to as low as reasonably achievable. COMPARISON: CT abdomen and pelvis dated 08/01/2024. CLINICAL HISTORY: LUQ pain around J tube site with purulent drainage. Patient has feeding tube on left side of abdomen. Reports it worked fine in the AM and then around lunch the tube became clogged. FINDINGS: LOWER CHEST: No acute abnormality. LIVER: The liver is unremarkable. GALLBLADDER AND BILE DUCTS: Gallbladder is surgically absent. Common bile duct stent is again seen with pneumobilia, unchanged. SPLEEN: No acute abnormality. PANCREAS: Pancreatic head and  uncinate process mass measures up to 5.5 cm and appears grossly unchanged given lack of contrast. There may be trace inflammatory stranding involving the body and tail of the pancreas. ADRENAL GLANDS: No acute abnormality. KIDNEYS, URETERS AND BLADDER: Heterogeneous right renal mass measures 10.3 x 11.4 cm and appears grossly unchanged. Mild right-sided hydronephrosis is also unchanged likely secondary to mass effect from renal mass. Additional small cysts identified in the right kidney and left kidney appear unchanged from  prior. No left-sided hydronephrosis. GI AND BOWEL: Her postsurgical changes in the stomach as seen on prior. Percutaneous jejunostomy tube in place, similar to prior. The appendix appears normal. PERITONEUM AND RETROPERITONEUM: No ascites. No free air. VASCULATURE: Aorta has atherosclerotic calcifications. LYMPH NODES: There are a few prominent central mesenteric lymph nodes similar to prior. REPRODUCTIVE ORGANS: Uterus is surgically absent. BONES AND SOFT TISSUES: No acute osseous abnormality. Left-sided body wall edema has mildly decreased. IMPRESSION: 1. Pancreatic head and uncinate process mass measuring up to 5.5 cm, grossly unchanged given lack of contrast, with possible trace inflammatory stranding in the body and tail of the pancreas. 2. Unchanged Heterogeneous right renal mass measuring 10.3 x 11.4 cm with mild right-sided hydronephrosis likely secondary to mass effect from the renal mass. 3. Common bile duct stent with pneumobilia, unchanged. 4. Left-sided body wall edema, mildly decreased. Electronically signed by: Greig Pique MD 08/20/2024 08:12 PM EDT RP Workstation: [id number]   Data Reviewed for HPI: Relevant notes from primary care and specialist visits, past discharge summaries as available in EHR, including Care Everywhere. Prior diagnostic testing as pertinent to current admission diagnoses Updated medications and problem lists for reconciliation ED course, including  vitals, labs, imaging, treatment and response to treatment Triage notes, nursing and pharmacy notes and ED provider's notes Notable results as noted above in HPI      Assessment and Plan: Cellulitis at gastrostomy tube site [facility]) Patient reports pain at tube insertion site Has mild leukocytosis Continue IV vancomycin  and Zosyn Skin barrier precautions  Gastrostomy tube dysfunction [facility]) Patient reports clogged leaking tube IR consulted for replacement Holding tube feeds Holding Eliquis  for procedure  Hypernatremia Sodium 161 Likely secondary to poor intake from malfunctioning G-tube IV hydration with D5 half NS Monitor sodium  Acute renal failure superimposed on stage 3b chronic kidney disease (HCC) BUN/creatinine 61/2.10 up from 33/1.65 a couple weeks Secondary to inadequate tube feeds with resulting dehydration IV hydration Monitor renal function and avoid nephrotoxins  Carcinoma of head of pancreas (HCC) S/p metallic biliary stent on 07/16/2024 No acute issues, LFTs appears improved Patient is G-tube dependent due to obstructive mass  Paroxysmal atrial flutter (HCC) Holding Eliquis  due to plans for IR procedure/J-tube replacement in the a.m. Holding metoprolol  due to tube malfunction Will give IV metoprolol  as needed for rate control  Hypertension Hydralazine  IV as needed while off tube feeds  Renal cell carcinoma (HCC) Mass stable on CT No acute issues suspected  Anemia of chronic disease 7.9, down from 8.4 couple weeks prior Will continue to monitor  Asthma, chronic Not acutely exacerbated Albuterol  as needed  Diabetes mellitus without complication (HCC) Sliding scale insulin  coverage  Obesity, Class III, BMI 40-49.9 (morbid obesity) (HCC) History of gastric bypass surgery Complicating factor to overall prognosis and care    DVT prophylaxis: SCD  Consults: IR  Advance Care Planning:   Code Status: Prior   Family Communication:  none  Disposition Plan: Back to previous home environment  Severity of Illness: The appropriate patient status for this patient is OBSERVATION. Observation status is judged to be reasonable and necessary in order to provide the required intensity of service to ensure the patient's safety. The patient's presenting symptoms, physical exam findings, and initial radiographic and laboratory data in the context of their medical condition is felt to place them at decreased risk for further clinical deterioration. Furthermore, it is anticipated that the patient will be medically stable for discharge from the hospital within 2 midnights of admission.  Author: Delayne LULLA Solian, MD 08/20/2024 8:52 PM  For on call review [email or web address].

## 2024-08-20 NOTE — ED Provider Notes (Signed)
 Alyssa Leonard Provider Note    Event Date/Time   First MD Initiated Contact with Patient 08/20/24 1824     (approximate)   History   Wound Infection   HPI  Alyssa Leonard is a 68 y.o. 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  States that it was working fine in the morning, around lunchtime the tube became clogged.  She has been having pain as well as purulent drainage around the G-tube site for the past week.  No fever, she does feel nauseous, no vomiting.  Had a little bit of diarrhea earlier today but none now.  No chest pain or shortness of breath, no urinary symptoms.  States that she exclusively uses tube feeds now for food, is able to drink some water .  On independent review, she was admitted in early October for abdominal fullness and poor oral intake.  Had a J-tube placed on 25 September and is using it now for nocturnal feeds at home.     Physical Exam   Triage Vital Signs: ED Triage Vitals  Encounter Vitals Group     BP 08/20/24 1426 128/63     Girls Systolic BP Percentile --      Girls Diastolic BP Percentile --      Boys Systolic BP Percentile --      Boys Diastolic BP Percentile --      Pulse Rate 08/20/24 1426 (!) 124     Resp 08/20/24 1426 16     Temp 08/20/24 1426 97.6 F (36.4 C)     Temp Source 08/20/24 1426 Oral     SpO2 08/20/24 1426 100 %     Weight 08/20/24 1437 250 lb (113.4 kg)     Height 08/20/24 1437 5' (1.524 m)     Head Circumference --      Peak Flow --      Pain Score 08/20/24 1437 7     Pain Loc --      Pain Education --      Exclude from Growth Chart --     Most recent vital signs: Vitals:   08/20/24 2007 08/20/24 2014  BP: 136/89 (!) 144/98  Pulse:  (!) 101  Resp:  13  Temp:  98.1 F (36.7 C)  SpO2:  100%      General: Awake, no distress.  CV:  Good peripheral perfusion.  Resp:  Normal effort.  Abd:  No distention.  Soft, tender to the left upper quadrant and around the J-tube site, there is no overlying erythema but she does have purulent drainage. Other:  Bilateral lower extremity edema noted, mildly dry mucous membranes.   ED Results / Procedures / Treatments   Labs (all labs ordered are listed, but only abnormal results are displayed) Labs Reviewed  CBC WITH DIFFERENTIAL/PLATELET - Abnormal; Notable for the following components:      Result Value   WBC 12.8 (*)    RBC 2.83 (*)    Hemoglobin 7.9 (*)    HCT 26.7 (*)    MCHC 29.6 (*)    RDW 18.8 (*)    nRBC 0.9 (*)    Neutro Abs 8.1 (*)    Monocytes Absolute 1.2 (*)    Abs Immature Granulocytes 0.43 (*)    All other components within normal limits  COMPREHENSIVE METABOLIC PANEL WITH  GFR - Abnormal; Notable for the following components:   Sodium 161 (*)    Chloride 123 (*)    Glucose, Bld 108 (*)    BUN 61 (*)    Creatinine, Ser 2.10 (*)    Calcium  8.8 (*)    Albumin 1.7 (*)    GFR, Estimated 25 (*)    All other components within normal limits  LIPASE, BLOOD  URINALYSIS, W/ REFLEX TO CULTURE (INFECTION SUSPECTED)  TROPONIN I (HIGH SENSITIVITY)     EKG  EKG shows, sinus tachycardia, rate 102, normal QS, normal QTc, no obvious ischemic ST elevation, T wave flattening in aVF, T wave inversion in 3, T wave inversions new, to prior   RADIOLOGY On my independent interpretation, CT without obvious abscess at the G-tube insertion site   PROCEDURES:  Critical Care performed: No  Procedures   MEDICATIONS ORDERED IN ED: Medications  vancomycin  (VANCOREADY) IVPB 2000 mg/400 mL (2,000 mg Intravenous New Bag/Given 08/20/24 2051)  morphine  (PF) 4 MG/ML injection 4 mg (4 mg Intravenous Given 08/20/24 2013)  lactated ringers  bolus 500 mL (0 mLs Intravenous Stopped 08/20/24 2051)  piperacillin-tazobactam (ZOSYN) IVPB  3.375 g (0 g Intravenous Stopped 08/20/24 2038)     IMPRESSION / MDM / ASSESSMENT AND PLAN / ED COURSE  I reviewed the triage vital signs and the nursing notes.                              Differential diagnosis includes, but is not limited to, did consider dehydration, electrolyte derangements, atypical ACS, J-tube site infection, cellulitis, abscess.  Also considered colitis, diverticulitis.  Basic labs were obtained out of triage, will add an EKG as well as troponin and UA.  Will add a CT abdomen pelvis.  She was noted to be hypernatremic, suspect this could be due to mild dehydration given that she has not had much to eat or drink today since her G-tube is clogged.  Will give her a small fluid bolus.  Will also start her on IV antibiotics for J-tube site infection.  Patient's presentation is most consistent with acute presentation with potential threat to life or bodily function.  Independent interpretation of labs and imaging below.  Given the infection around J-tube, hyponatremia, she will need to be admitted for further management.  Consult to hospitalist will admit the patient.  She is admitted.  The patient is on the cardiac monitor to evaluate for evidence of arrhythmia and/or significant heart rate changes.   Clinical Course as of 08/20/24 2055  Fri Aug 20, 2024  2019 CT ABDOMEN PELVIS WO CONTRAST IMPRESSION: 1. Pancreatic head and uncinate process mass measuring up to 5.5 cm, grossly unchanged given lack of contrast, with possible trace inflammatory stranding in the body and tail of the pancreas. 2. Unchanged Heterogeneous right renal mass measuring 10.3 x 11.4 cm with mild right-sided hydronephrosis likely secondary to mass effect from the renal mass. 3. Common bile duct stent with pneumobilia, unchanged. 4. Left-sided body wall edema, mildly decreased.   [TT]    Clinical Course User Index [TT] Waymond, Lorelle Cummins, MD     FINAL CLINICAL IMPRESSION(S) / ED DIAGNOSES    Final diagnoses:  Wound infection  Hypernatremia  Malfunction of jejunostomy tube (HCC)  Abdominal pain, unspecified abdominal location     Rx / DC Orders   ED Discharge Orders     None        Note:  This  document was prepared using Conservation officer, historic buildings and may include unintentional dictation errors.    Waymond Lorelle Cummins, MD 08/20/24 [phone]

## 2024-08-20 NOTE — Assessment & Plan Note (Signed)
 7.9, down from 8.4 couple weeks prior Will continue to monitor

## 2024-08-20 NOTE — Assessment & Plan Note (Signed)
 Sliding scale insulin  coverage

## 2024-08-20 NOTE — Assessment & Plan Note (Signed)
 History of gastric bypass surgery Complicating factor to overall prognosis and care

## 2024-08-20 NOTE — Assessment & Plan Note (Signed)
 Holding Eliquis  due to plans for IR procedure/J-tube replacement in the a.m. Holding metoprolol  due to tube malfunction Will give IV metoprolol  as needed for rate control

## 2024-08-20 NOTE — Assessment & Plan Note (Signed)
 Hydralazine  IV as needed while off tube feeds

## 2024-08-20 NOTE — Hospital Course (Signed)
 SABRA

## 2024-08-20 NOTE — Consult Note (Signed)
 Pharmacy Antibiotic Note  Alyssa Leonard is a 68 y.o. female admitted on 08/20/2024 with cellulitis at G tube site.  Pharmacy has been consulted for vancomycin  dosing.  AKI on CKD, Scr 2.1 (1.65 08/03/24)  Plan: Give total of vancomycin  2500 mg IV x 1, then use vancomycin  variable dosing until AKI resolved Vancomycin  levels at steady state or as clinically indicated Ceftriaxone  1 gram IV every 24 hours per provider Follow renal function and cultures for adjustments  Height: 5' (152.4 cm) Weight: 113.4 kg (250 lb) IBW/kg (Calculated) : 45.5  Temp (24hrs), Avg:97.9 F (36.6 C), Min:97.6 F (36.4 C), Max:98.1 F (36.7 C)  Recent Labs  Lab 08/20/24 1438  WBC 12.8*  CREATININE 2.10*    Estimated Creatinine Clearance: 29.4 mL/min (A) (by C-G formula based on SCr of 2.1 mg/dL (H)).    Allergies  Allergen Reactions   Latex Other (See Comments), Dermatitis, Hives and Swelling    unknown  Other reaction(s): Other (See Comments)  unknown  Other reaction(s): Other (See Comments) unknown    unknown   Aspirin Other (See Comments) and Nausea And Vomiting    Makes stomach burn  Other reaction(s): Other (See Comments)  Makes stomach burn  Makes stomach burn    Other reaction(s): Other (See Comments) Makes stomach burn   Plasticized Base [Plastibase]     Antimicrobials this admission: Ceftriaxone  10/25 >>  vancomycin  10/24 >>    Microbiology results: N/A  Thank you for allowing pharmacy to be a part of this patient's care.  Alyssa Leonard, PharmD 08/20/2024 9:15 PM

## 2024-08-20 NOTE — Assessment & Plan Note (Signed)
 Mass stable on CT No acute issues suspected

## 2024-08-20 NOTE — Assessment & Plan Note (Signed)
 Sodium 161 Likely secondary to poor intake from malfunctioning G-tube IV hydration with D5 half NS Monitor sodium

## 2024-08-20 NOTE — Consult Note (Signed)
 ED Pharmacy Antibiotic Sign Off An antibiotic consult was received from an ED provider for vancomycin  and Zosyn per pharmacy dosing for wound infection. A chart review was completed to assess appropriateness.   The following one time order(s) were placed:  Zosyn 3.375 g IV x 1 Vancomycin  2000 mg IV x 1  Further antibiotic and/or antibiotic pharmacy consults should be ordered by the admitting provider if indicated.   Thank you for allowing pharmacy to be a part of this patient's care.   Kayla JULIANNA Blew, [facility]  Clinical Pharmacist 08/20/24 7:51 PM

## 2024-08-20 NOTE — Assessment & Plan Note (Signed)
 Patient reports pain at tube insertion site Has mild leukocytosis Continue IV vancomycin  and Zosyn Skin barrier precautions

## 2024-08-20 NOTE — Assessment & Plan Note (Signed)
 S/p metallic biliary stent on 07/16/2024 No acute issues, LFTs appears improved Patient is G-tube dependent due to obstructive mass

## 2024-08-20 NOTE — Assessment & Plan Note (Signed)
 Patient reports clogged leaking tube IR consulted for replacement Holding tube feeds Holding Eliquis  for procedure

## 2024-08-21 DIAGNOSIS — K9413 Enterostomy malfunction: Secondary | ICD-10-CM | POA: Diagnosis not present

## 2024-08-21 DIAGNOSIS — E870 Hyperosmolality and hypernatremia: Secondary | ICD-10-CM

## 2024-08-21 LAB — BASIC METABOLIC PANEL WITH GFR
Anion gap: 15 (ref 5–15)
BUN: 56 mg/dL — ABNORMAL HIGH (ref 8–23)
CO2: 21 mmol/L — ABNORMAL LOW (ref 22–32)
Calcium: 8.1 mg/dL — ABNORMAL LOW (ref 8.9–10.3)
Chloride: 121 mmol/L — ABNORMAL HIGH (ref 98–111)
Creatinine, Ser: 2.06 mg/dL — ABNORMAL HIGH (ref 0.44–1.00)
GFR, Estimated: 26 mL/min — ABNORMAL LOW (ref >60)
Glucose, Bld: 187 mg/dL — ABNORMAL HIGH (ref 70–99)
Potassium: 3.4 mmol/L — ABNORMAL LOW (ref 3.5–5.1)
Sodium: 157 mmol/L — ABNORMAL HIGH (ref 135–145)

## 2024-08-21 LAB — CBG MONITORING, ED
Glucose-Capillary: 82 mg/dL (ref 70–99)
Glucose-Capillary: 83 mg/dL (ref 70–99)
Glucose-Capillary: 109 mg/dL — ABNORMAL HIGH (ref 70–99)
Glucose-Capillary: 115 mg/dL — ABNORMAL HIGH (ref 70–99)

## 2024-08-21 LAB — URINALYSIS, W/ REFLEX TO CULTURE (INFECTION SUSPECTED)
Bilirubin Urine: NEGATIVE
Glucose, UA: NEGATIVE mg/dL
Hgb urine dipstick: NEGATIVE
Ketones, ur: NEGATIVE mg/dL
Leukocytes,Ua: NEGATIVE
Nitrite: NEGATIVE
Protein, ur: NEGATIVE mg/dL
Specific Gravity, Urine: 1.013 (ref 1.005–1.030)
pH: 5 (ref 5.0–8.0)

## 2024-08-21 LAB — CBC
HCT: 20.5 % — ABNORMAL LOW (ref 36.0–46.0)
Hemoglobin: 6 g/dL — ABNORMAL LOW (ref 12.0–15.0)
MCH: 28.2 pg (ref 26.0–34.0)
MCHC: 29.3 g/dL — ABNORMAL LOW (ref 30.0–36.0)
MCV: 96.2 fL (ref 80.0–100.0)
Platelets: 232 K/uL (ref 150–400)
RBC: 2.13 MIL/uL — ABNORMAL LOW (ref 3.87–5.11)
RDW: 19.2 % — ABNORMAL HIGH (ref 11.5–15.5)
WBC: 11.3 K/uL — ABNORMAL HIGH (ref 4.0–10.5)
nRBC: 0.5 % — ABNORMAL HIGH (ref 0.0–0.2)

## 2024-08-21 LAB — TYPE AND SCREEN

## 2024-08-21 LAB — GLUCOSE, CAPILLARY
Glucose-Capillary: 86 mg/dL (ref 70–99)
Glucose-Capillary: 112 mg/dL — ABNORMAL HIGH (ref 70–99)

## 2024-08-21 LAB — PHOSPHORUS: Phosphorus: 4.8 mg/dL — ABNORMAL HIGH (ref 2.5–4.6)

## 2024-08-21 LAB — PREPARE RBC (CROSSMATCH)

## 2024-08-21 LAB — MAGNESIUM: Magnesium: 2.4 mg/dL (ref 1.7–2.4)

## 2024-08-21 MED ORDER — PROSOURCE TF20 ENFIT COMPATIBL EN LIQD
60.0000 mL | Freq: Every day | ENTERAL | Status: DC
  Administered 2024-08-21 – 2024-08-24 (×4): 60 mL
  Filled 2024-08-21: qty 60

## 2024-08-21 MED ORDER — SODIUM CHLORIDE 0.9% FLUSH: 10.0000 mL | INTRAVENOUS | Status: DC | PRN

## 2024-08-21 MED ORDER — ADULT MULTIVITAMIN W/MINERALS CH
1.0000 | ORAL_TABLET | Freq: Every day | ORAL | Status: DC
  Administered 2024-08-21 – 2024-08-24 (×4): 1
  Filled 2024-08-21 (×4): qty 1

## 2024-08-21 MED ORDER — SODIUM CHLORIDE 0.9% IV SOLUTION
Freq: Once | INTRAVENOUS | Status: AC
Start: 2024-08-21 — End: 2024-08-21
  Filled 2024-08-21: qty 250

## 2024-08-21 MED ORDER — VITAMIN A 3 MG (10000 UNIT) PO CAPS
10000.0000 [IU] | ORAL_CAPSULE | Freq: Every day | ORAL | Status: DC
  Filled 2024-08-21 (×2): qty 1

## 2024-08-21 MED ORDER — FREE WATER
120.0000 mL | Status: DC
  Administered 2024-08-21 – 2024-08-24 (×18): 120 mL

## 2024-08-21 MED ORDER — COPPER 2 MG PO TABS
2.0000 mg | Freq: Every day | Status: DC
  Administered 2024-08-21 – 2024-08-24 (×4): 2 mg
  Filled 2024-08-21 (×4): qty 1

## 2024-08-21 MED ORDER — DEXTROSE 5 % IV SOLN
INTRAVENOUS | Status: AC
Start: 2024-08-21 — End: 2024-08-22

## 2024-08-21 MED ORDER — VITAL HP 1.0 CAL PO LIQD: 1000.0000 mL | ORAL | Status: DC

## 2024-08-21 MED ORDER — SODIUM BICARBONATE 650 MG PO TABS: 650.0000 mg | ORAL_TABLET | Freq: Once | ORAL | Status: DC

## 2024-08-21 MED ORDER — PANCRELIPASE (LIP-PROT-AMYL) 10440-39150 UNITS PO TABS
20880.0000 [IU] | ORAL_TABLET | Freq: Once | ORAL | Status: DC
  Filled 2024-08-21: qty 2

## 2024-08-21 MED ORDER — FOLIC ACID 1 MG PO TABS
1.0000 mg | ORAL_TABLET | Freq: Every day | ORAL | Status: DC
  Administered 2024-08-21 – 2024-08-24 (×4): 1 mg
  Filled 2024-08-21 (×4): qty 1

## 2024-08-21 MED ORDER — OSMOLITE 1.5 CAL PO LIQD
1350.0000 mL | ORAL | Status: DC
  Administered 2024-08-21: 1350 mL

## 2024-08-21 NOTE — ED Notes (Signed)
 Cleaned pt's feeding tube site and wrapped it with a new dressing

## 2024-08-21 NOTE — Consult Note (Signed)
 Date of Consultation:  08/21/2024  Requesting Physician:  Drue Potter, MD  Reason for Consultation:  Malfunctioning J tube  History of Present Illness: Alyssa Leonard is a 68 y.o. female admitted yesterday due to possible infection around J-tube site and malfunction of the tube.  The patient reports that she had been using the J-tube for feeds without issues but yesterday the tube stopped working and became clogged.  The patient also has noticed leakage around the tube.  Patient denies any fevers or chills and denies any vomiting.  In the emergency room, her sodium was 161, creatinine was 2.1, white blood cell count 12.8, hemoglobin 7.9.  This morning her sodium is 157 and creatinine 2.06.  Her hemoglobin has gone down to 6 and is getting transfused a unit of red blood cells.  She had a CT scan last night that showed that the J-tube is in the position within the small bowel and there is no abscess in the subcutaneous space.  Past Medical History: Past Medical History:  Diagnosis Date   Asthma    Atrial flutter (HCC)    COPD (chronic obstructive pulmonary disease) (HCC)    Diabetes mellitus without complication (HCC)    GERD (gastroesophageal reflux disease)    History of colon polyps    Hypertension    Meniere disease    OSA (obstructive sleep apnea)    Renal cell carcinoma (HCC)    Sleep apnea    Went for sleep study test in January 2018 I haven't heard back from test     Past Surgical History: Past Surgical History:  Procedure Laterality Date   ABDOMINAL HYSTERECTOMY     BARIATRIC SURGERY     CHOLECYSTECTOMY     COLONOSCOPY WITH PROPOFOL  N/A 12/11/2016   Procedure: COLONOSCOPY WITH PROPOFOL ;  Surgeon: Lamar ONEIDA Holmes, MD;  Location: [facility] ENDOSCOPY;  Service: Endoscopy;  Laterality: N/A;   COLONOSCOPY WITH PROPOFOL  N/A 11/27/2021   Procedure: COLONOSCOPY WITH PROPOFOL ;  Surgeon: Maryruth Ole ONEIDA, MD;  Location: [facility] ENDOSCOPY;  Service: Endoscopy;  Laterality: N/A;    INSERTION, JEJUNOSTOMY TUBE, ROBOT-ASSISTED N/A 07/22/2024   Procedure: INSERTION, JEJUNOSTOMY TUBE, ROBOT-ASSISTED;  Surgeon: Jordis Laneta FALCON, MD;  Location: [facility] ORS;  Service: General;  Laterality: N/A;   IR BILIARY STENT(S) EXIST ACCESS INC DILAT CATH EXCH/REMOVAL  07/16/2024   IR CONVERT BILIARY DRAIN TO INT EXT BILIARY DRAIN  06/29/2024   IR EXCHANGE BILIARY DRAIN  06/22/2024   IR INT EXT BILIARY DRAIN WITH CHOLANGIOGRAM  06/09/2024   IR RADIOLOGIST EVAL & MGMT  06/15/2024    Home Medications: Prior to Admission medications   Medication Sig Start Date End Date Taking? Authorizing Provider  albuterol  (PROVENTIL ) (2.5 MG/3ML) 0.083% nebulizer solution Take 2.5 mg by nebulization every 4 (four) hours as needed for wheezing or shortness of breath. 04/05/24  Yes [provider]  apixaban  (ELIQUIS ) 5 MG TABS tablet Take 1 tablet (5 mg total) by mouth 2 (two) times daily. 07/01/24  Yes Patel, Sona, MD  dibucaine (NUPERCAINAL) 1 % OINT Place 1 Application rectally as needed for hemorrhoids.   Yes [provider]  dorzolamide  (TRUSOPT ) 2 % ophthalmic solution Place 1 drop into both eyes 2 (two) times daily.   Yes [provider]  famotidine  (PEPCID ) 20 MG tablet Place 1 tablet (20 mg total) into feeding tube 3 times/day as needed-between meals & bedtime for heartburn or indigestion. 08/03/24 09/02/24 Yes Dezii, Alexandra, DO  metoprolol  tartrate (LOPRESSOR ) 25 MG tablet Place 1 tablet (  25 mg total) into feeding tube 2 (two) times daily. 08/03/24 10/02/24 Yes Dezii, Alexandra, DO  Nutritional Supplements (FEEDING SUPPLEMENT, OSMOLITE 1.5 CAL,) LIQD 1,350 mLs by Per J Tube route daily. 08/04/24  Yes Dezii, Alexandra, DO  OLANZapine  (ZYPREXA ) 10 MG tablet Place 1 tablet (10 mg total) into feeding tube at bedtime. 08/03/24 09/02/24 Yes Dezii, Alexandra, DO  oxyCODONE  (OXY IR/ROXICODONE ) 5 MG immediate release tablet Take 5 mg by mouth every 8 (eight) hours as needed for moderate pain (pain score  4-6) or severe pain (pain score 7-10).   Yes [provider]  sodium chloride  flush 0.9 % SOLN injection Flush biliary cathter with 5-10mL saline daily to prevent clogging 06/11/24  Yes Wouk, Devaughn Sayres, MD  amiodarone  (PACERONE ) 200 MG tablet Take 1 tablet (200 mg total) by mouth daily. Patient not taking: Reported on 08/21/2024 04/20/24   Riddle, Suzann, NP  diazepam  (VALIUM ) 2 MG tablet Take 1 tablet (2 mg total) by mouth 2 (two) times daily as needed for anxiety. Home med. Patient not taking: Reported on 08/21/2024 04/01/24   Awanda City, MD  omeprazole (PRILOSEC) 40 MG capsule Take 40 mg by mouth daily. Patient not taking: Reported on 08/21/2024    [provider]  potassium chloride  (KLOR-CON ) 10 MEQ tablet Take 10 mEq by mouth daily. Patient not taking: Reported on 08/21/2024 04/09/23   [provider]  Testosterone 12.5 MG/ACT (1%) GEL Place 1 Pump onto the skin daily. Patient not taking: Reported on 08/21/2024 02/13/24   [provider]    Allergies: Allergies  Allergen Reactions   Latex Other (See Comments), Dermatitis, Hives and Swelling    unknown  Other reaction(s): Other (See Comments)  unknown  Other reaction(s): Other (See Comments) unknown    unknown   Aspirin Other (See Comments) and Nausea And Vomiting    Makes stomach burn  Other reaction(s): Other (See Comments)  Makes stomach burn  Makes stomach burn    Other reaction(s): Other (See Comments) Makes stomach burn   Plasticized Base [Plastibase]     Social History:  reports that she quit smoking about 30 years ago. Her smoking use included cigarettes. She has never used smokeless tobacco. She reports that she does not drink alcohol and does not use drugs.   Family History: Family History  Problem Relation Age of Onset   Hypertension Mother    Kidney disease Sister    Kidney disease Brother    Heart disease Brother    Heart disease Brother    Breast cancer Neg Hx      Review of Systems: Review of Systems  Constitutional:  Negative for chills and fever.  Respiratory:  Negative for shortness of breath.   Cardiovascular:  Negative for chest pain.  Gastrointestinal:  Negative for nausea and vomiting.       Malfunction of J-tube with leakage around tube.    Physical Exam BP (!) 122/109 (BP Location: Left Arm)   Pulse 100   Temp 98.8 F (37.1 C)   Resp 16   Ht 5' (1.524 m)   Wt 113.4 kg   SpO2 100%   BMI 48.82 kg/m  CONSTITUTIONAL: No acute distress HEENT:  Normocephalic, atraumatic, extraocular motion intact. RESPIRATORY:  Normal respiratory effort without pathologic use of accessory muscles. CARDIOVASCULAR: Regular rhythm and rate. GI: The abdomen is soft, obese, nondistended, without tenderness to palpation.  J-tube is in place with the insertion site at the skin being just a little bit wider than the tube  itself.  Suture securing the tube in place remains intact.  I was able to flush the J-tube with water  and also clean the adapter/components that connected to the feeding pump.  There is no evidence of obstruction.  There is no leakage around the tube when I was flushing it.  The skin around the tube does not show any erythema or induration.  NEUROLOGIC:  Motor and sensation is grossly normal.  Cranial nerves are grossly intact. PSYCH:  Alert and oriented to person, place and time. Affect is normal.  Laboratory Analysis: Results for orders placed or performed during the hospital encounter of 08/20/24 (from the past 24 hours)  CBC with Differential     Status: Abnormal   Collection Time: 08/20/24  2:38 PM  Result Value Ref Range   WBC 12.8 (H) 4.0 - 10.5 K/uL   RBC 2.83 (L) 3.87 - 5.11 MIL/uL   Hemoglobin 7.9 (L) 12.0 - 15.0 g/dL   HCT 73.2 (L) 63.9 - 53.9 %   MCV 94.3 80.0 - 100.0 fL   MCH 27.9 26.0 - 34.0 pg   MCHC 29.6 (L) 30.0 - 36.0 g/dL   RDW 81.1 (H) 88.4 - 84.4 %   Platelets 337 150 - 400 K/uL   nRBC 0.9 (H) 0.0 - 0.2 %    Neutrophils Relative % 64 %   Neutro Abs 8.1 (H) 1.7 - 7.7 K/uL   Lymphocytes Relative 23 %   Lymphs Abs 2.9 0.7 - 4.0 K/uL   Monocytes Relative 9 %   Monocytes Absolute 1.2 (H) 0.1 - 1.0 K/uL   Eosinophils Relative 1 %   Eosinophils Absolute 0.2 0.0 - 0.5 K/uL   Basophils Relative 0 %   Basophils Absolute 0.0 0.0 - 0.1 K/uL   Immature Granulocytes 3 %   Abs Immature Granulocytes 0.43 (H) 0.00 - 0.07 K/uL  Comprehensive metabolic panel     Status: Abnormal   Collection Time: 08/20/24  2:38 PM  Result Value Ref Range   Sodium 161 (HH) 135 - 145 mmol/L   Potassium 3.7 3.5 - 5.1 mmol/L   Chloride 123 (H) 98 - 111 mmol/L   CO2 24 22 - 32 mmol/L   Glucose, Bld 108 (H) 70 - 99 mg/dL   BUN 61 (H) 8 - 23 mg/dL   Creatinine, Ser 7.89 (H) 0.44 - 1.00 mg/dL   Calcium  8.8 (L) 8.9 - 10.3 mg/dL   Total Protein 6.5 6.5 - 8.1 g/dL   Albumin 1.7 (L) 3.5 - 5.0 g/dL   AST 23 15 - 41 U/L   ALT 12 0 - 44 U/L   Alkaline Phosphatase 120 38 - 126 U/L   Total Bilirubin 0.8 0.0 - 1.2 mg/dL   GFR, Estimated 25 (L) >60 mL/min   Anion gap 14 5 - 15  Troponin I (High Sensitivity)     Status: None   Collection Time: 08/20/24  8:21 PM  Result Value Ref Range   Troponin I (High Sensitivity) 7 <18 ng/L  Lipase, blood     Status: None   Collection Time: 08/20/24  8:21 PM  Result Value Ref Range   Lipase 16 11 - 51 U/L  CBG monitoring, ED     Status: Abnormal   Collection Time: 08/20/24  9:43 PM  Result Value Ref Range   Glucose-Capillary 100 (H) 70 - 99 mg/dL  Troponin I (High Sensitivity)     Status: None   Collection Time: 08/20/24  9:47 PM  Result Value Ref Range  Troponin I (High Sensitivity) 6 <18 ng/L  CBG monitoring, ED     Status: None   Collection Time: 08/21/24  1:33 AM  Result Value Ref Range   Glucose-Capillary 82 70 - 99 mg/dL  CBC     Status: Abnormal   Collection Time: 08/21/24  5:13 AM  Result Value Ref Range   WBC 11.3 (H) 4.0 - 10.5 K/uL   RBC 2.13 (L) 3.87 - 5.11 MIL/uL    Hemoglobin 6.0 (L) 12.0 - 15.0 g/dL   HCT 79.4 (L) 63.9 - 53.9 %   MCV 96.2 80.0 - 100.0 fL   MCH 28.2 26.0 - 34.0 pg   MCHC 29.3 (L) 30.0 - 36.0 g/dL   RDW 80.7 (H) 88.4 - 84.4 %   Platelets 232 150 - 400 K/uL   nRBC 0.5 (H) 0.0 - 0.2 %  CBG monitoring, ED     Status: None   Collection Time: 08/21/24  5:31 AM  Result Value Ref Range   Glucose-Capillary 83 70 - 99 mg/dL  Urinalysis, w/ Reflex to Culture (Infection Suspected) -Urine, Clean Catch     Status: Abnormal   Collection Time: 08/21/24  5:32 AM  Result Value Ref Range   Specimen Source URINE, CLEAN CATCH    Color, Urine YELLOW (A) YELLOW   APPearance HAZY (A) CLEAR   Specific Gravity, Urine 1.013 1.005 - 1.030   pH 5.0 5.0 - 8.0   Glucose, UA NEGATIVE NEGATIVE mg/dL   Hgb urine dipstick NEGATIVE NEGATIVE   Bilirubin Urine NEGATIVE NEGATIVE   Ketones, ur NEGATIVE NEGATIVE mg/dL   Protein, ur NEGATIVE NEGATIVE mg/dL   Nitrite NEGATIVE NEGATIVE   Leukocytes,Ua NEGATIVE NEGATIVE   RBC / HPF 0-5 0 - 5 RBC/hpf   WBC, UA 0-5 0 - 5 WBC/hpf   Bacteria, UA RARE (A) NONE SEEN   Squamous Epithelial / HPF 0-5 0 - 5 /HPF  Basic metabolic panel with GFR     Status: Abnormal   Collection Time: 08/21/24  6:15 AM  Result Value Ref Range   Sodium 157 (H) 135 - 145 mmol/L   Potassium 3.4 (L) 3.5 - 5.1 mmol/L   Chloride 121 (H) 98 - 111 mmol/L   CO2 21 (L) 22 - 32 mmol/L   Glucose, Bld 187 (H) 70 - 99 mg/dL   BUN 56 (H) 8 - 23 mg/dL   Creatinine, Ser 7.93 (H) 0.44 - 1.00 mg/dL   Calcium  8.1 (L) 8.9 - 10.3 mg/dL   GFR, Estimated 26 (L) >60 mL/min   Anion gap 15 5 - 15  CBG monitoring, ED     Status: Abnormal   Collection Time: 08/21/24  7:52 AM  Result Value Ref Range   Glucose-Capillary 109 (H) 70 - 99 mg/dL  Prepare RBC (crossmatch)     Status: None   Collection Time: 08/21/24 11:00 AM  Result Value Ref Range   Order Confirmation      ORDER PROCESSED BY BLOOD BANK Performed at [facility], [address]  [place], [place] [address]   Type and screen [facility]     Status: None (Preliminary result)   Collection Time: 08/21/24 11:04 AM  Result Value Ref Range   ABO/RH(D) PENDING    Antibody Screen PENDING    Sample Expiration      08/24/2024,2359 Performed at [facility], [address] [place], [place] [address]   CBG monitoring, ED     Status: Abnormal   Collection Time:  08/21/24 11:37 AM  Result Value Ref Range   Glucose-Capillary 115 (H) 70 - 99 mg/dL  Type and screen     Status: None (Preliminary result)   Collection Time: 08/21/24 11:39 AM  Result Value Ref Range   ABO/RH(D) O POS    Antibody Screen NEG    Sample Expiration      08/24/2024,2359 Performed at [facility], [address] [place], [place] [address]    Unit Number [id number]    Blood Component Type RED CELLS,LR    Unit division 00    Status of Unit ALLOCATED    Transfusion Status OK TO TRANSFUSE    Crossmatch Result Compatible    Unit Number [id number]    Blood Component Type RED CELLS,LR    Unit division 00    Status of Unit ALLOCATED    Transfusion Status OK TO TRANSFUSE    Crossmatch Result Compatible     Imaging: CT ABDOMEN PELVIS WO CONTRAST Result Date: 08/20/2024 EXAM: CT ABDOMEN AND PELVIS WITHOUT CONTRAST 08/20/2024 08:02:07 PM TECHNIQUE: CT of the abdomen and pelvis was performed without the administration of intravenous contrast. Multiplanar reformatted images are provided for review. Automated exposure control, iterative reconstruction, and/or weight-based adjustment of the mA/kV was utilized to reduce the radiation dose to as low as reasonably achievable. COMPARISON: CT abdomen and pelvis dated 08/01/2024. CLINICAL HISTORY: LUQ pain around J tube site with purulent drainage. Patient has feeding tube on left side of abdomen. Reports it worked fine in the AM and then around lunch the tube became clogged. FINDINGS: LOWER CHEST: No acute abnormality. LIVER:  The liver is unremarkable. GALLBLADDER AND BILE DUCTS: Gallbladder is surgically absent. Common bile duct stent is again seen with pneumobilia, unchanged. SPLEEN: No acute abnormality. PANCREAS: Pancreatic head and uncinate process mass measures up to 5.5 cm and appears grossly unchanged given lack of contrast. There may be trace inflammatory stranding involving the body and tail of the pancreas. ADRENAL GLANDS: No acute abnormality. KIDNEYS, URETERS AND BLADDER: Heterogeneous right renal mass measures 10.3 x 11.4 cm and appears grossly unchanged. Mild right-sided hydronephrosis is also unchanged likely secondary to mass effect from renal mass. Additional small cysts identified in the right kidney and left kidney appear unchanged from prior. No left-sided hydronephrosis. GI AND BOWEL: Her postsurgical changes in the stomach as seen on prior. Percutaneous jejunostomy tube in place, similar to prior. The appendix appears normal. PERITONEUM AND RETROPERITONEUM: No ascites. No free air. VASCULATURE: Aorta has atherosclerotic calcifications. LYMPH NODES: There are a few prominent central mesenteric lymph nodes similar to prior. REPRODUCTIVE ORGANS: Uterus is surgically absent. BONES AND SOFT TISSUES: No acute osseous abnormality. Left-sided body wall edema has mildly decreased. IMPRESSION: 1. Pancreatic head and uncinate process mass measuring up to 5.5 cm, grossly unchanged given lack of contrast, with possible trace inflammatory stranding in the body and tail of the pancreas. 2. Unchanged Heterogeneous right renal mass measuring 10.3 x 11.4 cm with mild right-sided hydronephrosis likely secondary to mass effect from the renal mass. 3. Common bile duct stent with pneumobilia, unchanged. 4. Left-sided body wall edema, mildly decreased. Electronically signed by: Greig Pique MD 08/20/2024 08:12 PM EDT RP Workstation: [id number]    Assessment and Plan: This is a 68 y.o. female with a question malfunctioning  J-tube.  - I was able to easily flush the J-tube.  I was able also to clean the components and everything flushed well together.  I think perhaps the adapter was clogged as it tube  itself is very easily flushable.  The tube itself appears to be in good position remains secured.  Think for now it is appropriate to continue with tube feeds and it is okay to use the feeding tube. - Based on her electrolytes, it may be that she just needs more free water  flushing to improve her hypernatremia. - No other surgical needs at this point.  General surgery team will sign off but feel free to reconsult or let us  know if any questions or concerns.  I spent 45 minutes dedicated to the care of this patient on the date of this encounter to include pre-visit review of records, face-to-face time with the patient discussing diagnosis and management, and any post-visit coordination of care.   Aloysius Sheree Plant, MD [facility] Pg:  [phone]

## 2024-08-21 NOTE — Progress Notes (Signed)
 Initial Nutrition Assessment  DOCUMENTATION CODES:   Morbid obesity  INTERVENTION:   -MVI with minerals daily -1 mg folic acid  daily -2 mg copper  daily x 60 days  -1000 units vitamin A daily -Continue nocturnal feedings via j-tube:   Osmolite 1.5 at 75 ml/hr x 18 hours (1350 mL in 24 hours); Infuse from 1500 to 0900 every 24 hours. Continue Pro-Soure TF20 60 mL daily for now (may not be able to as outpatient) 120 ml free water  flush every 4 hours TF plus Pro-Source provides 2105 kcals, 105 g of protein and 1026 mL of free water . Total free water : 1746 ml daily  NUTRITION DIAGNOSIS:   Inadequate oral intake related to altered GI function as evidenced by NPO status.  GOAL:   Patient will meet greater than or equal to 90% of their needs  MONITOR:   TF tolerance  REASON FOR ASSESSMENT:   Consult Enteral/tube feeding initiation and management  ASSESSMENT:   68 y.o. female with medical history hypertension, OSA, morbid obesity with history of Roux-en-Y, CKD stage IIIb renal cell carcinoma,  malignant neoplasm of the pancreas, paroxysmal A-fib on Eliquis ,  , hospitalized a month ago (07/13/2024 - 08/03/2024) with intractable vomiting and inability to tolerate p.o. due to large obstructing pancreatic mass s/p biliary stent 07/16/2024,s/p J-tube placement on 07/22/2024, being admitted with concerns for G-tube dysfunction with cellulitis around G-tube site, dehydration/AKI/hypernatremia.  Patient admitted with j-tube dysfunction and hyponatremia.   9/19- s/p Cholangiogram through biliary drain; common bile duct stent placement with covered metallic stent  9/25- s/p j-tube placement by surgery 9/26- TF held, x-ray confirmed j-tube in good position, TF resumed 9/27- TF held secondary to vomiting 9/29- contrast study revealed no evidence of obstruction from jejunostomy although no progression of PO contrast  Reviewed I/O's: +280 ml x 24 hours  UOP: 220 ml x 24 hours  Patient  unavailable at time of visit. Attempted to speak with patient via call to hospital room phone, however, unable to reach. RD unable to obtain further nutrition-related history or complete nutrition-focused physical exam at this time.    Per MD notes from prior admission, remains high risk for recurrence of abdominal wall/stomach bloating given nearly obstructive mass.   Ms Elizondo is very familiar to RD due to recently prolonged hospital admission. pt with pancreatic mass with obstructive jaundice status post biliary drain. Oncology reports pancreatic mass had grown further with a CA 19-9 that was significantly elevated raising the concern for pancreatic cancer. This pancreatic mass was also abutting the gastric antrum and duodenum. This was complicated by obstructive jaundice requiring PTC which was recently changed for an internal stent by IR. Per general surgery notes, pt with prior history of gastric sleeve and recommended j-tube placement. Per CareEverywhere, pt underwent gastric sleeve on 10/15/24 at [facility].   Per H&P, patient reports pain in the j-tune site area about 3 days PTA and tube appeared blocked 1 days PTA; leaking noted. General surgery examined tube today and was able to flush tube without difficulty. Plan to resume TF today.  Reviewed weight history; patient has experienced a 10.2% weight loss over the past 4 months, which is significant for time frame. Per nursing assessment, patient with moderate edema, which may be masking true weight loss as well as fat and muscle depletions.    Lab Results  Component Value Date   HGBA1C 5.5 06/09/2024   PTA DM medications are none.   Labs reviewed: Na: 157, K: 3.4. Inpatient orders for glycemic  control are 0-15 units inulin aspart every 4 hours. Per labs on 08/02/24, Vitamin: 6.9, CRP: 10.7, ceruloplasm: 18.   Diet Order:   Diet Order             Diet NPO time specified  Diet effective now                   EDUCATION NEEDS:    No education needs have been identified at this time  Skin:  Skin Integrity Issues:: Incisions Incisions: closed abdomen  Last BM:  08/20/24  Height:   Ht Readings from Last 1 Encounters:  08/20/24 5' (1.524 m)    Weight:   Wt Readings from Last 1 Encounters:  08/20/24 113.4 kg    Ideal Body Weight:  45.5 kg  BMI:  Body mass index is 48.82 kg/m.  Estimated Nutritional Needs:   Kcal:  1850-2050  Protein:  100-115 grams  Fluid:  1.8-2.0 L    Margery ORN, RD, LDN, CDCES Registered Dietitian III Certified Diabetes Care and Education Specialist If unable to reach this RD, please use RD Inpatient group chat on secure chat between hours of 8am-4 pm daily

## 2024-08-21 NOTE — ED Notes (Signed)
 Called IV team regarding pt's midline IV. Pt's D5 &  0.45% NaCL infusion is infusing through the 20G IV in the right AC. This RN pulled blood for labs in the Right basillic IV. Lab called and said that pt's blood glucose resulted at a critically high result. This RN redrew the labs. This RN took the ultrasound machine to determine if saline flushed through the same vein in both IV's, which it appeared it did. IV team states that since there is 4 finger widths lengths between the catheters, then giving antibiotics/IV fluids that are not compatible with each other in the same vein are indeed safe due to the length between the sites.

## 2024-08-21 NOTE — Progress Notes (Signed)
 Progress Note   Patient: Alyssa Leonard FMW:[id number] DOB: July 04, 1956 DOA: 08/20/2024     0 DOS: the patient was seen and examined on 08/21/2024   Brief hospital course: From HPI Alyssa Leonard is a 68 y.o. female with medical history hypertension, OSA, morbid obesity with history of Roux-en-Y, CKD stage IIIb renal cell carcinoma,  malignant neoplasm of the pancreas, paroxysmal A-fib on Eliquis ,  , hospitalized a month ago (07/13/2024 - 08/03/2024) with intractable vomiting and inability to tolerate p.o. due to large obstructing pancreatic mass s/p biliary stent 07/16/2024,s/p J-tube placement on 07/22/2024, being admitted with concerns for G-tube dysfunction with cellulitis around G-tube site, dehydration/AKI/hypernatremia.  Patient states she started having pain in the area of the J tube insertion about 3 days ago and then yesterday, the tube appeared to be blocked and started leaking around the insertion site. She has received no feeds in over 24 hours. She denies fever or chills.  She denies vomiting.    In the ED, afebrile tachycardic to 124, BP 128/63 saturating at 100% on room air Labs notable for WBC 12,000, hemoglobin 7.9 (8.4 couple weeks prior), CMP notable for sodium 161, BUN/creatinine 61/2.10 up from 33/1.65 a couple weeks prior LFTs improved from a couple weeks prior.  Lipase pending. EKG showing sinus tachycardia at 102 CT abdomen and pelvis showing grossly unchanged pancreatic head and uncinate process mass measuring up to 5.5 cm, unchanged right renal mass, common bile duct stent with pneumobilia unchanged.   Patient was treated with an LR bolus x 2, morphine  for pain, started on Zosyn and vancomycin    Admission requested.    G-tube site has been evaluated by general surgeon as well as IR.  Tube have been checked and currently functioning well.  Have been cleared for tube to be restarted  Assessment and Plan: Cellulitis at gastrostomy tube site [facility]) Patient reports pain at  tube insertion site Has mild leukocytosis Continue IV vancomycin  and Zosyn Skin barrier precautions   Gastrostomy tube dysfunction [facility]) Patient reports clogged leaking tube IR consulted for replacement Holding tube feeds Holding Eliquis  for procedure   Hypernatremia-improving Sodium 161 Likely secondary to poor intake from malfunctioning G-tube Continue IV hydration with D5 half NS Monitor sodium   Acute renal failure superimposed on stage 3b chronic kidney disease (HCC) BUN/creatinine 61/2.10 up from 33/1.65 a couple weeks Secondary to inadequate tube feeds with resulting dehydration IV hydration Monitor renal function and avoid nephrotoxins   Carcinoma of head of pancreas (HCC) S/p metallic biliary stent on 07/16/2024 No acute issues, LFTs appears improved Patient is G-tube dependent due to obstructive mass   Paroxysmal atrial flutter (HCC) Holding Eliquis  due to plans for IR procedure/J-tube replacement in the a.m. Holding metoprolol  due to tube malfunction Will give IV metoprolol  as needed for rate control   Hypertension Hydralazine  IV as needed while off tube feeds   Renal cell carcinoma (HCC) Mass stable on CT No acute issues suspected   Anemia of chronic disease 7.9, down from 8.4 couple weeks prior Will continue to monitor   Asthma, chronic Not acutely exacerbated Albuterol  as needed   Diabetes mellitus without complication (HCC) Sliding scale insulin  coverage   Obesity, Class III, BMI 40-49.9 (morbid obesity) (HCC) History of gastric bypass surgery Complicating factor to overall prognosis and care   DVT prophylaxis: SCD   Consults: IR   Advance Care Planning:   Code Status: Prior    Family Communication: none   Disposition Plan: Back to previous home environment  Subjective:  Seen and examined at bedside this morning Denies nausea vomiting did have some abdominal pain  Physical Exam: Constitutional:      General: She is not in acute  distress.    Appearance: She is morbidly obese.  HENT:     Head: Normocephalic and atraumatic.  Cardiovascular:     Rate and Rhythm: Normal rate and regular rhythm.     Heart sounds: Normal heart sounds.  Pulmonary:     Effort: Pulmonary effort is normal.     Breath sounds: Normal breath sounds.  Abdominal:     Palpations: Abdomen is soft.     Tenderness: There is no abdominal tenderness.     Comments: Some drainage around the tube site noted Neurological:     Mental Status: Mental status is at baseline.  Vitals:   08/21/24 1001 08/21/24 1250 08/21/24 1406 08/21/24 1423  BP: 129/75 (!) 122/109 123/86 118/77  Pulse: 97 100 (!) 106 (!) 102  Resp: 14 16 18 18   Temp: (!) 96.9 F (36.1 C) 98.8 F (37.1 C) (!) 97.5 F (36.4 C) (!) 97.5 F (36.4 C)  TempSrc: Axillary     SpO2: 100% 100%  98%  Weight:      Height:        Data Reviewed: CT scan reviewed did not show any abscess    Latest Ref Rng & Units 08/21/2024    5:13 AM 08/20/2024    2:38 PM 08/03/2024    4:36 AM  CBC  WBC 4.0 - 10.5 K/uL 11.3  12.8  16.9   Hemoglobin 12.0 - 15.0 g/dL 6.0  7.9  8.4   Hematocrit 36.0 - 46.0 % 20.5  26.7  25.4   Platelets 150 - 400 K/uL 232  337  253        Latest Ref Rng & Units 08/21/2024    6:15 AM 08/20/2024    2:38 PM 08/03/2024    4:36 AM  BMP  Glucose 70 - 99 mg/dL 812  891  844   BUN 8 - 23 mg/dL 56  61  33   Creatinine 0.44 - 1.00 mg/dL 7.93  7.89  8.34   Sodium 135 - 145 mmol/L 157  161  140   Potassium 3.5 - 5.1 mmol/L 3.4  3.7  5.2   Chloride 98 - 111 mmol/L 121  123  108   CO2 22 - 32 mmol/L 21  24  26    Calcium  8.9 - 10.3 mg/dL 8.1  8.8  7.9       Author: Drue ONEIDA Potter, MD 08/21/2024 4:23 PM  For on call review [email or web address].

## 2024-08-21 NOTE — ED Notes (Signed)
Consent for blood signed electronically.  

## 2024-08-22 DIAGNOSIS — C649 Malignant neoplasm of unspecified kidney, except renal pelvis: Secondary | ICD-10-CM | POA: Diagnosis present

## 2024-08-22 DIAGNOSIS — E860 Dehydration: Secondary | ICD-10-CM | POA: Diagnosis present

## 2024-08-22 DIAGNOSIS — E870 Hyperosmolality and hypernatremia: Secondary | ICD-10-CM | POA: Diagnosis present

## 2024-08-22 DIAGNOSIS — N179 Acute kidney failure, unspecified: Secondary | ICD-10-CM | POA: Diagnosis present

## 2024-08-22 DIAGNOSIS — Y833 Surgical operation with formation of external stoma as the cause of abnormal reaction of the patient, or of later complication, without mention of misadventure at the time of the procedure: Secondary | ICD-10-CM | POA: Diagnosis present

## 2024-08-22 DIAGNOSIS — K9423 Gastrostomy malfunction: Secondary | ICD-10-CM | POA: Diagnosis present

## 2024-08-22 DIAGNOSIS — E1122 Type 2 diabetes mellitus with diabetic chronic kidney disease: Secondary | ICD-10-CM | POA: Diagnosis present

## 2024-08-22 DIAGNOSIS — E43 Unspecified severe protein-calorie malnutrition: Secondary | ICD-10-CM | POA: Diagnosis present

## 2024-08-22 DIAGNOSIS — I480 Paroxysmal atrial fibrillation: Secondary | ICD-10-CM | POA: Diagnosis present

## 2024-08-22 DIAGNOSIS — D630 Anemia in neoplastic disease: Secondary | ICD-10-CM | POA: Diagnosis present

## 2024-08-22 DIAGNOSIS — Z87891 Personal history of nicotine dependence: Secondary | ICD-10-CM | POA: Diagnosis not present

## 2024-08-22 DIAGNOSIS — J4489 Other specified chronic obstructive pulmonary disease: Secondary | ICD-10-CM | POA: Diagnosis present

## 2024-08-22 DIAGNOSIS — N1832 Chronic kidney disease, stage 3b: Secondary | ICD-10-CM | POA: Diagnosis present

## 2024-08-22 DIAGNOSIS — E66813 Obesity, class 3: Secondary | ICD-10-CM | POA: Diagnosis present

## 2024-08-22 DIAGNOSIS — I4892 Unspecified atrial flutter: Secondary | ICD-10-CM | POA: Diagnosis present

## 2024-08-22 DIAGNOSIS — C250 Malignant neoplasm of head of pancreas: Secondary | ICD-10-CM | POA: Diagnosis present

## 2024-08-22 DIAGNOSIS — I129 Hypertensive chronic kidney disease with stage 1 through stage 4 chronic kidney disease, or unspecified chronic kidney disease: Secondary | ICD-10-CM | POA: Diagnosis present

## 2024-08-22 DIAGNOSIS — L03311 Cellulitis of abdominal wall: Secondary | ICD-10-CM | POA: Diagnosis present

## 2024-08-22 DIAGNOSIS — E871 Hypo-osmolality and hyponatremia: Secondary | ICD-10-CM | POA: Diagnosis present

## 2024-08-22 DIAGNOSIS — K9412 Enterostomy infection: Secondary | ICD-10-CM | POA: Diagnosis present

## 2024-08-22 DIAGNOSIS — Z85528 Personal history of other malignant neoplasm of kidney: Secondary | ICD-10-CM | POA: Diagnosis not present

## 2024-08-22 DIAGNOSIS — Z7901 Long term (current) use of anticoagulants: Secondary | ICD-10-CM | POA: Diagnosis not present

## 2024-08-22 DIAGNOSIS — K9413 Enterostomy malfunction: Secondary | ICD-10-CM | POA: Diagnosis present

## 2024-08-22 DIAGNOSIS — Z6841 Body Mass Index (BMI) 40.0 and over, adult: Secondary | ICD-10-CM | POA: Diagnosis not present

## 2024-08-22 DIAGNOSIS — Z8249 Family history of ischemic heart disease and other diseases of the circulatory system: Secondary | ICD-10-CM | POA: Diagnosis not present

## 2024-08-22 DIAGNOSIS — Z886 Allergy status to analgesic agent status: Secondary | ICD-10-CM | POA: Diagnosis not present

## 2024-08-22 LAB — TYPE AND SCREEN
ABO/RH(D): O POS
Antibody Screen: NEGATIVE
Unit division: 0
Unit division: 0

## 2024-08-22 LAB — BPAM RBC
Blood Product Expiration Date: 202511252359
Blood Product Expiration Date: 202511252359
ISSUE DATE / TIME: 202510251402
ISSUE DATE / TIME: 202510251634
Unit Type and Rh: 5100
Unit Type and Rh: 5100

## 2024-08-22 LAB — CBC WITH DIFFERENTIAL/PLATELET
Abs Immature Granulocytes: 0.46 K/uL — ABNORMAL HIGH (ref 0.00–0.07)
Basophils Absolute: 0 K/uL (ref 0.0–0.1)
Basophils Relative: 0 %
Eosinophils Absolute: 0.4 K/uL (ref 0.0–0.5)
Eosinophils Relative: 3 %
HCT: 29.1 % — ABNORMAL LOW (ref 36.0–46.0)
Hemoglobin: 9.2 g/dL — ABNORMAL LOW (ref 12.0–15.0)
Immature Granulocytes: 4 %
Lymphocytes Relative: 16 %
Lymphs Abs: 1.9 K/uL (ref 0.7–4.0)
MCH: 29 pg (ref 26.0–34.0)
MCHC: 31.6 g/dL (ref 30.0–36.0)
MCV: 91.8 fL (ref 80.0–100.0)
Monocytes Absolute: 1.1 K/uL — ABNORMAL HIGH (ref 0.1–1.0)
Monocytes Relative: 9 %
Neutro Abs: 8.1 K/uL — ABNORMAL HIGH (ref 1.7–7.7)
Neutrophils Relative %: 68 %
Platelets: 252 K/uL (ref 150–400)
RBC: 3.17 MIL/uL — ABNORMAL LOW (ref 3.87–5.11)
RDW: 18.3 % — ABNORMAL HIGH (ref 11.5–15.5)
WBC: 12 K/uL — ABNORMAL HIGH (ref 4.0–10.5)
nRBC: 0.8 % — ABNORMAL HIGH (ref 0.0–0.2)

## 2024-08-22 LAB — BASIC METABOLIC PANEL WITH GFR
Anion gap: 14 (ref 5–15)
BUN: 54 mg/dL — ABNORMAL HIGH (ref 8–23)
CO2: 22 mmol/L (ref 22–32)
Calcium: 8.1 mg/dL — ABNORMAL LOW (ref 8.9–10.3)
Chloride: 123 mmol/L — ABNORMAL HIGH (ref 98–111)
Creatinine, Ser: 2.05 mg/dL — ABNORMAL HIGH (ref 0.44–1.00)
GFR, Estimated: 26 mL/min — ABNORMAL LOW (ref >60)
Glucose, Bld: 232 mg/dL — ABNORMAL HIGH (ref 70–99)
Potassium: 3.2 mmol/L — ABNORMAL LOW (ref 3.5–5.1)
Sodium: 159 mmol/L — ABNORMAL HIGH (ref 135–145)

## 2024-08-22 LAB — MAGNESIUM: Magnesium: 2.4 mg/dL (ref 1.7–2.4)

## 2024-08-22 LAB — GLUCOSE, CAPILLARY
Glucose-Capillary: 154 mg/dL — ABNORMAL HIGH (ref 70–99)
Glucose-Capillary: 154 mg/dL — ABNORMAL HIGH (ref 70–99)
Glucose-Capillary: 159 mg/dL — ABNORMAL HIGH (ref 70–99)
Glucose-Capillary: 209 mg/dL — ABNORMAL HIGH (ref 70–99)
Glucose-Capillary: 210 mg/dL — ABNORMAL HIGH (ref 70–99)
Glucose-Capillary: 223 mg/dL — ABNORMAL HIGH (ref 70–99)

## 2024-08-22 LAB — PHOSPHORUS: Phosphorus: 3.8 mg/dL (ref 2.5–4.6)

## 2024-08-22 LAB — VANCOMYCIN, RANDOM: Vancomycin Rm: 22 ug/mL

## 2024-08-22 MED ORDER — METOPROLOL TARTRATE 25 MG PO TABS
25.0000 mg | ORAL_TABLET | Freq: Two times a day (BID) | ORAL | Status: DC
  Administered 2024-08-22 – 2024-08-24 (×4): 25 mg
  Filled 2024-08-22 (×4): qty 1

## 2024-08-22 MED ORDER — OXYCODONE HCL 5 MG PO TABS: 5.0000 mg | ORAL_TABLET | Freq: Three times a day (TID) | ORAL | Status: DC | PRN

## 2024-08-22 MED ORDER — APIXABAN 5 MG PO TABS: 5.0000 mg | ORAL_TABLET | Freq: Two times a day (BID) | ORAL | Status: DC

## 2024-08-22 MED ORDER — APIXABAN 5 MG PO TABS
5.0000 mg | ORAL_TABLET | Freq: Two times a day (BID) | ORAL | Status: DC
  Administered 2024-08-22 – 2024-08-24 (×4): 5 mg
  Filled 2024-08-22 (×4): qty 1

## 2024-08-22 MED ORDER — OLANZAPINE 10 MG PO TABS
10.0000 mg | ORAL_TABLET | Freq: Every day | ORAL | Status: DC
  Administered 2024-08-22 – 2024-08-23 (×2): 10 mg
  Filled 2024-08-22 (×2): qty 1

## 2024-08-22 NOTE — Plan of Care (Signed)

## 2024-08-22 NOTE — Progress Notes (Signed)
 Progress Note   Patient: Alyssa Leonard FMW:[id number] DOB: 1955/11/11 DOA: 08/20/2024     0 DOS: the patient was seen and examined on 08/22/2024     Brief hospital course: From HPI Alyssa Leonard is a 68 y.o. female with medical history hypertension, OSA, morbid obesity with history of Roux-en-Y, CKD stage IIIb renal cell carcinoma,  malignant neoplasm of the pancreas, paroxysmal A-fib on Eliquis ,  , hospitalized a month ago (07/13/2024 - 08/03/2024) with intractable vomiting and inability to tolerate p.o. due to large obstructing pancreatic mass s/p biliary stent 07/16/2024,s/p J-tube placement on 07/22/2024, being admitted with concerns for G-tube dysfunction with cellulitis around G-tube site, dehydration/AKI/hypernatremia.  Patient states she started having pain in the area of the J tube insertion about 3 days ago and then yesterday, the tube appeared to be blocked and started leaking around the insertion site. She has received no feeds in over 24 hours. She denies fever or chills.  She denies vomiting.     In the ED, afebrile tachycardic to 124, BP 128/63 saturating at 100% on room air Labs notable for WBC 12,000, hemoglobin 7.9 (8.4 couple weeks prior), CMP notable for sodium 161, BUN/creatinine 61/2.10 up from 33/1.65 a couple weeks prior LFTs improved from a couple weeks prior.  Lipase pending. EKG showing sinus tachycardia at 102 CT abdomen and pelvis showing grossly unchanged pancreatic head and uncinate process mass measuring up to 5.5 cm, unchanged right renal mass, common bile duct stent with pneumobilia unchanged.   Patient was treated with an LR bolus x 2, morphine  for pain, started on Zosyn and vancomycin    Admission requested.        Assessment and Plan: Cellulitis at gastrostomy tube site [facility]) Patient reports pain at tube insertion site Has mild leukocytosis Continue IV vancomycin  and Zosyn Skin barrier precautions   Gastrostomy tube dysfunction (HCC) G-tube site has  been evaluated by general surgeon as well as IR.  Tube have been checked and currently functioning well.  Have been cleared for tube to be restarted Continue tube feeding   Hypernatremia-improving Sodium level improving Continue D5W ordered Continue to monitor sodium level closely   Acute renal failure superimposed on stage 3b chronic kidney disease (HCC) BUN/creatinine 61/2.10 up from 33/1.65 a couple weeks Secondary to inadequate tube feeds with resulting dehydration Continue IV hydration Monitor renal function and avoid nephrotoxins   Carcinoma of head of pancreas (HCC) S/p metallic biliary stent on 07/16/2024 No acute issues, LFTs appears improved Patient is G-tube dependent due to obstructive mass   Paroxysmal atrial flutter (HCC) Continue Eliquis  Continue metoprolol    Hypertension Hydralazine  IV as needed while off tube feeds   Renal cell carcinoma (HCC) Mass stable on CT No acute issues suspected   Anemia of chronic disease Patient is status post 2 units of blood transfusion on 08/21/2025   Asthma, chronic Not acutely exacerbated Albuterol  as needed   Diabetes mellitus without complication (HCC) Sliding scale insulin  coverage   Obesity, Class III, BMI 40-49.9 (morbid obesity) (HCC) History of gastric bypass surgery Complicating factor to overall prognosis and care   DVT prophylaxis: SCD   Consults: IR   Advance Care Planning:   Code Status: Prior    Family Communication: none   Disposition Plan: Back to previous home environment     Subjective:  Seen and examined at bedside this morning She denies nausea vomiting did have some abdominal pain   Physical Exam: Constitutional:      General: She is not  in acute distress.    Appearance: She is morbidly obese.  HENT:     Head: Normocephalic and atraumatic.  Cardiovascular:     Rate and Rhythm: Normal rate and regular rhythm.     Heart sounds: Normal heart sounds.  Pulmonary:     Effort: Pulmonary  effort is normal.     Breath sounds: Normal breath sounds.  Abdominal:     Palpations: Abdomen is soft.     Tenderness: There is no abdominal tenderness.     Comments: Some drainage around the tube site noted Neurological:     Mental Status: Mental status is at baseline.    Data Reviewed: CT scan reviewed did not show any abscess  Vitals:   08/21/24 1654 08/21/24 1924 08/22/24 0413 08/22/24 0500  BP: 133/84 (!) 130/97 (!) 131/93   Pulse: 99 100 95   Resp: 18 18 18    Temp: 98 F (36.7 C) (!) 96.2 F (35.7 C) 97.6 F (36.4 C)   TempSrc: Oral Axillary    SpO2: 99% 100% 100%   Weight:    109.1 kg  Height:          Latest Ref Rng & Units 08/22/2024    7:27 AM 08/21/2024    6:15 AM 08/20/2024    2:38 PM  BMP  Glucose 70 - 99 mg/dL 767  812  891   BUN 8 - 23 mg/dL 54  56  61   Creatinine 0.44 - 1.00 mg/dL 7.94  7.93  7.89   Sodium 135 - 145 mmol/L 159  157  161   Potassium 3.5 - 5.1 mmol/L 3.2  3.4  3.7   Chloride 98 - 111 mmol/L 123  121  123   CO2 22 - 32 mmol/L 22  21  24    Calcium  8.9 - 10.3 mg/dL 8.1  8.1  8.8        Latest Ref Rng & Units 08/22/2024    7:27 AM 08/21/2024    5:13 AM 08/20/2024    2:38 PM  CBC  WBC 4.0 - 10.5 K/uL 12.0  11.3  12.8   Hemoglobin 12.0 - 15.0 g/dL 9.2  6.0  7.9   Hematocrit 36.0 - 46.0 % 29.1  20.5  26.7   Platelets 150 - 400 K/uL 252  232  337      Author: Drue ONEIDA Potter, MD 08/22/2024 8:55 AM  For on call review [email or web address].

## 2024-08-23 DIAGNOSIS — K9423 Gastrostomy malfunction: Secondary | ICD-10-CM | POA: Diagnosis not present

## 2024-08-23 LAB — CBC WITH DIFFERENTIAL/PLATELET
Abs Immature Granulocytes: 0.46 K/uL — ABNORMAL HIGH (ref 0.00–0.07)
Basophils Absolute: 0.1 K/uL (ref 0.0–0.1)
Basophils Relative: 0 %
Eosinophils Absolute: 0.4 K/uL (ref 0.0–0.5)
Eosinophils Relative: 3 %
HCT: 29.7 % — ABNORMAL LOW (ref 36.0–46.0)
Hemoglobin: 9.2 g/dL — ABNORMAL LOW (ref 12.0–15.0)
Immature Granulocytes: 4 %
Lymphocytes Relative: 17 %
Lymphs Abs: 2 K/uL (ref 0.7–4.0)
MCH: 28.8 pg (ref 26.0–34.0)
MCHC: 31 g/dL (ref 30.0–36.0)
MCV: 92.8 fL (ref 80.0–100.0)
Monocytes Absolute: 1.1 K/uL — ABNORMAL HIGH (ref 0.1–1.0)
Monocytes Relative: 9 %
Neutro Abs: 7.6 K/uL (ref 1.7–7.7)
Neutrophils Relative %: 67 %
Platelets: 236 K/uL (ref 150–400)
RBC: 3.2 MIL/uL — ABNORMAL LOW (ref 3.87–5.11)
RDW: 18.3 % — ABNORMAL HIGH (ref 11.5–15.5)
WBC: 11.5 K/uL — ABNORMAL HIGH (ref 4.0–10.5)
nRBC: 0.4 % — ABNORMAL HIGH (ref 0.0–0.2)

## 2024-08-23 LAB — GLUCOSE, CAPILLARY
Glucose-Capillary: 123 mg/dL — ABNORMAL HIGH (ref 70–99)
Glucose-Capillary: 125 mg/dL — ABNORMAL HIGH (ref 70–99)
Glucose-Capillary: 135 mg/dL — ABNORMAL HIGH (ref 70–99)
Glucose-Capillary: 156 mg/dL — ABNORMAL HIGH (ref 70–99)
Glucose-Capillary: 164 mg/dL — ABNORMAL HIGH (ref 70–99)
Glucose-Capillary: 98 mg/dL (ref 70–99)
Glucose-Capillary: 98 mg/dL (ref 70–99)

## 2024-08-23 LAB — BASIC METABOLIC PANEL WITH GFR
Anion gap: 9 (ref 5–15)
BUN: 44 mg/dL — ABNORMAL HIGH (ref 8–23)
CO2: 23 mmol/L (ref 22–32)
Calcium: 7.9 mg/dL — ABNORMAL LOW (ref 8.9–10.3)
Chloride: 119 mmol/L — ABNORMAL HIGH (ref 98–111)
Creatinine, Ser: 1.67 mg/dL — ABNORMAL HIGH (ref 0.44–1.00)
GFR, Estimated: 33 mL/min — ABNORMAL LOW (ref >60)
Glucose, Bld: 137 mg/dL — ABNORMAL HIGH (ref 70–99)
Potassium: 3.9 mmol/L (ref 3.5–5.1)
Sodium: 151 mmol/L — ABNORMAL HIGH (ref 135–145)

## 2024-08-23 LAB — VANCOMYCIN, RANDOM: Vancomycin Rm: 17 ug/mL

## 2024-08-23 LAB — MAGNESIUM: Magnesium: 2.5 mg/dL — ABNORMAL HIGH (ref 1.7–2.4)

## 2024-08-23 LAB — PHOSPHORUS: Phosphorus: 2.6 mg/dL (ref 2.5–4.6)

## 2024-08-23 MED ORDER — PANCRELIPASE (LIP-PROT-AMYL) 10440-39150 UNITS PO TABS
20880.0000 [IU] | ORAL_TABLET | Freq: Once | ORAL | Status: AC
  Administered 2024-08-23: 20880 [IU]
  Filled 2024-08-23: qty 2

## 2024-08-23 MED ORDER — SODIUM BICARBONATE 650 MG PO TABS
650.0000 mg | ORAL_TABLET | Freq: Once | ORAL | Status: AC
Start: 2024-08-23 — End: 2024-08-23
  Administered 2024-08-23: 650 mg
  Filled 2024-08-23: qty 1

## 2024-08-23 MED ORDER — DEXTROSE 5 % IV SOLN: INTRAVENOUS | Status: AC

## 2024-08-23 MED ORDER — VANCOMYCIN HCL 1250 MG/250ML IV SOLN
1250.0000 mg | INTRAVENOUS | Status: DC
  Administered 2024-08-23: 1250 mg via INTRAVENOUS
  Filled 2024-08-23: qty 250

## 2024-08-23 MED ORDER — LOPERAMIDE HCL 2 MG PO CAPS
4.0000 mg | ORAL_CAPSULE | Freq: Once | ORAL | Status: DC
Start: 2024-08-23 — End: 2024-08-24

## 2024-08-23 NOTE — Consult Note (Signed)
 Pharmacy Antibiotic Note  Alyssa Leonard is a 68 y.o. female admitted on 08/20/2024 with cellulitis at G tube site.  Pharmacy has been consulted for vancomycin  dosing.  AKI on CKD, Scr 2.1-->1.67 (1.65 08/03/24)  Plan: Vancomycin  1250 mg IV Q 48 hrs. Goal AUC 400-550. Expected AUC: 479.8 Expected Cmin:  SCr used: 1.67, Vd used: 0.5  Continue ceftriaxone  1 gram IV every 24 hours per provider Follow renal function and cultures for adjustments  Height: 5' (152.4 cm) Weight: 110.3 kg (243 lb 2.7 oz) IBW/kg (Calculated) : 45.5  Temp (24hrs), Avg:97.8 F (36.6 C), Min:97.6 F (36.4 C), Max:98 F (36.7 C)  Recent Labs  Lab 08/20/24 1438 08/21/24 0513 08/21/24 0615 08/22/24 0727 08/23/24 0410  WBC 12.8* 11.3*  --  12.0* 11.5*  CREATININE 2.10*  --  2.06* 2.05* 1.67*  VANCORANDOM  --   --   --  22 17    Estimated Creatinine Clearance: 36.3 mL/min (A) (by C-G formula based on SCr of 1.67 mg/dL (H)).    Allergies  Allergen Reactions   Latex Other (See Comments), Dermatitis, Hives and Swelling    unknown  Other reaction(s): Other (See Comments)  unknown  Other reaction(s): Other (See Comments) unknown    unknown   Aspirin Other (See Comments) and Nausea And Vomiting    Makes stomach burn  Other reaction(s): Other (See Comments)  Makes stomach burn  Makes stomach burn    Other reaction(s): Other (See Comments) Makes stomach burn   Plasticized Base [Plastibase]     Antimicrobials this admission: Ceftriaxone  10/25 >>  vancomycin  10/24 >>    Microbiology results: N/A  Thank you for allowing pharmacy to be a part of this patient's care.  Wilver Tignor A Shanik Brookshire, PharmD 08/23/2024 9:51 AM

## 2024-08-23 NOTE — Progress Notes (Signed)
 Nutrition Follow-up  DOCUMENTATION CODES:   Morbid obesity  INTERVENTION:   -Continue MVI with minerals daily -Continue 1 mg folic acid  daily -Continue 2 mg copper  daily x 60 days  -Continue 1000 units vitamin A daily -Continue nocturnal feedings via j-tube:   Osmolite 1.5 at 75 ml/hr x 18 hours (1350 mL in 24 hours); Infuse from 1500 to 0900 every 24 hours. Continue Pro-Soure TF20 60 mL daily for now (may not be able to as outpatient) 120 ml free water  flush every 4 hours TF plus Pro-Source provides 2105 kcals, 105 g of protein and 1026 mL of free water . Total free water : 1746 ml daily  NUTRITION DIAGNOSIS:   Inadequate oral intake related to altered GI function as evidenced by NPO status.  Ongoing  GOAL:   Patient will meet greater than or equal to 90% of their needs  Met with TF  MONITOR:   TF tolerance  REASON FOR ASSESSMENT:   Consult Enteral/tube feeding initiation and management  ASSESSMENT:   68 y.o. female with medical history hypertension, OSA, morbid obesity with history of Roux-en-Y, CKD stage IIIb renal cell carcinoma,  malignant neoplasm of the pancreas, paroxysmal A-fib on Eliquis ,  , hospitalized a month ago (07/13/2024 - 08/03/2024) with intractable vomiting and inability to tolerate p.o. due to large obstructing pancreatic mass s/p biliary stent 07/16/2024,s/p J-tube placement on 07/22/2024, being admitted with concerns for G-tube dysfunction with cellulitis around G-tube site, dehydration/AKI/hypernatremia.  9/19- s/p Cholangiogram through biliary drain; common bile duct stent placement with covered metallic stent  9/25- s/p j-tube placement by surgery 9/26- TF held, x-ray confirmed j-tube in good position, TF resumed 9/27- TF held secondary to vomiting 9/29- contrast study revealed no evidence of obstruction from jejunostomy although no progression of PO contrast  Reviewed I/O's: +720 ml x 24 hours and +3.2 L since admission  Per MD notes from prior  admission, remains high risk for recurrence of abdominal wall/stomach bloating given nearly obstructive mass.   Briefly assessed MS Bloodgood, as she was being prepared for personal care at time of visit. She remains NPO and receives sole nutrition support via j-tube. Osmolite 1.5 infusing at 75 ml/hr. She is tolerating TF well.   Reviewed weight history; patient has experienced a 10.2% weight loss over the past 4 months, which is significant for time frame. Per nursing assessment, patient with moderate edema, which may be masking true weight loss as well as fat and muscle depletions.   Per TOC notes, patient is followed by [facility] for TF.   Medications reviewed and include dextrose  5% at 100 ml/hr.   Labs reviewed: Na: 151, CBGS: 123-210 (inpatient orders for glycemic control are 0-15 units insulin  aspart every 4 hours).    NUTRITION - FOCUSED PHYSICAL EXAM:  Flowsheet Row Most Recent Value  Orbital Region No depletion  Upper Arm Region No depletion  Thoracic and Lumbar Region No depletion  Buccal Region No depletion  Temple Region No depletion  Clavicle Bone Region No depletion  Clavicle and Acromion Bone Region No depletion  Scapular Bone Region No depletion  Dorsal Hand No depletion  Patellar Region No depletion  Anterior Thigh Region No depletion  Posterior Calf Region No depletion  Edema (RD Assessment) Moderate  Hair Reviewed  Eyes Reviewed  Mouth Reviewed  Skin Reviewed  Nails Reviewed    Diet Order:   Diet Order             Diet NPO time specified  Diet effective now  EDUCATION NEEDS:   No education needs have been identified at this time  Skin:  Skin Assessment: Skin Integrity Issues: Skin Integrity Issues:: Incisions Incisions: closed abdomen  Last BM:  08/22/24  Height:   Ht Readings from Last 1 Encounters:  08/20/24 5' (1.524 m)    Weight:   Wt Readings from Last 1 Encounters:  08/23/24 110.3 kg    Ideal Body Weight:   45.5 kg  BMI:  Body mass index is 47.49 kg/m.  Estimated Nutritional Needs:   Kcal:  1850-2050  Protein:  100-115 grams  Fluid:  1.8-2.0 L    Margery ORN, RD, LDN, CDCES Registered Dietitian III Certified Diabetes Care and Education Specialist If unable to reach this RD, please use RD Inpatient group chat on secure chat between hours of 8am-4 pm daily

## 2024-08-23 NOTE — Progress Notes (Signed)
 [facility] SURGICAL PROGRESS NOTE  Hospital Day(s): 1.   Interval History:  Patient seen and examined No acute events or new complaints overnight.  Patient known to our service following feeding jejunostomy tube on 09/25 secondary to advanced renal cell carcinoma She was seen earlier this admission secondary to concerns of clogged jejunostomy tube; flushed easily Asked to see again this AM for similar concerns RN was able to instill de-clogging protocol without issue; seen at bedside now and tube and adapter flush without resistance.  No fever, chills She continues to be hypernatremic to 151  Vital signs in last 24 hours: [min-max] current  Temp:  [97.6 F (36.4 C)-98 F (36.7 C)] 97.8 F (36.6 C) (10/27 0435) Pulse Rate:  [87-98] 89 (10/27 0435) Resp:  [16-18] 18 (10/27 0435) BP: (94-130)/(65-89) 122/65 (10/27 0435) SpO2:  [95 %-100 %] 100 % (10/27 0435) Weight:  [110.3 kg] 110.3 kg (10/27 0435)     Height: 5' (152.4 cm) Weight: 110.3 kg BMI (Calculated): 47.49   Intake/Output last 2 shifts:  10/26 0701 - 10/27 0700 In: 720 [NG/GT:720] Out: -    Physical Exam:  Constitutional: alert, cooperative and no distress  Respiratory: breathing non-labored at rest  Gastrointestinal: soft, non-tender, and non-distended. Jejunostomy tube in left abdomen; adapter removed and tube flushes without issue, adapter replaced and this also flushes without issue.   Labs:     Latest Ref Rng & Units 08/23/2024    4:10 AM 08/22/2024    7:27 AM 08/21/2024    5:13 AM  CBC  WBC 4.0 - 10.5 K/uL 11.5  12.0  11.3   Hemoglobin 12.0 - 15.0 g/dL 9.2  9.2  6.0   Hematocrit 36.0 - 46.0 % 29.7  29.1  20.5   Platelets 150 - 400 K/uL 236  252  232       Latest Ref Rng & Units 08/23/2024    4:10 AM 08/22/2024    7:27 AM 08/21/2024    6:15 AM  CMP  Glucose 70 - 99 mg/dL 862  767  812   BUN 8 - 23 mg/dL 44  54  56   Creatinine 0.44 - 1.00 mg/dL 8.32  7.94  7.93   Sodium 135 - 145  mmol/L 151  159  157   Potassium 3.5 - 5.1 mmol/L 3.9  3.2  3.4   Chloride 98 - 111 mmol/L 119  123  121   CO2 22 - 32 mmol/L 23  22  21    Calcium  8.9 - 10.3 mg/dL 7.9  8.1  8.1      Imaging studies: No new pertinent imaging studies   Assessment/Plan:  68 y.o. female with jejunostomy concerns s/p placement of feeding jejunostomy on 09/25 secondary to severe malnutrition   - Jejunostomy flushes well without resistance; Unclogging protocol used as well. Okay to continue this if needed - Okay to resume feeds via jejunostomy   - No need for surgical intervention   - Further management per primary service   - We will remain available as needed  All of the above findings and recommendations were discussed with the patient, and the medical team, and all of patient's questions were answered to her expressed satisfaction.  -- Arthea Platt, PA-C [facility] 08/23/2024, 11:47 AM M-F: 7am - 4pm

## 2024-08-23 NOTE — Progress Notes (Signed)
 Progress Note   Patient: Alyssa Leonard FMW:[id number] DOB: Sep 07, 1956 DOA: 08/20/2024     1 DOS: the patient was seen and examined on 08/23/2024   Brief hospital course: From HPI SHARONICA KRASZEWSKI is a 68 y.o. female with medical history hypertension, OSA, morbid obesity with history of Roux-en-Y, CKD stage IIIb renal cell carcinoma,  malignant neoplasm of the pancreas, paroxysmal A-fib on Eliquis ,  , hospitalized a month ago (07/13/2024 - 08/03/2024) with intractable vomiting and inability to tolerate p.o. due to large obstructing pancreatic mass s/p biliary stent 07/16/2024,s/p J-tube placement on 07/22/2024, being admitted with concerns for G-tube dysfunction with cellulitis around G-tube site, dehydration/AKI/hypernatremia.  Patient states she started having pain in the area of the J tube insertion about 3 days ago and then yesterday, the tube appeared to be blocked and started leaking around the insertion site. She has received no feeds in over 24 hours. She denies fever or chills.  She denies vomiting.     In the ED, afebrile tachycardic to 124, BP 128/63 saturating at 100% on room air Labs notable for WBC 12,000, hemoglobin 7.9 (8.4 couple weeks prior), CMP notable for sodium 161, BUN/creatinine 61/2.10 up from 33/1.65 a couple weeks prior LFTs improved from a couple weeks prior.  Lipase pending. EKG showing sinus tachycardia at 102 CT abdomen and pelvis showing grossly unchanged pancreatic head and uncinate process mass measuring up to 5.5 cm, unchanged right renal mass, common bile duct stent with pneumobilia unchanged.   Patient was treated with an LR bolus x 2, morphine  for pain, started on Zosyn and vancomycin    Admission requested.         Assessment and Plan: Cellulitis at gastrostomy tube site [facility]) Patient reports pain at tube insertion site Has mild leukocytosis Continue IV vancomycin  and ceftriaxone  Skin barrier precautions   Gastrostomy tube dysfunction (HCC) G-tube site  has been evaluated by general surgeon as well as IR.  Tube have been checked and currently functioning well.  Have been cleared for tube to be restarted General Surgery on board concerning tube feeding According to nursing staff tube feeding not working   Hypernatremia-improving Sodium level improving Continue D5W ordered Continue to monitor sodium level closely   Acute renal failure superimposed on stage 3b chronic kidney disease (HCC) BUN/creatinine 61/2.10 up from 33/1.65 a couple weeks Secondary to inadequate tube feeds with resulting dehydration Continue IV hydration Monitor renal function and avoid nephrotoxins   Carcinoma of head of pancreas (HCC) S/p metallic biliary stent on 07/16/2024 No acute issues, LFTs appears improved Patient is G-tube dependent due to obstructive mass   Paroxysmal atrial flutter (HCC) Continue Eliquis  Continue metoprolol    Hypertension Hydralazine  IV as needed while off tube feeds   Renal cell carcinoma (HCC) Mass stable on CT No acute issues suspected   Anemia of chronic disease Patient is status post 2 units of blood transfusion on 08/21/2025   Asthma, chronic Not acutely exacerbated Albuterol  as needed   Diabetes mellitus without complication (HCC) Sliding scale insulin  coverage   Obesity, Class III, BMI 40-49.9 (morbid obesity) (HCC) History of gastric bypass surgery Complicating factor to overall prognosis and care   DVT prophylaxis: SCD   Consults: IR   Advance Care Planning:   Code Status: Prior    Family Communication: none   Disposition Plan: Back to previous home environment     Subjective:  Seen and examined at bedside this morning She denies nausea vomiting did have some abdominal pain   Physical  Exam: Constitutional:      General: She is not in acute distress.    Appearance: She is morbidly obese.  HENT:     Head: Normocephalic and atraumatic.  Cardiovascular:     Rate and Rhythm: Normal rate and regular  rhythm.     Heart sounds: Normal heart sounds.  Pulmonary:     Effort: Pulmonary effort is normal.     Breath sounds: Normal breath sounds.  Abdominal:     Palpations: Abdomen is soft.     Tenderness: There is no abdominal tenderness.     Comments: Some drainage around the tube site noted Neurological:     Mental Status: Mental status is at baseline.     Data Reviewed:    Latest Ref Rng & Units 08/23/2024    4:10 AM 08/22/2024    7:27 AM 08/21/2024    5:13 AM  CBC  WBC 4.0 - 10.5 K/uL 11.5  12.0  11.3   Hemoglobin 12.0 - 15.0 g/dL 9.2  9.2  6.0   Hematocrit 36.0 - 46.0 % 29.7  29.1  20.5   Platelets 150 - 400 K/uL 236  252  232        Latest Ref Rng & Units 08/23/2024    4:10 AM 08/22/2024    7:27 AM 08/21/2024    6:15 AM  BMP  Glucose 70 - 99 mg/dL 862  767  812   BUN 8 - 23 mg/dL 44  54  56   Creatinine 0.44 - 1.00 mg/dL 8.32  7.94  7.93   Sodium 135 - 145 mmol/L 151  159  157   Potassium 3.5 - 5.1 mmol/L 3.9  3.2  3.4   Chloride 98 - 111 mmol/L 119  123  121   CO2 22 - 32 mmol/L 23  22  21    Calcium  8.9 - 10.3 mg/dL 7.9  8.1  8.1      Vitals:   08/22/24 1635 08/22/24 2045 08/23/24 0435 08/23/24 1527  BP: 94/83 130/89 122/65 128/76  Pulse: 87 98 89 85  Resp: 16 18 18 18   Temp: 97.6 F (36.4 C) 98 F (36.7 C) 97.8 F (36.6 C) 99 F (37.2 C)  TempSrc:  Oral Oral Oral  SpO2: 100% 95% 100% 100%  Weight:   110.3 kg   Height:         Author: Drue ONEIDA Potter, MD 08/23/2024 4:28 PM  For on call review [email or web address].

## 2024-08-23 NOTE — Plan of Care (Signed)

## 2024-08-23 NOTE — Evaluation (Signed)
 Physical Therapy Evaluation Patient Details Name: Alyssa Leonard MRN: [id number] DOB: October 12, 1956 Today's Date: 08/23/2024  History of Present Illness  Pt is a 68 y/o F presenting to ED with c/o pain and oozing at GJ-tube site with clogged feeds. MD assessment includes cellulitis at gastrostomy tube site, gastrostomy tube dysfunction, hypernatremia, acute renal failure superimposed on stage IIIb CKD. PMH significant for renal cell carcinoma, malignant neoplasm of the pancreas, CKD IIIb, paroxysmal afib on Eliquis .   Clinical Impression  Pt A&Ox4, pleasant and agreeable to PT evaluation. At most recent baseline, pt reports being modI with rollator for household ambulation, WC for community distances, requires assistance with ADLs from family members. Pt was met sitting in recliner, able to perform STS transfer with minA to achieve standing and control descent to sitting, VC for hand placement on armrests/RW. Pt amb short distance in room with bariatric RW and CGA, no LOB but was very fatigued with short bout of ambulation. Attempted to get HR/SpO2 reading after completing amb but was unable to obtain an accurate reading via pulse ox, pt's SOB improved with seated rest. Pt was left sitting in recliner at end of session with all needs in reach. Pt would benefit from skilled PT intervention to address listed deficits (see PT Problem List) and improve independence with mobility/ADLs.       If plan is discharge home, recommend the following: A little help with walking and/or transfers;A lot of help with bathing/dressing/bathroom;Assistance with cooking/housework;Assist for transportation   Can travel by private vehicle        Equipment Recommendations None recommended by PT (pt has recommended DME)  Recommendations for Other Services       Functional Status Assessment Patient has had a recent decline in their functional status and demonstrates the ability to make significant improvements in function  in a reasonable and predictable amount of time.     Precautions / Restrictions Precautions Precautions: Fall Recall of Precautions/Restrictions: Intact Precaution/Restrictions Comments: J-Tube Left lower abdomen Restrictions Weight Bearing Restrictions Per Provider Order: No      Mobility  Bed Mobility               General bed mobility comments: NT, pt seated in recliner pre/post session    Transfers Overall transfer level: Needs assistance Equipment used: Rolling walker (2 wheels) (Bariatric RW) Transfers: Sit to/from Stand Sit to Stand: Min assist           General transfer comment: STS from recliner with minA to achieve standing. VC for hand placement on armrests/RW during transfer. Effortful to complete transfers. Poor eccentric control to sitting    Ambulation/Gait Ambulation/Gait assistance: Contact guard assist Gait Distance (Feet): 20 Feet Assistive device: Rolling walker (2 wheels) (bariatric) Gait Pattern/deviations: Step-through pattern, Decreased stride length, Trunk flexed, Wide base of support Gait velocity: decreased     General Gait Details: Pt amb short distance in room with BRW, no LOB. Pt fatigues quickly with short bouts of mobility, increased SOB noted with activity. VC to maintain BOS within RW frame  Stairs            Wheelchair Mobility     Tilt Bed    Modified Rankin (Stroke Patients Only)       Balance Overall balance assessment: Needs assistance Sitting-balance support: Feet supported Sitting balance-Leahy Scale: Good Sitting balance - Comments: steady static sitting and reaching within BOS   Standing balance support: Bilateral upper extremity supported, Reliant on assistive device for balance Standing balance-Leahy Scale:  Fair Standing balance comment: heavy BUE support on BRW, fatigues quickly                             Pertinent Vitals/Pain Pain Assessment Pain Assessment: No/denies pain     Home Living Family/patient expects to be discharged to:: Private residence Living Arrangements: Spouse/significant other Available Help at Discharge: Family;Friend(s);Available 24 hours/day Type of Home: Apartment Home Access: Level entry       Home Layout: One level Home Equipment: Agricultural Consultant (2 wheels);Cane - single point;Wheelchair - Personal Assistant (4 wheels);BSC/3in1;Other (comment)      Prior Function Prior Level of Function : Needs assist             Mobility Comments: pt reports amb in home with bari RW ADLs Comments: recent assist for ADL/IADL     Extremity/Trunk Assessment   Upper Extremity Assessment Upper Extremity Assessment: Generalized weakness    Lower Extremity Assessment Lower Extremity Assessment: Defer to PT evaluation       Communication   Communication Communication: No apparent difficulties    Cognition Arousal: Alert Behavior During Therapy: WFL for tasks assessed/performed   PT - Cognitive impairments: No apparent impairments                       PT - Cognition Comments: A&Ox4, pleasant and cooperative Following commands: Intact       Cueing Cueing Techniques: Verbal cues, Visual cues     General Comments General comments (skin integrity, edema, etc.): J-tube and dressing in place for duration of session    Exercises Other Exercises Other Exercises: attempted to get HR/SpO2 reading after bout of amb, unable to get clear reading with pulse oximetry   Assessment/Plan    PT Assessment Patient needs continued PT services  PT Problem List Decreased strength;Decreased activity tolerance;Decreased balance;Decreased mobility;Cardiopulmonary status limiting activity       PT Treatment Interventions DME instruction;Gait training;Functional mobility training;Therapeutic activities;Therapeutic exercise;Balance training;Neuromuscular re-education;Patient/family education    PT Goals (Current  goals can be found in the Care Plan section)  Acute Rehab PT Goals Patient Stated Goal: to go home PT Goal Formulation: With patient Time For Goal Achievement: 09/06/24 Potential to Achieve Goals: Fair    Frequency Min 2X/week     Co-evaluation               AM-PAC PT 6 Clicks Mobility  Outcome Measure Help needed turning from your back to your side while in a flat bed without using bedrails?: A Little Help needed moving from lying on your back to sitting on the side of a flat bed without using bedrails?: A Little Help needed moving to and from a bed to a chair (including a wheelchair)?: A Little Help needed standing up from a chair using your arms (e.g., wheelchair or bedside chair)?: A Little Help needed to walk in hospital room?: A Little Help needed climbing 3-5 steps with a railing? : Total 6 Click Score: 16    End of Session Equipment Utilized During Treatment: Gait belt Activity Tolerance: Patient limited by fatigue Patient left: in chair;with call bell/phone within reach;with chair alarm set Nurse Communication: Mobility status PT Visit Diagnosis: Other abnormalities of gait and mobility (R26.89);Muscle weakness (generalized) (M62.81);Difficulty in walking, not elsewhere classified (R26.2)    Time: 8576-8554 PT Time Calculation (min) (ACUTE ONLY): 22 min   Charges:   PT Evaluation $PT Eval Moderate Complexity: 1 Mod  PT General Charges $$ ACUTE PT VISIT: 1 Visit         Bama Hanselman, SPT

## 2024-08-23 NOTE — Progress Notes (Deleted)
 TOC Brief Assessment:  Patient with a history of malignancy of the Pancrease admitted with g-tube cellulitis and dysfunction.

## 2024-08-23 NOTE — Progress Notes (Addendum)
 TOC Brief Assessment:  Patient with a history of pancreatic malignancy admitted with g-tube malfunction and cellulitis. Patient is active with [facility]. TOC confirmed [facility] will follow at discharge with new orders. Patient is also active with [facility] for Tube Feeds. High risk assessment completed. TOC will follow this patient for discharge planning.

## 2024-08-23 NOTE — Evaluation (Addendum)
 Occupational Therapy Evaluation Patient Details Name: Alyssa Leonard MRN: [id number] DOB: 29-Jul-1956 Today's Date: 08/23/2024   History of Present Illness   Pt is a 68 y/o F presenting to ED with c/o pain and oozing at GJ-tube site with clogged feeds. MD assessment includes cellulitis at gastrostomy tube site, gastrostomy tube dysfunction, hypernatremia, acute renal failure superimposed on stage IIIb CKD. PMH significant for renal cell carcinoma, malignant neoplasm of the pancreas, CKD IIIb, paroxysmal afib on Eliquis .     Clinical Impressions Chart reviewed to date, pt greeted semi supine in bed, oriented x4, agreeable to OT evaluation. PTA pt report recent assist from husband/daughter for ADLs, amb around her apartment with bari RW. She reports she transfers to her bsc, assist for peri care. Pt presents with deficits in strength, endurance, activity tolerance, balance, affecting safe and optimal ADL completion. J tube intact pre post session. Pt will benefit from acute OT to address functional deficits and to facilitate optimal ADL performance.      If plan is discharge home, recommend the following:   A lot of help with bathing/dressing/bathroom;A little help with walking and/or transfers;Assistance with cooking/housework     Functional Status Assessment   Patient has had a recent decline in their functional status and demonstrates the ability to make significant improvements in function in a reasonable and predictable amount of time.     Equipment Recommendations   None recommended by OT;Other (comment) (pt has recommended equipment)     Recommendations for Other Services         Precautions/Restrictions   Precautions Recall of Precautions/Restrictions: Intact Restrictions Weight Bearing Restrictions Per Provider Order: No     Mobility Bed Mobility Overal bed mobility: Needs Assistance Bed Mobility: Supine to Sit     Supine to sit: Mod assist           Transfers Overall transfer level: Needs assistance Equipment used:  (bari RW) Transfers: Sit to/from Stand Sit to Stand: Min assist                  Balance Overall balance assessment: Needs assistance Sitting-balance support: Feet supported Sitting balance-Leahy Scale: Good     Standing balance support: Bilateral upper extremity supported, Reliant on assistive device for balance Standing balance-Leahy Scale: Fair                             ADL either performed or assessed with clinical judgement   ADL Overall ADL's : Needs assistance/impaired     Grooming: Wash/dry face;Sitting;Minimal assistance       Lower Body Bathing: Maximal assistance;Sit to/from stand   Upper Body Dressing : Minimal assistance;Sitting Upper Body Dressing Details (indicate cue type and reason): donn/doff gown Lower Body Dressing: Maximal assistance       Toileting- Clothing Manipulation and Hygiene: Maximal assistance;Sit to/from stand incontinent BM in bed, then continent on bsc        Functional mobility during ADLs: Contact guard assist;Minimal assistance (approx 15' with bari RW)       Vision Patient Visual Report: No change from baseline       Perception         Praxis         Pertinent Vitals/Pain Pain Assessment Pain Assessment: No/denies pain     Extremity/Trunk Assessment Upper Extremity Assessment Upper Extremity Assessment: Generalized weakness   Lower Extremity Assessment Lower Extremity Assessment: Defer to PT evaluation  Communication Communication Communication: No apparent difficulties   Cognition Arousal: Alert Behavior During Therapy: WFL for tasks assessed/performed Cognition: No apparent impairments                               Following commands: Intact       Cueing  General Comments   Cueing Techniques: Verbal cues;Visual cues  J- tube intact pre/post session   Exercises Other Exercises Other  Exercises: edu re role of OT, role of rehab, discharge recommendations   Shoulder Instructions      Home Living Family/patient expects to be discharged to:: Private residence Living Arrangements: Spouse/significant other Available Help at Discharge: Family;Friend(s);Available 24 hours/day Type of Home: Apartment Home Access: Level entry     Home Layout: One level     Bathroom Shower/Tub: Chief Strategy Officer: Handicapped height     Home Equipment: Agricultural Consultant (2 wheels);Cane - single point;Wheelchair - Personal Assistant (4 wheels);BSC/3in1;Other (comment)          Prior Functioning/Environment Prior Level of Function : Needs assist             Mobility Comments: pt reports amb in home with bari RW ADLs Comments: recent assist for ADL/IADL    OT Problem List: Decreased strength;Decreased activity tolerance;Impaired balance (sitting and/or standing);Decreased knowledge of use of DME or AE   OT Treatment/Interventions: Self-care/ADL training;Therapeutic exercise;Patient/family education;Balance training;Energy conservation;DME and/or AE instruction;Therapeutic activities      OT Goals(Current goals can be found in the care plan section)   Acute Rehab OT Goals Patient Stated Goal: go home OT Goal Formulation: With patient Time For Goal Achievement: 09/06/24 Potential to Achieve Goals: Good ADL Goals Pt Will Perform Grooming: with supervision;sitting Pt Will Perform Lower Body Dressing: with supervision;sitting/lateral leans;sit to/from stand Pt Will Transfer to Toilet: with supervision;ambulating Pt Will Perform Toileting - Clothing Manipulation and hygiene: with supervision;sit to/from stand;sitting/lateral leans   OT Frequency:  Min 2X/week    Co-evaluation              AM-PAC OT 6 Clicks Daily Activity     Outcome Measure Help from another person eating meals?: None (anticipate, tube feeds) Help from another  person taking care of personal grooming?: None Help from another person toileting, which includes using toliet, bedpan, or urinal?: A Lot Help from another person bathing (including washing, rinsing, drying)?: A Lot Help from another person to put on and taking off regular upper body clothing?: A Little Help from another person to put on and taking off regular lower body clothing?: A Lot 6 Click Score: 17   End of Session Equipment Utilized During Treatment:  (bari RW) Nurse Communication: Mobility status  Activity Tolerance: Patient tolerated treatment well Patient left: in chair;with call bell/phone within reach;with chair alarm set  OT Visit Diagnosis: Unsteadiness on feet (R26.81);Muscle weakness (generalized) (M62.81);Other abnormalities of gait and mobility (R26.89)                Time: 8651-8585 OT Time Calculation (min): 26 min Charges:  OT General Charges $OT Visit: 1 Visit OT Evaluation $OT Eval Moderate Complexity: 1 Mod  Therisa Sheffield, OTD OTR/L  08/23/24, 3:47 PM

## 2024-08-24 ENCOUNTER — Other Ambulatory Visit: Payer: Self-pay

## 2024-08-24 DIAGNOSIS — K9423 Gastrostomy malfunction: Secondary | ICD-10-CM | POA: Diagnosis not present

## 2024-08-24 LAB — MAGNESIUM: Magnesium: 2.3 mg/dL (ref 1.7–2.4)

## 2024-08-24 LAB — PHOSPHORUS: Phosphorus: 2.7 mg/dL (ref 2.5–4.6)

## 2024-08-24 LAB — CBC WITH DIFFERENTIAL/PLATELET
Abs Immature Granulocytes: 0.35 K/uL — ABNORMAL HIGH (ref 0.00–0.07)
Basophils Absolute: 0 K/uL (ref 0.0–0.1)
Basophils Relative: 0 %
Eosinophils Absolute: 0 K/uL (ref 0.0–0.5)
Eosinophils Relative: 0 %
HCT: 28.4 % — ABNORMAL LOW (ref 36.0–46.0)
Hemoglobin: 9 g/dL — ABNORMAL LOW (ref 12.0–15.0)
Immature Granulocytes: 3 %
Lymphocytes Relative: 16 %
Lymphs Abs: 1.8 K/uL (ref 0.7–4.0)
MCH: 28.8 pg (ref 26.0–34.0)
MCHC: 31.7 g/dL (ref 30.0–36.0)
MCV: 90.7 fL (ref 80.0–100.0)
Monocytes Absolute: 0.9 K/uL (ref 0.1–1.0)
Monocytes Relative: 8 %
Neutro Abs: 8 K/uL — ABNORMAL HIGH (ref 1.7–7.7)
Neutrophils Relative %: 73 %
Platelets: 205 K/uL (ref 150–400)
RBC: 3.13 MIL/uL — ABNORMAL LOW (ref 3.87–5.11)
RDW: 18.4 % — ABNORMAL HIGH (ref 11.5–15.5)
WBC: 11.1 K/uL — ABNORMAL HIGH (ref 4.0–10.5)
nRBC: 0 % (ref 0.0–0.2)

## 2024-08-24 LAB — BASIC METABOLIC PANEL WITH GFR
Anion gap: 8 (ref 5–15)
BUN: 39 mg/dL — ABNORMAL HIGH (ref 8–23)
CO2: 23 mmol/L (ref 22–32)
Calcium: 7.7 mg/dL — ABNORMAL LOW (ref 8.9–10.3)
Chloride: 115 mmol/L — ABNORMAL HIGH (ref 98–111)
Creatinine, Ser: 1.54 mg/dL — ABNORMAL HIGH (ref 0.44–1.00)
GFR, Estimated: 37 mL/min — ABNORMAL LOW (ref >60)
Glucose, Bld: 245 mg/dL — ABNORMAL HIGH (ref 70–99)
Potassium: 4 mmol/L (ref 3.5–5.1)
Sodium: 146 mmol/L — ABNORMAL HIGH (ref 135–145)

## 2024-08-24 LAB — GLUCOSE, CAPILLARY
Glucose-Capillary: 202 mg/dL — ABNORMAL HIGH (ref 70–99)
Glucose-Capillary: 149 mg/dL — ABNORMAL HIGH (ref 70–99)
Glucose-Capillary: 125 mg/dL — ABNORMAL HIGH (ref 70–99)

## 2024-08-24 MED ORDER — FOLIC ACID 1 MG PO TABS
1.0000 mg | ORAL_TABLET | Freq: Every day | ORAL | 0 refills | Status: AC
  Filled 2024-08-24: qty 30, 30d supply, fill #0

## 2024-08-24 MED ORDER — VANCOMYCIN HCL 1500 MG/300ML IV SOLN
1500.0000 mg | INTRAVENOUS | Status: DC
Start: 2024-08-25 — End: 2024-08-24

## 2024-08-24 MED ORDER — BANATROL TF EN LIQD
60.0000 mL | Freq: Two times a day (BID) | ENTERAL | Status: DC
  Administered 2024-08-24: 60 mL

## 2024-08-24 MED ORDER — ADULT MULTIVITAMIN W/MINERALS CH
1.0000 | ORAL_TABLET | Freq: Every day | ORAL | 0 refills | Status: AC
  Filled 2024-08-24: qty 30, 30d supply, fill #0

## 2024-08-24 MED ORDER — BANATROL TF EN LIQD
60.0000 mL | Freq: Two times a day (BID) | ENTERAL | 0 refills | Status: AC
  Filled 2024-08-24: qty 60, 1d supply, fill #0

## 2024-08-24 NOTE — Progress Notes (Signed)
 Nutrition Follow-up  DOCUMENTATION CODES:   Morbid obesity  INTERVENTION:   -60 ml Banatrol BID -Continue MVI with minerals daily -Continue 1 mg folic acid  daily -Continue 2 mg copper  daily x 60 days  -Continue 1000 units vitamin A daily -Continue nocturnal feedings via j-tube:   Osmolite 1.5 at 75 ml/hr x 18 hours (1350 mL in 24 hours); Infuse from 1500 to 0900 every 24 hours. Continue Pro-Soure TF20 60 mL daily for now (may not be able to as outpatient) 120 ml free water  flush every 4 hours TF plus Pro-Source provides 2105 kcals, 105 g of protein and 1026 mL of free water . Total free water : 1746 ml daily   NUTRITION DIAGNOSIS:   Inadequate oral intake related to altered GI function as evidenced by NPO status.  Ongoing  GOAL:   Patient will meet greater than or equal to 90% of their needs  Met with TF  MONITOR:   TF tolerance  REASON FOR ASSESSMENT:   Consult Enteral/tube feeding initiation and management  ASSESSMENT:   68 y.o. female with medical history hypertension, OSA, morbid obesity with history of Roux-en-Y, CKD stage IIIb renal cell carcinoma,  malignant neoplasm of the pancreas, paroxysmal A-fib on Eliquis ,  , hospitalized a month ago (07/13/2024 - 08/03/2024) with intractable vomiting and inability to tolerate p.o. due to large obstructing pancreatic mass s/p biliary stent 07/16/2024,s/p J-tube placement on 07/22/2024, being admitted with concerns for G-tube dysfunction with cellulitis around G-tube site, dehydration/AKI/hypernatremia.  Reviewed I/O's: +2.2 L x 24 hours and +5.4 L since admission   Spoke with MD and RN; reports concern about diarrhea with TF. Noted patient is on antibiotics, which could potentially contribute to diarrhea. RD will add fiber supplements. Could also consider changing formula if fiber supplements is not effective.   Medications reviewed and include copper , folic acid , and imodium .   Labs reviewed: Na: 146, CBGS: 98-202 (inpatient  orders for glycemic control are none).    Diet Order:   Diet Order             Diet NPO time specified  Diet effective now                   EDUCATION NEEDS:   No education needs have been identified at this time  Skin:  Skin Assessment: Skin Integrity Issues: Skin Integrity Issues:: Incisions Incisions: closed abdomen  Last BM:  08/24/24 (type 7)  Height:   Ht Readings from Last 1 Encounters:  08/20/24 5' (1.524 m)    Weight:   Wt Readings from Last 1 Encounters:  08/24/24 116 kg    Ideal Body Weight:  45.5 kg  BMI:  Body mass index is 49.94 kg/m.  Estimated Nutritional Needs:   Kcal:  1850-2050  Protein:  100-115 grams  Fluid:  1.8-2.0 L    Margery ORN, RD, LDN, CDCES Registered Dietitian III Certified Diabetes Care and Education Specialist If unable to reach this RD, please use RD Inpatient group chat on secure chat between hours of 8am-4 pm daily

## 2024-08-24 NOTE — Consult Note (Signed)
 Pharmacy Antibiotic Note  Alyssa Leonard is a 68 y.o. female admitted on 08/20/2024 with cellulitis at G tube site.  Pharmacy has been consulted for vancomycin  dosing.  AKI on CKD, Scr 2.1-->1.54 (1.65 08/03/24)  Plan: Vancomycin  1500 mg IV Q 48 hrs. Goal AUC 400-550. Expected AUC: 512.2 Expected Cmin: 11.4 SCr used: 1.54, Vd used: 0.5  Continue ceftriaxone  1 gram IV every 24 hours per provider Follow renal function and cultures for adjustments  Height: 5' (152.4 cm) Weight: 116 kg (255 lb 11.7 oz) IBW/kg (Calculated) : 45.5  Temp (24hrs), Avg:98.1 F (36.7 C), Min:97.5 F (36.4 C), Max:99 F (37.2 C)  Recent Labs  Lab 08/20/24 1438 08/21/24 0513 08/21/24 0615 08/22/24 0727 08/23/24 0410 08/24/24 0412  WBC 12.8* 11.3*  --  12.0* 11.5* 11.1*  CREATININE 2.10*  --  2.06* 2.05* 1.67* 1.54*  VANCORANDOM  --   --   --  22 17  --     Estimated Creatinine Clearance: 40.7 mL/min (A) (by C-G formula based on SCr of 1.54 mg/dL (H)).    Allergies  Allergen Reactions   Latex Other (See Comments), Dermatitis, Hives and Swelling    unknown  Other reaction(s): Other (See Comments)  unknown  Other reaction(s): Other (See Comments) unknown    unknown   Aspirin Other (See Comments) and Nausea And Vomiting    Makes stomach burn  Other reaction(s): Other (See Comments)  Makes stomach burn  Makes stomach burn    Other reaction(s): Other (See Comments) Makes stomach burn   Plasticized Base [Plastibase]     Antimicrobials this admission: Ceftriaxone  10/25 >>  vancomycin  10/24 >>    Microbiology results: N/A  Thank you for allowing pharmacy to be a part of this patient's care.  Tobechukwu Emmick A Permelia Bamba, PharmD 08/24/2024 7:50 AM

## 2024-08-24 NOTE — Progress Notes (Signed)
 Mobility Specialist - Progress Note   Pre-mobility: HR, BP, SpO2 During mobility: HR, BP, SpO2 Post-mobility: HR, BP, SPO2     08/24/24 1200  Mobility  Activity Ambulated with assistance;Stood at bedside;Pivoted/transferred from bed to chair  Level of Assistance Contact guard assist, steadying assist  Assistive Device None  Range of Motion/Exercises Active  Activity Response Tolerated well  Mobility Referral Yes  Mobility visit 1 Mobility  Mobility Specialist Start Time (ACUTE ONLY) 1200   Pt resting in recliner upon entry on RA. Pt STS and ambulates to bed CGA with RW. Pt left in bed with needs in reach. Bed alarm activated.   Guido Rumble Mobility Specialist 08/24/24, 1:01 PM

## 2024-08-24 NOTE — Discharge Summary (Signed)
 Physician Discharge Summary   Patient: Alyssa Leonard MRN: [id number] DOB: 04-06-1956  Admit date:     08/20/2024  Discharge date: 08/24/24  Discharge Physician: Drue ONEIDA Potter   PCP: [name] LABOR, MD   Recommendations at discharge:  Follow-up with primary care physician  Discharge Diagnoses: Principal Problem:   Gastrostomy tube dysfunction (HCC) Active Problems:   Cellulitis at gastrostomy tube site [facility])   Acute renal failure superimposed on stage 3b chronic kidney disease (HCC)   Hypernatremia   Carcinoma of head of pancreas (HCC)   Renal cell carcinoma (HCC)   Hypertension   Paroxysmal atrial flutter (HCC)   Asthma, chronic   Anemia of chronic disease   Diabetes mellitus without complication (HCC)   Obesity, Class III, BMI 40-49.9 (morbid obesity) (HCC)   Malfunction of jejunostomy tube (HCC)  Resolved Problems:   * No resolved hospital problems. [facility] Course: From HPI Alyssa Leonard is a 68 y.o. female with medical history hypertension, OSA, morbid obesity with history of Roux-en-Y, CKD stage IIIb renal cell carcinoma,  malignant neoplasm of the pancreas, paroxysmal A-fib on Eliquis ,  , hospitalized a month ago (07/13/2024 - 08/03/2024) with intractable vomiting and inability to tolerate p.o. due to large obstructing pancreatic mass s/p biliary stent 07/16/2024,s/p J-tube placement on 07/22/2024, being admitted with concerns for G-tube dysfunction with cellulitis around G-tube site, dehydration/AKI/hypernatremia.  Patient states she started having pain in the area of the J tube insertion about 3 days ago and then yesterday, the tube appeared to be blocked and started leaking around the insertion site. She has received no feeds in over 24 hours. She denies fever or chills.  She denies vomiting.     In the ED, afebrile tachycardic to 124, BP 128/63 saturating at 100% on room air Labs notable for WBC 12,000, hemoglobin 7.9 (8.4 couple weeks prior), CMP notable for  sodium 161, BUN/creatinine 61/2.10 up from 33/1.65 a couple weeks prior LFTs improved from a couple weeks prior.  Lipase pending. EKG showing sinus tachycardia at 102 CT abdomen and pelvis showing grossly unchanged pancreatic head and uncinate process mass measuring up to 5.5 cm, unchanged right renal mass, common bile duct stent with pneumobilia unchanged.   Patient was treated with an LR bolus x 2, morphine  for pain, started on Zosyn and vancomycin    Admission requested.    Other hospital course as noted below   Assessment and Plan: Cellulitis at gastrostomy tube site [facility]) Patient reports pain at tube insertion site Has mild leukocytosis Patient received IV vancomycin  and ceftriaxone  have been transitioned to Augmentin    Gastrostomy tube dysfunction (HCC) G-tube site has been evaluated by general surgeon as well as IR.  Tube have been checked and currently functioning well.     Hypernatremia-improving Improved with D5W   Acute renal failure superimposed on stage 3b chronic kidney disease (HCC) Status post IV fluid   Carcinoma of head of pancreas (HCC) S/p metallic biliary stent on 07/16/2024 No acute issues, LFTs appears improved Patient is G-tube dependent due to obstructive mass   Paroxysmal atrial flutter (HCC) Continue Eliquis  Continue metoprolol    Hypertension Resume home medication as blood pressure allows   Renal cell carcinoma (HCC) Mass stable on CT No acute issues suspected   Anemia of chronic disease Patient is status post 2 units of blood transfusion on 08/21/2025   Asthma, chronic Not acutely exacerbated Albuterol  as needed   Diabetes mellitus without complication (HCC) Resume home medication   Obesity, Class  III, BMI 40-49.9 (morbid obesity) (HCC) History of gastric bypass surgery Complicating factor to overall prognosis and care   Consultants: Dietitian, general surgery Procedures performed: Tube feeding manipulation by IR and general  surgery Disposition: Home health Diet recommendation:  Tube feeding DISCHARGE MEDICATION: Allergies as of 08/24/2024       Reactions   Latex Other (See Comments), Dermatitis, Hives, Swelling   unknown Other reaction(s): Other (See Comments)  unknown Other reaction(s): Other (See Comments) unknown    unknown   Aspirin Other (See Comments), Nausea And Vomiting   Makes stomach burn Other reaction(s): Other (See Comments)  Makes stomach burn Makes stomach burn    Other reaction(s): Other (See Comments) Makes stomach burn   Plasticized Base [plastibase]         Medication List     STOP taking these medications    amiodarone  200 MG tablet Commonly known as: PACERONE    diazepam  2 MG tablet Commonly known as: VALIUM    omeprazole 40 MG capsule Commonly known as: PRILOSEC   potassium chloride  10 MEQ tablet Commonly known as: KLOR-CON    Testosterone 12.5 MG/ACT (1%) Gel       TAKE these medications    albuterol  (2.5 MG/3ML) 0.083% nebulizer solution Commonly known as: PROVENTIL  Take 2.5 mg by nebulization every 4 (four) hours as needed for wheezing or shortness of breath.   CertaVite/Antioxidants Tabs Place 1 tablet into feeding tube daily. Start taking on: August 25, 2024   dibucaine 1 % Oint Commonly known as: NUPERCAINAL Place 1 Application rectally as needed for hemorrhoids.   dorzolamide  2 % ophthalmic solution Commonly known as: TRUSOPT  Place 1 drop into both eyes 2 (two) times daily.   Eliquis  5 MG Tabs tablet Generic drug: apixaban  Take 1 tablet (5 mg total) by mouth 2 (two) times daily.   famotidine  20 MG tablet Commonly known as: PEPCID  Place 1 tablet (20 mg total) into feeding tube 3 times/day as needed-between meals & bedtime for heartburn or indigestion.   feeding supplement (OSMOLITE 1.5 CAL) Liqd 1,350 mLs by Per J Tube route daily.   fiber supplement (BANATROL TF) liquid Place 60 mLs into feeding tube 2 (two) times daily.   folic  acid 1 MG tablet Commonly known as: FOLVITE  Place 1 tablet (1 mg total) into feeding tube daily. Start taking on: August 25, 2024   metoprolol  tartrate 25 MG tablet Commonly known as: LOPRESSOR  Place 1 tablet (25 mg total) into feeding tube 2 (two) times daily.   OLANZapine  10 MG tablet Commonly known as: ZYPREXA  Place 1 tablet (10 mg total) into feeding tube at bedtime.   oxyCODONE  5 MG immediate release tablet Commonly known as: Oxy IR/ROXICODONE  Take 5 mg by mouth every 8 (eight) hours as needed for moderate pain (pain score 4-6) or severe pain (pain score 7-10).   sodium chloride  flush 0.9 % Soln injection Flush biliary cathter with 5-10mL saline daily to prevent clogging        Follow-up Information     Entzminger, Ethridge LABOR, MD. Go on 08/30/2024.   Specialty: Internal Medicine Why: @11 :50 am Contact information: [address] [place] [address] [phone]                Discharge Exam: Fredricka Weights   08/22/24 0500 08/23/24 0435 08/24/24 0500  Weight: 109.1 kg 110.3 kg 116 kg   Appearance: She is morbidly obese.  HENT:     Head: Normocephalic and atraumatic.  Cardiovascular:     Rate and Rhythm:  Normal rate and regular rhythm.     Heart sounds: Normal heart sounds.  Pulmonary:     Effort: Pulmonary effort is normal.     Breath sounds: Normal breath sounds.  Abdominal: Nontender, tube feeding in place with clean and dry dressing Neurological:     Mental Status: Mental status is at baseline.  Condition at discharge: good  The results of significant diagnostics from this hospitalization (including imaging, microbiology, ancillary and laboratory) are listed below for reference.   Imaging Studies: CT ABDOMEN PELVIS WO CONTRAST Result Date: 08/20/2024 EXAM: CT ABDOMEN AND PELVIS WITHOUT CONTRAST 08/20/2024 08:02:07 PM TECHNIQUE: CT of the abdomen and pelvis was performed without the administration of intravenous contrast. Multiplanar reformatted  images are provided for review. Automated exposure control, iterative reconstruction, and/or weight-based adjustment of the mA/kV was utilized to reduce the radiation dose to as low as reasonably achievable. COMPARISON: CT abdomen and pelvis dated 08/01/2024. CLINICAL HISTORY: LUQ pain around J tube site with purulent drainage. Patient has feeding tube on left side of abdomen. Reports it worked fine in the AM and then around lunch the tube became clogged. FINDINGS: LOWER CHEST: No acute abnormality. LIVER: The liver is unremarkable. GALLBLADDER AND BILE DUCTS: Gallbladder is surgically absent. Common bile duct stent is again seen with pneumobilia, unchanged. SPLEEN: No acute abnormality. PANCREAS: Pancreatic head and uncinate process mass measures up to 5.5 cm and appears grossly unchanged given lack of contrast. There may be trace inflammatory stranding involving the body and tail of the pancreas. ADRENAL GLANDS: No acute abnormality. KIDNEYS, URETERS AND BLADDER: Heterogeneous right renal mass measures 10.3 x 11.4 cm and appears grossly unchanged. Mild right-sided hydronephrosis is also unchanged likely secondary to mass effect from renal mass. Additional small cysts identified in the right kidney and left kidney appear unchanged from prior. No left-sided hydronephrosis. GI AND BOWEL: Her postsurgical changes in the stomach as seen on prior. Percutaneous jejunostomy tube in place, similar to prior. The appendix appears normal. PERITONEUM AND RETROPERITONEUM: No ascites. No free air. VASCULATURE: Aorta has atherosclerotic calcifications. LYMPH NODES: There are a few prominent central mesenteric lymph nodes similar to prior. REPRODUCTIVE ORGANS: Uterus is surgically absent. BONES AND SOFT TISSUES: No acute osseous abnormality. Left-sided body wall edema has mildly decreased. IMPRESSION: 1. Pancreatic head and uncinate process mass measuring up to 5.5 cm, grossly unchanged given lack of contrast, with possible trace  inflammatory stranding in the body and tail of the pancreas. 2. Unchanged Heterogeneous right renal mass measuring 10.3 x 11.4 cm with mild right-sided hydronephrosis likely secondary to mass effect from the renal mass. 3. Common bile duct stent with pneumobilia, unchanged. 4. Left-sided body wall edema, mildly decreased. Electronically signed by: Greig Pique MD 08/20/2024 08:12 PM EDT RP Workstation: [id number]   CT ABDOMEN PELVIS W CONTRAST Result Date: 08/01/2024 EXAM: CT ABDOMEN AND PELVIS WITH CONTRAST 08/01/2024 10:34:48 PM TECHNIQUE: CT of the abdomen and pelvis was performed with the administration of intravenous contrast, 80mL (iohexol  (OMNIPAQUE ) 300 MG/ML solution). Multiplanar reformatted images are provided for review. Automated exposure control, iterative reconstruction, and/or weight-based adjustment of the mA/kV was utilized to reduce the radiation dose to as low as reasonably achievable. COMPARISON: Comparison is made to June 08, 2024. CLINICAL HISTORY: Abdominal pain, acute, nonlocalized; Renal mass, pancreatic mass. Had J-tube laparoscopically placed. Now has new serous fluid draining from G-tube and surgical site. Ultrasound says no ascites. Eval for possible fistula versus abscess formation. FINDINGS: LOWER CHEST: Scattered ground-glass pulmonary infiltrates within the visualized left  lower lobe are nonspecific but may be infectious or inflammatory in the acute setting. LIVER: The liver is otherwise unremarkable. Status post cholecystectomy. GALLBLADDER AND BILE DUCTS: Interval placement of a palliative metallic biliary stent within the common bile duct extending through the ampulla into the second portion of the duodenum. Development of extensive pneumobilia in keeping with patency of the stented segment. PANCREAS: Grossly stable mass-like density within the head and uncinate process of the pancreas measuring roughly 3.3 x 2.5 x 5.9 cm at image 33, series 2, not optimally delineated on  this examination. The mass is acceptable from the third portion of the duodenum and direct invasion is difficult to exclude on this examination. No superimposed peripancreatic inflammatory changes. The pancreatic duct appears mildly dilated within the body and tail of the pancreas, similar to prior examination. KIDNEYS, URETERS AND BLADDER: The kidneys are normal in size and position. A 9.6 x 11.0 x 9.1 cm heterogeneously enhancing lobulated mass is again seen arising from the lower pole of the right kidney, most in keeping with a primary renal cell carcinoma. Mild right hydronephrosis is again identified secondary to mass effect upon the proximal ureter by the mass. Simple cortical cyst noted within the left kidney for which no follow-up imaging is recommended. Sclerosis on the left. No intrarenal or ureteral calculi. Bladder is unremarkable. GI AND BOWEL: Surgical changes of gastric sleeve resection are again identified. Percutaneous jejunostomy had been placed in the interval within the left mid abdomen. The stomach, small bowel, and large bowel are otherwise unremarkable. Appendix is normal. No free intraperitoneal gas or fluid. VASCULATURE: Mild aortoiliac atherosclerotic calcification. LYMPH NODES: No pathologic adenopathy within the abdomen and pelvis. REPRODUCTIVE ORGANS: Status post hysterectomy. No adnexal masses. BONES AND SOFT TISSUES: Extensive subcutaneous edema within the left lateral abdominal wall, nonspecific. This may represent past edema, postsurgical change, or local inflammatory process such as cellulitis. Osseous structures are age-appropriate. No acute bone abnormality. No lytic or blastic bone lesion. IMPRESSION: 1. Grossly stable mass-like density within the head and uncinate process of the pancreas measuring roughly 3.3 x 5.9 cm, not optimally delineated on this examination in keeping with a primary pancreatic malignancy . Direct invasion is difficult to exclude. No superimposed  peripancreatic inflammatory changes. 2. 9.6 x 11.0 x 9.1 cm heterogeneously enhancing lobulated mass arising from the lower pole of the right kidney, most in keeping with a primary renal cell carcinoma, with associated mild right hydronephrosis secondary to mass effect upon the proximal ureter. 3. Extensive subcutaneous edema within the left lateral abdominal wall, nonspecific, possibly representing past edema, postsurgical change, or local inflammatory process such as cellulitis. Correlation with clinical examination is recommended. 4. Interval palliative stenting of the extrahepatic bile duct with patency of the biliary tree through the second portion of the duodenum. 5. Interval percutaneous jejunostomy Electronically signed by: Dorethia Molt MD 08/01/2024 10:50 PM EDT RP Workstation: [id number]   US  Abdomen Limited Result Date: 08/01/2024 CLINICAL DATA:  Leakage around jejunostomy tube. EXAM: ULTRASOUND ABDOMEN LIMITED COMPARISON:  CT abdomen pelvis 06/08/2024 FINDINGS: No evidence of abnormal fluid collection by ultrasound around an indwelling percutaneous jejunostomy tube. Large mass of the right abdomen abutting the inferior liver is consistent with a known large right renal mass. No ascites visualized in the peritoneal cavity. IMPRESSION: 1. No evidence of abnormal fluid collection around an indwelling percutaneous jejunostomy tube. 2. Large right renal mass. 3. No ascites. Electronically Signed   By: Marcey Moan M.D.   On: 08/01/2024 11:26  DG Abd 1 View Result Date: 07/27/2024 CLINICAL DATA:  NG tube placement. EXAM: ABDOMEN - 1 VIEW COMPARISON:  Chest x-ray 07/27/2024 FINDINGS: Nasogastric tube is present with tip over the stomach in the left upper quadrant. Multiple surgical clips over the left mid to upper abdomen. Stent over the midline of the mid abdomen. Bowel gas pattern is nonobstructive. Remainder the exam is unchanged. IMPRESSION: 1. Nonobstructive bowel gas pattern. 2. Nasogastric  tube with tip over the stomach in the left upper quadrant. Electronically Signed   By: Toribio Agreste M.D.   On: 07/27/2024 17:31   DG ABD ACUTE 2+V W 1V CHEST Result Date: 07/27/2024 EXAM: UPRIGHT AND SUPINE XRAY VIEWS OF THE ABDOMEN AND 4 VIEW(S) OF THE CHEST 07/27/2024 08:00:00 AM COMPARISON: 07/23/2024 CLINICAL HISTORY: Ileus following gastrointestinal surgery FINDINGS: LUNGS AND PLEURA: Low lung volumes. No consolidation or pulmonary edema. No pleural effusion or pneumothorax. HEART AND MEDIASTINUM: No acute abnormality of the cardiac and mediastinal silhouettes. BOWEL: Nonobstructive bowel gas pattern. Contrast throughout the colon. PERITONEUM AND SOFT TISSUES: Biliary stent is present. Multiple surgical clips in the left upper quadrant. Vascular stent is present. No abnormal calcifications. No free air. BONES: No acute osseous abnormality. IMPRESSION: 1. No radiographic bowel obstruction or free air. 2. Low lung volumes. Electronically signed by: Donnice Mania MD 07/27/2024 11:09 AM EDT RP Workstation: [id number]   DG UGI W SINGLE CM (SOL OR THIN BA) Result Date: 07/26/2024 CLINICAL DATA:  68 year old female with history of RCC and pancreatic mass with CBD obstruction status post CBD stent placement and jejunostomy tube placement. Patient now with intolerance of jejunal tube feeds and emesis with p.o. intake. EXAM: DG UGI W SINGLE CM TECHNIQUE: Single contrast examination was then performed using thin liquid barium. This exam was performed by Carlin Griffon, PA-C, and was supervised and interpreted by Harrietta Sherry, MD. FLUOROSCOPY: Radiation Exposure Index (as provided by the fluoroscopic device): 122.60 mGy Kerma COMPARISON:  None Available. FINDINGS: Esophagus:  Patulous esophagus. Esophageal motility: Severe dysmotility and delayed emptying into the gastric lumen, requiring table tilt at 25 degrees for passage of contrast past the LES. Gastroesophageal reflux: Spontaneous and frank reflux noted in  prone position. Patient did vomit despite table tilt. Ingested 13mm barium tablet: Not given Stomach: Normal appearance. No hiatal hernia. Gastric emptying: Complete absence of contrast past gastric pylorus after 20 minutes following administration of 100 cc of PO contrast. Patient ultimately had an episode of emesis. Duodenum:  Not opacified.  Unable to assess. Other: 50 cc of contrast administered via the jejunostomy tube. Normal opacification of mid to distal small bowel loops. Patient endorsed uncomfortable pressure upon contrast administration. Study was severely limited by patient's body habitus and severe dizziness with attempts to rotate. Study performed with 25 degrees table tilt. IMPRESSION: 1. Normal opacification of small bowel distal to jejunostomy tube. Patient endorsed sensation of discomfort and pressure. 2. Severe esophageal dysmotility and gastric stasis with gastroesophageal reflux and subsequent emesis of administered oral contrast. Electronically Signed   By: Harrietta Sherry M.D.   On: 07/26/2024 16:29    Microbiology: Results for orders placed or performed during the hospital encounter of 06/29/24  Culture, blood (x 2)     Status: None   Collection Time: 06/29/24  4:39 PM   Specimen: BLOOD  Result Value Ref Range Status   Specimen Description BLOOD LEFT ANTECUBITAL  Final   Special Requests   Final    BOTTLES DRAWN AEROBIC AND ANAEROBIC Blood Culture adequate volume  Culture   Final    NO GROWTH 5 DAYS Performed at [facility], [address] [place], [place] [address]    Report Status 07/04/2024 FINAL  Final  Culture, blood (Routine X 2) w Reflex to ID Panel     Status: None   Collection Time: 06/29/24  7:02 PM   Specimen: BLOOD  Result Value Ref Range Status   Specimen Description BLOOD BLOOD LEFT HAND  Final   Special Requests   Final    BOTTLES DRAWN AEROBIC ONLY Blood Culture results may not be optimal due to an inadequate volume of blood received in  culture bottles   Culture   Final    NO GROWTH 5 DAYS Performed at [facility], [address] [place], [place] [address]    Report Status 07/04/2024 FINAL  Final    Labs: CBC: Recent Labs  Lab 08/20/24 1438 08/21/24 0513 08/22/24 0727 08/23/24 0410 08/24/24 0412  WBC 12.8* 11.3* 12.0* 11.5* 11.1*  NEUTROABS 8.1*  --  8.1* 7.6 8.0*  HGB 7.9* 6.0* 9.2* 9.2* 9.0*  HCT 26.7* 20.5* 29.1* 29.7* 28.4*  MCV 94.3 96.2 91.8 92.8 90.7  PLT 337 232 252 236 205   Basic Metabolic Panel: Recent Labs  Lab 08/20/24 1438 08/21/24 0615 08/22/24 0727 08/23/24 0410 08/24/24 0412  NA 161* 157* 159* 151* 146*  K 3.7 3.4* 3.2* 3.9 4.0  CL 123* 121* 123* 119* 115*  CO2 24 21* 22 23 23   GLUCOSE 108* 187* 232* 137* 245*  BUN 61* 56* 54* 44* 39*  CREATININE 2.10* 2.06* 2.05* 1.67* 1.54*  CALCIUM  8.8* 8.1* 8.1* 7.9* 7.7*  MG  --  2.4 2.4 2.5* 2.3  PHOS  --  4.8* 3.8 2.6 2.7   Liver Function Tests: Recent Labs  Lab 08/20/24 1438  AST 23  ALT 12  ALKPHOS 120  BILITOT 0.8  PROT 6.5  ALBUMIN 1.7*   CBG: Recent Labs  Lab 08/23/24 2019 08/23/24 2347 08/24/24 0432 08/24/24 0825 08/24/24 1135  GLUCAP 135* 98 202* 149* 125*    Discharge time spent:  35 minutes.  Signed: Drue ONEIDA Potter, MD [facility] 08/24/2024

## 2024-08-24 NOTE — TOC Transition Note (Signed)
 Transition of Care [facility]) - Discharge Note   Patient Details  Name: Alyssa Leonard MRN: [id number] Date of Birth: [birth year]  Transition [facility]) CM/SW Contact:  Dalia GORMAN Fuse, RN Phone Number: 08/24/2024, 1:53 PM   Clinical Narrative:     Patient is medically clear to discharge to home with home health. Patient is active with [facility], orders sent in hub to resume [facility] PT/OT.  Final next level of care: Home w Home Health Services     Patient Goals and CMS Choice            Discharge Placement                       Discharge Plan and Services Additional resources added to the After Visit Summary for                            [facility] Arranged: PT, OT HH Agency: [facility] Date [facility] Agency Contacted: 08/24/24 Time HH Agency Contacted: 1353 Representative spoke with at [facility] Agency: sent in hub  Social Drivers of Health (SDOH) Interventions SDOH Screenings   Food Insecurity: Patient Declined (08/21/2024)  Housing: Patient Declined (08/21/2024)  Recent Concern: Housing - High Risk (06/29/2024)  Transportation Needs: Patient Declined (08/21/2024)  Utilities: Patient Declined (08/21/2024)  Depression (PHQ2-9): Low Risk  (07/13/2024)  Financial Resource Strain: Low Risk  (06/01/2024)   Received from [facility]  Social Connections: Patient Declined (08/21/2024)  Tobacco Use: Medium Risk (08/20/2024)  Health Literacy: Medium Risk (09/03/2023)   Received from [facility]     Readmission Risk Interventions    08/23/2024    8:46 AM  Readmission Risk Prevention Plan  Transportation Screening Complete  Medication Review (RN Care Manager) Complete  PCP or Specialist appointment within 3-5 days of discharge Complete  HRI or Home Care Consult Complete  SW Recovery Care/Counseling Consult Complete

## 2024-08-24 NOTE — Plan of Care (Signed)
  Problem: Metabolic: Goal: Ability to maintain appropriate glucose levels will improve Outcome: Progressing   Problem: Nutritional: Goal: Maintenance of adequate nutrition will improve Outcome: Progressing   Problem: Tissue Perfusion: Goal: Adequacy of tissue perfusion will improve Outcome: Progressing   Problem: Clinical Measurements: Goal: Will remain free from infection Outcome: Progressing   Problem: Safety: Goal: Ability to remain free from injury will improve Outcome: Progressing

## 2024-08-26 ENCOUNTER — Other Ambulatory Visit: Payer: Self-pay

## 2024-09-06 ENCOUNTER — Other Ambulatory Visit: Payer: Self-pay

## 2024-09-16 ENCOUNTER — Telehealth: Payer: Self-pay | Admitting: Oncology

## 2024-09-16 ENCOUNTER — Inpatient Hospital Stay: Attending: Oncology | Admitting: Oncology

## 2024-09-16 ENCOUNTER — Inpatient Hospital Stay

## 2024-09-16 ENCOUNTER — Telehealth: Payer: Self-pay

## 2024-09-16 ENCOUNTER — Other Ambulatory Visit: Payer: Self-pay

## 2024-09-16 ENCOUNTER — Encounter: Payer: Self-pay | Admitting: Oncology

## 2024-09-16 ENCOUNTER — Ambulatory Visit: Payer: Self-pay | Admitting: Oncology

## 2024-09-16 VITALS — BP 124/92 | HR 117 | Temp 96.6°F | Resp 18 | Ht 60.0 in | Wt 230.1 lb

## 2024-09-16 DIAGNOSIS — K8689 Other specified diseases of pancreas: Secondary | ICD-10-CM | POA: Diagnosis not present

## 2024-09-16 DIAGNOSIS — I480 Paroxysmal atrial fibrillation: Secondary | ICD-10-CM | POA: Insufficient documentation

## 2024-09-16 DIAGNOSIS — Z7901 Long term (current) use of anticoagulants: Secondary | ICD-10-CM | POA: Insufficient documentation

## 2024-09-16 DIAGNOSIS — E870 Hyperosmolality and hypernatremia: Secondary | ICD-10-CM | POA: Diagnosis not present

## 2024-09-16 DIAGNOSIS — E1122 Type 2 diabetes mellitus with diabetic chronic kidney disease: Secondary | ICD-10-CM | POA: Insufficient documentation

## 2024-09-16 DIAGNOSIS — C641 Malignant neoplasm of right kidney, except renal pelvis: Secondary | ICD-10-CM

## 2024-09-16 DIAGNOSIS — I129 Hypertensive chronic kidney disease with stage 1 through stage 4 chronic kidney disease, or unspecified chronic kidney disease: Secondary | ICD-10-CM | POA: Insufficient documentation

## 2024-09-16 DIAGNOSIS — Z87891 Personal history of nicotine dependence: Secondary | ICD-10-CM | POA: Insufficient documentation

## 2024-09-16 DIAGNOSIS — Z79899 Other long term (current) drug therapy: Secondary | ICD-10-CM | POA: Insufficient documentation

## 2024-09-16 DIAGNOSIS — K869 Disease of pancreas, unspecified: Secondary | ICD-10-CM | POA: Insufficient documentation

## 2024-09-16 DIAGNOSIS — E669 Obesity, unspecified: Secondary | ICD-10-CM | POA: Insufficient documentation

## 2024-09-16 DIAGNOSIS — E785 Hyperlipidemia, unspecified: Secondary | ICD-10-CM | POA: Insufficient documentation

## 2024-09-16 LAB — CBC WITH DIFFERENTIAL (CANCER CENTER ONLY)
Abs Immature Granulocytes: 0.1 K/uL — ABNORMAL HIGH (ref 0.00–0.07)
Basophils Absolute: 0 K/uL (ref 0.0–0.1)
Basophils Relative: 0 %
Eosinophils Absolute: 0.2 K/uL (ref 0.0–0.5)
Eosinophils Relative: 2 %
HCT: 28.2 % — ABNORMAL LOW (ref 36.0–46.0)
Hemoglobin: 8.6 g/dL — ABNORMAL LOW (ref 12.0–15.0)
Immature Granulocytes: 1 %
Lymphocytes Relative: 16 %
Lymphs Abs: 1.6 K/uL (ref 0.7–4.0)
MCH: 29.6 pg (ref 26.0–34.0)
MCHC: 30.5 g/dL (ref 30.0–36.0)
MCV: 96.9 fL (ref 80.0–100.0)
Monocytes Absolute: 0.8 K/uL (ref 0.1–1.0)
Monocytes Relative: 9 %
Neutro Abs: 6.9 K/uL (ref 1.7–7.7)
Neutrophils Relative %: 72 %
Platelet Count: 166 K/uL (ref 150–400)
RBC: 2.91 MIL/uL — ABNORMAL LOW (ref 3.87–5.11)
RDW: 17.9 % — ABNORMAL HIGH (ref 11.5–15.5)
WBC Count: 9.6 K/uL (ref 4.0–10.5)
nRBC: 0 % (ref 0.0–0.2)

## 2024-09-16 LAB — CMP (CANCER CENTER ONLY)
ALT: 15 U/L (ref 0–44)
AST: 20 U/L (ref 15–41)
Albumin: 2.2 g/dL — ABNORMAL LOW (ref 3.5–5.0)
Alkaline Phosphatase: 125 U/L (ref 38–126)
Anion gap: 8 (ref 5–15)
BUN: 42 mg/dL — ABNORMAL HIGH (ref 8–23)
CO2: 26 mmol/L (ref 22–32)
Calcium: 8.9 mg/dL (ref 8.9–10.3)
Chloride: 123 mmol/L — ABNORMAL HIGH (ref 98–111)
Creatinine: 1.91 mg/dL — ABNORMAL HIGH (ref 0.44–1.00)
GFR, Estimated: 28 mL/min — ABNORMAL LOW (ref >60)
Glucose, Bld: 175 mg/dL — ABNORMAL HIGH (ref 70–99)
Potassium: 3.8 mmol/L (ref 3.5–5.1)
Sodium: 157 mmol/L — ABNORMAL HIGH (ref 135–145)
Total Bilirubin: 0.6 mg/dL (ref 0.0–1.2)
Total Protein: 6.7 g/dL (ref 6.5–8.1)

## 2024-09-16 NOTE — Telephone Encounter (Signed)
 Nutrition  Requested by Dr Melanee to review tube feeding regimen and water  flush as sodium elevated on lab draw.  Patient has already left clinic.   Called patient and ask that she return my call today.  Callback number provided.  Danae Oland B. Dasie SOLON, CSO, LDN Registered Dietitian [phone]

## 2024-09-16 NOTE — Telephone Encounter (Signed)
 Called pt to sched CT - spoke w/pt and pt spouse on speakerphone per pt request - confirmed date/time/location of CT - pt requested appt reminder via mail - LH

## 2024-09-16 NOTE — Progress Notes (Signed)
 Nutrition Assessment   Reason for Assessment:   Add on to schedule today from Dr Melanee to increase free water  flush via J-tube due to hypernatremia   ASSESSMENT:  68 year old female with HTN, OSA, morbid obesity, Roux-en-Y, CKD stage III, renal cell carcinoma, pancreatic mass, afib, hospitalized (07/13/24-08/03/24) with intractable vomiting and inability to tolerate po due to obstructing pancreatic mass s/p bilary stent on 07/16/24, J-tube placement on 07/22/24.  Recent admission for dehydration, AKI, hypernatremia.    Spoke with husband via phone regarding tube feeding regimen.  Husband has been starting continuous feeding at 80 ml/hr (nutren 1.5) at 7:30 pm and stopping feeding at 11:30 am. Patient is able to use 5.5 carton of during this 16 hours.  Husband gives water  flush at 7:30pm, midnight, 5am and 11:30am.  Husband reports that patient is having bowel movement about 3 times a day, not diarrhea.  Denies nausea.  Says that he is pushing water  in via syringe and feels like patient maybe in pain.  Recent admission noted tube in good position.  Patient not currently taking anything by mouth.   Medications: zyprexa , pepcid , MVI, folic acid    Labs: Na 157, glucose 175, BUN 42, creatinine 1.91   Anthropometrics:   Height: 60 inches Weight: 230 lb 1.6 oz today 278 lb 04/20/24 BMI: 44  17% weight loss in the last 5 months   Estimated Energy Needs  Kcals: 2288-2600 Protein: 104-124 g Fluid: > 2 liters   NUTRITION DIAGNOSIS: Inadequate oral intake related to altered GI function/cancer/obstruction as evidenced by J-tube placement   INTERVENTION:  Recommend continuing nutren 1.5 at 80ml/hr (5.5 cartons/day) 7:30pm-11:30 am via J-tube.  Recommend free water  flush of 6 times a day (7:30 pm, midnight, 5am, 11:30am, 2pm, 5pm).  Husband able to repeat back instructions to RD regarding water  flush.  Reviewed with husband to take plunger out of syringe and let gravity move water   through tube. Reviewed only using room temperature water  or luke warm water  in feeding tube.   Feeding provides 2062 calories, 93.5 g protein, 1050 ml free water  from formula plus 1080 ml flush (2130 ml free water  total/day).   Informed husband that patient will be scheduled for repeat lab work on 11/24.  Husband has RD contact number   MONITORING, EVALUATION, GOAL: weight trends, tube feeding   Next Visit: phone call on Tuesday, Nov 25  Raechal Raben B. Dasie SOLON, CSO, LDN Registered Dietitian [phone]

## 2024-09-16 NOTE — Telephone Encounter (Signed)
 Nutrition  Called daughter Verdie and left message.    Called daughter Russell and was at work.  She will text Catrina and have her call RD  Spoke with Catrina and she is not with patient at this time.  Patient is with spouse and Verdie ask that I call spouse's number.  RD tried to call this number and no answer or option to leave voicemail.

## 2024-09-16 NOTE — Progress Notes (Signed)
 Referral sent to [facility] for EUS, evaluation of pancreas mass.

## 2024-09-16 NOTE — Progress Notes (Signed)
 Patient would like to know if she can drink water  & if she will get her feeding tube out.

## 2024-09-17 LAB — CANCER ANTIGEN 19-9: CA 19-9: 4933 U/mL — ABNORMAL HIGH (ref 0–35)

## 2024-09-18 ENCOUNTER — Emergency Department: Admission: EM | Admit: 2024-09-18 | Discharge: 2024-09-18 | Disposition: A | Discharge status: Home / Self Care

## 2024-09-18 ENCOUNTER — Other Ambulatory Visit: Payer: Self-pay

## 2024-09-18 ENCOUNTER — Emergency Department

## 2024-09-18 DIAGNOSIS — K9423 Gastrostomy malfunction: Secondary | ICD-10-CM | POA: Diagnosis present

## 2024-09-18 DIAGNOSIS — I129 Hypertensive chronic kidney disease with stage 1 through stage 4 chronic kidney disease, or unspecified chronic kidney disease: Secondary | ICD-10-CM | POA: Diagnosis not present

## 2024-09-18 DIAGNOSIS — I480 Paroxysmal atrial fibrillation: Secondary | ICD-10-CM | POA: Diagnosis not present

## 2024-09-18 DIAGNOSIS — Z85528 Personal history of other malignant neoplasm of kidney: Secondary | ICD-10-CM | POA: Insufficient documentation

## 2024-09-18 DIAGNOSIS — Z8507 Personal history of malignant neoplasm of pancreas: Secondary | ICD-10-CM | POA: Diagnosis not present

## 2024-09-18 DIAGNOSIS — N1832 Chronic kidney disease, stage 3b: Secondary | ICD-10-CM | POA: Insufficient documentation

## 2024-09-18 DIAGNOSIS — E870 Hyperosmolality and hypernatremia: Secondary | ICD-10-CM | POA: Insufficient documentation

## 2024-09-18 DIAGNOSIS — Z789 Other specified health status: Secondary | ICD-10-CM

## 2024-09-18 DIAGNOSIS — E1122 Type 2 diabetes mellitus with diabetic chronic kidney disease: Secondary | ICD-10-CM | POA: Insufficient documentation

## 2024-09-18 DIAGNOSIS — K9419 Other complications of enterostomy: Secondary | ICD-10-CM | POA: Diagnosis not present

## 2024-09-18 DIAGNOSIS — Z7901 Long term (current) use of anticoagulants: Secondary | ICD-10-CM | POA: Insufficient documentation

## 2024-09-18 DIAGNOSIS — J4489 Other specified chronic obstructive pulmonary disease: Secondary | ICD-10-CM | POA: Diagnosis not present

## 2024-09-18 DIAGNOSIS — T85528A Displacement of other gastrointestinal prosthetic devices, implants and grafts, initial encounter: Secondary | ICD-10-CM

## 2024-09-18 LAB — COMPREHENSIVE METABOLIC PANEL WITH GFR
ALT: 10 U/L (ref 0–44)
AST: 19 U/L (ref 15–41)
Albumin: 2.7 g/dL — ABNORMAL LOW (ref 3.5–5.0)
Alkaline Phosphatase: 146 U/L — ABNORMAL HIGH (ref 38–126)
Anion gap: 9 (ref 5–15)
BUN: 33 mg/dL — ABNORMAL HIGH (ref 8–23)
CO2: 24 mmol/L (ref 22–32)
Calcium: 9 mg/dL (ref 8.9–10.3)
Chloride: 119 mmol/L — ABNORMAL HIGH (ref 98–111)
Creatinine, Ser: 1.61 mg/dL — ABNORMAL HIGH (ref 0.44–1.00)
GFR, Estimated: 34 mL/min — ABNORMAL LOW (ref >60)
Glucose, Bld: 117 mg/dL — ABNORMAL HIGH (ref 70–99)
Potassium: 3.9 mmol/L (ref 3.5–5.1)
Sodium: 152 mmol/L — ABNORMAL HIGH (ref 135–145)
Total Bilirubin: 0.4 mg/dL (ref 0.0–1.2)
Total Protein: 6.6 g/dL (ref 6.5–8.1)

## 2024-09-18 LAB — CBC WITH DIFFERENTIAL/PLATELET
Abs Immature Granulocytes: 0.12 K/uL — ABNORMAL HIGH (ref 0.00–0.07)
Basophils Absolute: 0 K/uL (ref 0.0–0.1)
Basophils Relative: 0 %
Eosinophils Absolute: 0.2 K/uL (ref 0.0–0.5)
Eosinophils Relative: 2 %
HCT: 27.5 % — ABNORMAL LOW (ref 36.0–46.0)
Hemoglobin: 8.3 g/dL — ABNORMAL LOW (ref 12.0–15.0)
Immature Granulocytes: 1 %
Lymphocytes Relative: 22 %
Lymphs Abs: 2.6 K/uL (ref 0.7–4.0)
MCH: 29.3 pg (ref 26.0–34.0)
MCHC: 30.2 g/dL (ref 30.0–36.0)
MCV: 97.2 fL (ref 80.0–100.0)
Monocytes Absolute: 0.8 K/uL (ref 0.1–1.0)
Monocytes Relative: 7 %
Neutro Abs: 7.8 K/uL — ABNORMAL HIGH (ref 1.7–7.7)
Neutrophils Relative %: 68 %
Platelets: 190 K/uL (ref 150–400)
RBC: 2.83 MIL/uL — ABNORMAL LOW (ref 3.87–5.11)
RDW: 16.8 % — ABNORMAL HIGH (ref 11.5–15.5)
WBC: 11.5 K/uL — ABNORMAL HIGH (ref 4.0–10.5)
nRBC: 0.3 % — ABNORMAL HIGH (ref 0.0–0.2)

## 2024-09-18 LAB — LACTIC ACID, PLASMA: Lactic Acid, Venous: 1.9 mmol/L (ref 0.5–1.9)

## 2024-09-18 NOTE — Consult Note (Signed)
 Subjective:   CC: Dislodged jejunostomy tube  HPI:  Alyssa Leonard is a 68 y.o. female who is consulted by Nicholaus for evaluation of above cc.  Last use of J-tube was 4 hours prior to initial examination.  Patient cannot recall exactly when it fell out between now and then.  She otherwise is comfortable and has no complaints  Past Medical History:  has a past medical history of Asthma, Atrial flutter (HCC), COPD (chronic obstructive pulmonary disease) (HCC), Diabetes mellitus without complication (HCC), GERD (gastroesophageal reflux disease), History of colon polyps, Hypertension, Meniere disease, OSA (obstructive sleep apnea), Renal cell carcinoma (HCC), and Sleep apnea.  Past Surgical History:  has a past surgical history that includes Cholecystectomy; Abdominal hysterectomy; Colonoscopy with propofol  (N/A, 12/11/2016); Bariatric Surgery; Colonoscopy with propofol  (N/A, 11/27/2021); IR INT EXT BILIARY DRAIN WITH CHOLANGIOGRAM (06/09/2024); IR Radiologist Eval & Mgmt (06/15/2024); IR EXCHANGE BILIARY DRAIN (06/22/2024); IR CONVERT BILIARY DRAIN TO INT EXT BILIARY DRAIN (06/29/2024); IR BILIARY STENT(S) EXIST ACCESS INC DILAT CATH EXCH/REMOVAL (07/16/2024); and Insertion, jejunostomy tube, robot-assisted (N/A, 07/22/2024).  Family History: family history includes Heart disease in her brother and brother; Hypertension in her mother; Kidney disease in her brother and sister.  Social History:  reports that she quit smoking about 30 years ago. Her smoking use included cigarettes. She has never used smokeless tobacco. She reports that she does not drink alcohol and does not use drugs.  Current Medications:  Prior to Admission medications   Medication Sig Start Date End Date Taking? Authorizing Provider  albuterol  (PROVENTIL ) (2.5 MG/3ML) 0.083% nebulizer solution Take 2.5 mg by nebulization every 4 (four) hours as needed for wheezing or shortness of breath. 04/05/24   [provider]  apixaban  (ELIQUIS ) 5  MG TABS tablet Take 1 tablet (5 mg total) by mouth 2 (two) times daily. 07/01/24   Patel, Sona, MD  dibucaine (NUPERCAINAL) 1 % OINT Place 1 Application rectally as needed for hemorrhoids.    [provider]  dorzolamide  (TRUSOPT ) 2 % ophthalmic solution Place 1 drop into both eyes 2 (two) times daily.    [provider]  famotidine  (PEPCID ) 20 MG tablet Place 1 tablet (20 mg total) into feeding tube 3 times/day as needed-between meals & bedtime for heartburn or indigestion. 08/03/24 09/16/24  Dezii, Alexandra, DO  fiber supplement, BANATROL TF, liquid Place 60 mLs into feeding tube 2 (two) times daily. 08/24/24   Dorinda Drue DASEN, MD  folic acid  (FOLVITE ) 1 MG tablet Place 1 tablet (1 mg total) into feeding tube daily. 08/25/24   Dorinda Drue DASEN, MD  metoprolol  tartrate (LOPRESSOR ) 25 MG tablet Place 1 tablet (25 mg total) into feeding tube 2 (two) times daily. 08/03/24 10/02/24  Dezii, Alexandra, DO  Multiple Vitamin (MULTIVITAMIN WITH MINERALS) TABS tablet Place 1 tablet into feeding tube daily. Patient not taking: Reported on 09/16/2024 08/25/24   Dorinda Drue DASEN, MD  Multiple Vitamins-Minerals (CERTAVITE/ANTIOXIDANTS) TABS 1 tablet into feeding tube Orally daily Patient not taking: Reported on 09/16/2024    [provider]  Nutritional Supplements (FEEDING SUPPLEMENT, OSMOLITE 1.5 CAL,) LIQD 1,350 mLs by Per J Tube route daily. 08/04/24   Dezii, Alexandra, DO  OLANZapine  (ZYPREXA ) 10 MG tablet Place 1 tablet (10 mg total) into feeding tube at bedtime. 08/03/24 09/16/24  Dezii, Alexandra, DO  oxyCODONE  (OXY IR/ROXICODONE ) 5 MG immediate release tablet Take 5 mg by mouth every 8 (eight) hours as needed for moderate pain (pain score 4-6) or severe pain (pain score 7-10).  [provider]  sodium chloride  flush 0.9 % SOLN injection Flush biliary cathter with 5-10mL saline daily to prevent clogging 06/11/24   Wouk, Devaughn Sayres, MD    Allergies:  Allergies as of 09/18/2024  - Review Complete 09/18/2024  Allergen Reaction Noted   Latex Other (See Comments), Dermatitis, Hives, and Swelling 01/05/2018   Aspirin Other (See Comments) and Nausea And Vomiting 08/07/2016   Plasticized base [plastibase]  06/09/2024    ROS:  Pertinent positives and negatives noted in HPI   Objective:     BP 124/73 (BP Location: Right Arm)   Pulse (!) 112   Temp 98.1 F (36.7 C) (Oral)   Resp (!) 22   SpO2 99%    Constitutional :  alert, cooperative, appears stated age, and no distress  Respiratory:  Clear to auscultation bilaterally  Cardiovascular:  Regular rate and rhythm  Gastrointestinal: Soft, nontender.  Jejunostomy tube opening with some enteric discharge, no obvious erythema induration to indicate infection.   Skin: Cool and moist  Psychiatric: Normal affect, non-agitated, not confused       LABS:     Latest Ref Rng & Units 09/18/2024    9:06 PM 09/16/2024   10:26 AM 08/24/2024    4:12 AM  CMP  Glucose 70 - 99 mg/dL 882  824  754   BUN 8 - 23 mg/dL 33  42  39   Creatinine 0.44 - 1.00 mg/dL 8.38  8.08  8.45   Sodium 135 - 145 mmol/L 152  157  146   Potassium 3.5 - 5.1 mmol/L 3.9  3.8  4.0   Chloride 98 - 111 mmol/L 119  123  115   CO2 22 - 32 mmol/L 24  26  23    Calcium  8.9 - 10.3 mg/dL 9.0  8.9  7.7   Total Protein 6.5 - 8.1 g/dL 6.6  6.7    Total Bilirubin 0.0 - 1.2 mg/dL 0.4  0.6    Alkaline Phos 38 - 126 U/L 146  125    AST 15 - 41 U/L 19  20    ALT 0 - 44 U/L 10  15        Latest Ref Rng & Units 09/18/2024    9:06 PM 09/16/2024   10:26 AM 08/24/2024    4:12 AM  CBC  WBC 4.0 - 10.5 K/uL 11.5  9.6  11.1   Hemoglobin 12.0 - 15.0 g/dL 8.3  8.6  9.0   Hematocrit 36.0 - 46.0 % 27.5  28.2  28.4   Platelets 150 - 400 K/uL 190  166  205      RADS: EXAM: CT ABDOMEN AND PELVIS WITHOUT CONTRAST 09/18/2024 11:01:26 PM   TECHNIQUE: CT of the abdomen and pelvis was performed without the administration of intravenous contrast. Multiplanar  reformatted images are provided for review. Automated exposure control, iterative reconstruction, and/or weight-based adjustment of the mA/kV was utilized to reduce the radiation dose to as low as reasonably achievable.   COMPARISON: 08/20/2024   CLINICAL HISTORY: Jejunostomy tube malfunction.   FINDINGS:   LOWER CHEST: Lung bases are free of acute infiltrate or sizable effusion.   LIVER: Pneumobilia is noted within the liver secondary to a metallic stent in the common bile duct.   GALLBLADDER AND BILE DUCTS: The gallbladder appears to have been surgically removed. No biliary ductal dilatation.   SPLEEN: The spleen is unremarkable.   PANCREAS: A pancreatic head mass is again seen, measuring approximately 5 cm. Visualization is  somewhat difficult with lack of IV contrast. Pancreatic body and tail are within normal limits.   ADRENAL GLANDS: The adrenal glands are unremarkable.   KIDNEYS, URETERS AND BLADDER: The left kidney shows no renal calculi or obstructive changes. Cysts are noted on the left, stable from the prior study. A soft tissue mass arising from the right kidney is again identified stable in appearance from the prior study. No obstructive changes are seen in the right kidney. No stones in the ureters. The bladder is partially distended.   GI AND BOWEL: Surgical changes in the stomach are noted. No obstructive or inflammatory changes of the colon are seen. Scattered diverticular change is noted without diverticulitis. The appendix is within normal limits. Small bowel shows no obstructive changes. A jejunostomy catheter is again seen in place. No kinking or other abnormality is identified.   PERITONEUM AND RETROPERITONEUM: There is mild free fluid in the pelvis is noted stable from the prior exam. No free air.   VASCULATURE: Aortic calcifications are seen.   LYMPH NODES: No lymphadenopathy.   REPRODUCTIVE ORGANS: The uterus has been surgically  removed.   BONES AND SOFT TISSUES: No acute osseous abnormality. No focal soft tissue abnormality.   IMPRESSION: 1. Persistent pancreatic head mass measuring approximately 5 cm, with visualization limited by lack of IV contrast. 2. Stable soft tissue mass arising from the right kidney. This is consistent with renal cell carcinoma. 3. Mild free fluid in the pelvis.   Electronically signed by: Oneil Devonshire MD 09/18/2024 11:14 PM EST RP Workstation: [id number]  Assessment:      Dislodged jejunostomy tube  Plan:     Initial attempt at placement of original dislodged jejunostomy tube patient provided was unsuccessful with some slight tenderness to palpation during attempt.  A slightly smaller 14 French gastrostomy tube available in the ED was then placed through the mature tract and this was able to be placed with no resistance flush against the bumper.  Balloon inflated to 5 mL with no resistance as well.  Bumper taped to the skin and balloon decompressed.  Due to the initial resistance noted on initial jejunostomy tube placement, CT scan completed to ensure the second tube was in proper place.  CT report as noted above.  The current tube balloon was inflated to 5 mL per manufacture recommendations and the bumper secured to the skin.  Recommended to patient that she reach out to surgery office back on Monday, to discuss possible replacement with a formal jejunostomy tube at that time if needed.  Otherwise okay to use the new tube for feeding in the meantime.  Case discussed with patient and family member and they verbalized understanding.  ED provider also notified as well.   labs/images/medications/previous chart entries reviewed personally and relevant changes/updates noted above.

## 2024-09-18 NOTE — ED Provider Notes (Signed)
 [facility] Provider Note    Event Date/Time   First MD Initiated Contact with Patient 09/18/24 2142     (approximate)   History   Dislodged Feeding Tube    HPI  Alyssa Leonard is a 68 y.o. female th medical history hypertension, OSA, morbid obesity with history of Roux-en-Y, CKD stage IIIb renal cell carcinoma, malignant neoplasm of the pancreas, paroxysmal A-fib on Eliquis , , hospitalized a month ago (07/13/2024 - 08/03/2024) with intractable vomiting and inability to tolerate p.o. due to large obstructing pancreatic mass s/p biliary stent 07/16/2024,s/p J-tube placement on 07/22/2024, being admitted with concerns for G-tube dysfunction with cellulitis     Robotic assisted laparoscopic jejunostomy tube placement # 18 FR   Pre-operative Diagnosis: malnutrition   Post-operative Diagnosis: same   Procedure:  Robotic assisted laparoscopic jejunostomy tube placement # 18 FR   Surgeon: Laneta Luna, MD FACS   Anesthesia: Gen. with endotracheal tube   Findings: No evidence of carcinomatosis  Extensive adhesions from prior surgeries, small bowel and omentum to the abdominal wall no bowel injuries J tube within the proximal, intraluminally and well positioned Very difficult case due to labor intensive enterolysis and patient body habitus   Estimated Blood Loss:10 cc                Physical Exam   Triage Vital Signs: ED Triage Vitals  Encounter Vitals Group     BP 09/18/24 2103 124/73     Girls Systolic BP Percentile --      Girls Diastolic BP Percentile --      Boys Systolic BP Percentile --      Boys Diastolic BP Percentile --      Pulse Rate 09/18/24 2103 (!) 112     Resp 09/18/24 2103 (!) 22     Temp 09/18/24 2103 98.1 F (36.7 C)     Temp Source 09/18/24 2103 Oral     SpO2 09/18/24 2103 99 %     Weight --      Height --      Head Circumference --      Peak Flow --      Pain Score 09/18/24 2104 6     Pain Loc --      Pain Education  --      Exclude from Growth Chart --     Most recent vital signs: Vitals:   09/18/24 2103  BP: 124/73  Pulse: (!) 112  Resp: (!) 22  Temp: 98.1 F (36.7 C)  SpO2: 99%    Nursing Triage Note reviewed. Vital signs reviewed and patients oxygen saturation is normoxic***  General: Patient is well nourished, well developed, awake and alert, resting comfortably in no acute distress Head: Normocephalic and atraumatic Eyes: Normal inspection, extraocular muscles intact, no conjunctival pallor Ear, nose, throat: Normal external exam Neck: Normal range of motion Respiratory: Patient is in no respiratory distress, lungs CTAB Cardiovascular: Patient is not tachycardic, RRR without murmur appreciated GI: Abd SNT with no guarding or rebound  Back: Normal inspection of the back with good strength and range of motion throughout all ext Extremities: pulses intact with good cap refills, no LE pitting edema or calf tenderness Neuro: The patient is alert and oriented to person, place, and time, appropriately conversive, with 5/5 bilat UE/LE strength, no gross motor or sensory defects noted. Coordination appears to be adequate. Skin: Warm, dry, and intact Psych: normal mood and affect, no SI or HI  ED Results /  Procedures / Treatments   Labs (all labs ordered are listed, but only abnormal results are displayed) Labs Reviewed  COMPREHENSIVE METABOLIC PANEL WITH GFR - Abnormal; Notable for the following components:      Result Value   Sodium 152 (*)    Chloride 119 (*)    Glucose, Bld 117 (*)    BUN 33 (*)    Creatinine, Ser 1.61 (*)    Albumin 2.7 (*)    Alkaline Phosphatase 146 (*)    GFR, Estimated 34 (*)    All other components within normal limits  CBC WITH DIFFERENTIAL/PLATELET - Abnormal; Notable for the following components:   WBC 11.5 (*)    RBC 2.83 (*)    Hemoglobin 8.3 (*)    HCT 27.5 (*)    RDW 16.8 (*)    nRBC 0.3 (*)    Neutro Abs 7.8 (*)    Abs Immature Granulocytes  0.12 (*)    All other components within normal limits  LACTIC ACID, PLASMA  LACTIC ACID, PLASMA     EKG   RADIOLOGY ***    PROCEDURES:  Critical Care performed: {CriticalCareYesNo:19197::Yes, see critical care procedure note(s),No}  Procedures   MEDICATIONS ORDERED IN ED: Medications - No data to display   IMPRESSION / MDM / ASSESSMENT AND PLAN / ED COURSE                                Differential diagnosis includes, but is not limited to, ***    ***   Clinical Course as of 09/18/24 2205  Sat Sep 18, 2024  2153 Sodium(!): 152 [HD]  2159 Dr. Ladean from surgery states that he will come by and evaluate [HD]  2201 Sodium(!): 152 At baseline for the patient [HD]    Clinical Course User Index [HD] Nicholaus Rolland BRAVO, MD   -- Risk: 5 This patient has a high risk of morbidity due to further diagnostic testing or treatment. Rationale: This patient's evaluation and management involve a high risk of morbidity due to the potential severity of presenting symptoms, need for diagnostic testing, and/or initiation of treatment that may require close monitoring. The differential includes conditions with potential for significant deterioration or requiring escalation of care. Treatment decisions in the ED, including medication administration, procedural interventions, or disposition planning, reflect this level of risk. COPA: 5 The patient has the following acute or chronic illness/injury that poses a possible threat to life or bodily function: [X] : The patient has a potentially serious acute condition or an acute exacerbation of a chronic illness requiring urgent evaluation and management in the Emergency Department. The clinical presentation necessitates immediate consideration of life-threatening or function-threatening diagnoses, even if they are ultimately ruled out.   FINAL CLINICAL IMPRESSION(S) / ED DIAGNOSES   Final diagnoses:  None     Rx / DC Orders   ED  Discharge Orders     None        Note:  This document was prepared using Dragon voice recognition software and may include unintentional dictation errors.

## 2024-09-18 NOTE — Discharge Instructions (Signed)

## 2024-09-18 NOTE — ED Triage Notes (Signed)
 Pt presents to ED with dislodged LLQ feeding tube she first noticed today. Sts tube is used for medication management - last used this afternoon at 4 pm. On assessment - tube is out with yellow white drainage from site.

## 2024-09-20 ENCOUNTER — Inpatient Hospital Stay

## 2024-09-20 ENCOUNTER — Telehealth: Payer: Self-pay | Admitting: Surgery

## 2024-09-20 NOTE — Telephone Encounter (Signed)
 Incoming call from Reena Roys, nurse with [facility] [phone]) Patient had GJ tube placed by Dr. Jordis.  They are having trouble with the GJ tube not working properly.  Please call nurse Reena to advise.  Thank you.

## 2024-09-21 ENCOUNTER — Other Ambulatory Visit: Payer: Self-pay

## 2024-09-21 ENCOUNTER — Other Ambulatory Visit: Payer: Self-pay | Admitting: Surgery

## 2024-09-21 ENCOUNTER — Inpatient Hospital Stay

## 2024-09-21 ENCOUNTER — Telehealth: Payer: Self-pay

## 2024-09-21 DIAGNOSIS — C641 Malignant neoplasm of right kidney, except renal pelvis: Secondary | ICD-10-CM

## 2024-09-21 DIAGNOSIS — T85528A Displacement of other gastrointestinal prosthetic devices, implants and grafts, initial encounter: Secondary | ICD-10-CM

## 2024-09-21 LAB — BASIC METABOLIC PANEL - CANCER CENTER ONLY
Anion gap: 9 (ref 5–15)
BUN: 31 mg/dL — ABNORMAL HIGH (ref 8–23)
CO2: 25 mmol/L (ref 22–32)
Calcium: 8.6 mg/dL — ABNORMAL LOW (ref 8.9–10.3)
Chloride: 115 mmol/L — ABNORMAL HIGH (ref 98–111)
Creatinine: 1.57 mg/dL — ABNORMAL HIGH (ref 0.44–1.00)
GFR, Estimated: 36 mL/min — ABNORMAL LOW (ref >60)
Glucose, Bld: 172 mg/dL — ABNORMAL HIGH (ref 70–99)
Potassium: 4.2 mmol/L (ref 3.5–5.1)
Sodium: 149 mmol/L — ABNORMAL HIGH (ref 135–145)

## 2024-09-21 NOTE — Telephone Encounter (Signed)
 Per Morna make sure the patient/family is aware of symptoms that she needs to be monitoring for for hypernatremia? She would need to go to ed if symptoms arise as we are closed Thursday and Friday. Na+ 149 today.   Outbound call to patient and informed the following are signs of hypernatremia including Dry mouth or tongue, Fatigue or feeling unusually tired, Irritability or restlessness, Weakness, Decreased urine output or dark-colored urine.   As sodium levels rise or dehydration worsens, symptoms can progress: Confusion or trouble thinking clearly, Muscle twitching or spasms, Increased reflexes, Headache, Nausea or vomiting, Rapid heart rate.  With Severe hypernatremia the brain is affected and can manifest as Severe confusion or disorientation, seizures, LOC, or coma.  Outbound call to patient; informed of above. Patient verbalized understanding and caregiver also listened on speaker phone.

## 2024-09-21 NOTE — Progress Notes (Signed)
 Nutrition Follow-up:  Seen in clinic following lab draw.    Patient with pancreatic mass, hospitalized (07/13/24-08/03/24) with intractable vomiting and inability to tolerate po due to obstructing pancreatic mass s/p bilary stent on 07/16/24, J tube placement on 07/22/24.  Recent admission with AKI, hypernatremia, dehydration.  Met with patient in clinic.  Noted seen in ED after J-tube fell out.  Was replaced with a tube but not the correct tube in the ED.  Planning new J-tube exchange tomorrow.  Husband reports that patient has been getting all 5.5 cartons ( ) of nutren 1.5 each day without missing any via pump at 62ml/hr from 7:30pm-11:30am.  Husband has been giving of water  via syringe 6 times a day (7:30pm, midnight, 5am, 11:30am, 2pm, 5pm) via J-tube.  Patient reports that she is tolerating this volume.  Reports bowel movement last night, not diarrhea.  Reports that she is having more urine output.  Husband is giving water  via gravity and administering slowly.    Labs: Na 149 (trending down), glucose 172, BUN 31, creatinine 1.57  Anthropometrics:   Weight 228 lb 7 oz today  230 lb 1.6 oz on 11/20 278 lb on 04/20/24   Estimated Energy Needs  Kcals: 2288-2600 Protein: 104-124 g Fluid: > 2 liters  NUTRITION DIAGNOSIS: Inadequate oral intake continues   INTERVENTION:  Discussed with NP, Morna and wants to increase free water  via J-tube.  Increase free water  to 240ml 6 times a day (7:30pm, midnight, 5am, 11:30am, 2pm, 5pm).  Continue nutren 1.5 at 22ml/hr from 7:30pm-11:30am.  Written instructions provided for husband.  He verbalized understanding. Feeding provides 2062 calories, 93.5 g protein, 10/94ml free water  formula only plus water  flush ( total).   Repeat lab work planned for 12/1.       MONITORING, EVALUATION, GOAL: weight trends, tube feeding tolerance   NEXT VISIT: Thursday, Dec 4th phone call  Nyan Dufresne B. Dasie SOLON, CSO, LDN Registered  Dietitian [phone]

## 2024-09-21 NOTE — Telephone Encounter (Signed)
 Left message for patient to call back. Called her to see if she can go today to get her tube exchange at 1:45pm.

## 2024-09-21 NOTE — Progress Notes (Signed)
 Patient for IR Jtube exchange on Wed 09/22/24, I called and spoke with the patient on the phone and gave pre-procedure instructions. Pt was made aware to be here at 1:45p and check in at the H&V entrance. Pt stated understanding.  Called 09/21/24

## 2024-09-22 ENCOUNTER — Ambulatory Visit
Admission: RE | Admit: 2024-09-22 | Discharge: 2024-09-22 | Disposition: A | Discharge status: Home / Self Care | Source: Ambulatory Visit | Attending: Surgery | Admitting: Surgery

## 2024-09-22 DIAGNOSIS — T85528A Displacement of other gastrointestinal prosthetic devices, implants and grafts, initial encounter: Secondary | ICD-10-CM

## 2024-09-22 DIAGNOSIS — Z434 Encounter for attention to other artificial openings of digestive tract: Secondary | ICD-10-CM | POA: Diagnosis present

## 2024-09-22 HISTORY — PX: IR REPLC GASTRO/COLONIC TUBE PERCUT W/FLUORO: IMG2333

## 2024-09-22 MED ORDER — IOHEXOL 300 MG/ML  SOLN
10.0000 mL | Freq: Once | INTRAMUSCULAR | Status: AC | PRN
  Administered 2024-09-22: 10 mL

## 2024-09-27 ENCOUNTER — Inpatient Hospital Stay: Attending: Oncology

## 2024-09-27 DIAGNOSIS — K8689 Other specified diseases of pancreas: Secondary | ICD-10-CM | POA: Diagnosis not present

## 2024-09-27 DIAGNOSIS — C641 Malignant neoplasm of right kidney, except renal pelvis: Secondary | ICD-10-CM

## 2024-09-27 DIAGNOSIS — K869 Disease of pancreas, unspecified: Secondary | ICD-10-CM | POA: Diagnosis present

## 2024-09-27 LAB — BASIC METABOLIC PANEL - CANCER CENTER ONLY
Anion gap: 11 (ref 5–15)
BUN: 30 mg/dL — ABNORMAL HIGH (ref 8–23)
CO2: 25 mmol/L (ref 22–32)
Calcium: 8.3 mg/dL — ABNORMAL LOW (ref 8.9–10.3)
Chloride: 104 mmol/L (ref 98–111)
Creatinine: 1.49 mg/dL — ABNORMAL HIGH (ref 0.44–1.00)
GFR, Estimated: 38 mL/min — ABNORMAL LOW (ref >60)
Glucose, Bld: 170 mg/dL — ABNORMAL HIGH (ref 70–99)
Potassium: 4.7 mmol/L (ref 3.5–5.1)
Sodium: 140 mmol/L (ref 135–145)

## 2024-09-27 NOTE — Progress Notes (Signed)
 Hematology/Oncology Consult note [facility]  [phone] [phone]  Patient Care Team: Entzminger, Ethridge LABOR, MD as PCP - General (Internal Medicine) Darron Deatrice LABOR, MD as PCP - Cardiology (Cardiology) Luke Elsie GRADE, MD as PCP - Hematology/Oncology (Oncology) Kennyth Chew, MD as PCP - Electrophysiology (Cardiology) Melanee Annah BROCKS, MD as Consulting Physician (Oncology) Maurie Rayfield BIRCH, RN as Oncology Nurse Navigator   Name of the patient: Alyssa Leonard  [id number]  1955-12-31   Date of visit: 09/27/24  Diagnosis- 1.  Stage IIIa metastatic renal cell carcinoma 2.  Pancreatic mass concerning for pancreatic cancer locally advanced  Chief complaint/ Reason for visit-posthospital discharge follow-up  Heme/Onc history: Patient is a 68 year old female with a past medical history significant for hypertension hyperlipidemia type 2 diabetes, paroxysmal atrial fibrillation sleep apnea and obesity among other medical problems.  She was seen by me in October 2024 after she was incidentally found to have a large exophytic mass in the right kidney measuring 8.9 cm approaching the duodenum.At that time she was also found to have a pancreatic mass which was approaching the duodenum.  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  Subsequently patient was found to have obstructive jaundice from pancreatic mass in August 2025 and underwent PTC drain placement with a plan to eventually put in the stent by interventional radiology   With regards to locally advanced metastatic renal cell carcinoma she was also found to have possible pulmonary involvement due to subcentimeter lung nodules.  T4 N0 possibly M1 and was started on immunotherapy with combination ipilimumab and nivolumab with a last cycle of nivolumab that was given in August 2025.  Subsequently cycles were  held due to concern for obstructive jaundice.   Patient has now been referred back to me for management of her pancreatic mass and renal cell carcinoma.  She was supposed to get a repeat pancreatic biopsy which had to be postponed due to her recent episode of obstructive jaundice.   Patient was hospitalized in September 2025 for AKI and failure to thrive.  Patient was unable to tolerate oral feeds due to significant nausea and vomiting and therefore had J-tube placement placed given the concern for potential gastric outlet obstruction due to both the pancreatic and renal mass encroaching the duodenum and prior history of gastric bypass precluding placement of a PEG tube.  Renal function improved with hydration in the hospital.  PTC was also internalized during that hospitalization.  Interval history- Discussed the use of AI scribe software for clinical note transcription with the patient, who gave verbal consent to proceed.  History of Present Illness   Alyssa Leonard is a 68 year old female with kidney cancer and a pancreatic mass who presents with nutritional concerns related to J tube feeding.  She is currently reliant on a J tube for nutrition due to the risk of aspiration, which prevents her from consuming food orally. She has only been able to take minimal sips of water . During her recent hospital stay, she experienced a lack of appetite and found the taste of food unappealing, though she expresses a desire to attempt eating again.  The J tube occasionally causes pain and irritation at the insertion site, but the feeding process is proceeding well with the support of the nutrition team. She no longer has an external drain, as it has been internalized with a stent.  She  has a history of kidney cancer and was receiving immunotherapy at [facility]. Additionally, she has a large pancreatic mass that has grown over the past few months.  Her last CT scan was at the end of October, and she is scheduled  for a whole-body scan in about three weeks to assess the current status of her cancer. Her kidney function was suboptimal during her last hospital stay, and her kidney numbers were not optimal a month ago.       ECOG PS- 3 Pain scale- 3 Opioid associated constipation- no  Review of systems- Review of Systems  Constitutional:  Positive for malaise/fatigue. Negative for chills, fever and weight loss.  HENT:  Negative for congestion, ear discharge and nosebleeds.   Eyes:  Negative for blurred vision.  Respiratory:  Negative for cough, hemoptysis, sputum production, shortness of breath and wheezing.   Cardiovascular:  Negative for chest pain, palpitations, orthopnea and claudication.  Gastrointestinal:  Negative for abdominal pain, blood in stool, constipation, diarrhea, heartburn, melena, nausea and vomiting.  Genitourinary:  Negative for dysuria, flank pain, frequency, hematuria and urgency.  Musculoskeletal:  Negative for back pain, joint pain and myalgias.  Skin:  Negative for rash.  Neurological:  Negative for dizziness, tingling, focal weakness, seizures, weakness and headaches.  Endo/Heme/Allergies:  Does not bruise/bleed easily.  Psychiatric/Behavioral:  Negative for depression and suicidal ideas. The patient does not have insomnia.       Allergies  Allergen Reactions   Latex Other (See Comments), Dermatitis, Hives and Swelling    unknown  Other reaction(s): Other (See Comments)  unknown  Other reaction(s): Other (See Comments) unknown    unknown   Aspirin Other (See Comments) and Nausea And Vomiting    Makes stomach burn  Other reaction(s): Other (See Comments)  Makes stomach burn  Makes stomach burn    Other reaction(s): Other (See Comments) Makes stomach burn   Plasticized Base [Plastibase]      Past Medical History:  Diagnosis Date   Asthma    Atrial flutter (HCC)    COPD (chronic obstructive pulmonary disease) (HCC)    Diabetes mellitus without  complication (HCC)    GERD (gastroesophageal reflux disease)    History of colon polyps    Hypertension    Meniere disease    OSA (obstructive sleep apnea)    Renal cell carcinoma (HCC)    Sleep apnea    Went for sleep study test in January 2018 I haven't heard back from test     Past Surgical History:  Procedure Laterality Date   ABDOMINAL HYSTERECTOMY     BARIATRIC SURGERY     CHOLECYSTECTOMY     COLONOSCOPY WITH PROPOFOL  N/A 12/11/2016   Procedure: COLONOSCOPY WITH PROPOFOL ;  Surgeon: Lamar ONEIDA Holmes, MD;  Location: [facility] ENDOSCOPY;  Service: Endoscopy;  Laterality: N/A;   COLONOSCOPY WITH PROPOFOL  N/A 11/27/2021   Procedure: COLONOSCOPY WITH PROPOFOL ;  Surgeon: Maryruth Ole ONEIDA, MD;  Location: [facility] ENDOSCOPY;  Service: Endoscopy;  Laterality: N/A;   INSERTION, JEJUNOSTOMY TUBE, ROBOT-ASSISTED N/A 07/22/2024   Procedure: INSERTION, JEJUNOSTOMY TUBE, ROBOT-ASSISTED;  Surgeon: Jordis Laneta FALCON, MD;  Location: [facility] ORS;  Service: General;  Laterality: N/A;   IR BILIARY STENT(S) EXIST ACCESS INC DILAT CATH EXCH/REMOVAL  07/16/2024   IR CONVERT BILIARY DRAIN TO INT EXT BILIARY DRAIN  06/29/2024   IR EXCHANGE BILIARY DRAIN  06/22/2024   IR INT EXT BILIARY DRAIN WITH CHOLANGIOGRAM  06/09/2024   IR RADIOLOGIST EVAL & MGMT  06/15/2024   IR  REPLC GASTRO/COLONIC TUBE PERCUT W/FLUORO  09/22/2024    Social History   Socioeconomic History   Marital status: Married    Spouse name: Not on file   Number of children: Not on file   Years of education: Not on file   Highest education level: Not on file  Occupational History   Not on file  Tobacco Use   Smoking status: Former    Current packs/day: 0.00    Types: Cigarettes    Quit date: 11/14/1993    Years since quitting: 30.8   Smokeless tobacco: Never  Vaping Use   Vaping status: Never Used  Substance and Sexual Activity   Alcohol use: No   Drug use: No   Sexual activity: Not Currently  Other Topics Concern   Not on file  Social  History Narrative   Not on file   Social Drivers of Health   Financial Resource Strain: Low Risk  (06/01/2024)   Received from [facility]   Overall Financial Resource Strain (CARDIA)    Difficulty of Paying Living Expenses: Not hard at all  Food Insecurity: Patient Declined (08/21/2024)   Hunger Vital Sign    Worried About Running Out of Food in the Last Year: Patient declined    Ran Out of Food in the Last Year: Patient declined  Transportation Needs: Patient Declined (08/21/2024)   PRAPARE - Administrator, Civil Service (Medical): Patient declined    Lack of Transportation (Non-Medical): Patient declined  Physical Activity: Not on file  Stress: Not on file  Social Connections: Patient Declined (08/21/2024)   Social Connection and Isolation Panel    Frequency of Communication with Friends and Family: Patient declined    Frequency of Social Gatherings with Friends and Family: Patient declined    Attends Religious Services: Patient declined    Database Administrator or Organizations: Patient declined    Attends Banker Meetings: Patient declined    Marital Status: Patient declined  Intimate Partner Violence: Patient Declined (08/21/2024)   Humiliation, Afraid, Rape, and Kick questionnaire    Fear of Current or Ex-Partner: Patient declined    Emotionally Abused: Patient declined    Physically Abused: Patient declined    Sexually Abused: Patient declined    Family History  Problem Relation Age of Onset   Hypertension Mother    Kidney disease Sister    Kidney disease Brother    Heart disease Brother    Heart disease Brother    Breast cancer Neg Hx      Current Outpatient Medications:    albuterol  (PROVENTIL ) (2.5 MG/3ML) 0.083% nebulizer solution, Take 2.5 mg by nebulization every 4 (four) hours as needed for wheezing or shortness of breath., Disp: , Rfl:    apixaban  (ELIQUIS ) 5 MG TABS tablet, Take 1 tablet (5 mg total) by mouth 2  (two) times daily., Disp: 60 tablet, Rfl: 3   dibucaine (NUPERCAINAL) 1 % OINT, Place 1 Application rectally as needed for hemorrhoids., Disp: , Rfl:    dorzolamide  (TRUSOPT ) 2 % ophthalmic solution, Place 1 drop into both eyes 2 (two) times daily., Disp: , Rfl:    famotidine  (PEPCID ) 20 MG tablet, Place 1 tablet (20 mg total) into feeding tube 3 times/day as needed-between meals & bedtime for heartburn or indigestion., Disp: 90 tablet, Rfl: 0   fiber supplement, BANATROL TF, liquid, Place 60 mLs into feeding tube 2 (two) times daily., Disp: 60 mL, Rfl: 0   folic acid  (FOLVITE ) 1  MG tablet, Place 1 tablet (1 mg total) into feeding tube daily., Disp: 30 tablet, Rfl: 0   metoprolol  tartrate (LOPRESSOR ) 25 MG tablet, Place 1 tablet (25 mg total) into feeding tube 2 (two) times daily., Disp: 60 tablet, Rfl: 0   Nutritional Supplements (FEEDING SUPPLEMENT, OSMOLITE 1.5 CAL,) LIQD, 1,350 mLs by Per J Tube route daily., Disp: , Rfl:    OLANZapine  (ZYPREXA ) 10 MG tablet, Place 1 tablet (10 mg total) into feeding tube at bedtime., Disp: 30 tablet, Rfl: 0   oxyCODONE  (OXY IR/ROXICODONE ) 5 MG immediate release tablet, Take 5 mg by mouth every 8 (eight) hours as needed for moderate pain (pain score 4-6) or severe pain (pain score 7-10)., Disp: , Rfl:    sodium chloride  flush 0.9 % SOLN injection, Flush biliary cathter with 5-10mL saline daily to prevent clogging, Disp: 300 mL, Rfl: 1   Multiple Vitamin (MULTIVITAMIN WITH MINERALS) TABS tablet, Place 1 tablet into feeding tube daily. (Patient not taking: Reported on 09/16/2024), Disp: 30 tablet, Rfl: 0   Multiple Vitamins-Minerals (CERTAVITE/ANTIOXIDANTS) TABS, 1 tablet into feeding tube Orally daily (Patient not taking: Reported on 09/16/2024), Disp: , Rfl:   Physical exam:  Vitals:   09/16/24 0944  BP: (!) 124/92  Pulse: (!) 117  Resp: 18  Temp: (!) 96.6 F (35.9 C)  TempSrc: Tympanic  SpO2: 100%  Weight: 230 lb 1.6 oz (104.4 kg)  Height: 5' (1.524 m)    Physical Exam Cardiovascular:     Rate and Rhythm: Normal rate and regular rhythm.     Heart sounds: Normal heart sounds.  Pulmonary:     Effort: Pulmonary effort is normal.     Breath sounds: Normal breath sounds.  Abdominal:     General: Bowel sounds are normal.     Palpations: Abdomen is soft.     Comments: J-tube in place  Skin:    General: Skin is warm and dry.  Neurological:     Mental Status: She is alert and oriented to person, place, and time.      I have personally reviewed labs listed below:    Latest Ref Rng & Units 09/27/2024   10:34 AM  CMP  Glucose 70 - 99 mg/dL 829   BUN 8 - 23 mg/dL 30   Creatinine 9.55 - 1.00 mg/dL 8.50   Sodium 864 - 854 mmol/L 140   Potassium 3.5 - 5.1 mmol/L 4.7   Chloride 98 - 111 mmol/L 104   CO2 22 - 32 mmol/L 25   Calcium  8.9 - 10.3 mg/dL 8.3       Latest Ref Rng & Units 09/18/2024    9:06 PM  CBC  WBC 4.0 - 10.5 K/uL 11.5   Hemoglobin 12.0 - 15.0 g/dL 8.3   Hematocrit 63.9 - 46.0 % 27.5   Platelets 150 - 400 K/uL 190    I have personally reviewed Radiology images listed below: No images are attached to the encounter.  IR REPLACE GASTRO/JEJUNO TUBE PERC W/FLUORO Result Date: 09/23/2024 INDICATION: 68 year old female with direct jejunostomy tube which has become displaced. She presents for replacement. EXAM: Jejunostomy tube replacement under fluoroscopy MEDICATIONS: None ANESTHESIA/SEDATION: None CONTRAST:  10 mL Isovue-300-administered into the gastric lumen. FLUOROSCOPY: Radiation Exposure Index (as provided by the fluoroscopic device): 18.4 mGy Kerma COMPLICATIONS: None immediate. PROCEDURE: Informed written consent was obtained from the patient after a thorough discussion of the procedural risks, benefits and alternatives. All questions were addressed. Maximal Sterile Barrier Technique was utilized including caps, mask, sterile  gowns, sterile gloves, sterile drape, hand hygiene and skin antiseptic. A timeout was performed  prior to the initiation of the procedure. A kangaroo gastrostomy tube is currently within the stoma of the jejunostomy. Contrast was injected through this tube confirming its tip is present within the jejunum. A stiff glidewire was advanced through the device and into the jejunum. The retention balloon was deflated and the tube was removed over the wire. A new 18 French jejunostomy tube was cut to length and advanced over the wire into the jejunum. The retention balloon was filled with 8 mL sterile saline. Contrast was injected through the tube confirming its location well within the jejunum. The external bumper was fixed in place. The tube was flushed and capped. IMPRESSION: Successful replacement of a new 55 French percutaneous jejunostomy tube. The tube is ready for immediate use. Electronically Signed   By: Wilkie Lent M.D.   On: 09/23/2024 07:21   CT ABDOMEN PELVIS WO CONTRAST Result Date: 09/18/2024 EXAM: CT ABDOMEN AND PELVIS WITHOUT CONTRAST 09/18/2024 11:01:26 PM TECHNIQUE: CT of the abdomen and pelvis was performed without the administration of intravenous contrast. Multiplanar reformatted images are provided for review. Automated exposure control, iterative reconstruction, and/or weight-based adjustment of the mA/kV was utilized to reduce the radiation dose to as low as reasonably achievable. COMPARISON: 08/20/2024 CLINICAL HISTORY: Jejunostomy tube malfunction. FINDINGS: LOWER CHEST: Lung bases are free of acute infiltrate or sizable effusion. LIVER: Pneumobilia is noted within the liver secondary to a metallic stent in the common bile duct. GALLBLADDER AND BILE DUCTS: The gallbladder appears to have been surgically removed. No biliary ductal dilatation. SPLEEN: The spleen is unremarkable. PANCREAS: A pancreatic head mass is again seen, measuring approximately 5 cm. Visualization is somewhat difficult with lack of IV contrast. Pancreatic body and tail are within normal limits. ADRENAL GLANDS:  The adrenal glands are unremarkable. KIDNEYS, URETERS AND BLADDER: The left kidney shows no renal calculi or obstructive changes. Cysts are noted on the left, stable from the prior study. A soft tissue mass arising from the right kidney is again identified stable in appearance from the prior study. No obstructive changes are seen in the right kidney. No stones in the ureters. The bladder is partially distended. GI AND BOWEL: Surgical changes in the stomach are noted. No obstructive or inflammatory changes of the colon are seen. Scattered diverticular change is noted without diverticulitis. The appendix is within normal limits. Small bowel shows no obstructive changes. A jejunostomy catheter is again seen in place. No kinking or other abnormality is identified. PERITONEUM AND RETROPERITONEUM: There is mild free fluid in the pelvis is noted stable from the prior exam. No free air. VASCULATURE: Aortic calcifications are seen. LYMPH NODES: No lymphadenopathy. REPRODUCTIVE ORGANS: The uterus has been surgically removed. BONES AND SOFT TISSUES: No acute osseous abnormality. No focal soft tissue abnormality. IMPRESSION: 1. Persistent pancreatic head mass measuring approximately 5 cm, with visualization limited by lack of IV contrast. 2. Stable soft tissue mass arising from the right kidney. This is consistent with renal cell carcinoma. 3. Mild free fluid in the pelvis. Electronically signed by: Oneil Devonshire MD 09/18/2024 11:14 PM EST RP Workstation: [id number]     Assessment and plan- Patient is a 68 y.o. female with history of locally advanced renal cell carcinoma and pancreatic mass here for posthospital discharge follow-up  Assessment and Plan    Feeding difficulty due to gastrointestinal obstruction from abdominal masses Feeding difficulty due to gastrointestinal obstruction from pancreatic and kidney masses. Jejunostomy  tube bypasses obstruction for nutrition delivery. No appetite and discomfort with oral  intake likely due to obstruction. - Referred to speech pathology for evaluation of oral intake capability.  Patient wants to try oral feeds again but I have explained to her that it is highly unlikely that her J-tube can be taken out - Continue jejunostomy tube feeding.  Will coordinate with nutrition to facilitate G-tube feeds  Malignant neoplasm of kidney, unspecified Kidney cancer managed with immunotherapy at [facility]. Focus on pancreatic mass, but kidney function affects chemotherapy options. - Checked current kidney function with blood work.  Suspected malignant neoplasm of pancreas Large pancreatic mass suspected to be cancer. Biopsy needed for confirmation. Surgery not viable due to local advancement. Chemotherapy limited by kidney function. - Referred to [facility] for biopsy of pancreatic mass. - Plan whole body scan in three weeks to assess cancer status.  Pain at jejunostomy tube site Intermittent pain at jejunostomy tube site, possibly from tube irritation. Tube functions well for nutrition.  Chronic kidney disease Chronic kidney disease impacts potential chemotherapy for pancreatic cancer. Recent kidney function tests suboptimal. - Checked current kidney function with blood work.   Hypernatremia: Sodium 157 today.  Trying to avoid another hospitalization at this time and I will repeat her BMP in 1 week's time.  Jollie Allen from nutrition will be following the patient regarding increasing free water  intake to manage her hypernatremia        Visit Diagnosis 1. Renal cell carcinoma of right kidney (HCC)   2. Pancreatic mass   3. Hypernatremia      Dr. Annah Skene, MD, MPH [facility] [phone] 09/27/2024 2:59 PM

## 2024-09-30 ENCOUNTER — Inpatient Hospital Stay

## 2024-09-30 NOTE — Progress Notes (Signed)
 Nutrition Follow-up:  Patient with pancreatic mass, hospitalized (07/13/24-08/03/24) with intractable vomiting and inability to tolerate po due to obstructing pancreatic mass s/p bilary stent on 07/16/24, J tube placeemnt on 07/22/24.  Recent admission with AKI, hypernatremia, and dehydration.   Spoke with husband via phone.  He has been giving 240 ml water  flush at 7:30 pm, midnight, 5 am, 11:30 am, 2 pm, 5 pm.  He reports that patient is tolerating without difficulty.  Patient is tolerating nutren 1.5 (5.5 cartons) via continuous feeding pump at 80 ml/hr from 7:30 pm-11:30 am.    EGD planned at [facility] on 12/9   Labs: 12/1 Na 140 normal K 4.7, glucose 170 BUN 30, creatinine 1.49  Anthropometrics:   230 lb 1.6 oz on 11/20 255 lb 11.7 on 10/28 243 lb 2.7 oz on 10/27 235 lb 14.3 oz on 10/3 278 lb on 04/20/24   Re-Estimated Energy Needs  Kcals: 2288-2600 Protein: 104-124 g Fluid: > 2.2 liters  NUTRITION DIAGNOSIS: Inadequate oral intake continues relying on J tube   INTERVENTION:  Continue nutren 1.5 at 80ml/hr continues pump feeding via J-tube from 7:30-11:30pm.  Free water  flush of at 7:30pm, midnight, 5am, 11:30am, 2pm, 5pm).   Feeding provides 2062 calories, 93.5 g protein, free water  from formula plus 1440ml water  flush (2439ml/day total).   Monitor weight, may need to adjust tube feeding regimen Husband has contact information    MONITORING, EVALUATION, GOAL: weight trends, tube feeding   NEXT VISIT: phone call 1/13  Alyssa Leonard SOLON, CSO, LDN Registered Dietitian [phone]

## 2024-10-20 ENCOUNTER — Encounter: Payer: Self-pay | Admitting: Oncology

## 2024-11-01 ENCOUNTER — Ambulatory Visit
Admission: RE | Admit: 2024-11-01 | Discharge: 2024-11-01 | Disposition: A | Discharge status: Home / Self Care | Source: Ambulatory Visit | Attending: Oncology | Admitting: Oncology

## 2024-11-01 ENCOUNTER — Ambulatory Visit: Admission: RE | Admit: 2024-11-01 | Source: Ambulatory Visit

## 2024-11-01 DIAGNOSIS — C641 Malignant neoplasm of right kidney, except renal pelvis: Secondary | ICD-10-CM | POA: Insufficient documentation

## 2024-11-01 MED ORDER — GADOBUTROL 1 MMOL/ML IV SOLN
10.0000 mL | Freq: Once | INTRAVENOUS | Status: AC | PRN
  Administered 2024-11-01: 10 mL via INTRAVENOUS

## 2024-11-05 ENCOUNTER — Other Ambulatory Visit: Payer: Self-pay

## 2024-11-05 DIAGNOSIS — C641 Malignant neoplasm of right kidney, except renal pelvis: Secondary | ICD-10-CM

## 2024-11-08 ENCOUNTER — Inpatient Hospital Stay: Attending: Oncology

## 2024-11-08 ENCOUNTER — Encounter: Payer: Self-pay | Admitting: Oncology

## 2024-11-08 ENCOUNTER — Inpatient Hospital Stay: Admitting: Oncology

## 2024-11-08 VITALS — BP 118/47 | HR 150 | Temp 98.6°F | Resp 20 | Wt 237.8 lb

## 2024-11-08 DIAGNOSIS — R64 Cachexia: Secondary | ICD-10-CM | POA: Insufficient documentation

## 2024-11-08 DIAGNOSIS — C259 Malignant neoplasm of pancreas, unspecified: Secondary | ICD-10-CM

## 2024-11-08 DIAGNOSIS — Z87891 Personal history of nicotine dependence: Secondary | ICD-10-CM | POA: Diagnosis not present

## 2024-11-08 DIAGNOSIS — Z79899 Other long term (current) drug therapy: Secondary | ICD-10-CM | POA: Diagnosis not present

## 2024-11-08 DIAGNOSIS — R269 Unspecified abnormalities of gait and mobility: Secondary | ICD-10-CM | POA: Insufficient documentation

## 2024-11-08 DIAGNOSIS — Z7901 Long term (current) use of anticoagulants: Secondary | ICD-10-CM | POA: Insufficient documentation

## 2024-11-08 DIAGNOSIS — D630 Anemia in neoplastic disease: Secondary | ICD-10-CM | POA: Diagnosis not present

## 2024-11-08 DIAGNOSIS — C641 Malignant neoplasm of right kidney, except renal pelvis: Secondary | ICD-10-CM

## 2024-11-08 DIAGNOSIS — M109 Gout, unspecified: Secondary | ICD-10-CM | POA: Insufficient documentation

## 2024-11-08 DIAGNOSIS — R627 Adult failure to thrive: Secondary | ICD-10-CM | POA: Insufficient documentation

## 2024-11-08 DIAGNOSIS — F1320 Sedative, hypnotic or anxiolytic dependence, uncomplicated: Secondary | ICD-10-CM | POA: Insufficient documentation

## 2024-11-08 DIAGNOSIS — N941 Unspecified dyspareunia: Secondary | ICD-10-CM | POA: Insufficient documentation

## 2024-11-08 DIAGNOSIS — K769 Liver disease, unspecified: Secondary | ICD-10-CM | POA: Insufficient documentation

## 2024-11-08 DIAGNOSIS — K529 Noninfective gastroenteritis and colitis, unspecified: Secondary | ICD-10-CM | POA: Insufficient documentation

## 2024-11-08 DIAGNOSIS — I700 Atherosclerosis of aorta: Secondary | ICD-10-CM | POA: Insufficient documentation

## 2024-11-08 DIAGNOSIS — Z931 Gastrostomy status: Secondary | ICD-10-CM | POA: Insufficient documentation

## 2024-11-08 LAB — CMP (CANCER CENTER ONLY)
ALT: 9 U/L (ref 0–44)
AST: 16 U/L (ref 15–41)
Albumin: 2.4 g/dL — ABNORMAL LOW (ref 3.5–5.0)
Alkaline Phosphatase: 159 U/L — ABNORMAL HIGH (ref 38–126)
Anion gap: 12 (ref 5–15)
BUN: 29 mg/dL — ABNORMAL HIGH (ref 8–23)
CO2: 23 mmol/L (ref 22–32)
Calcium: 9 mg/dL (ref 8.9–10.3)
Chloride: 107 mmol/L (ref 98–111)
Creatinine: 1.6 mg/dL — ABNORMAL HIGH (ref 0.44–1.00)
GFR, Estimated: 35 mL/min — ABNORMAL LOW
Glucose, Bld: 184 mg/dL — ABNORMAL HIGH (ref 70–99)
Potassium: 4.4 mmol/L (ref 3.5–5.1)
Sodium: 142 mmol/L (ref 135–145)
Total Bilirubin: 0.4 mg/dL (ref 0.0–1.2)
Total Protein: 7.1 g/dL (ref 6.5–8.1)

## 2024-11-08 LAB — CBC WITH DIFFERENTIAL (CANCER CENTER ONLY)
Abs Immature Granulocytes: 0.2 K/uL — ABNORMAL HIGH (ref 0.00–0.07)
Basophils Absolute: 0 K/uL (ref 0.0–0.1)
Basophils Relative: 0 %
Eosinophils Absolute: 0 K/uL (ref 0.0–0.5)
Eosinophils Relative: 0 %
HCT: 23.6 % — ABNORMAL LOW (ref 36.0–46.0)
Hemoglobin: 7.5 g/dL — ABNORMAL LOW (ref 12.0–15.0)
Immature Granulocytes: 1 %
Lymphocytes Relative: 20 %
Lymphs Abs: 4 K/uL (ref 0.7–4.0)
MCH: 30.9 pg (ref 26.0–34.0)
MCHC: 31.8 g/dL (ref 30.0–36.0)
MCV: 97.1 fL (ref 80.0–100.0)
Monocytes Absolute: 1.9 K/uL — ABNORMAL HIGH (ref 0.1–1.0)
Monocytes Relative: 10 %
Neutro Abs: 13.9 K/uL — ABNORMAL HIGH (ref 1.7–7.7)
Neutrophils Relative %: 69 %
Platelet Count: 330 K/uL (ref 150–400)
RBC: 2.43 MIL/uL — ABNORMAL LOW (ref 3.87–5.11)
RDW: 14.9 % (ref 11.5–15.5)
WBC Count: 20 K/uL — ABNORMAL HIGH (ref 4.0–10.5)
nRBC: 0.2 % (ref 0.0–0.2)

## 2024-11-08 NOTE — Progress Notes (Signed)
 "    Hematology/Oncology Consult note [facility]  [phone] [phone]  Patient Care Team: Entzminger, Ethridge LABOR, MD as PCP - General (Internal Medicine) Darron Deatrice LABOR, MD as PCP - Cardiology (Cardiology) Luke Elsie GRADE, MD as PCP - Hematology/Oncology (Oncology) Kennyth Chew, MD as PCP - Electrophysiology (Cardiology) Melanee Annah BROCKS, MD as Consulting Physician (Oncology) Maurie Rayfield BIRCH, RN as Oncology Nurse Navigator   Name of the patient: Alyssa Leonard  [id number]  1955-12-23   Date of visit: 11/08/2024  Diagnosis-  1.  Stage IIIa metastatic renal cell carcinoma 2.  Pancreatic mass concerning for pancreatic cancer locally advanced  Chief complaint/ Reason for visit-discuss pathology results and further management  Heme/Onc history:  Patient is a 69 year old female with a past medical history significant for hypertension hyperlipidemia type 2 diabetes, paroxysmal atrial fibrillation sleep apnea and obesity among other medical problems.  She was seen by me in October 2024 after she was incidentally found to have a large exophytic mass in the right kidney measuring 8.9 cm approaching the duodenum.At that time she was also found to have a pancreatic mass which was approaching the duodenum.  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  Subsequently patient was found to have obstructive jaundice from pancreatic mass in August 2025 and underwent PTC drain placement with a plan to eventually put in the stent by interventional radiology   With regards to locally advanced metastatic renal cell carcinoma she was also found to have possible pulmonary involvement due to subcentimeter lung nodules.  T4 N0 possibly M1 and was started on immunotherapy with combination ipilimumab and nivolumab with a last cycle of nivolumab that was given in August 2025.   Subsequently cycles were held due to concern for obstructive jaundice.   Patient has now been referred back to me for management of her pancreatic mass and renal cell carcinoma.  She was supposed to get a repeat pancreatic biopsy which had to be postponed due to her recent episode of obstructive jaundice.   Patient was hospitalized in September 2025 for AKI and failure to thrive.  Patient was unable to tolerate oral feeds due to significant nausea and vomiting and therefore had J-tube placement placed given the concern for potential gastric outlet obstruction due to both the pancreatic and renal mass encroaching the duodenum and prior history of gastric bypass precluding placement of a PEG tube.   Renal function improved with hydration in the hospital.  PTC was also internalized during that hospitalization.  Interval history- Discussed the use of AI scribe software for clinical note transcription with the patient, who gave verbal consent to proceed.  History of Present Illness   Alyssa Leonard is a 69 year old female with advanced pancreatic cancer and renal mass who presents for oncology follow-up and hospice care management.  She has a biopsy-confirmed pancreatic mass with extensive vascular involvement and duodenal obstruction, not amenable to surgical resection due to size, vascular encasement, and comorbidities. A duodenal stent was placed last week to palliate obstruction, and she remains dependent on tube feeds. She also has a large renal mass consistent with kidney cancer, encroaching on the duodenum.  She continues to experience persistent nausea and vomiting and has been unable to tolerate oral intake for several months. Nutrition is maintained via a feeding tube, which was recently replaced after complications. She previously had a biliary drain, now internalized, and currently has  no issues with the feeding tube.  She is significantly anemic, with a recent hemoglobin of 7.5, and has  ongoing renal dysfunction. She does not wish to pursue chemotherapy due to concerns about side effects and lack of meaningful benefit, and prefers to avoid further hospitalizations, as prior admissions did not improve her symptoms.  She is currently enrolled in hospice care, with her daughter actively involved. Hospice is providing tube feeds, a new feeding machine, and a new bed.       ECOG PS- 3 Pain scale- 3 Opioid associated constipation- no  Review of systems- Review of Systems  Constitutional:  Positive for malaise/fatigue. Negative for chills, fever and weight loss.  HENT:  Negative for congestion, ear discharge and nosebleeds.   Eyes:  Negative for blurred vision.  Respiratory:  Negative for cough, hemoptysis, sputum production, shortness of breath and wheezing.   Cardiovascular:  Negative for chest pain, palpitations, orthopnea and claudication.  Gastrointestinal:  Positive for nausea and vomiting. Negative for abdominal pain, blood in stool, constipation, diarrhea, heartburn and melena.  Genitourinary:  Negative for dysuria, flank pain, frequency, hematuria and urgency.  Musculoskeletal:  Negative for back pain, joint pain and myalgias.  Skin:  Negative for rash.  Neurological:  Negative for dizziness, tingling, focal weakness, seizures, weakness and headaches.  Endo/Heme/Allergies:  Does not bruise/bleed easily.  Psychiatric/Behavioral:  Negative for depression and suicidal ideas. The patient does not have insomnia.       Allergies[1]   Past Medical History:  Diagnosis Date   Asthma    Atrial flutter (HCC)    COPD (chronic obstructive pulmonary disease) (HCC)    Diabetes mellitus without complication (HCC)    GERD (gastroesophageal reflux disease)    History of colon polyps    Hypertension    Meniere disease    OSA (obstructive sleep apnea)    Renal cell carcinoma (HCC)    Sleep apnea    Went for sleep study test in January 2018 I haven't heard back from test      Past Surgical History:  Procedure Laterality Date   ABDOMINAL HYSTERECTOMY     BARIATRIC SURGERY     CHOLECYSTECTOMY     COLONOSCOPY WITH PROPOFOL  N/A 12/11/2016   Procedure: COLONOSCOPY WITH PROPOFOL ;  Surgeon: Lamar ONEIDA Holmes, MD;  Location: [facility] ENDOSCOPY;  Service: Endoscopy;  Laterality: N/A;   COLONOSCOPY WITH PROPOFOL  N/A 11/27/2021   Procedure: COLONOSCOPY WITH PROPOFOL ;  Surgeon: Maryruth Ole ONEIDA, MD;  Location: [facility] ENDOSCOPY;  Service: Endoscopy;  Laterality: N/A;   INSERTION, JEJUNOSTOMY TUBE, ROBOT-ASSISTED N/A 07/22/2024   Procedure: INSERTION, JEJUNOSTOMY TUBE, ROBOT-ASSISTED;  Surgeon: Jordis Laneta FALCON, MD;  Location: [facility] ORS;  Service: General;  Laterality: N/A;   IR BILIARY STENT(S) EXIST ACCESS INC DILAT CATH EXCH/REMOVAL  07/16/2024   IR CONVERT BILIARY DRAIN TO INT EXT BILIARY DRAIN  06/29/2024   IR EXCHANGE BILIARY DRAIN  06/22/2024   IR INT EXT BILIARY DRAIN WITH CHOLANGIOGRAM  06/09/2024   IR RADIOLOGIST EVAL & MGMT  06/15/2024   IR REPLC GASTRO/COLONIC TUBE PERCUT W/FLUORO  09/22/2024    Social History   Socioeconomic History   Marital status: Married    Spouse name: Not on file   Number of children: Not on file   Years of education: Not on file   Highest education level: Not on file  Occupational History   Not on file  Tobacco Use   Smoking status: Former    Current packs/day: 0.00    Types: Cigarettes  Quit date: 11/14/1993    Years since quitting: 31.0   Smokeless tobacco: Never  Vaping Use   Vaping status: Never Used  Substance and Sexual Activity   Alcohol use: No   Drug use: No   Sexual activity: Not Currently  Other Topics Concern   Not on file  Social History Narrative   Not on file   Social Drivers of Health   Tobacco Use: Medium Risk (11/08/2024)   Patient History    Smoking Tobacco Use: Former    Smokeless Tobacco Use: Never    Passive Exposure: Not on file  Financial Resource Strain: Low Risk  (06/01/2024)   Received from  [facility]   Overall Financial Resource Strain (CARDIA)    Difficulty of Paying Living Expenses: Not hard at all  Food Insecurity: Patient Declined (08/21/2024)   Epic    Worried About Programme Researcher, Broadcasting/film/video in the Last Year: Patient declined    Barista in the Last Year: Patient declined  Transportation Needs: Patient Declined (08/21/2024)   Epic    Lack of Transportation (Medical): Patient declined    Lack of Transportation (Non-Medical): Patient declined  Physical Activity: Not on file  Stress: Not on file  Social Connections: Patient Declined (08/21/2024)   Social Connection and Isolation Panel    Frequency of Communication with Friends and Family: Patient declined    Frequency of Social Gatherings with Friends and Family: Patient declined    Attends Religious Services: Patient declined    Active Member of Clubs or Organizations: Patient declined    Attends Banker Meetings: Patient declined    Marital Status: Patient declined  Intimate Partner Violence: Not At Risk (11/02/2024)   Received from [facility]   Epic    Within the last year, have you been afraid of your partner or ex-partner?: No    Within the last year, have you been humiliated or emotionally abused in other ways by your partner or ex-partner?: No    Within the last year, have you been kicked, hit, slapped, or otherwise physically hurt by your partner or ex-partner?: No    Within the last year, have you been raped or forced to have any kind of sexual activity by your partner or ex-partner?: No  Depression (PHQ2-9): Low Risk (09/16/2024)   Depression (PHQ2-9)    PHQ-2 Score: 0  Alcohol Screen: Not on file  Housing: Patient Declined (08/21/2024)   Epic    Unable to Pay for Housing in the Last Year: Patient declined    Number of Times Moved in the Last Year: Not on file    Homeless in the Last Year: Patient declined  Recent Concern: Housing - High Risk (06/29/2024)   Epic     Unable to Pay for Housing in the Last Year: Yes    Number of Times Moved in the Last Year: 0    Homeless in the Last Year: No  Utilities: Patient Declined (08/21/2024)   Epic    Threatened with loss of utilities: Patient declined  Health Literacy: Medium Risk (09/03/2023)   Received from [facility]    How often do you need to have someone help you when you read instructions, pamphlets, or other written material from your doctor or pharmacy?: Sometimes    Family History  Problem Relation Age of Onset   Hypertension Mother    Kidney disease Sister    Kidney disease Brother  Heart disease Brother    Heart disease Brother    Breast cancer Neg Hx     Current Medications[2]  Physical exam:  Vitals:   11/08/24 1013  BP: (!) 118/47  Pulse: (!) 150  Resp: 20  Temp: 98.6 F (37 C)  SpO2: 100%  Weight: 237 lb 12.8 oz (107.9 kg)   Physical Exam Constitutional:      Comments: Sitting in a wheelchair.  Appears fatigued  Cardiovascular:     Rate and Rhythm: Normal rate and regular rhythm.     Heart sounds: Normal heart sounds.  Pulmonary:     Effort: Pulmonary effort is normal.     Breath sounds: Normal breath sounds.  Abdominal:     General: Bowel sounds are normal.     Palpations: Abdomen is soft.     Comments: J tube in place  Skin:    General: Skin is warm and dry.  Neurological:     Mental Status: She is alert and oriented to person, place, and time.      I have personally reviewed labs listed below:    Latest Ref Rng & Units 11/08/2024    9:47 AM  CMP  Glucose 70 - 99 mg/dL 815   BUN 8 - 23 mg/dL 29   Creatinine 9.55 - 1.00 mg/dL 8.39   Sodium 864 - 854 mmol/L 142   Potassium 3.5 - 5.1 mmol/L 4.4   Chloride 98 - 111 mmol/L 107   CO2 22 - 32 mmol/L 23   Calcium  8.9 - 10.3 mg/dL 9.0   Total Protein 6.5 - 8.1 g/dL 7.1   Total Bilirubin 0.0 - 1.2 mg/dL 0.4   Alkaline Phos 38 - 126 U/L 159   AST 15 - 41 U/L 16   ALT 0 - 44 U/L 9        Latest Ref Rng & Units 11/08/2024    9:47 AM  CBC  WBC 4.0 - 10.5 K/uL 20.0   Hemoglobin 12.0 - 15.0 g/dL 7.5   Hematocrit 63.9 - 46.0 % 23.6   Platelets 150 - 400 K/uL 330    I have personally reviewed Radiology images listed below: No images are attached to the encounter.  CT Chest Wo Contrast Result Date: 11/06/2024 CLINICAL DATA:  Renal cell carcinoma and pancreatic mass. * Tracking Code: BO * EXAM: CT CHEST WITHOUT CONTRAST TECHNIQUE: Multidetector CT imaging of the chest was performed following the standard protocol without IV contrast. RADIATION DOSE REDUCTION: This exam was performed according to the departmental dose-optimization program which includes automated exposure control, adjustment of the mA and/or kV according to patient size and/or use of iterative reconstruction technique. COMPARISON:  03/28/2024 CTA chest FINDINGS: Cardiovascular: Aortic atherosclerosis. Mild cardiomegaly, without pericardial effusion. Left circumflex and proximal LAD coronary artery calcification. Mediastinum/Nodes: No supraclavicular adenopathy. Left-sided thyroid  enlargement and heterogeneity is similar with suggestion of a 4.3 cm mass on image 12/2. No mediastinal or hilar adenopathy, given limitations of unenhanced CT. Upper esophageal fluid level on image 40/12. Lungs/Pleura: No pleural fluid. 4 mm left upper lobe nodule on image 49/4 is similar to the prior, favored to be benign. No enlarging or suspicious pulmonary nodules identified. Upper Abdomen: Detailed on MRI, same date dictated separately. Musculoskeletal: No acute osseous abnormality. IMPRESSION: 1.  No acute process or evidence of metastatic disease in the chest. 2. Left-sided thyroid  heterogeneity and enlargement, suggesting an underlying nodule. In the setting of significant comorbidities or limited life expectancy, no follow-up recommended (ref: J Am  Coll Radiol. 2015 Feb;12(2): 143-50). 3. Esophageal air fluid level suggests dysmotility or  gastroesophageal reflux. 4. Coronary artery atherosclerosis. Aortic Atherosclerosis (ICD10-I70.0). 5. Abdominal MRI of same date is dictated separately. Electronically Signed   By: Rockey Kilts M.D.   On: 11/06/2024 19:54   MR ABDOMEN W WO CONTRAST Result Date: 11/01/2024 CLINICAL DATA:  renal cell carcinoma / pancreatic mass; renal cell carcinoma / pancreatic mass renal cell carcinoma / pancreatic mass EXAM: MRI ABDOMEN WITHOUT AND WITH CONTRAST TECHNIQUE: Multiplanar multisequence MR imaging of the abdomen was performed both before and after the administration of intravenous contrast. CONTRAST:  10mL GADAVIST  GADOBUTROL  1 MMOL/ML IV SOLN COMPARISON:  CT scan abdomen and pelvis from 09/18/2024. FINDINGS: Lower chest: Unremarkable MR appearance to the lung bases. No pleural effusion. No pericardial effusion. Normal heart size. Hepatobiliary: The liver is normal in size. Noncirrhotic configuration. This scattered sub 5 mm simple cysts in the liver. No suspicious liver lesion. Metallic stent noted in the extrahepatic bile duct. There is slight dilation of intrahepatic bile duct in the left hepatic lobe, segment 2. Remaining intrahepatic bile ducts are nondilated. Status post cholecystectomy. Pancreas: There is a heterogeneous, T2 slightly hyperintense approximately 4.3 x 5.1 cm lesion centered in the pancreatic head/uncinate process. There is resultant abrupt cutoff of the main pancreatic duct at the level of neck. No traversing pancreatic duct noted in the head. The pancreatic duct in the body/tail is dilated measuring up to 5-6 mm. The postcontrast images are significantly limited due to extensive motion. However, the mass is inseparable from the main portal vein, splenic vein and superior mesenteric vein near the confluence which exhibit marked narrowing. Spleen:  Within normal limits in size and appearance. No focal mass. Adrenals/Urinary Tract: Unremarkable adrenal glands. No hydroureteronephrosis. There is a  heterogeneous 8.3 x 10.0 x 10.7 cm exophytic mass arising from the right kidney lower pole. Right renal vein is patent. However, the mass extends beyond the renal capsule. No significant perirenal lymphadenopathy identified. This is compatible with renal cell carcinoma. There multiple simple cysts in left kidney as well as a simple cyst in the right kidney upper pole. No suspicious left renal mass. Stomach/Bowel: There is a small sliding hiatal hernia. Visualized portions within the abdomen are unremarkable. No disproportionate dilation of bowel loops. Vascular/Lymphatic: No pathologically enlarged lymph nodes identified. No abdominal aortic aneurysm demonstrated. No ascites. Other:  Left mid abdomen feeding jejunostomy noted. Musculoskeletal: No suspicious bone lesions identified. IMPRESSION: 1. There is a 4.3 x 5.1 cm heterogeneous mass centered in the pancreatic head/uncinate process, which is inseparable from the main portal vein, splenic vein and superior mesenteric vein near the confluence which exhibit marked narrowing. This is compatible with pancreatic adenocarcinoma. 2. There is a 8.3 x 10.0 x 10.7 cm exophytic mass arising from the right kidney lower pole, compatible with renal cell carcinoma. 3. Metallic stent in the extrahepatic bile duct. There is slight dilation of intrahepatic bile duct in the left hepatic lobe, segment 2. Remaining intrahepatic bile ducts are nondilated. 4. Multiple other nonacute observations, as described above. Electronically Signed   By: Ree Molt M.D.   On: 11/01/2024 16:26   MR 3D Recon At Scanner Result Date: 11/01/2024 CLINICAL DATA:  renal cell carcinoma / pancreatic mass; renal cell carcinoma / pancreatic mass renal cell carcinoma / pancreatic mass EXAM: MRI ABDOMEN WITHOUT AND WITH CONTRAST TECHNIQUE: Multiplanar multisequence MR imaging of the abdomen was performed both before and after the administration of intravenous contrast. CONTRAST:  10mL  GADAVIST   GADOBUTROL  1 MMOL/ML IV SOLN COMPARISON:  CT scan abdomen and pelvis from 09/18/2024. FINDINGS: Lower chest: Unremarkable MR appearance to the lung bases. No pleural effusion. No pericardial effusion. Normal heart size. Hepatobiliary: The liver is normal in size. Noncirrhotic configuration. This scattered sub 5 mm simple cysts in the liver. No suspicious liver lesion. Metallic stent noted in the extrahepatic bile duct. There is slight dilation of intrahepatic bile duct in the left hepatic lobe, segment 2. Remaining intrahepatic bile ducts are nondilated. Status post cholecystectomy. Pancreas: There is a heterogeneous, T2 slightly hyperintense approximately 4.3 x 5.1 cm lesion centered in the pancreatic head/uncinate process. There is resultant abrupt cutoff of the main pancreatic duct at the level of neck. No traversing pancreatic duct noted in the head. The pancreatic duct in the body/tail is dilated measuring up to 5-6 mm. The postcontrast images are significantly limited due to extensive motion. However, the mass is inseparable from the main portal vein, splenic vein and superior mesenteric vein near the confluence which exhibit marked narrowing. Spleen:  Within normal limits in size and appearance. No focal mass. Adrenals/Urinary Tract: Unremarkable adrenal glands. No hydroureteronephrosis. There is a heterogeneous 8.3 x 10.0 x 10.7 cm exophytic mass arising from the right kidney lower pole. Right renal vein is patent. However, the mass extends beyond the renal capsule. No significant perirenal lymphadenopathy identified. This is compatible with renal cell carcinoma. There multiple simple cysts in left kidney as well as a simple cyst in the right kidney upper pole. No suspicious left renal mass. Stomach/Bowel: There is a small sliding hiatal hernia. Visualized portions within the abdomen are unremarkable. No disproportionate dilation of bowel loops. Vascular/Lymphatic: No pathologically enlarged lymph nodes  identified. No abdominal aortic aneurysm demonstrated. No ascites. Other:  Left mid abdomen feeding jejunostomy noted. Musculoskeletal: No suspicious bone lesions identified. IMPRESSION: 1. There is a 4.3 x 5.1 cm heterogeneous mass centered in the pancreatic head/uncinate process, which is inseparable from the main portal vein, splenic vein and superior mesenteric vein near the confluence which exhibit marked narrowing. This is compatible with pancreatic adenocarcinoma. 2. There is a 8.3 x 10.0 x 10.7 cm exophytic mass arising from the right kidney lower pole, compatible with renal cell carcinoma. 3. Metallic stent in the extrahepatic bile duct. There is slight dilation of intrahepatic bile duct in the left hepatic lobe, segment 2. Remaining intrahepatic bile ducts are nondilated. 4. Multiple other nonacute observations, as described above. Electronically Signed   By: Ree Molt M.D.   On: 11/01/2024 16:26     Assessment and plan- Patient is a 69 y.o. female here for routine follow-up of following issues:  Assessment and Plan    Pancreatic cancer with duodenal obstruction, on hospice care - Patient has had known pancreatic mass for which she underwent biopsy once in the past and that was nondiagnostic.  Patient subsequently  underwent EUS at [facility] on 11/02/2024 which showed a 4.5 cm poorly differentiated pancreatic head mass which was biopsied and was consistent with poorly differentiated adenocarcinoma.  During that time she had a duodenal stent placed.  She also had a EUS guided gastrojejunostomy performed given gastric outlet obstruction.  She had a GJ stent placed.  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 Not suitable for surgery or chemotherapy.  Patient also does not wish to undergo palliative chemotherapy at this time  _ we will confirm with [facility] that she is enrolled under hospice care. - Coordinated with palliative care and  hospice for  supportive measures. - Patient had EUS guided gastrojejunostomy procedure done at the time of EUS and she also has a J-tube for feeding purposes which she will continue to use- \ Reviewed potential duodenal stent placement for palliation. - Focused on symptom management through hospice services. - Avoided hospitalizations, lab tests, or aggressive interventions. - Maintained communication with hospice for ongoing needs.  Renal mass (presumed kidney cancer) Large renal mass consistent with kidney cancer, chronic comorbidity with advanced pancreatic cancer. No acute interventions due to prognosis and hospice care. - No diagnostic or therapeutic interventions for renal mass due to hospice focus on comfort.  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 Transfusion discussed but not aligned with care goals. - No lab evaluation or transfusions planned per hospice approach. - Manage anemia symptoms through hospice supportive measures.         Visit Diagnosis 1. Pancreatic adenocarcinoma (HCC)   2. Renal cell carcinoma of right kidney [facility])      Dr. Annah Skene, MD, MPH [facility], [facility], [facility] [phone] 11/08/2024 2:58 PM                   [1]  Allergies Allergen Reactions   Latex Other (See Comments), Dermatitis, Hives and Swelling    unknown  Other reaction(s): Other (See Comments)  unknown  Other reaction(s): Other (See Comments) unknown    unknown   Aspirin Other (See Comments) and Nausea And Vomiting    Makes stomach burn  Other reaction(s): Other (See Comments)  Makes stomach burn  Makes stomach burn    Other reaction(s): Other (See Comments) Makes stomach burn   Plasticized Base [Plastibase]   [2]  Current Outpatient Medications:    albuterol  (PROVENTIL ) (2.5 MG/3ML) 0.083% nebulizer solution, Take 2.5 mg by nebulization every 4 (four) hours as needed for  wheezing or shortness of breath., Disp: , Rfl:    apixaban  (ELIQUIS ) 5 MG TABS tablet, Take 1 tablet (5 mg total) by mouth 2 (two) times daily., Disp: 60 tablet, Rfl: 3   dibucaine (NUPERCAINAL) 1 % OINT, Place 1 Application rectally as needed for hemorrhoids., Disp: , Rfl:    dorzolamide  (TRUSOPT ) 2 % ophthalmic solution, Place 1 drop into both eyes 2 (two) times daily., Disp: , Rfl:    famotidine  (PEPCID ) 20 MG tablet, Place 1 tablet (20 mg total) into feeding tube 3 times/day as needed-between meals & bedtime for heartburn or indigestion., Disp: 90 tablet, Rfl: 0   fiber supplement, BANATROL TF, liquid, Place 60 mLs into feeding tube 2 (two) times daily., Disp: 60 mL, Rfl: 0   folic acid  (FOLVITE ) 1 MG tablet, Place 1 tablet (1 mg total) into feeding tube daily., Disp: 30 tablet, Rfl: 0   metoprolol  tartrate (LOPRESSOR ) 25 MG tablet, Place 1 tablet (25 mg total) into feeding tube 2 (two) times daily., Disp: 60 tablet, Rfl: 0   Nutritional Supplements (FEEDING SUPPLEMENT, OSMOLITE 1.5 CAL,) LIQD, 1,350 mLs by Per J Tube route daily., Disp: , Rfl:    OLANZapine  (ZYPREXA ) 10 MG tablet, Place 1 tablet (10 mg total) into feeding tube at bedtime., Disp: 30 tablet, Rfl: 0   oxyCODONE  (OXY IR/ROXICODONE ) 5 MG immediate release tablet, Take 5 mg by mouth every 8 (eight) hours as needed for moderate pain (pain score 4-6) or severe pain (pain score 7-10)., Disp: , Rfl:    sodium chloride  flush 0.9 % SOLN injection, Flush biliary cathter with  5-69mL saline daily to prevent clogging, Disp: 300 mL, Rfl: 1   Multiple Vitamin (MULTIVITAMIN WITH MINERALS) TABS tablet, Place 1 tablet into feeding tube daily. (Patient not taking: Reported on 11/08/2024), Disp: 30 tablet, Rfl: 0   Multiple Vitamins-Minerals (CERTAVITE/ANTIOXIDANTS) TABS, 1 tablet into feeding tube Orally daily (Patient not taking: Reported on 11/08/2024), Disp: , Rfl:   "

## 2024-11-09 ENCOUNTER — Inpatient Hospital Stay

## 2024-11-09 LAB — CANCER ANTIGEN 19-9: CA 19-9: 8516 U/mL — ABNORMAL HIGH (ref 0–35)
